# Patient Record
Sex: Male | Born: 1945 | Race: White | Hispanic: No | Marital: Married | Smoking: Never smoker
Health system: Southern US, Community
[De-identification: ages and names within clinical notes are randomized; demographics above are authoritative.]

## PROBLEM LIST (undated history)

## (undated) DIAGNOSIS — J189 Pneumonia, unspecified organism: Secondary | ICD-10-CM

## (undated) DIAGNOSIS — C4491 Basal cell carcinoma of skin, unspecified: Secondary | ICD-10-CM

## (undated) DIAGNOSIS — I5030 Unspecified diastolic (congestive) heart failure: Secondary | ICD-10-CM

## (undated) DIAGNOSIS — E785 Hyperlipidemia, unspecified: Secondary | ICD-10-CM

## (undated) DIAGNOSIS — I10 Essential (primary) hypertension: Secondary | ICD-10-CM

## (undated) DIAGNOSIS — L570 Actinic keratosis: Secondary | ICD-10-CM

## (undated) DIAGNOSIS — M1990 Unspecified osteoarthritis, unspecified site: Secondary | ICD-10-CM

## (undated) DIAGNOSIS — D126 Benign neoplasm of colon, unspecified: Secondary | ICD-10-CM

## (undated) DIAGNOSIS — J9691 Respiratory failure, unspecified with hypoxia: Secondary | ICD-10-CM

## (undated) DIAGNOSIS — R7303 Prediabetes: Secondary | ICD-10-CM

## (undated) DIAGNOSIS — J849 Interstitial pulmonary disease, unspecified: Secondary | ICD-10-CM

## (undated) DIAGNOSIS — I2510 Atherosclerotic heart disease of native coronary artery without angina pectoris: Secondary | ICD-10-CM

## (undated) HISTORY — DX: Hyperlipidemia, unspecified: E78.5

## (undated) HISTORY — PX: OTHER SURGICAL HISTORY: SHX169

## (undated) HISTORY — DX: Interstitial pulmonary disease, unspecified: J84.9

## (undated) HISTORY — PX: CARDIAC CATHETERIZATION: SHX172

## (undated) HISTORY — DX: Basal cell carcinoma of skin, unspecified: C44.91

## (undated) HISTORY — DX: Atherosclerotic heart disease of native coronary artery without angina pectoris: I25.10

## (undated) HISTORY — PX: CORONARY ARTERY BYPASS GRAFT: SHX141

## (undated) HISTORY — DX: Unspecified diastolic (congestive) heart failure: I50.30

## (undated) HISTORY — PX: APPENDECTOMY: SHX54

## (undated) HISTORY — DX: Actinic keratosis: L57.0

## (undated) HISTORY — PX: COLONOSCOPY: SHX174

---

## 2004-10-17 ENCOUNTER — Ambulatory Visit: Payer: Self-pay | Admitting: Unknown Physician Specialty

## 2008-11-29 ENCOUNTER — Ambulatory Visit: Payer: Self-pay | Admitting: Cardiology

## 2010-01-26 ENCOUNTER — Ambulatory Visit: Payer: Self-pay | Admitting: Unknown Physician Specialty

## 2010-03-13 ENCOUNTER — Ambulatory Visit: Payer: Self-pay | Admitting: Unknown Physician Specialty

## 2010-03-15 LAB — PATHOLOGY REPORT

## 2011-09-04 ENCOUNTER — Ambulatory Visit: Payer: Self-pay | Admitting: Internal Medicine

## 2011-09-20 ENCOUNTER — Ambulatory Visit: Payer: Self-pay | Admitting: General Surgery

## 2011-09-20 LAB — CBC WITH DIFFERENTIAL/PLATELET
Basophil #: 0.1 10*3/uL (ref 0.0–0.1)
Basophil %: 1 %
Eosinophil #: 0.2 10*3/uL (ref 0.0–0.7)
Eosinophil %: 4.4 %
HCT: 41.1 % (ref 40.0–52.0)
HGB: 14.4 g/dL (ref 13.0–18.0)
Lymphocyte #: 1.9 10*3/uL (ref 1.0–3.6)
Lymphocyte %: 36.2 %
MCH: 34.8 pg — ABNORMAL HIGH (ref 26.0–34.0)
MCHC: 35.2 g/dL (ref 32.0–36.0)
MCV: 99 fL (ref 80–100)
Monocyte #: 0.4 x10 3/mm (ref 0.2–1.0)
Monocyte %: 8.7 %
Neutrophil #: 2.6 10*3/uL (ref 1.4–6.5)
Neutrophil %: 49.7 %
Platelet: 135 10*3/uL — ABNORMAL LOW (ref 150–440)
RBC: 4.14 10*6/uL — ABNORMAL LOW (ref 4.40–5.90)
RDW: 12.9 % (ref 11.5–14.5)
WBC: 5.1 10*3/uL (ref 3.8–10.6)

## 2011-09-20 LAB — BASIC METABOLIC PANEL
Anion Gap: 7 (ref 7–16)
BUN: 12 mg/dL (ref 7–18)
Calcium, Total: 8.8 mg/dL (ref 8.5–10.1)
Chloride: 107 mmol/L (ref 98–107)
Co2: 28 mmol/L (ref 21–32)
Creatinine: 0.99 mg/dL (ref 0.60–1.30)
EGFR (African American): >60
EGFR (Non-African Amer.): >60
Glucose: 119 mg/dL — ABNORMAL HIGH (ref 65–99)
Osmolality: 284 (ref 275–301)
Potassium: 3.8 mmol/L (ref 3.5–5.1)
Sodium: 142 mmol/L (ref 136–145)

## 2011-09-24 ENCOUNTER — Ambulatory Visit: Payer: Self-pay | Admitting: General Surgery

## 2011-10-02 LAB — PATHOLOGY REPORT

## 2013-07-07 ENCOUNTER — Ambulatory Visit: Payer: Self-pay | Admitting: Unknown Physician Specialty

## 2013-07-10 LAB — PATHOLOGY REPORT

## 2014-06-27 DIAGNOSIS — Z85828 Personal history of other malignant neoplasm of skin: Secondary | ICD-10-CM | POA: Insufficient documentation

## 2014-09-04 NOTE — Op Note (Signed)
PATIENT NAME:  Garrett Peterson, Garrett Peterson MR#:  [id number] DATE OF BIRTH:  1945/06/29  DATE OF PROCEDURE:  09/24/2011  PREOPERATIVE DIAGNOSIS: Chronic cholecystitis and cholelithiasis.   POSTOPERATIVE DIAGNOSIS: Chronic cholecystitis and cholelithiasis   OPERATIVE PROCEDURE: Laparoscopic cholecystectomy with intraoperative cholangiograms.   SURGEON: Hervey Ard, MD    ANESTHESIA: General endotracheal.   ANESTHESIOLOGIST: Gunnar Bulla, MD   ESTIMATED BLOOD LOSS: Less than 5 mL.   CLINICAL NOTE: This 69 year old male has had symptomatic upper abdominal pain with ultrasound showing evidence of cholelithiasis. He was admitted for elective cholecystectomy.   OPERATIVE NOTE: With the patient under adequate general endotracheal anesthesia, the abdomen was prepped with ChloraPrep and draped. Hair had previously been removed with clippers. The patient was placed in Trendelenburg position. A transumbilical incision was made and a Veress needle placed. Although the hanging drop test was appropriate, initial insufflation with CO2 at 10 mmHg pressure showed insufflation in the preperitoneal space with distention of the lower abdomen but not of the upper abdomen. The needle was repositioned several times, always aspirating prior to instillation of CO2, and finally pneumoperitoneum was established. The patient was placed in reverse Trendelenburg position and rolled to the left. An 11 mm Xcel port was placed in the epigastrium and the camera moved to this location. There was no evidence of injury from initial port placement. Two 5-mm step ports were placed laterally. The gallbladder showed moderate inflammation with adhesions to the surrounding omentum. The gallbladder was placed on cephalad traction and the adhesions taken down with cautery dissection. There was a band of adhesions extending from the omentum across the inferior surface of the right lobe of the liver, and these were also taken down to remove the  weight of the omentum from the gallbladder. The liver was placed on cephalad traction with the gallbladder and the neck of the gallbladder exposed. A small hole in the gallbladder itself was made with the grasper, and this was controlled with a clip. The Kumar clamp was placed and fluoroscopic cholangiograms completed. The initial injection of 20 mL of one-half strength Conray 60 showed flow only into the common bile duct. The patient was rolled to the right and reinjected and showed good flow into the common hepatic duct. The distal common bile duct tapered normally, and a small amount of flow was noted into the duodenum. The cystic duct and cystic artery branches were doubly clipped and divided. The gallbladder was removed from the liver bed making use of hook cautery dissection. It was then placed through an EndoCatch bag for delivery through the umbilical port site. In spite of the fact that the EndoCatch bag had been brought through the port, when the bag was going to be retrieved the cord did not follow the same pathway the port did. It was necessary to place a Kelly through the defect, re-grab the cord on the EndoCatch bag, and then bring this through the abdominal wall. The gallbladder was sent to pathology in formalin for routine exam. Inspection from the epigastric port again showed no evidence of injury from initial port placement or gallbladder removal. Pneumoperitoneum was re-established. The right upper quadrant was irrigated with lactated Ringer's solution. Good hemostasis was noted. The abdomen was then desufflated and ports removed under direct vision. The skin incisions were closed with 4-0 Vicryl subcuticular sutures. Benzoin, Steri-Strips, Telfa, and Tegaderm dressings were applied.   The patient tolerated the procedure well and was taken to the recovery room in stable condition.  ____________________________ Forest Gleason.  Bary Castilla, MD jwb:cbb D: 09/24/2011 17:38:22 ET T: 09/24/2011 18:34:23  ET JOB#: [id number]  cc: Robert Bellow, MD, <Dictator> Leona Carry. Hall Busing, MD Larena Ohnemus Amedeo Kinsman MD ELECTRONICALLY SIGNED 09/25/2011 7:51

## 2015-01-09 ENCOUNTER — Ambulatory Visit (INDEPENDENT_AMBULATORY_CARE_PROVIDER_SITE_OTHER): Payer: Medicare Other

## 2015-01-09 ENCOUNTER — Ambulatory Visit (INDEPENDENT_AMBULATORY_CARE_PROVIDER_SITE_OTHER): Payer: Medicare Other | Admitting: Podiatry

## 2015-01-09 ENCOUNTER — Encounter: Payer: Self-pay | Admitting: Podiatry

## 2015-01-09 VITALS — BP 180/95 | HR 57 | Resp 16

## 2015-01-09 DIAGNOSIS — M79673 Pain in unspecified foot: Secondary | ICD-10-CM

## 2015-01-09 DIAGNOSIS — M76821 Posterior tibial tendinitis, right leg: Secondary | ICD-10-CM

## 2015-01-09 DIAGNOSIS — M7750 Other enthesopathy of unspecified foot: Secondary | ICD-10-CM | POA: Diagnosis not present

## 2015-01-09 DIAGNOSIS — M779 Enthesopathy, unspecified: Secondary | ICD-10-CM

## 2015-01-09 DIAGNOSIS — M778 Other enthesopathies, not elsewhere classified: Secondary | ICD-10-CM

## 2015-01-09 MED ORDER — MELOXICAM 15 MG PO TABS: 15.0000 mg | ORAL_TABLET | Freq: Every day | ORAL | Status: DC

## 2015-01-09 MED ORDER — METHYLPREDNISOLONE 4 MG PO TBPK: ORAL_TABLET | ORAL | Status: DC

## 2015-01-09 NOTE — Progress Notes (Signed)
   Subjective:    Patient ID: Garrett Peterson, male    DOB: 08-12-1945, 69 y.o.   MRN: [id number]  HPI: He presents today with a chief complaint of painful first metatarsophalangeal joints. He states this been going on for quite a while now and he is tolerated. He states that it just seems to be getting worse to the point where I can barely stand it any longer. He states that the pain is dull and aching in nature and he also has ankle pain most recently that he associates with pain in the big toe right foot. Denies any trauma to the foot.    Review of Systems  Cardiovascular: Positive for leg swelling.  Musculoskeletal: Positive for arthralgias.  All other systems reviewed and are negative.      Objective:   Physical Exam: 69 year old white male well-nourished in no acute distress presents vital signs are stable alert and oriented 3. Pulses are strongly palpable on neurologic sensorium is intact for [employer] monofilament. Deep tendon reflexes are intact bilateral and muscle strength +5 over 5 dorsiflexion plantar flexors and inverters everters all intrinsic musculature is intact. Orthopedic evaluation demonstrates all joints distal to the ankle have a full range of motion without crepitation. He does have some tenderness on range of motion of the first metatarsophalangeal joint of the right foot and some mild tenderness on inversion against resistance along the posterior tibial tendon of the right foot. He has no tenderness on palpation of the medial calcaneal tubercles bilateral. 3 views of radiograph taken today in the office demonstrate elongated first metatarsals with slightly narrowing of the joint space is consistent with early osteoarthritic changes. Cutaneous evaluation of a straight supple well-hydrated cutis no erythema edema cellulitis drainage or odor.         Assessment & Plan:  Assessment: Posterior tibial tendinitis right ankle secondary to accommodation from capsulitis  first metatarsophalangeal joint bilateral.  Plan: Discussed etiology pathology conservative versus surgical therapies. We discussed appropriate shoe gear stretching exercises ice therapy and history are modifications. After a sterile Betadine skin prep I injected 2 mg of dexamethasone with local and aesthetic into the first metatarsophalangeal joint bilaterally. I also started him on a Medrol Dosepak to be followed by meloxicam. I will follow-up with him in 1 month

## 2015-02-06 ENCOUNTER — Encounter: Payer: Self-pay | Admitting: Podiatry

## 2015-02-06 ENCOUNTER — Ambulatory Visit (INDEPENDENT_AMBULATORY_CARE_PROVIDER_SITE_OTHER): Payer: Medicare Other | Admitting: Podiatry

## 2015-02-06 VITALS — BP 166/85 | HR 68 | Resp 16

## 2015-02-06 DIAGNOSIS — M76821 Posterior tibial tendinitis, right leg: Secondary | ICD-10-CM | POA: Diagnosis not present

## 2015-02-06 DIAGNOSIS — M779 Enthesopathy, unspecified: Principal | ICD-10-CM

## 2015-02-06 DIAGNOSIS — M7750 Other enthesopathy of unspecified foot: Secondary | ICD-10-CM | POA: Diagnosis not present

## 2015-02-06 DIAGNOSIS — M778 Other enthesopathies, not elsewhere classified: Secondary | ICD-10-CM

## 2015-02-06 MED ORDER — DICLOFENAC SODIUM 75 MG PO TBEC: 75.0000 mg | DELAYED_RELEASE_TABLET | Freq: Two times a day (BID) | ORAL | Status: DC

## 2015-02-06 NOTE — Progress Notes (Signed)
He presents today with chief complaint of pain to the first metatarsophalangeal joint of the right foot greater than the left. He states that after the last injection lasted for 4-5 days but then it started to come back. He states that really bothers me when I'm driving or squatting. He is requesting another injection if possible.  Objective: Vital signs are stable he is alert and oriented 3 pulses are strongly palpable. Neurologic sensorium is intact. Deep tendon reflexes are brisk and equal bilateral and muscle strength is normal bilateral. Orthopedic evaluation does demonstrate pain on palpation and range of motion of the first metatarsophalangeal joint right foot over the left foot including the sesamoidal apparatus and the plantar structures. I reviewed his old radiographs particularly of the right foot today which does demonstrate early joint space narrowing consistent with very early osteoarthritic change.  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  Plan: Discussed the etiology pathology conservative versus surgical therapies. At this point I explained to him that we could inject another dose of Kenalog bilaterally. This was performed today after both of the first metatarsophalangeal joints were prepped with Betadine. He tolerated this procedure well and alleviated his symptoms prior to him leaving the office. I will follow-up with him in 1 month if necessary. We may need to consider an MRI.

## 2015-03-20 ENCOUNTER — Encounter: Payer: Self-pay | Admitting: Podiatry

## 2015-03-20 ENCOUNTER — Ambulatory Visit (INDEPENDENT_AMBULATORY_CARE_PROVIDER_SITE_OTHER): Payer: Medicare Other | Admitting: Podiatry

## 2015-03-20 VITALS — BP 164/88 | HR 66 | Resp 12

## 2015-03-20 DIAGNOSIS — G629 Polyneuropathy, unspecified: Secondary | ICD-10-CM

## 2015-03-20 MED ORDER — GABAPENTIN 100 MG PO CAPS: ORAL_CAPSULE | ORAL | Status: DC

## 2015-03-20 NOTE — Progress Notes (Signed)
He presents today stating that he has had some relief from the injections. He states this seems to help for the first to 3 weeks in the nothing at all. he states that the majority of the pain starts mid afternoon to late evening but does not prevent him from sleeping and does not wake him up. He states it is a dull ache no shooting pains no burning and tingling.  Objective: Pulses are strongly palpable neurologic sensorium appears to be intact per Semmes-Weinstein monofilament. Deep tendon reflexes are equal bilateral muscle strength is normal.  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  Plan: We will consider orthotics next visit however I started him on 100 mg of gabapentin twice daily and I will follow-up with him in 6 weeks.  Roselind Messier DPM

## 2015-05-01 ENCOUNTER — Ambulatory Visit: Payer: Medicare Other | Admitting: Podiatry

## 2018-07-15 DIAGNOSIS — I2510 Atherosclerotic heart disease of native coronary artery without angina pectoris: Secondary | ICD-10-CM | POA: Insufficient documentation

## 2018-07-15 DIAGNOSIS — E785 Hyperlipidemia, unspecified: Secondary | ICD-10-CM | POA: Insufficient documentation

## 2018-07-15 DIAGNOSIS — Z86010 Personal history of colonic polyps: Secondary | ICD-10-CM | POA: Insufficient documentation

## 2018-09-16 ENCOUNTER — Ambulatory Visit: Admit: 2018-09-16 | Payer: Self-pay | Admitting: Unknown Physician Specialty

## 2018-09-16 SURGERY — COLONOSCOPY WITH PROPOFOL
Anesthesia: General

## 2019-05-14 DIAGNOSIS — C801 Malignant (primary) neoplasm, unspecified: Secondary | ICD-10-CM

## 2019-05-14 HISTORY — DX: Malignant (primary) neoplasm, unspecified: C80.1

## 2019-09-03 ENCOUNTER — Other Ambulatory Visit: Payer: Self-pay | Admitting: Gastroenterology

## 2019-09-03 ENCOUNTER — Other Ambulatory Visit: Payer: Self-pay | Admitting: Internal Medicine

## 2019-09-03 ENCOUNTER — Encounter: Payer: Self-pay | Admitting: Oncology

## 2019-09-03 DIAGNOSIS — C187 Malignant neoplasm of sigmoid colon: Secondary | ICD-10-CM

## 2019-09-03 DIAGNOSIS — K6389 Other specified diseases of intestine: Secondary | ICD-10-CM

## 2019-09-03 NOTE — Progress Notes (Signed)
Patient prescreened for new pt appt. No concerns voiced.

## 2019-09-06 ENCOUNTER — Encounter (INDEPENDENT_AMBULATORY_CARE_PROVIDER_SITE_OTHER): Payer: Self-pay

## 2019-09-06 ENCOUNTER — Other Ambulatory Visit: Payer: Self-pay

## 2019-09-06 ENCOUNTER — Ambulatory Visit
Admission: RE | Admit: 2019-09-06 | Discharge: 2019-09-06 | Disposition: A | Discharge status: Home / Self Care | Payer: Medicare Other | Source: Ambulatory Visit | Attending: Gastroenterology | Admitting: Gastroenterology

## 2019-09-06 ENCOUNTER — Inpatient Hospital Stay: Payer: Medicare Other

## 2019-09-06 ENCOUNTER — Encounter: Payer: Self-pay | Admitting: Oncology

## 2019-09-06 ENCOUNTER — Inpatient Hospital Stay: Payer: Medicare Other | Attending: Oncology | Admitting: Oncology

## 2019-09-06 VITALS — BP 172/86 | HR 58 | Temp 96.2°F | Resp 18 | Ht 67.52 in | Wt 226.1 lb

## 2019-09-06 DIAGNOSIS — Z8719 Personal history of other diseases of the digestive system: Secondary | ICD-10-CM | POA: Diagnosis not present

## 2019-09-06 DIAGNOSIS — R918 Other nonspecific abnormal finding of lung field: Secondary | ICD-10-CM | POA: Insufficient documentation

## 2019-09-06 DIAGNOSIS — C187 Malignant neoplasm of sigmoid colon: Secondary | ICD-10-CM | POA: Diagnosis not present

## 2019-09-06 DIAGNOSIS — K5900 Constipation, unspecified: Secondary | ICD-10-CM

## 2019-09-06 DIAGNOSIS — N281 Cyst of kidney, acquired: Secondary | ICD-10-CM | POA: Diagnosis not present

## 2019-09-06 DIAGNOSIS — K6389 Other specified diseases of intestine: Secondary | ICD-10-CM

## 2019-09-06 LAB — CBC WITH DIFFERENTIAL/PLATELET
Abs Immature Granulocytes: 0.02 10*3/uL (ref 0.00–0.07)
Basophils Absolute: 0.1 10*3/uL (ref 0.0–0.1)
Basophils Relative: 1 %
Eosinophils Absolute: 0.4 10*3/uL (ref 0.0–0.5)
Eosinophils Relative: 5 %
HCT: 42.4 % (ref 39.0–52.0)
Hemoglobin: 14.9 g/dL (ref 13.0–17.0)
Immature Granulocytes: 0 %
Lymphocytes Relative: 25 %
Lymphs Abs: 2.1 10*3/uL (ref 0.7–4.0)
MCH: 33.5 pg (ref 26.0–34.0)
MCHC: 35.1 g/dL (ref 30.0–36.0)
MCV: 95.3 fL (ref 80.0–100.0)
Monocytes Absolute: 0.9 10*3/uL (ref 0.1–1.0)
Monocytes Relative: 10 %
Neutro Abs: 5 10*3/uL (ref 1.7–7.7)
Neutrophils Relative %: 59 %
Platelets: 194 10*3/uL (ref 150–400)
RBC: 4.45 MIL/uL (ref 4.22–5.81)
RDW: 12.1 % (ref 11.5–15.5)
WBC: 8.5 10*3/uL (ref 4.0–10.5)
nRBC: 0 % (ref 0.0–0.2)

## 2019-09-06 LAB — COMPREHENSIVE METABOLIC PANEL
ALT: 26 U/L (ref 0–44)
AST: 23 U/L (ref 15–41)
Albumin: 4.4 g/dL (ref 3.5–5.0)
Alkaline Phosphatase: 73 U/L (ref 38–126)
Anion gap: 10 (ref 5–15)
BUN: 12 mg/dL (ref 8–23)
CO2: 26 mmol/L (ref 22–32)
Calcium: 9.2 mg/dL (ref 8.9–10.3)
Chloride: 103 mmol/L (ref 98–111)
Creatinine, Ser: 1.1 mg/dL (ref 0.61–1.24)
GFR calc Af Amer: >60 mL/min (ref >60)
GFR calc non Af Amer: >60 mL/min (ref >60)
Glucose, Bld: 121 mg/dL — ABNORMAL HIGH (ref 70–99)
Potassium: 4.1 mmol/L (ref 3.5–5.1)
Sodium: 139 mmol/L (ref 135–145)
Total Bilirubin: 0.9 mg/dL (ref 0.3–1.2)
Total Protein: 8.2 g/dL — ABNORMAL HIGH (ref 6.5–8.1)

## 2019-09-06 LAB — IRON AND TIBC
Iron: 100 ug/dL (ref 45–182)
Saturation Ratios: 27 % (ref 17.9–39.5)
TIBC: 368 ug/dL (ref 250–450)
UIBC: 268 ug/dL

## 2019-09-06 LAB — FERRITIN: Ferritin: 198 ng/mL (ref 24–336)

## 2019-09-06 LAB — SURGICAL PATHOLOGY

## 2019-09-06 MED ORDER — IOHEXOL 300 MG/ML  SOLN
100.0000 mL | Freq: Once | INTRAMUSCULAR | Status: AC | PRN
  Administered 2019-09-06: 100 mL via INTRAVENOUS

## 2019-09-06 NOTE — Progress Notes (Signed)
.  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  male with PMH listed below was seen in consultation at the request of  Geanie Kenning, [place]*  for evaluation of sigmoid cancer  Patient recently had colonoscopy done by Dr. Alice Reichert for follow-up on history of colon polyps.  Patient also has reported recent history of change of bowel habits.  He has felt more constipated. No blood in the stool, abdominal pain, unintentional weight loss or fever or chills. Colonoscopy records is at [facility] and is not available to me. 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  MMR is deferred to resection specimen. .  Patient has already had a staging CT scan scheduled this afternoon.   Review of Systems  Constitutional: Negative for appetite change, chills, fatigue and fever.  HENT:   Negative for hearing loss and voice change.   Eyes: Negative for eye problems.  Respiratory: Negative for chest tightness and cough.   Cardiovascular: Negative for chest pain.  Gastrointestinal: Negative for abdominal distention, abdominal pain and blood in stool.  Endocrine: Negative for hot flashes.  Genitourinary: Negative for difficulty urinating and frequency.   Musculoskeletal: Negative for arthralgias.  Skin: Negative for itching and rash.  Neurological: Negative for extremity weakness.  Hematological: Negative for adenopathy.  Psychiatric/Behavioral: Negative for confusion.    MEDICAL HISTORY:  Past Medical History:  Diagnosis Date  . Basal cell  carcinoma   . CAD (coronary artery disease)   . Hyperlipidemia     SURGICAL HISTORY: Past Surgical History:  Procedure Laterality Date  . COLONOSCOPY    . open heart surgery      SOCIAL HISTORY: Social History   Socioeconomic History  . Marital status: Married    Spouse name: Not on file  . Number of children: Not on file  . Years of education: Not on file  . Highest education level: Not on file  Occupational History  . Not on file  Tobacco Use  . Smoking status: Never Smoker  . Smokeless tobacco: Never Used  Substance and Sexual Activity  . Alcohol use: Not Currently    Alcohol/week: 0.0 standard drinks  . Drug use: Never  . Sexual activity: Not Currently  Other Topics Concern  . Not on file  Social History Narrative  . Not on file   Social Determinants of Health   Financial Resource Strain:   . Difficulty of Paying Living Expenses:   Food Insecurity:   . Worried About Charity fundraiser in the Last Year:   . Arboriculturist in the Last Year:   Transportation Needs:   . Film/video editor (Medical):   [name] Kitchen Lack of Transportation (Non-Medical):   Physical Activity:   . Days of Exercise per Week:   . Minutes of Exercise per Session:   Stress:   . Feeling of Stress :   Social Connections:   . Frequency of Communication with Friends and Family:   . Frequency of Social Gatherings with Friends and Family:   . Attends Religious Services:   . Active Member of Clubs or Organizations:   .  Attends Archivist Meetings:   [name] Kitchen Marital Status:   Intimate Partner Violence:   . Fear of Current or Ex-Partner:   . Emotionally Abused:   [name] Kitchen Physically Abused:   . Sexually Abused:     FAMILY HISTORY: Family History  Problem Relation Age of Onset  . Lung disease Father     ALLERGIES:  has no active allergies.  MEDICATIONS:  Current Outpatient Medications  Medication Sig Dispense Refill  . atorvastatin (LIPITOR) 20 MG tablet Take by mouth.    . temazepam  (RESTORIL) 30 MG capsule     . VITAMIN E PO Take by mouth.     No current facility-administered medications for this visit.     PHYSICAL EXAMINATION: ECOG PERFORMANCE STATUS: 0 - Asymptomatic Vitals:   09/06/19 1107  BP: (!) 172/86  Pulse: (!) 58  Resp: 18  Temp: (!) 96.2 F (35.7 C)   Filed Weights   09/06/19 1107  Weight: 226 lb 1.6 oz (102.6 kg)    Physical Exam Constitutional:      General: He is not in acute distress. HENT:     Head: Normocephalic and atraumatic.  Eyes:     General: No scleral icterus. Cardiovascular:     Rate and Rhythm: Normal rate and regular rhythm.     Heart sounds: Normal heart sounds.  Pulmonary:     Effort: Pulmonary effort is normal. No respiratory distress.     Breath sounds: No wheezing.  Abdominal:     General: Bowel sounds are normal. There is no distension.     Palpations: Abdomen is soft.  Musculoskeletal:        General: No deformity. Normal range of motion.     Cervical back: Normal range of motion and neck supple.  Skin:    General: Skin is warm and dry.     Findings: No erythema or rash.  Neurological:     Mental Status: He is alert and oriented to person, place, and time. Mental status is at baseline.     Cranial Nerves: No cranial nerve deficit.     Coordination: Coordination normal.  Psychiatric:        Mood and Affect: Mood normal.     LABORATORY DATA:  I have reviewed the data as listed Lab Results  Component Value Date   WBC 8.5 09/06/2019   HGB 14.9 09/06/2019   HCT 42.4 09/06/2019   MCV 95.3 09/06/2019   PLT 194 09/06/2019   Recent Labs    09/06/19 1200  NA 139  K 4.1  CL 103  CO2 26  GLUCOSE 121*  BUN 12  CREATININE 1.10  CALCIUM 9.2  GFRNONAA >60  GFRAA >60  PROT 8.2*  ALBUMIN 4.4  AST 23  ALT 26  ALKPHOS 73  BILITOT 0.9   Iron/TIBC/Ferritin/ %Sat    Component Value Date/Time   IRON 100 09/06/2019 1200   TIBC 368 09/06/2019 1200   FERRITIN 198 09/06/2019 1200   IRONPCTSAT 27  09/06/2019 1200      RADIOGRAPHIC STUDIES: I have personally reviewed the radiological images as listed and agreed with the findings in the report. No results found.    ASSESSMENT & PLAN:  1. 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 Renal cyst    Distal sigmoid colon mass, biopsy showed moderate differentiated adenocarcinoma. Patient has an appointment to see surgery Dr. Peyton Najjar. given the position of this mass, differential diagnosis is sigmoid colon cancer versus rectal cancer. Discussed with surgery Dr. Peyton Najjar  who discussed with GI Dr. Alice Reichert who confirmed that mass is in distal sigmoid. CT was independently reviewed by me and discussed with patient.  Mass was not apparent on CT.-No distant metastasis.  Bilateral lower lung zone predominant peripheral reticular and nodular densities progressed from 2011.  May reflect interstitial lung disease. Hyperdense exophytic lesion arising from the lower lobe of left kidney is noted.  This is increasing density which may represent a hemorrhagic or proteinaceous serous cyst.  Consider more definitive characterization with renal protocol MRI with and without contrast material.  Recommend surgery and I will go over final pathology with patient.  Adjuvant plan pending final staging  check CBC, CMP, CEA.  Iron, TIBC ferritin. Patient is interested in participating in clinical trial.  Refer to exact science clinical study.  -Renal cyst, will check MRI abdomen with and without contrast for further evaluation renal cyst.  Orders Placed This Encounter  Procedures  . Iron and TIBC    Standing Status:   Future    Number of Occurrences:   1    Standing Expiration Date:   03/07/2021  . Ferritin    Standing Status:   Future    Number of Occurrences:   1    Standing Expiration Date:   03/07/2021  . CBC with Differential/Platelet    Standing Status:   Future    Number of Occurrences:   1    Standing Expiration Date:   03/07/2021  . Comprehensive metabolic  panel    Standing Status:   Future    Number of Occurrences:   1    Standing Expiration Date:   03/07/2021  . CEA    Standing Status:   Future    Number of Occurrences:   1    Standing Expiration Date:   09/05/2020    All questions were answered. The patient knows to call the clinic with any problems questions or concerns.  cc Geanie Kenning, [place]*    Return of visit: After surgery. Thank you for this kind referral and the opportunity to participate in the care of this patient. A copy of today's note is routed to referring provider    Earlie Server, MD, PhD Hematology Oncology [facility] Pager- [phone] 09/06/2019

## 2019-09-07 ENCOUNTER — Ambulatory Visit: Payer: Self-pay | Admitting: General Surgery

## 2019-09-07 LAB — CEA: CEA: 4.8 ng/mL — ABNORMAL HIGH (ref 0.0–4.7)

## 2019-09-07 NOTE — H&P (View-Only) (Signed)
PATIENT PROFILE: Garrett Peterson is a 74 y.o. male who presents to the Clinic for consultation at the request of Dr. 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  PCP:  Albina Billet, MD  HISTORY OF PRESENT ILLNESS: Garrett Peterson reports he had he is usual screening colonoscopy and he was found with a mass in his distal sigmoid.  Patient has history of colon polyps removed before and he used to have colonoscopy every 5 years.  He reports that he is colonoscopy was supposed to be in May 2020 but due to Covid he was delayed until this year.  The patient reports having changes in his bowel habits from constipation to more urgency and frequent stools.  He reports that the caliber of the stool has been decreasing slowly.  He reports episodic rectal bleeding.  He denies abdominal pain.  There is no pain radiation.  There is no alleviating or aggravating factor.  Patient reports having an uncle with history of colon cancer.  No other closer family member with colon cancer.   PROBLEM LIST:        Problem List  Date Reviewed: 07/15/2018       Noted   Hx of adenomatous colonic polyps 07/15/2018   Hyperlipidemia 07/15/2018   CAD (coronary artery disease) 07/15/2018   Overview    coronary bypass grafting per Dr Clenton Pare on 12/01/08 with placement of a left internal mammary artery to the LAD, saphenous vein graft to the posterior descending artery, ramus intermedius, first obtuse marginal.       Personal history of other malignant neoplasm of skin 06/27/2014      GENERAL REVIEW OF SYSTEMS:   General ROS: negative for - chills, fatigue, fever, weight gain or weight loss Allergy and Immunology ROS: negative for - hives  Hematological and Lymphatic ROS: negative for - bleeding problems or bruising, negative for palpable nodes Endocrine ROS: negative for - heat or cold intolerance, hair changes Respiratory ROS: negative for - cough, shortness of breath or wheezing Cardiovascular  ROS: no chest pain or palpitations GI ROS: negative for nausea, vomiting, abdominal pain.  Positive for diarrhea, constipation Musculoskeletal ROS: negative for - joint swelling or muscle pain Neurological ROS: negative for - confusion, syncope Dermatological ROS: negative for pruritus and rash Psychiatric: negative for anxiety, depression, difficulty sleeping and memory loss  MEDICATIONS: Current Medications        Current Outpatient Medications  Medication Sig Dispense Refill  . atorvastatin (LIPITOR) 20 MG tablet Take 20 mg by mouth once daily.    . temazepam (RESTORIL) 30 mg capsule Take 30 mg by mouth nightly as needed for Sleep    . metroNIDAZOLE (FLAGYL) 500 MG tablet Take 2 tablets at 2 pm, 3 pm and 10 pm the day before surgery. 6 tablet 0  . neomycin 500 mg tablet Take 2 tablets at 2 pm, 3 pm and 10 pm the day before surgery. 6 tablet 0   No current facility-administered medications for this visit.      ALLERGIES: Patient has no known allergies.  PAST MEDICAL HISTORY:     Past Medical History:  Diagnosis Date  . Hx of adenomatous colonic polyps 07/15/2018  . Hyperlipidemia 07/15/2018    PAST SURGICAL HISTORY:      Past Surgical History:  Procedure Laterality Date  . APPENDECTOMY     around 2006 --- @ [facility] - Dr Bary Castilla  . COLONOSCOPY  03/13/2010   Adenomatous Polyp  . COLONOSCOPY  07/07/2013  Adenomatous Polyp: [facility] 06/2018: Recall ltr mailed   . COLONOSCOPY  09/03/2019   Adenocarcinoma, moderately differentiated  . OPEN HEART SURGERY      December 01 2008     FAMILY HISTORY:      Family History  Problem Relation Age of Onset  . Lung cancer Mother   . Broken bones Father      SOCIAL HISTORY: Social History          Socioeconomic History  . Marital status: Married    Spouse name: Not on file  . Number of children: Not on file  . Years of education: Not on file  . Highest education level: Not on file  Occupational History   . Not on file  Tobacco Use  . Smoking status: Never Smoker  . Smokeless tobacco: Never Used  Vaping Use  . Vaping Use: Never assessed  Substance and Sexual Activity  . Alcohol use: Yes    Alcohol/week: 0.0 standard drinks  . Drug use: Never  . Sexual activity: Defer    Partners: Female  Other Topics Concern  . Not on file  Social History Narrative  . Not on file   Social Determinants of Health      Financial Resource Strain:   . Difficulty of Paying Living Expenses:   Food Insecurity:   . Worried About Charity fundraiser in the Last Year:   . Arboriculturist in the Last Year:   Transportation Needs:   . Film/video editor (Medical):   [name] Kitchen Lack of Transportation (Non-Medical):       PHYSICAL EXAM: Vitals:   09/07/19 0926  BP: 172/86  Pulse: 70   Body mass index is 34.06 kg/m. Weight: (!) 101.6 kg (224 lb)   GENERAL: Alert, active, oriented x3  HEENT: Pupils equal reactive to light. Extraocular movements are intact. Sclera clear. Palpebral conjunctiva normal red color.Pharynx clear.  NECK: Supple with no palpable mass and no adenopathy.  LUNGS: Sound clear with no rales rhonchi or wheezes.  HEART: Regular rhythm S1 and S2 without murmur.  ABDOMEN: Soft and depressible, nontender with no palpable mass, no hepatomegaly.   RECTAL: There is no palpable mass on digital rectal exam, adequate sphincter tone.  There is small hemorrhoids.  There is no polyp.  EXTREMITIES: Well-developed well-nourished symmetrical with no dependent edema.  NEUROLOGICAL: Awake alert oriented, facial expression symmetrical, moving all extremities.  REVIEW OF DATA: I have reviewed the following data today:      Ancillary Orders on 08/31/2019  Component Date Value  . SARS-COV-2, NAA - [facility] 08/31/2019 Not Detected   . SARS-CoV-2, NAA 2 DAY TA* 08/31/2019 Performed      ASSESSMENT: Garrett Peterson is a 74 y.o. male presenting for consultation for sigmoid  colon cancer.  Patient with colonoscopy found with a distal sigmoid mass.  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  Patient has CT scan of the chest abdominal pelvis.  There is no suspicion of any distant malignancy.  I personally discussed the images with radiologist due to the concern of lower rectal mass but they did not see any masses in the rectum.  Final report by another radiologist also unable to identify any mass in the rectum or even in the sigmoid colon.  It was also reported that patient has a left renal cyst that looks hemorrhagic versus proteinaceous.  I discussed the images with an urologist (Dr. Bernardo Heater) and it was assessed as a simple cyst that  has no change since last CT scan of 2011.  I also evaluated the CT scan from 2011.  Patient was evaluated yesterday by Dr. Tasia Catchings (medical oncology).  I evaluated the labs.  Patient has normal hemoglobin, normal renal function.  CEA minimally elevated.  I discussed with the patient the surgical management of sigmoid colon mass.  These include minimal invasive partial colectomy with anastomosis.  I discussed with the patient that since it is in the distal sigmoid a low pelvic anastomosis may need to be done.  I discussed with the patient the goal of surgery is to remove the portion of the sigmoid colon and rectum with adequate margins and doing a lymphadenectomy for staging.  I discussed with the patient the risk of surgery that includes: Infection, bleeding, injury to intestine, injury to ureter, injury to bladder, anastomosis leak, intra-abdominal abscess, bowel obstruction, risk of anesthesia among others.  Patient has history of coronary artery disease s/p CABG in 2010.  Patient has been recovering well.  Patient reports that he was told that he does not even need follow-up with cardiology.  Patient denies any chest pain or shortness of breath.  Patient able to meet 4 METS.  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  Robotic assisted partial colectomy with low pelvic anastomosis (93112, 44213)) 2.  Bowel prep the day before surgery as instructed. 3.  CBC, CMP done 4.  Internal medicine clearance 5.  We will discuss with urology regarding the need of MRI prior to surgery for evaluation of renal cyst.  I will call you with the recommendations. 6.  Do not take aspirin or any blood thinner 5 days before the surgery 7.  Contact us at any time if you have any concern.  Patient verbalized understanding, all questions were answered, and were agreeable with the plan outlined above.   I spent a total of 60 minutes in both face-to-face and non-face-to-face activities for this visit on the date of this encounter.  Herbert Pun, MD  Electronically signed by Herbert Pun, MD

## 2019-09-07 NOTE — H&P (Signed)
PATIENT PROFILE: Garrett Peterson is a 74 y.o. male who presents to the Clinic for consultation at the request of Dr. 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  PCP:  Albina Billet, MD  HISTORY OF PRESENT ILLNESS: Garrett Peterson reports he had he is usual screening colonoscopy and he was found with a mass in his distal sigmoid.  Patient has history of colon polyps removed before and he used to have colonoscopy every 5 years.  He reports that he is colonoscopy was supposed to be in May 2020 but due to Covid he was delayed until this year.  The patient reports having changes in his bowel habits from constipation to more urgency and frequent stools.  He reports that the caliber of the stool has been decreasing slowly.  He reports episodic rectal bleeding.  He denies abdominal pain.  There is no pain radiation.  There is no alleviating or aggravating factor.  Patient reports having an uncle with history of colon cancer.  No other closer family member with colon cancer.   PROBLEM LIST:        Problem List  Date Reviewed: 07/15/2018       Noted   Hx of adenomatous colonic polyps 07/15/2018   Hyperlipidemia 07/15/2018   CAD (coronary artery disease) 07/15/2018   Overview    coronary bypass grafting per Dr Clenton Pare on 12/01/08 with placement of a left internal mammary artery to the LAD, saphenous vein graft to the posterior descending artery, ramus intermedius, first obtuse marginal.       Personal history of other malignant neoplasm of skin 06/27/2014      GENERAL REVIEW OF SYSTEMS:   General ROS: negative for - chills, fatigue, fever, weight gain or weight loss Allergy and Immunology ROS: negative for - hives  Hematological and Lymphatic ROS: negative for - bleeding problems or bruising, negative for palpable nodes Endocrine ROS: negative for - heat or cold intolerance, hair changes Respiratory ROS: negative for - cough, shortness of breath or wheezing Cardiovascular  ROS: no chest pain or palpitations GI ROS: negative for nausea, vomiting, abdominal pain.  Positive for diarrhea, constipation Musculoskeletal ROS: negative for - joint swelling or muscle pain Neurological ROS: negative for - confusion, syncope Dermatological ROS: negative for pruritus and rash Psychiatric: negative for anxiety, depression, difficulty sleeping and memory loss  MEDICATIONS: Current Medications        Current Outpatient Medications  Medication Sig Dispense Refill  . atorvastatin (LIPITOR) 20 MG tablet Take 20 mg by mouth once daily.    . temazepam (RESTORIL) 30 mg capsule Take 30 mg by mouth nightly as needed for Sleep    . metroNIDAZOLE (FLAGYL) 500 MG tablet Take 2 tablets at 2 pm, 3 pm and 10 pm the day before surgery. 6 tablet 0  . neomycin 500 mg tablet Take 2 tablets at 2 pm, 3 pm and 10 pm the day before surgery. 6 tablet 0   No current facility-administered medications for this visit.      ALLERGIES: Patient has no known allergies.  PAST MEDICAL HISTORY:     Past Medical History:  Diagnosis Date  . Hx of adenomatous colonic polyps 07/15/2018  . Hyperlipidemia 07/15/2018    PAST SURGICAL HISTORY:      Past Surgical History:  Procedure Laterality Date  . APPENDECTOMY     around 2006 --- @ [facility] - Dr Bary Castilla  . COLONOSCOPY  03/13/2010   Adenomatous Polyp  . COLONOSCOPY  07/07/2013  Adenomatous Polyp: [facility] 06/2018: Recall ltr mailed   . COLONOSCOPY  09/03/2019   Adenocarcinoma, moderately differentiated  . OPEN HEART SURGERY      December 01 2008     FAMILY HISTORY:      Family History  Problem Relation Age of Onset  . Lung cancer Mother   . Broken bones Father      SOCIAL HISTORY: Social History          Socioeconomic History  . Marital status: Married    Spouse name: Not on file  . Number of children: Not on file  . Years of education: Not on file  . Highest education level: Not on file  Occupational History   . Not on file  Tobacco Use  . Smoking status: Never Smoker  . Smokeless tobacco: Never Used  Vaping Use  . Vaping Use: Never assessed  Substance and Sexual Activity  . Alcohol use: Yes    Alcohol/week: 0.0 standard drinks  . Drug use: Never  . Sexual activity: Defer    Partners: Female  Other Topics Concern  . Not on file  Social History Narrative  . Not on file   Social Determinants of Health      Financial Resource Strain:   . Difficulty of Paying Living Expenses:   Food Insecurity:   . Worried About Charity fundraiser in the Last Year:   . Arboriculturist in the Last Year:   Transportation Needs:   . Film/video editor (Medical):   [name] Kitchen Lack of Transportation (Non-Medical):       PHYSICAL EXAM: Vitals:   09/07/19 0926  BP: 172/86  Pulse: 70   Body mass index is 34.06 kg/m. Weight: (!) 101.6 kg (224 lb)   GENERAL: Alert, active, oriented x3  HEENT: Pupils equal reactive to light. Extraocular movements are intact. Sclera clear. Palpebral conjunctiva normal red color.Pharynx clear.  NECK: Supple with no palpable mass and no adenopathy.  LUNGS: Sound clear with no rales rhonchi or wheezes.  HEART: Regular rhythm S1 and S2 without murmur.  ABDOMEN: Soft and depressible, nontender with no palpable mass, no hepatomegaly.   RECTAL: There is no palpable mass on digital rectal exam, adequate sphincter tone.  There is small hemorrhoids.  There is no polyp.  EXTREMITIES: Well-developed well-nourished symmetrical with no dependent edema.  NEUROLOGICAL: Awake alert oriented, facial expression symmetrical, moving all extremities.  REVIEW OF DATA: I have reviewed the following data today:      Ancillary Orders on 08/31/2019  Component Date Value  . SARS-COV-2, NAA - [facility] 08/31/2019 Not Detected   . SARS-CoV-2, NAA 2 DAY TA* 08/31/2019 Performed      ASSESSMENT: Garrett Peterson is a 74 y.o. male presenting for consultation for sigmoid  colon cancer.  Patient with colonoscopy found with a distal sigmoid mass.  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  Patient has CT scan of the chest abdominal pelvis.  There is no suspicion of any distant malignancy.  I personally discussed the images with radiologist due to the concern of lower rectal mass but they did not see any masses in the rectum.  Final report by another radiologist also unable to identify any mass in the rectum or even in the sigmoid colon.  It was also reported that patient has a left renal cyst that looks hemorrhagic versus proteinaceous.  I discussed the images with an urologist (Dr. Bernardo Heater) and it was assessed as a simple cyst that  has no change since last CT scan of 2011.  I also evaluated the CT scan from 2011.  Patient was evaluated yesterday by Dr. Tasia Catchings (medical oncology).  I evaluated the labs.  Patient has normal hemoglobin, normal renal function.  CEA minimally elevated.  I discussed with the patient the surgical management of sigmoid colon mass.  These include minimal invasive partial colectomy with anastomosis.  I discussed with the patient that since it is in the distal sigmoid a low pelvic anastomosis may need to be done.  I discussed with the patient the goal of surgery is to remove the portion of the sigmoid colon and rectum with adequate margins and doing a lymphadenectomy for staging.  I discussed with the patient the risk of surgery that includes: Infection, bleeding, injury to intestine, injury to ureter, injury to bladder, anastomosis leak, intra-abdominal abscess, bowel obstruction, risk of anesthesia among others.  Patient has history of coronary artery disease s/p CABG in 2010.  Patient has been recovering well.  Patient reports that he was told that he does not even need follow-up with cardiology.  Patient denies any chest pain or shortness of breath.  Patient able to meet 4 METS.  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  Robotic assisted partial colectomy with low pelvic anastomosis (93112, 44213)) 2.  Bowel prep the day before surgery as instructed. 3.  CBC, CMP done 4.  Internal medicine clearance 5.  We will discuss with urology regarding the need of MRI prior to surgery for evaluation of renal cyst.  I will call you with the recommendations. 6.  Do not take aspirin or any blood thinner 5 days before the surgery 7.  Contact us at any time if you have any concern.  Patient verbalized understanding, all questions were answered, and were agreeable with the plan outlined above.   I spent a total of 60 minutes in both face-to-face and non-face-to-face activities for this visit on the date of this encounter.  Herbert Pun, MD  Electronically signed by Herbert Pun, MD

## 2019-09-08 ENCOUNTER — Telehealth: Payer: Self-pay | Admitting: *Deleted

## 2019-09-08 DIAGNOSIS — C187 Malignant neoplasm of sigmoid colon: Secondary | ICD-10-CM

## 2019-09-08 NOTE — Telephone Encounter (Signed)
Per MD 09/08/19 staff message [id number] MD visit 1 week after surgery. Appt was scheduled as requested. Called pt and left a detailed message on his vmail to making him aware of his scheduled MD visit. Appt reminder letter will be mailed out as well.

## 2019-09-08 NOTE — Research (Addendum)
Dr. Tasia Catchings referred patient for research study "Blood Sample Collection to Evaluate Biomarkers in Subjects with untreated Solid Tumors" sponsored by [employer].  Dr. Tasia Catchings explained to patient that study requires blood sample to be given for research purposes and the blood has to be drawn before treatment (including surgery).  Patient will not return to cancer center before his surgery on 09/20/2019, but was agreeable to come to cancer center just for blood draw for study. I called patient and 336 [phone] and left message for him to call me to set up appointment for consent and blood draw.  [facility] 09/08/2019 11:40 am  Patient returned my call and I further explained the study including voluntary enrollment, requirement of study to give 5 vials of blood and risks and benefits.  Patient agreeable to participate.  I explained to him that since was not coming back to cancer center before his surgery on 09/20/2019, if he desired to participate, he would have to make an additional trip to the cancer center for study participation.  Patient understands and is agreeable.  He has requested to come to cancer center Monday, May 3 at 8:00.  I have made a request with scheduling to schedule a lab appointment at 8:15 and I will meet with patient at 8:00 to get consent form signed.   [place] Associate 09/08/2019 2:55 pm  Patient presented to cancer center this morning for consent and blood draw for [employer] blood study.  Consent form (version 3.0 date 03/10/2018) including risks and benefits, voluntary nature of research trial, trial requirements (giving of 5 vials of blood) and completion of [employer] history worksheet reviewed with patient.  Patient has no questions and was agreeable to participate.  Patient signed consent and copy of signed consent given to patient.  We proceeded to lab where lab staff drew research labs.  Patient  was given gift card provided by study. I thanked him for study participation.  Research labs to be shipped to [employer] today.  [facility] Associate 09/13/2019 09:28 am

## 2019-09-10 ENCOUNTER — Encounter
Admission: RE | Admit: 2019-09-10 | Discharge: 2019-09-10 | Disposition: A | Discharge status: Home / Self Care | Payer: Medicare Other | Source: Ambulatory Visit | Attending: General Surgery | Admitting: General Surgery

## 2019-09-10 ENCOUNTER — Other Ambulatory Visit: Payer: Self-pay

## 2019-09-10 HISTORY — DX: Unspecified osteoarthritis, unspecified site: M19.90

## 2019-09-10 HISTORY — DX: Prediabetes: R73.03

## 2019-09-10 NOTE — Patient Instructions (Signed)
Your procedure is scheduled on: 09/20/19 Report to [facility]. To find out your arrival time please call [phone] between 1PM - 3PM on 09/17/19.  Remember: Instructions that are not followed completely may result in serious medical risk, up to and including death, or upon the discretion of your surgeon and anesthesiologist your surgery may need to be rescheduled.     _X__ 1. DIET AS INSTRUCTED BY DR Peyton Najjar DIAZ  __X__2.  On the morning of surgery brush your teeth with toothpaste and water, you                 may rinse your mouth with mouthwash if you wish.  Do not swallow any              toothpaste of mouthwash.     _X__ 3.  No Alcohol for 24 hours before or after surgery.   _X__ 4.  Do Not Smoke or use e-cigarettes For 24 Hours Prior to Your Surgery.                 Do not use any chewable tobacco products for at least 6 hours prior to                 surgery.  ____  5.  Bring all medications with you on the day of surgery if instructed.   __X__  6.  Notify your doctor if there is any change in your medical condition      (cold, fever, infections).     Do not wear jewelry, make-up, hairpins, clips or nail polish. Do not wear lotions, powders, or perfumes.  Do not shave 48 hours prior to surgery. Men may shave face and neck. Do not bring valuables to the hospital.    [facility] is not responsible for any belongings or valuables.  Contacts, dentures/partials or body piercings may not be worn into surgery. Bring a case for your contacts, glasses or hearing aids, a denture cup will be supplied. Leave your suitcase in the car. After surgery it may be brought to your room. For patients admitted to the hospital, discharge time is determined by your treatment team.   Patients discharged the day of surgery will not be allowed to drive home.   Please read over the following fact sheets that you were given:   MRSA  Information  __X__ Take these medicines the morning of surgery with A SIP OF WATER:    1. none  2.   3.   4.  5.  6.  ____ Fleet Enema (as directed)   __X__ Use CHG Soap/SAGE wipes as directed  ____ Use inhalers on the day of surgery  ____ Stop metformin/Janumet/Farxiga 2 days prior to surgery    ____ Take 1/2 of usual insulin dose the night before surgery. No insulin the morning          of surgery.   ____ Stop Blood Thinners Coumadin/Plavix/Xarelto/Pleta/Pradaxa/Eliquis/Effient/Aspirin  on   Or contact your Surgeon, Cardiologist or Medical Doctor regarding  ability to stop your blood thinners  __X__ Stop Anti-inflammatories 7 days before surgery such as Advil, Ibuprofen, Motrin,  BC or Goodies Powder, Naprosyn, Naproxen, Aleve, Aspirin    __X__ Stop all herbal supplements, fish oil or vitamin E until after surgery.    ____ Bring C-Pap to the hospital.

## 2019-09-11 HISTORY — PX: ROBOT ASSISTED LAPAROSCOPIC PARTIAL COLECTOMY: SHX6476

## 2019-09-12 ENCOUNTER — Encounter: Payer: Self-pay | Admitting: Oncology

## 2019-09-12 DIAGNOSIS — Z7189 Other specified counseling: Secondary | ICD-10-CM | POA: Insufficient documentation

## 2019-09-13 ENCOUNTER — Encounter
Admission: RE | Admit: 2019-09-13 | Discharge: 2019-09-13 | Disposition: A | Discharge status: Home / Self Care | Payer: Medicare Other | Source: Ambulatory Visit | Attending: General Surgery | Admitting: General Surgery

## 2019-09-13 ENCOUNTER — Other Ambulatory Visit: Payer: Self-pay

## 2019-09-13 ENCOUNTER — Inpatient Hospital Stay: Payer: Medicare Other | Attending: Oncology

## 2019-09-13 DIAGNOSIS — Z951 Presence of aortocoronary bypass graft: Secondary | ICD-10-CM | POA: Insufficient documentation

## 2019-09-13 DIAGNOSIS — N281 Cyst of kidney, acquired: Secondary | ICD-10-CM | POA: Insufficient documentation

## 2019-09-13 DIAGNOSIS — C20 Malignant neoplasm of rectum: Secondary | ICD-10-CM | POA: Insufficient documentation

## 2019-09-13 DIAGNOSIS — Z01810 Encounter for preprocedural cardiovascular examination: Secondary | ICD-10-CM | POA: Diagnosis present

## 2019-09-13 DIAGNOSIS — Z85828 Personal history of other malignant neoplasm of skin: Secondary | ICD-10-CM | POA: Insufficient documentation

## 2019-09-13 DIAGNOSIS — J984 Other disorders of lung: Secondary | ICD-10-CM | POA: Insufficient documentation

## 2019-09-16 ENCOUNTER — Other Ambulatory Visit
Admission: RE | Admit: 2019-09-16 | Discharge: 2019-09-16 | Disposition: A | Discharge status: Home / Self Care | Payer: Medicare Other | Source: Ambulatory Visit | Attending: General Surgery | Admitting: General Surgery

## 2019-09-16 ENCOUNTER — Other Ambulatory Visit: Payer: Self-pay

## 2019-09-16 DIAGNOSIS — Z20822 Contact with and (suspected) exposure to covid-19: Secondary | ICD-10-CM | POA: Insufficient documentation

## 2019-09-16 DIAGNOSIS — Z01812 Encounter for preprocedural laboratory examination: Secondary | ICD-10-CM | POA: Insufficient documentation

## 2019-09-16 LAB — SARS CORONAVIRUS 2 (TAT 6-24 HRS): SARS Coronavirus 2: NEGATIVE

## 2019-09-19 MED ORDER — SODIUM CHLORIDE 0.9 % IV SOLN
2.0000 g | INTRAVENOUS | Status: AC
  Administered 2019-09-20 (×2): 2 g via INTRAVENOUS

## 2019-09-20 ENCOUNTER — Inpatient Hospital Stay: Payer: Medicare Other | Admitting: Certified Registered"

## 2019-09-20 ENCOUNTER — Encounter: Payer: Self-pay | Admitting: General Surgery

## 2019-09-20 ENCOUNTER — Other Ambulatory Visit: Payer: Self-pay

## 2019-09-20 ENCOUNTER — Inpatient Hospital Stay
Admission: RE | Admit: 2019-09-20 | Discharge: 2019-09-24 | DRG: 331 | Disposition: A | Discharge status: Home / Self Care | Payer: Medicare Other | Attending: General Surgery | Admitting: General Surgery

## 2019-09-20 ENCOUNTER — Encounter
Admission: RE | Disposition: A | Discharge status: Home / Self Care | Payer: Self-pay | Source: Home / Self Care | Attending: General Surgery

## 2019-09-20 DIAGNOSIS — E785 Hyperlipidemia, unspecified: Secondary | ICD-10-CM | POA: Diagnosis present

## 2019-09-20 DIAGNOSIS — Z800 Family history of malignant neoplasm of digestive organs: Secondary | ICD-10-CM | POA: Diagnosis not present

## 2019-09-20 DIAGNOSIS — Z86010 Personal history of colonic polyps: Secondary | ICD-10-CM

## 2019-09-20 DIAGNOSIS — K5900 Constipation, unspecified: Secondary | ICD-10-CM | POA: Diagnosis present

## 2019-09-20 DIAGNOSIS — K639 Disease of intestine, unspecified: Secondary | ICD-10-CM | POA: Diagnosis present

## 2019-09-20 DIAGNOSIS — I2510 Atherosclerotic heart disease of native coronary artery without angina pectoris: Secondary | ICD-10-CM | POA: Diagnosis present

## 2019-09-20 DIAGNOSIS — C19 Malignant neoplasm of rectosigmoid junction: Principal | ICD-10-CM | POA: Diagnosis present

## 2019-09-20 DIAGNOSIS — Z951 Presence of aortocoronary bypass graft: Secondary | ICD-10-CM | POA: Diagnosis not present

## 2019-09-20 DIAGNOSIS — K660 Peritoneal adhesions (postprocedural) (postinfection): Secondary | ICD-10-CM | POA: Diagnosis present

## 2019-09-20 DIAGNOSIS — Z85828 Personal history of other malignant neoplasm of skin: Secondary | ICD-10-CM | POA: Diagnosis not present

## 2019-09-20 DIAGNOSIS — Z79899 Other long term (current) drug therapy: Secondary | ICD-10-CM

## 2019-09-20 DIAGNOSIS — C189 Malignant neoplasm of colon, unspecified: Secondary | ICD-10-CM | POA: Diagnosis present

## 2019-09-20 LAB — ABO/RH: ABO/RH(D): O POS

## 2019-09-20 LAB — TYPE AND SCREEN
ABO/RH(D): O POS
Antibody Screen: NEGATIVE

## 2019-09-20 SURGERY — COLECTOMY, PARTIAL, ROBOT-ASSISTED, LAPAROSCOPIC
Anesthesia: General

## 2019-09-20 MED ORDER — ONDANSETRON HCL 4 MG/2ML IJ SOLN
INTRAMUSCULAR | Status: DC | PRN
  Administered 2019-09-20 (×2): 4 mg via INTRAVENOUS

## 2019-09-20 MED ORDER — SUGAMMADEX SODIUM 200 MG/2ML IV SOLN
INTRAVENOUS | Status: DC | PRN
  Administered 2019-09-20: 210 mg via INTRAVENOUS

## 2019-09-20 MED ORDER — PROPOFOL 10 MG/ML IV BOLUS
INTRAVENOUS | Status: DC | PRN
  Administered 2019-09-20: 150 mg via INTRAVENOUS

## 2019-09-20 MED ORDER — ALBUMIN HUMAN 5 % IV SOLN
INTRAVENOUS | Status: AC
  Filled 2019-09-20: qty 500

## 2019-09-20 MED ORDER — ROCURONIUM BROMIDE 10 MG/ML (PF) SYRINGE
PREFILLED_SYRINGE | INTRAVENOUS | Status: AC
  Filled 2019-09-20: qty 10

## 2019-09-20 MED ORDER — EPHEDRINE SULFATE 50 MG/ML IJ SOLN
INTRAMUSCULAR | Status: DC | PRN
  Administered 2019-09-20 (×8): 10 mg via INTRAVENOUS

## 2019-09-20 MED ORDER — INDOCYANINE GREEN 25 MG IV SOLR
INTRAVENOUS | Status: DC | PRN
  Administered 2019-09-20: 2.5 mg via INTRAVENOUS

## 2019-09-20 MED ORDER — GABAPENTIN 300 MG PO CAPS: 300.0000 mg | ORAL_CAPSULE | ORAL | Status: AC

## 2019-09-20 MED ORDER — ONDANSETRON HCL 4 MG/2ML IJ SOLN
4.0000 mg | Freq: Four times a day (QID) | INTRAMUSCULAR | Status: DC | PRN
  Administered 2019-09-22 (×2): 4 mg via INTRAVENOUS
  Filled 2019-09-20 (×2): qty 2

## 2019-09-20 MED ORDER — SEVOFLURANE IN SOLN
RESPIRATORY_TRACT | Status: AC
  Filled 2019-09-20: qty 250

## 2019-09-20 MED ORDER — DEXAMETHASONE SODIUM PHOSPHATE 10 MG/ML IJ SOLN
INTRAMUSCULAR | Status: AC
  Filled 2019-09-20: qty 1

## 2019-09-20 MED ORDER — FAMOTIDINE 20 MG PO TABS
ORAL_TABLET | ORAL | Status: AC
  Administered 2019-09-20: 20 mg via ORAL
  Filled 2019-09-20: qty 1

## 2019-09-20 MED ORDER — ALVIMOPAN 12 MG PO CAPS
ORAL_CAPSULE | ORAL | Status: AC
  Administered 2019-09-20: 12 mg via ORAL
  Filled 2019-09-20: qty 1

## 2019-09-20 MED ORDER — EPHEDRINE 5 MG/ML INJ
INTRAVENOUS | Status: AC
  Filled 2019-09-20: qty 10

## 2019-09-20 MED ORDER — SODIUM CHLORIDE (PF) 0.9 % IJ SOLN
INTRAMUSCULAR | Status: AC
  Filled 2019-09-20: qty 50

## 2019-09-20 MED ORDER — BUPIVACAINE HCL (PF) 0.5 % IJ SOLN
INTRAMUSCULAR | Status: AC
  Filled 2019-09-20: qty 30

## 2019-09-20 MED ORDER — PANTOPRAZOLE SODIUM 40 MG IV SOLR
40.0000 mg | Freq: Every day | INTRAVENOUS | Status: DC
  Administered 2019-09-20 – 2019-09-23 (×4): 40 mg via INTRAVENOUS
  Filled 2019-09-20 (×4): qty 40

## 2019-09-20 MED ORDER — SODIUM CHLORIDE 0.9 % IV SOLN
INTRAVENOUS | Status: AC
  Filled 2019-09-20: qty 2

## 2019-09-20 MED ORDER — BUPIVACAINE LIPOSOME 1.3 % IJ SUSP: INTRAMUSCULAR | Status: DC | PRN

## 2019-09-20 MED ORDER — FENTANYL CITRATE (PF) 100 MCG/2ML IJ SOLN
INTRAMUSCULAR | Status: AC
  Filled 2019-09-20: qty 2

## 2019-09-20 MED ORDER — ROCURONIUM BROMIDE 100 MG/10ML IV SOLN
INTRAVENOUS | Status: DC | PRN
  Administered 2019-09-20 (×4): 10 mg via INTRAVENOUS
  Administered 2019-09-20: 20 mg via INTRAVENOUS
  Administered 2019-09-20: 10 mg via INTRAVENOUS
  Administered 2019-09-20: 20 mg via INTRAVENOUS
  Administered 2019-09-20 (×2): 10 mg via INTRAVENOUS
  Administered 2019-09-20: 60 mg via INTRAVENOUS

## 2019-09-20 MED ORDER — PROPOFOL 10 MG/ML IV BOLUS
INTRAVENOUS | Status: AC
  Filled 2019-09-20: qty 20

## 2019-09-20 MED ORDER — ONDANSETRON HCL 4 MG/2ML IJ SOLN: 4.0000 mg | Freq: Once | INTRAMUSCULAR | Status: DC | PRN

## 2019-09-20 MED ORDER — DEXAMETHASONE SODIUM PHOSPHATE 10 MG/ML IJ SOLN
INTRAMUSCULAR | Status: DC | PRN
  Administered 2019-09-20: 4 mg via INTRAVENOUS

## 2019-09-20 MED ORDER — CELECOXIB 200 MG PO CAPS
ORAL_CAPSULE | ORAL | Status: AC
  Administered 2019-09-20: 200 mg via ORAL
  Filled 2019-09-20: qty 1

## 2019-09-20 MED ORDER — SODIUM CHLORIDE 0.9 % IV SOLN: INTRAVENOUS | Status: DC

## 2019-09-20 MED ORDER — BUPIVACAINE LIPOSOME 1.3 % IJ SUSP: 20.0000 mL | Freq: Once | INTRAMUSCULAR | Status: DC

## 2019-09-20 MED ORDER — PHENYLEPHRINE HCL (PRESSORS) 10 MG/ML IV SOLN
INTRAVENOUS | Status: DC | PRN
  Administered 2019-09-20 (×2): 100 ug via INTRAVENOUS
  Administered 2019-09-20: 50 ug via INTRAVENOUS
  Administered 2019-09-20: 100 ug via INTRAVENOUS

## 2019-09-20 MED ORDER — GLYCOPYRROLATE 0.2 MG/ML IJ SOLN
INTRAMUSCULAR | Status: DC | PRN
  Administered 2019-09-20: .2 mg via INTRAVENOUS

## 2019-09-20 MED ORDER — HYDROCODONE-ACETAMINOPHEN 5-325 MG PO TABS
1.0000 | ORAL_TABLET | ORAL | Status: DC | PRN
  Administered 2019-09-21: 1 via ORAL
  Administered 2019-09-22 – 2019-09-23 (×4): 2 via ORAL
  Filled 2019-09-20: qty 2
  Filled 2019-09-20: qty 1
  Filled 2019-09-20 (×3): qty 2

## 2019-09-20 MED ORDER — BUPIVACAINE HCL (PF) 0.5 % IJ SOLN
INTRAMUSCULAR | Status: DC | PRN
  Administered 2019-09-20: 10 mL

## 2019-09-20 MED ORDER — CELECOXIB 200 MG PO CAPS
200.0000 mg | ORAL_CAPSULE | Freq: Two times a day (BID) | ORAL | Status: DC
  Administered 2019-09-20 – 2019-09-24 (×8): 200 mg via ORAL
  Filled 2019-09-20 (×9): qty 1

## 2019-09-20 MED ORDER — ACETAMINOPHEN 500 MG PO TABS: 1000.0000 mg | ORAL_TABLET | ORAL | Status: AC

## 2019-09-20 MED ORDER — VISTASEAL 10 ML SINGLE DOSE KIT
PACK | CUTANEOUS | Status: DC | PRN
  Administered 2019-09-20: 10 mL via TOPICAL

## 2019-09-20 MED ORDER — MORPHINE SULFATE (PF) 4 MG/ML IV SOLN: 4.0000 mg | INTRAVENOUS | Status: DC | PRN

## 2019-09-20 MED ORDER — FENTANYL CITRATE (PF) 100 MCG/2ML IJ SOLN: 25.0000 ug | INTRAMUSCULAR | Status: DC | PRN

## 2019-09-20 MED ORDER — ALVIMOPAN 12 MG PO CAPS: 12.0000 mg | ORAL_CAPSULE | ORAL | Status: AC

## 2019-09-20 MED ORDER — LIDOCAINE HCL (PF) 2 % IJ SOLN
INTRAMUSCULAR | Status: AC
  Filled 2019-09-20: qty 5

## 2019-09-20 MED ORDER — ACETAMINOPHEN 500 MG PO TABS
ORAL_TABLET | ORAL | Status: AC
  Administered 2019-09-20: 1000 mg via ORAL
  Filled 2019-09-20: qty 2

## 2019-09-20 MED ORDER — CHLORHEXIDINE GLUCONATE CLOTH 2 % EX PADS
6.0000 | MEDICATED_PAD | Freq: Every day | CUTANEOUS | Status: DC
  Administered 2019-09-20: 6 via TOPICAL

## 2019-09-20 MED ORDER — FAMOTIDINE 20 MG PO TABS: 20.0000 mg | ORAL_TABLET | Freq: Once | ORAL | Status: AC

## 2019-09-20 MED ORDER — HEPARIN SODIUM (PORCINE) 5000 UNIT/ML IJ SOLN
INTRAMUSCULAR | Status: AC
  Administered 2019-09-20: 5000 [IU] via SUBCUTANEOUS
  Filled 2019-09-20: qty 1

## 2019-09-20 MED ORDER — ONDANSETRON HCL 4 MG/2ML IJ SOLN
INTRAMUSCULAR | Status: AC
  Filled 2019-09-20: qty 2

## 2019-09-20 MED ORDER — ONDANSETRON 4 MG PO TBDP: 4.0000 mg | ORAL_TABLET | Freq: Four times a day (QID) | ORAL | Status: DC | PRN

## 2019-09-20 MED ORDER — GABAPENTIN 300 MG PO CAPS
ORAL_CAPSULE | ORAL | Status: AC
  Administered 2019-09-20: 300 mg via ORAL
  Filled 2019-09-20: qty 1

## 2019-09-20 MED ORDER — SODIUM CHLORIDE 0.9 % IV SOLN
INTRAVENOUS | Status: DC | PRN
  Administered 2019-09-20: 10 mL

## 2019-09-20 MED ORDER — GLYCOPYRROLATE 0.2 MG/ML IJ SOLN
INTRAMUSCULAR | Status: AC
  Filled 2019-09-20: qty 1

## 2019-09-20 MED ORDER — LACTATED RINGERS IV SOLN: INTRAVENOUS | Status: DC

## 2019-09-20 MED ORDER — TEMAZEPAM 30 MG PO CAPS
30.0000 mg | ORAL_CAPSULE | Freq: Every day | ORAL | Status: DC
  Administered 2019-09-20: 30 mg via ORAL
  Filled 2019-09-20: qty 1

## 2019-09-20 MED ORDER — CELECOXIB 200 MG PO CAPS: 200.0000 mg | ORAL_CAPSULE | ORAL | Status: AC

## 2019-09-20 MED ORDER — LIDOCAINE HCL (CARDIAC) PF 100 MG/5ML IV SOSY
PREFILLED_SYRINGE | INTRAVENOUS | Status: DC | PRN
  Administered 2019-09-20: 80 mg via INTRAVENOUS

## 2019-09-20 MED ORDER — GABAPENTIN 300 MG PO CAPS
300.0000 mg | ORAL_CAPSULE | Freq: Two times a day (BID) | ORAL | Status: DC
  Administered 2019-09-20 – 2019-09-24 (×8): 300 mg via ORAL
  Filled 2019-09-20 (×8): qty 1

## 2019-09-20 MED ORDER — ALVIMOPAN 12 MG PO CAPS
12.0000 mg | ORAL_CAPSULE | Freq: Two times a day (BID) | ORAL | Status: DC
  Administered 2019-09-21 – 2019-09-22 (×3): 12 mg via ORAL
  Filled 2019-09-20 (×4): qty 1

## 2019-09-20 MED ORDER — BUPIVACAINE LIPOSOME 1.3 % IJ SUSP
INTRAMUSCULAR | Status: AC
  Filled 2019-09-20: qty 20

## 2019-09-20 MED ORDER — ALBUMIN HUMAN 5 % IV SOLN: INTRAVENOUS | Status: DC | PRN

## 2019-09-20 MED ORDER — HEPARIN SODIUM (PORCINE) 5000 UNIT/ML IJ SOLN: 5000.0000 [IU] | Freq: Once | INTRAMUSCULAR | Status: AC

## 2019-09-20 MED ORDER — SODIUM CHLORIDE 0.9 % IV SOLN
2.0000 g | Freq: Two times a day (BID) | INTRAVENOUS | Status: AC
  Administered 2019-09-20 – 2019-09-21 (×2): 2 g via INTRAVENOUS
  Filled 2019-09-20 (×2): qty 2

## 2019-09-20 MED ORDER — ENOXAPARIN SODIUM 40 MG/0.4ML ~~LOC~~ SOLN
40.0000 mg | SUBCUTANEOUS | Status: DC
  Administered 2019-09-22 – 2019-09-24 (×3): 40 mg via SUBCUTANEOUS
  Filled 2019-09-20 (×4): qty 0.4

## 2019-09-20 MED ORDER — FENTANYL CITRATE (PF) 100 MCG/2ML IJ SOLN
INTRAMUSCULAR | Status: DC | PRN
  Administered 2019-09-20: 25 ug via INTRAVENOUS
  Administered 2019-09-20: 50 ug via INTRAVENOUS
  Administered 2019-09-20: 25 ug via INTRAVENOUS
  Administered 2019-09-20 (×2): 50 ug via INTRAVENOUS

## 2019-09-20 MED ORDER — SODIUM CHLORIDE 0.9 % IV SOLN
INTRAVENOUS | Status: DC | PRN
  Administered 2019-09-20: 250 mL via INTRAVENOUS

## 2019-09-20 SURGICAL SUPPLY — 98 items
APPLICATOR VISTASEAL 35 (MISCELLANEOUS) ×1 IMPLANT
BLADE CLIPPER SURG (BLADE) ×3 IMPLANT
BLADE SURG SZ10 CARB STEEL (BLADE) ×3 IMPLANT
BLADE SURG SZ11 CARB STEEL (BLADE) ×3 IMPLANT
CANISTER SUCT 1200ML W/VALVE (MISCELLANEOUS) ×3 IMPLANT
CANNULA REDUC XI 12-8 STAPL (CANNULA) ×3
CANNULA REDUCER 12-8 DVNC XI (CANNULA) ×2 IMPLANT
CHLORAPREP W/TINT 26 (MISCELLANEOUS) ×3 IMPLANT
CLIP VESOLOCK MED LG 6/CT (CLIP) IMPLANT
COVER TIP SHEARS 8 DVNC (MISCELLANEOUS) ×2 IMPLANT
COVER TIP SHEARS 8MM DA VINCI (MISCELLANEOUS) ×3
COVER WAND RF STERILE (DRAPES) IMPLANT
DEFOGGER SCOPE WARMER CLEARIFY (MISCELLANEOUS) ×3 IMPLANT
DERMABOND ADVANCED (GAUZE/BANDAGES/DRESSINGS)
DERMABOND ADVANCED .7 DNX12 (GAUZE/BANDAGES/DRESSINGS) IMPLANT
DRAPE 3/4 80X56 (DRAPES) ×2 IMPLANT
DRAPE ARM DVNC X/XI (DISPOSABLE) ×8 IMPLANT
DRAPE COLUMN DVNC XI (DISPOSABLE) ×2 IMPLANT
DRAPE DA VINCI XI ARM (DISPOSABLE) ×12
DRAPE DA VINCI XI COLUMN (DISPOSABLE) ×3
DRAPE UNDER BUTTOCK W/FLU (DRAPES) ×3 IMPLANT
DRSG OPSITE POSTOP 4X10 (GAUZE/BANDAGES/DRESSINGS) IMPLANT
DRSG OPSITE POSTOP 4X8 (GAUZE/BANDAGES/DRESSINGS) IMPLANT
ELECT BLADE 6.5 EXT (BLADE) IMPLANT
ELECT CAUTERY BLADE 6.4 (BLADE) IMPLANT
ELECT REM PT RETURN 9FT ADLT (ELECTROSURGICAL) ×3
ELECTRODE REM PT RTRN 9FT ADLT (ELECTROSURGICAL) ×2 IMPLANT
GLOVE BIO SURGEON STRL SZ 6.5 (GLOVE) ×9 IMPLANT
GLOVE BIOGEL PI IND STRL 6.5 (GLOVE) ×6 IMPLANT
GLOVE BIOGEL PI INDICATOR 6.5 (GLOVE) ×3
GOWN STRL REUS W/ TWL LRG LVL3 (GOWN DISPOSABLE) ×12 IMPLANT
GOWN STRL REUS W/TWL LRG LVL3 (GOWN DISPOSABLE) ×18
GRASPER SUT TROCAR 14GX15 (MISCELLANEOUS) IMPLANT
HANDLE YANKAUER SUCT BULB TIP (MISCELLANEOUS) ×3 IMPLANT
IRRIGATION STRYKERFLOW (MISCELLANEOUS) IMPLANT
IRRIGATOR STRYKERFLOW (MISCELLANEOUS) ×3
IRRIGATOR SUCT 8 DISP DVNC XI (IRRIGATION / IRRIGATOR) IMPLANT
IRRIGATOR SUCTION 8MM XI DISP (IRRIGATION / IRRIGATOR)
IV NS 1000ML (IV SOLUTION) ×3
IV NS 1000ML BAXH (IV SOLUTION) IMPLANT
KIT IMAGING PINPOINTPAQ (MISCELLANEOUS) ×1 IMPLANT
KIT PINK PAD W/HEAD ARE REST (MISCELLANEOUS) ×3
KIT PINK PAD W/HEAD ARM REST (MISCELLANEOUS) ×2 IMPLANT
LABEL OR SOLS (LABEL) IMPLANT
LEGGING LITHOTOMY PAIR STRL (DRAPES) ×3 IMPLANT
NEEDLE HYPO 22GX1.5 SAFETY (NEEDLE) ×3 IMPLANT
OBTURATOR OPTICAL STANDARD 8MM (TROCAR) ×3
OBTURATOR OPTICAL STND 8 DVNC (TROCAR) ×2
OBTURATOR OPTICALSTD 8 DVNC (TROCAR) ×2 IMPLANT
PACK COLON CLEAN CLOSURE (MISCELLANEOUS) ×2 IMPLANT
PACK LAP CHOLECYSTECTOMY (MISCELLANEOUS) ×3 IMPLANT
PENCIL ELECTRO HAND CTR (MISCELLANEOUS) ×3 IMPLANT
PORT ACCESS TROCAR AIRSEAL 5 (TROCAR) ×3 IMPLANT
RELOAD STAPLE 45 3.5 BLU DVNC (STAPLE) IMPLANT
RELOAD STAPLE 60 3.5 BLU DVNC (STAPLE) IMPLANT
RELOAD STAPLER 3.5X45 BLU DVNC (STAPLE) ×16 IMPLANT
RELOAD STAPLER 3.5X60 BLU DVNC (STAPLE) IMPLANT
RETRACTOR RING XSMALL (MISCELLANEOUS) IMPLANT
RTRCTR WOUND ALEXIS 13CM XS SH (MISCELLANEOUS)
SCALPEL PROTECTED #10 DISP (BLADE) ×1 IMPLANT
SEAL CANN UNIV 5-8 DVNC XI (MISCELLANEOUS) ×6 IMPLANT
SEAL XI 5MM-8MM UNIVERSAL (MISCELLANEOUS) ×9
SEALER VESSEL DA VINCI XI (MISCELLANEOUS) ×3
SEALER VESSEL EXT DVNC XI (MISCELLANEOUS) IMPLANT
SET TRI-LUMEN FLTR TB AIRSEAL (TUBING) ×3 IMPLANT
SOL PREP PVP 2OZ (MISCELLANEOUS) ×3
SOLUTION ELECTROLUBE (MISCELLANEOUS) ×3 IMPLANT
SOLUTION PREP PVP 2OZ (MISCELLANEOUS) ×2 IMPLANT
SPONGE LAP 4X18 RFD (DISPOSABLE) ×3 IMPLANT
STAPLER 45 DA VINCI SURE FORM (STAPLE) ×3
STAPLER 45 SUREFORM DVNC (STAPLE) IMPLANT
STAPLER 60 DA VINCI SURE FORM (STAPLE)
STAPLER 60 SUREFORM DVNC (STAPLE) IMPLANT
STAPLER CANNULA SEAL DVNC XI (STAPLE) ×2 IMPLANT
STAPLER CANNULA SEAL XI (STAPLE) ×3
STAPLER CIRCULAR MANUAL XL 29 (STAPLE) ×1 IMPLANT
STAPLER RELOAD 3.5X45 BLU DVNC (STAPLE) ×16
STAPLER RELOAD 3.5X45 BLUE (STAPLE) ×24
STAPLER RELOAD 3.5X60 BLU DVNC (STAPLE)
STAPLER RELOAD 3.5X60 BLUE (STAPLE)
SURGILUBE 2OZ TUBE FLIPTOP (MISCELLANEOUS) ×3 IMPLANT
SUT MNCRL 4-0 (SUTURE) ×6
SUT MNCRL 4-0 27XMFL (SUTURE) ×4
SUT MNCRL AB 4-0 PS2 18 (SUTURE) ×3 IMPLANT
SUT PDS PLUS 0 (SUTURE) ×6
SUT PDS PLUS AB 0 CT-2 (SUTURE) ×4 IMPLANT
SUT SILK 0 SH 30 (SUTURE) ×6 IMPLANT
SUT SILK 2 0 SH (SUTURE) ×1 IMPLANT
SUT SILK 3-0 (SUTURE) IMPLANT
SUT VIC AB 3-0 SH 27 (SUTURE) ×3
SUT VIC AB 3-0 SH 27X BRD (SUTURE) ×8 IMPLANT
SUT VICRYL 0 AB UR-6 (SUTURE) ×4 IMPLANT
SUT VLOC 90 6 CV-15 VIOLET (SUTURE) ×2 IMPLANT
SUTURE MNCRL 4-0 27XMF (SUTURE) ×4 IMPLANT
SYR 30ML LL (SYRINGE) ×6 IMPLANT
SYS TROCAR 1.5-3 SLV ABD GEL (ENDOMECHANICALS) ×3
SYSTEM TROCR 1.5-3 SLV ABD GEL (ENDOMECHANICALS) ×2 IMPLANT
TRAY FOLEY MTR SLVR 16FR STAT (SET/KITS/TRAYS/PACK) ×3 IMPLANT

## 2019-09-20 NOTE — Transfer of Care (Signed)
Immediate Anesthesia Transfer of Care Note  Patient: Garrett Peterson  Procedure(s) Performed: XI ROBOT ASSISTED LAPAROSCOPIC SIGMOID COLECTOMY (N/A ) INDOCYANINE GREEN FLUORESCENCE IMAGING (ICG)  Patient Location: PACU  Anesthesia Type:General  Level of Consciousness: awake and patient cooperative  Airway & Oxygen Therapy: Patient Spontanous Breathing and Patient connected to face mask oxygen  Post-op Assessment: Report given to RN and Post -op Vital signs reviewed and stable  Post vital signs: Reviewed and stable  Last Vitals:  Vitals Value Taken Time  BP 167/69 09/20/19 1902  Temp    Pulse 87 09/20/19 1904  Resp 26 09/20/19 1904  SpO2 100 % 09/20/19 1904  Vitals shown include unvalidated device data.  Last Pain:  Vitals:   09/20/19 0818  PainSc: 0-No pain         Complications: No apparent anesthesia complications

## 2019-09-20 NOTE — Anesthesia Preprocedure Evaluation (Signed)
Anesthesia Evaluation  Patient identified by MRN, date of birth, ID band Patient awake    Reviewed: Allergy & Precautions, H&P , NPO status , Patient's Chart, lab work & pertinent test results, reviewed documented beta blocker date and time   Airway Mallampati: II  TM Distance: >3 FB Neck ROM: full    Dental  (+) Teeth Intact   Pulmonary neg pulmonary ROS,    Pulmonary exam normal        Cardiovascular Exercise Tolerance: Good + CAD  Normal cardiovascular exam Rhythm:regular Rate:Normal     Neuro/Psych negative neurological ROS  negative psych ROS   GI/Hepatic negative GI ROS, Neg liver ROS,   Endo/Other  negative endocrine ROS  Renal/GU negative Renal ROS  negative genitourinary   Musculoskeletal   Abdominal   Peds  Hematology negative hematology ROS (+)   Anesthesia Other Findings Past Medical History: No date: Arthritis No date: Basal cell carcinoma No date: CAD (coronary artery disease) No date: Hyperlipidemia No date: Pre-diabetes Past Surgical History: No date: APPENDECTOMY No date: CARDIAC CATHETERIZATION No date: COLONOSCOPY No date: CORONARY ARTERY BYPASS GRAFT No date: open heart surgery BMI    Body Mass Index: 34.06 kg/m     Reproductive/Obstetrics negative OB ROS                             Anesthesia Physical Anesthesia Plan  ASA: III  Anesthesia Plan: General ETT   Post-op Pain Management:    Induction:   PONV Risk Score and Plan: 3  Airway Management Planned:   Additional Equipment:   Intra-op Plan:   Post-operative Plan:   Informed Consent: I have reviewed the patients History and Physical, chart, labs and discussed the procedure including the risks, benefits and alternatives for the proposed anesthesia with the patient or authorized representative who has indicated his/her understanding and acceptance.     Dental Advisory Given  Plan  Discussed with: CRNA  Anesthesia Plan Comments:         Anesthesia Quick Evaluation

## 2019-09-20 NOTE — Op Note (Signed)
Preoperative diagnosis: Rectosimgoid Cancer  Postoperative diagnosis: Rectal Cancer  Procedure: Robotic assisted laparoscopic Low anterior resection.                      Robotic assisted laparoscopic splenic flexure mobilization                      Extensive lysis of adhesions.    Anesthesia: GETA   Surgeon: Herbert Pun, MD  Assistant: Dr. Lysle Pearl    Wound Classification: Clean contaminated   Specimen: Sigmoid colon                     Rectum   Complications: None   Estimated Blood Loss: 50 mL   Indications: Patient is a 74 y.o. male with rectal bleeding was found to have colonoscopy with mass at rectosigmoid junction. Elective resection was indicated.     FIndings: 1.  Distal sigmoid and rectum with tattoo 2.  Adequate hemostasis.  3.  No gross metastasis noted 4. Extensive adhesion in upper abdomen   Description of procedure: The patient was placed on the operating table in the lithotomy position, both arms tucked. General anesthesia was induced. A time-out was completed verifying correct patient, procedure, site, positioning, and implant(s) and/or special equipment prior to beginning this procedure. The abdomen was prepped and draped in the usual sterile fashion.    A small 3 cm incision was done in the right lower quadrant.  Dissection was carried down to anterior fascia.  Fascia was opened with Bovie catheter.  Posterior fascia was grabbed with hemostats.  Posterior fascia and peritoneum was opened with Metzenbaum scissors.  Mini GelPort was placed.  Abdominal cavity was insufflated to 15 mmHg. Patient tolerated insufflation well.  3 additional 8 mm ports were made 8 cm apart along the right side of the abdominal wall. 5 mm assistant port was then placed on the right subcostal area.  No injuries from trocar placements were noted. The table was placed in the Trendelenburg position.  Xi robotic platform was then brought to the operative field and docked at an angle from  the left lower quadrant.  Tip up grasper and fenestrated bipolar were placed in the left arm ports.  Scissors were placed in right arm port.   Examination of the abdominal cavity noted no signs of gross metastasis.  The sigmoid colon with inking of the rectal mass was noted.  This was a lower mass than expected from Colonoscopy. This made the surge more time consuming than expected.  Abundant amount of adhesions were identified in the upper abdomen.  Extensive time consuming Lysis of adhesion was done with scissors and cautery to be able to mobilize the splenic flexure.   After lysis of adhesions, the sigmoid and descending colon was mobilized from the lateral attachment following the [employer]. This was followed up to the splenic flexure. The splenic flexure was taken down and mobilized to let the distal end of the descending colon reach the rectum without tension. When mobilized, a small window on the mesentery of the distal descending colon where healthy colon was identified was done.  At this point the colon was divided with the linear stapler.  Then the mesentery was divided with Vessel Sealer device. The mesentery was divided down to the rectum where the distal tattoo was identified. At this point the rectum was divided with stapler device. 2.5 mg of ICG green was flushed intravenously and the site  of adequate vascularity was identified. The specimen was inspected on the back table.  The distance from the mass to the distal margin was 4 cm.  I once again did more dissection of the rectal stump to get another centimeter of distal margin.  The descending colon was easily exteriorized through the right lower quadrant.  An anvil was inserted through the colotomy and pursestring down to secure the anvil.  Once the descending colon was identified reaching the rectum without tension, the assistant surgeon introduced the 29 mm EEA rectally. It was guided under direct vision up to the level of the  distal staple line on the rectal stump. The spike of the EEA device was then deployed to pierce the rectal stump, the anvil was then attached to this spike, and the EEA device is closed and fired to perform a stapled end-to-end anastomosis. The doughnuts produced by the EEA stapler were checked to ensure that they were complete. Furthermore, an air leak test was carried out by insufflating air gently into the rectum, while the anastomosis was bathed underwater. A clamp was placed on the proximal colon to prevent its distention. Once the anastomosis was properly tested, the patient was repositioned back in the normal anatomical position.  A Evistaseal was applied around the anastomosis.  The trocars were removed under direct vision and the fascia of the right lower quadrant was closed with a #0 PDS suture.  The skin of the right lower quadrant incision was closed with staples.  The skin of the trocar incisions was closed with 4-0 Monocryl sutures in a running fashion and a dry sterile dressing is applied. The sponge and instrument count were correct, blood loss was minimal, and there were no complications.    The patient tolerated the procedure well, awakened from anesthesia and was taken to the postanesthesia care unit in satisfactory condition.  Foley still in place.  Sponge count and instrument count correct at the end of the procedure.

## 2019-09-20 NOTE — Anesthesia Postprocedure Evaluation (Signed)
Anesthesia Post Note  Patient: KADARRIUS YANKE  Procedure(s) Performed: XI ROBOT ASSISTED LAPAROSCOPIC SIGMOID COLECTOMY (N/A ) INDOCYANINE GREEN FLUORESCENCE IMAGING (ICG)  Patient location during evaluation: PACU Anesthesia Type: General Level of consciousness: awake and alert Pain management: pain level controlled Vital Signs Assessment: post-procedure vital signs reviewed and stable Respiratory status: spontaneous breathing, nonlabored ventilation, respiratory function stable and patient connected to nasal cannula oxygen Cardiovascular status: blood pressure returned to baseline and stable Postop Assessment: no apparent nausea or vomiting Anesthetic complications: no     Last Vitals:  Vitals:   09/20/19 2111 09/20/19 2226  BP: (!) 159/73 (!) 158/66  Pulse: 81 84  Resp: 15 16  Temp: 36.6 C 36.5 C  SpO2: 100% 99%    Last Pain:  Vitals:   09/20/19 2226  TempSrc: Oral  PainSc:                  Martha Clan

## 2019-09-20 NOTE — Interval H&P Note (Signed)
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  The various methods of treatment have been discussed with the patient and family. After consideration of risks, benefits and other options for treatment, the patient has consented to  Procedure(s): XI [place] (N/A) as a surgical intervention.  The patient's history has been reviewed, patient examined, no change in status, stable for surgery.  I have reviewed the patient's chart and labs.  Questions were answered to the patient's satisfaction.     Herbert Pun

## 2019-09-20 NOTE — Anesthesia Procedure Notes (Signed)
Procedure Name: Intubation Date/Time: 09/20/2019 9:47 AM Performed by: Lerry Liner, CRNA Pre-anesthesia Checklist: Patient identified, Emergency Drugs available, Suction available, Patient being monitored and Timeout performed Patient Re-evaluated:Patient Re-evaluated prior to induction Oxygen Delivery Method: Circle system utilized Preoxygenation: Pre-oxygenation with 100% oxygen Induction Type: IV induction Ventilation: Mask ventilation without difficulty Laryngoscope Size: McGraph and 4 Grade View: Grade II Tube type: Oral Tube size: 7.5 mm Number of attempts: 1 Airway Equipment and Method: Stylet Placement Confirmation: ETT inserted through vocal cords under direct vision,  positive ETCO2 and breath sounds checked- equal and bilateral Secured at: 2 cm Tube secured with: Tape Dental Injury: Teeth and Oropharynx as per pre-operative assessment

## 2019-09-21 LAB — CBC
HCT: 36 % — ABNORMAL LOW (ref 39.0–52.0)
Hemoglobin: 12.6 g/dL — ABNORMAL LOW (ref 13.0–17.0)
MCH: 33.9 pg (ref 26.0–34.0)
MCHC: 35 g/dL (ref 30.0–36.0)
MCV: 96.8 fL (ref 80.0–100.0)
Platelets: 183 10*3/uL (ref 150–400)
RBC: 3.72 MIL/uL — ABNORMAL LOW (ref 4.22–5.81)
RDW: 12.4 % (ref 11.5–15.5)
WBC: 12 10*3/uL — ABNORMAL HIGH (ref 4.0–10.5)
nRBC: 0 % (ref 0.0–0.2)

## 2019-09-21 LAB — BASIC METABOLIC PANEL
Anion gap: 7 (ref 5–15)
BUN: 12 mg/dL (ref 8–23)
CO2: 24 mmol/L (ref 22–32)
Calcium: 8.3 mg/dL — ABNORMAL LOW (ref 8.9–10.3)
Chloride: 108 mmol/L (ref 98–111)
Creatinine, Ser: 1.17 mg/dL (ref 0.61–1.24)
GFR calc Af Amer: >60 mL/min (ref >60)
GFR calc non Af Amer: >60 mL/min (ref >60)
Glucose, Bld: 116 mg/dL — ABNORMAL HIGH (ref 70–99)
Potassium: 3.9 mmol/L (ref 3.5–5.1)
Sodium: 139 mmol/L (ref 135–145)

## 2019-09-21 LAB — PHOSPHORUS: Phosphorus: 3.6 mg/dL (ref 2.5–4.6)

## 2019-09-21 LAB — MAGNESIUM: Magnesium: 1.9 mg/dL (ref 1.7–2.4)

## 2019-09-21 MED ORDER — ZOLPIDEM TARTRATE 5 MG PO TABS
5.0000 mg | ORAL_TABLET | Freq: Every evening | ORAL | Status: DC | PRN
  Administered 2019-09-21 – 2019-09-23 (×3): 5 mg via ORAL
  Filled 2019-09-21 (×3): qty 1

## 2019-09-21 NOTE — Progress Notes (Signed)
[facility] Hospital Day(s): 1.   Post op day(s): 1 Day Post-Op.   Interval History: Patient seen and examined, no acute events or new complaints overnight. Patient reports feeling well. Reports minimal pain. Denies nausea or vomiting.  Vital signs in last 24 hours: [min-max] current  Temp:  [96.8 F (36 C)-98.6 F (37 C)] 98 F (36.7 C) (05/11 1151) Pulse Rate:  [69-89] 69 (05/11 1151) Resp:  [14-26] 18 (05/11 0543) BP: (107-180)/(54-84) 131/66 (05/11 1151) SpO2:  [96 %-100 %] 98 % (05/11 1151)     Height: _0  (172.7 cm) Weight: 101.6 kg BMI (Calculated): 34.07   Physical Exam:  Constitutional: alert, cooperative and no distress  Respiratory: breathing non-labored at rest  Cardiovascular: regular rate and sinus rhythm  Gastrointestinal: soft, non-tender, and non-distended  Labs:  CBC Latest Ref Rng & Units 09/21/2019 09/06/2019 09/20/2011  WBC 4.0 - 10.5 K/uL 12.0(H) 8.5 5.1  Hemoglobin 13.0 - 17.0 g/dL 12.6(L) 14.9 14.4  Hematocrit 39.0 - 52.0 % 36.0(L) 42.4 41.1  Platelets 150 - 400 K/uL 183 194 135(L)   CMP Latest Ref Rng & Units 09/21/2019 09/06/2019 09/20/2011  Glucose 70 - 99 mg/dL 116(H) 121(H) 119(H)  BUN 8 - 23 mg/dL _1 Creatinine 0.61 - 1.24 mg/dL 1.17 1.10 0.99  Sodium 135 - 145 mmol/L 139 139 142  Potassium 3.5 - 5.1 mmol/L 3.9 4.1 3.8  Chloride 98 - 111 mmol/L 108 103 107  CO2 22 - 32 mmol/L _2 Calcium 8.9 - 10.3 mg/dL 8.3(L) 9.2 8.8  Total Protein 6.5 - 8.1 g/dL - 8.2(H) -  Total Bilirubin 0.3 - 1.2 mg/dL - 0.9 -  Alkaline Phos 38 - 126 U/L - 73 -  AST 15 - 41 U/L - 23 -  ALT 0 - 44 U/L - 26 -    Imaging studies: No new pertinent imaging studies   Assessment/Plan:  74 y.o. male with rectosigmoid cancer 1 Day Post-Op s/p robotic assisted lap lown anterior resection.  Patient recovering adequately. Pain controlled. Stable vital signs with no fever. Labs with adequate hemoglobin and no significant electrolyte disturbance.  Adequate urine output. Foley removed. Tolerating clear liquid diet. Still not passing gas. Will continue clear liquid and Entereg. Will continue pain management and DVT prophylaxis. Encourage to ambulate.    Arnold Long, MD

## 2019-09-22 MED ORDER — POLYETHYLENE GLYCOL 3350 17 G PO PACK
17.0000 g | PACK | Freq: Every day | ORAL | Status: DC
  Administered 2019-09-22 – 2019-09-24 (×3): 17 g via ORAL
  Filled 2019-09-22 (×3): qty 1

## 2019-09-22 MED ORDER — ALVIMOPAN 12 MG PO CAPS
12.0000 mg | ORAL_CAPSULE | Freq: Two times a day (BID) | ORAL | Status: DC
  Administered 2019-09-22: 12 mg via ORAL
  Filled 2019-09-22 (×2): qty 1

## 2019-09-22 NOTE — Progress Notes (Signed)
[facility] Hospital Day(s): 2.   Post op day(s): 2 Days Post-Op.   Interval History: Patient seen and examined, no acute events or new complaints overnight. Patient reports feeling well from surgery standpoint but unable to sleep well because the IV machine was beeping all night. Still not passing gas. No nausea or vomiting.   Vital signs in last 24 hours: [min-max] current  Temp:  [98 F (36.7 C)-98.2 F (36.8 C)] 98.2 F (36.8 C) (05/12 0429) Pulse Rate:  [64-69] 65 (05/12 0429) Resp:  [16-20] 20 (05/12 0429) BP: (127-131)/(61-66) 128/64 (05/12 0429) SpO2:  [78 %-98 %] 96 % (05/12 0429)     Height: _0  (172.7 cm) Weight: 101.6 kg BMI (Calculated): 34.07   Physical Exam:  Constitutional: alert, cooperative and no distress  Respiratory: breathing non-labored at rest  Cardiovascular: regular rate and sinus rhythm  Gastrointestinal: soft, non-tender, and non-distended. Wounds are dry and clean.   Labs:  CBC Latest Ref Rng & Units 09/21/2019 09/06/2019 09/20/2011  WBC 4.0 - 10.5 K/uL 12.0(H) 8.5 5.1  Hemoglobin 13.0 - 17.0 g/dL 12.6(L) 14.9 14.4  Hematocrit 39.0 - 52.0 % 36.0(L) 42.4 41.1  Platelets 150 - 400 K/uL 183 194 135(L)   CMP Latest Ref Rng & Units 09/21/2019 09/06/2019 09/20/2011  Glucose 70 - 99 mg/dL 116(H) 121(H) 119(H)  BUN 8 - 23 mg/dL _1 Creatinine 0.61 - 1.24 mg/dL 1.17 1.10 0.99  Sodium 135 - 145 mmol/L 139 139 142  Potassium 3.5 - 5.1 mmol/L 3.9 4.1 3.8  Chloride 98 - 111 mmol/L 108 103 107  CO2 22 - 32 mmol/L _2 Calcium 8.9 - 10.3 mg/dL 8.3(L) 9.2 8.8  Total Protein 6.5 - 8.1 g/dL - 8.2(H) -  Total Bilirubin 0.3 - 1.2 mg/dL - 0.9 -  Alkaline Phos 38 - 126 U/L - 73 -  AST 15 - 41 U/L - 23 -  ALT 0 - 44 U/L - 26 -    Imaging studies: No new pertinent imaging studies   Assessment/Plan:  74 y.o. male with rectosigmoid cancer 2 Day Post-Op s/p robotic assisted lap low anterior resection.  No clinical deterioration. Continue  with no tachycardia, no fever. Adequate blood pressure. Patient tolerating clear liquid without nausea or vomiting. No abdominal distention. Will add Miralax and advance diet to full liquid. Patient drinking plenty of water so will hold IV fluids and keep with hep lock. Will continue with DVT prophylaxis. Encourage patient to ambulate.   Arnold Long, MD

## 2019-09-23 NOTE — Care Management Important Message (Signed)
Important Message  Patient Details  Name: Garrett Peterson MRN: [id number] Date of Birth: [birth year]   Medicare Important Message Given:  Yes  Initial Medicare IM given by Patient Access Associate on 09/22/2019 at 9:04am.   Dannette Barbara 09/23/2019, 11:55 AM

## 2019-09-23 NOTE — Progress Notes (Signed)
[facility] Hospital Day(s): 3.   Post op day(s): 3 Days Post-Op.   Interval History: Patient seen and examined, no acute events or new complaints overnight. Patient reports he had 2 bowel movements last night.  The second 1 came with significant amount of flatus.  Patient denies any nausea or vomiting.  The patient reported the pain continues to be controlled.  He has been ambulating adequately.  Vital signs in last 24 hours: [min-max] current  Temp:  [97.9 F (36.6 C)-99.3 F (37.4 C)] 97.9 F (36.6 C) (05/13 0456) Pulse Rate:  [72-87] 72 (05/13 0456) Resp:  [16-18] 18 (05/13 0456) BP: (106-134)/(56-72) 127/65 (05/13 0456) SpO2:  [95 %-97 %] 97 % (05/13 0456)     Height: _0  (172.7 cm) Weight: 101.6 kg BMI (Calculated): 34.07   Physical Exam:  Constitutional: alert, cooperative and no distress  Respiratory: breathing non-labored at rest  Cardiovascular: regular rate and sinus rhythm  Gastrointestinal: soft, non-tender, and non-distended  Labs:  CBC Latest Ref Rng & Units 09/21/2019 09/06/2019 09/20/2011  WBC 4.0 - 10.5 K/uL 12.0(H) 8.5 5.1  Hemoglobin 13.0 - 17.0 g/dL 12.6(L) 14.9 14.4  Hematocrit 39.0 - 52.0 % 36.0(L) 42.4 41.1  Platelets 150 - 400 K/uL 183 194 135(L)   CMP Latest Ref Rng & Units 09/21/2019 09/06/2019 09/20/2011  Glucose 70 - 99 mg/dL 116(H) 121(H) 119(H)  BUN 8 - 23 mg/dL _1 Creatinine 0.61 - 1.24 mg/dL 1.17 1.10 0.99  Sodium 135 - 145 mmol/L 139 139 142  Potassium 3.5 - 5.1 mmol/L 3.9 4.1 3.8  Chloride 98 - 111 mmol/L 108 103 107  CO2 22 - 32 mmol/L _2 Calcium 8.9 - 10.3 mg/dL 8.3(L) 9.2 8.8  Total Protein 6.5 - 8.1 g/dL - 8.2(H) -  Total Bilirubin 0.3 - 1.2 mg/dL - 0.9 -  Alkaline Phos 38 - 126 U/L - 73 -  AST 15 - 41 U/L - 23 -  ALT 0 - 44 U/L - 26 -    Imaging studies: No new pertinent imaging studies   Assessment/Plan:  74 y.o.malewith rectosigmoid cancer3 Day Post-Ops/p robotic assisted lap low anterior  resection.  Patient recovering adequately.  No fever or tachycardia.  He passed stool and gas last night.  Will advance diet to soft diet.  Will assess diet toleration.  Will discontinue Entereg.  Encourage the patient to ambulate.  We will continue with DVT prophylaxis.  We will continue with pain management.  Arnold Long, MD

## 2019-09-24 MED ORDER — HYDROCODONE-ACETAMINOPHEN 5-325 MG PO TABS: 1.0000 | ORAL_TABLET | ORAL | 0 refills | Status: AC | PRN

## 2019-09-24 NOTE — Discharge Instructions (Signed)
Diet: Resume home heart healthy regular diet.   Activity: No heavy lifting >20 pounds (children, pets, laundry, garbage) or strenuous activity until follow-up, but light activity and walking are encouraged. Do not drive or drink alcohol if taking narcotic pain medications.  Wound care: May shower with soapy water and pat dry (do not rub incisions), but no baths or submerging incision underwater until follow-up. (no swimming)   Medications: Resume all home medications. For mild to moderate pain: acetaminophen (Tylenol) or ibuprofen (if no kidney disease). Combining Tylenol with alcohol can substantially increase your risk of causing liver disease. Narcotic pain medications, if prescribed, can be used for severe pain, though may cause nausea, constipation, and drowsiness. Do not combine Tylenol and Norco within a 6 hour period as Norco contains Tylenol. If you do not need the narcotic pain medication, you do not need to fill the prescription.  Call office [phone]) at any time if any questions, worsening pain, fevers/chills, bleeding, drainage from incision site, or other concerns.

## 2019-09-24 NOTE — Care Management Important Message (Signed)
Important Message  Patient Details  Name: Garrett Peterson MRN: [id number] Date of Birth: 28-May-1945   Medicare Important Message Given:  Yes     Dannette Barbara 09/24/2019, 1:54 PM

## 2019-09-24 NOTE — Discharge Summary (Signed)
  Patient ID: Garrett Peterson MRN: [id number] DOB/AGE: Apr 28, 1946 74 y.o.  Admit date: 09/20/2019 Discharge date: 09/24/2019   Discharge Diagnoses:  Active Problems:   Colon cancer [facility])   Procedures: Robotic assisted laparoscopic low anterior resection  Hospital Course: Patient had low anterior resection due to rectal cancer. Tolerated the procedure well. Recovering adequately. Ambulating, voiding spontaneously, passing gas, tolerating soft diet and pain controlled.   Physical Exam  Constitutional: He is oriented to person, place, and time and well-developed, well-nourished, and in no distress.  Cardiovascular: Normal rate.  Pulmonary/Chest: Effort normal.  Abdominal: Soft. He exhibits no distension. There is no abdominal tenderness. There is no rebound.  Neurological: He is alert and oriented to person, place, and time.  Wounds are dry and clean.    Consults: None  Disposition: Discharge disposition: 01-Home or Self Care       Discharge Instructions    Diet - low sodium heart healthy   Complete by: As directed    Increase activity slowly   Complete by: As directed      Allergies as of 09/24/2019   No Known Allergies     Medication List    TAKE these medications   atorvastatin 20 MG tablet Commonly known as: LIPITOR Take 20 mg by mouth every evening.   HYDROcodone-acetaminophen 5-325 MG tablet Commonly known as: Norco Take 1 tablet by mouth every 4 (four) hours as needed for up to 3 days for moderate pain.   naproxen sodium 220 MG tablet Commonly known as: ALEVE Take 220 mg by mouth daily as needed.   temazepam 30 MG capsule Commonly known as: RESTORIL Take 30 mg by mouth at bedtime.      Follow-up Information    Herbert Pun, MD Follow up in 2 week(s).   Specialty: General Surgery Contact information: [address] [place] [address] [phone]

## 2019-09-28 ENCOUNTER — Inpatient Hospital Stay (HOSPITAL_BASED_OUTPATIENT_CLINIC_OR_DEPARTMENT_OTHER): Payer: Medicare Other | Admitting: Oncology

## 2019-09-28 ENCOUNTER — Other Ambulatory Visit: Payer: Self-pay

## 2019-09-28 ENCOUNTER — Encounter: Payer: Self-pay | Admitting: Oncology

## 2019-09-28 VITALS — BP 150/73 | HR 67 | Temp 97.3°F | Resp 18 | Wt 228.7 lb

## 2019-09-28 DIAGNOSIS — J984 Other disorders of lung: Secondary | ICD-10-CM | POA: Diagnosis not present

## 2019-09-28 DIAGNOSIS — Z7189 Other specified counseling: Secondary | ICD-10-CM

## 2019-09-28 DIAGNOSIS — C20 Malignant neoplasm of rectum: Secondary | ICD-10-CM

## 2019-09-28 DIAGNOSIS — N281 Cyst of kidney, acquired: Secondary | ICD-10-CM | POA: Diagnosis not present

## 2019-09-28 DIAGNOSIS — Z85828 Personal history of other malignant neoplasm of skin: Secondary | ICD-10-CM | POA: Diagnosis not present

## 2019-09-28 NOTE — Progress Notes (Addendum)
.  Hematology/Oncology follow up  note [facility] [phone] [phone]   Patient Care Team: Albina Billet, MD as PCP - General (Internal Medicine) Clent Jacks, RN as Oncology Nurse Navigator Earlie Server, MD as Consulting Physician (Oncology)  REFERRING PROVIDER: Albina Billet, MD  CHIEF COMPLAINTS/REASON FOR VISIT:  Follow-up for colorectal cancer  HISTORY OF PRESENTING ILLNESS:   Garrett Peterson is a  74 y.o.  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 Alice Reichert for follow-up on history of colon polyps.  Patient also has reported recent history of change of bowel habits.  He has felt more constipated. No blood in the stool, abdominal pain, unintentional weight loss or fever or chills. Colonoscopy records is at [facility] clinic and is not available to me. 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  MMR is deferred to resection specimen.  Patient has already had a staging CT scan scheduled this afternoon.  INTERVAL HISTORY Garrett Peterson is a 74 y.o. male who has above history reviewed by me today presents for follow up visit for management of colorectal cancer. Problems and complaints are listed below: 09/20/2019, patient underwent robotic assisted laparoscopic low anterior resection. Patient is accompanied by wife today to discuss surgical pathology and adjuvant treatments. Patient reports to have some soreness and tenderness lower abdomen around procedure sites. No fever, chills, nausea, vomiting Review of Systems  Constitutional: Negative for appetite change, chills, fatigue, fever and unexpected weight change.  HENT:   Negative for hearing loss and voice change.   Eyes: Negative for eye problems and icterus.  Respiratory:  Negative for chest tightness, cough and shortness of breath.   Cardiovascular: Negative for chest pain and leg swelling.  Gastrointestinal: Negative for abdominal distention, abdominal pain and blood in stool.  Endocrine: Negative for hot flashes.  Genitourinary: Negative for difficulty urinating, dysuria and frequency.   Musculoskeletal: Negative for arthralgias.  Skin: Negative for itching and rash.  Neurological: Negative for extremity weakness, light-headedness and numbness.  Hematological: Negative for adenopathy. Does not bruise/bleed easily.  Psychiatric/Behavioral: Negative for confusion.    MEDICAL HISTORY:  Past Medical History:  Diagnosis Date  . Arthritis   . Basal cell carcinoma   . CAD (coronary artery disease)   . Hyperlipidemia   . Pre-diabetes     SURGICAL HISTORY: Past Surgical History:  Procedure Laterality Date  . APPENDECTOMY    . CARDIAC CATHETERIZATION    . COLONOSCOPY    . CORONARY ARTERY BYPASS GRAFT    . open heart surgery      SOCIAL HISTORY: Social History   Socioeconomic History  . Marital status: Married    Spouse name: Not on file  . Number of children: Not on file  . Years of education: Not on file  . Highest education level: Not on file  Occupational History  . Not on file  Tobacco Use  . Smoking status: Never Smoker  . Smokeless tobacco: Never Used  Substance and Sexual Activity  . Alcohol use: Not Currently    Alcohol/week: 0.0 standard drinks  . Drug use: Never  . Sexual activity: Not Currently  Other Topics Concern  . Not on file  Social History Narrative  . Not on file   Social Determinants of Health   Financial  Resource Strain:   . Difficulty of Paying Living Expenses:   Food Insecurity:   . Worried About Charity fundraiser in the Last Year:   . Arboriculturist in the Last Year:   Transportation Needs:   . Film/video editor (Medical):   [name] Kitchen Lack of Transportation (Non-Medical):   Physical Activity:   . Days  of Exercise per Week:   . Minutes of Exercise per Session:   Stress:   . Feeling of Stress :   Social Connections:   . Frequency of Communication with Friends and Family:   . Frequency of Social Gatherings with Friends and Family:   . Attends Religious Services:   . Active Member of Clubs or Organizations:   . Attends Archivist Meetings:   [name] Kitchen Marital Status:   Intimate Partner Violence:   . Fear of Current or Ex-Partner:   . Emotionally Abused:   [name] Kitchen Physically Abused:   . Sexually Abused:     FAMILY HISTORY: Family History  Problem Relation Age of Onset  . Lung disease Father     ALLERGIES:  has No Known Allergies.  MEDICATIONS:  Current Outpatient Medications  Medication Sig Dispense Refill  . atorvastatin (LIPITOR) 20 MG tablet Take 20 mg by mouth every evening.     . naproxen sodium (ALEVE) 220 MG tablet Take 220 mg by mouth daily as needed.    . temazepam (RESTORIL) 30 MG capsule Take 30 mg by mouth at bedtime.      No current facility-administered medications for this visit.     PHYSICAL EXAMINATION: ECOG PERFORMANCE STATUS: 0 - Asymptomatic Vitals:   09/28/19 1314  BP: (!) 150/73  Pulse: 67  Resp: 18  Temp: (!) 97.3 F (36.3 C)   Filed Weights   09/28/19 1314  Weight: 228 lb 11.2 oz (103.7 kg)    Physical Exam Constitutional:      General: He is not in acute distress. HENT:     Head: Normocephalic and atraumatic.  Eyes:     General: No scleral icterus. Cardiovascular:     Rate and Rhythm: Normal rate and regular rhythm.     Heart sounds: Normal heart sounds.  Pulmonary:     Effort: Pulmonary effort is normal. No respiratory distress.     Breath sounds: No wheezing.  Abdominal:     General: Bowel sounds are normal. There is no distension.     Palpations: Abdomen is soft.  Musculoskeletal:        General: No deformity. Normal range of motion.     Cervical back: Normal range of motion and neck supple.  Skin:    General: Skin is warm  and dry.     Findings: No erythema or rash.  Neurological:     Mental Status: He is alert and oriented to person, place, and time. Mental status is at baseline.     Cranial Nerves: No cranial nerve deficit.     Coordination: Coordination normal.  Psychiatric:        Mood and Affect: Mood normal.     LABORATORY DATA:  I have reviewed the data as listed Lab Results  Component Value Date   WBC 12.0 (H) 09/21/2019   HGB 12.6 (L) 09/21/2019   HCT 36.0 (L) 09/21/2019   MCV 96.8 09/21/2019   PLT 183 09/21/2019   Recent Labs    09/06/19 1200 09/21/19 0626  NA 139 139  K 4.1 3.9  CL 103 108  CO2  26 24  GLUCOSE 121* 116*  BUN 12 12  CREATININE 1.10 1.17  CALCIUM 9.2 8.3*  GFRNONAA >60 >60  GFRAA >60 >60  PROT 8.2*  --   ALBUMIN 4.4  --   AST 23  --   ALT 26  --   ALKPHOS 73  --   BILITOT 0.9  --    Iron/TIBC/Ferritin/ %Sat    Component Value Date/Time   IRON 100 09/06/2019 1200   TIBC 368 09/06/2019 1200   FERRITIN 198 09/06/2019 1200   IRONPCTSAT 27 09/06/2019 1200      RADIOGRAPHIC STUDIES: I have personally reviewed the radiological images as listed and agreed with the findings in the report. CT CHEST W CONTRAST  Result Date: 09/07/2019 CLINICAL DATA:  Sigmoid colon mass. Staging colon cancer. EXAM: CT CHEST, ABDOMEN, AND PELVIS WITH CONTRAST TECHNIQUE: Multidetector CT imaging of the chest, abdomen and pelvis was performed following the standard protocol during bolus administration of intravenous contrast. CONTRAST:  121m OMNIPAQUE IOHEXOL 300 MG/ML  SOLN COMPARISON:  01/26/2010. FINDINGS: CT CHEST FINDINGS Cardiovascular: Previous median sternotomy and CABG procedure. No pericardial effusion identified. Mediastinum/Nodes: No enlarged mediastinal, hilar, or axillary lymph nodes. Thyroid gland, trachea, and esophagus demonstrate no significant findings. Lungs/Pleura: No pleural effusion identified. The bilateral lower lung zone predominant peripheral reticular and  nodular densities are identified which have progressed from 01/26/2010 calcified granuloma identified in the posteromedial left lower lobe. No suspicious pulmonary nodule or mass identified. Musculoskeletal: No chest wall mass or suspicious bone lesions identified. CT ABDOMEN PELVIS FINDINGS Hepatobiliary: No focal liver abnormality is seen. Status post cholecystectomy. No biliary dilatation. Pancreas: Unremarkable. No pancreatic ductal dilatation or surrounding inflammatory changes. Spleen: Normal in size without focal abnormality. Adrenals/Urinary Tract: Normal appearance of the adrenal glands. 8.6 cm left kidney cyst. Hyperdense, exophytic lesion arising from the lower pole of left kidney is noted measuring 63 Hounsfield units. This measures 1.6 x 0.8 cm and is indeterminate. Previously this measured fluid attenuation. Urinary bladder is unremarkable. Stomach/Bowel: Stomach is within normal limits. Appendix appears normal. No evidence of bowel wall thickening, distention, or inflammatory changes. The stomach is nondistended. No small bowel wall thickening, inflammation or distension. The appendix is visualized and appears normal. Colonic diverticulosis identified. The primary sigmoid colon mass is difficult to visualized. No obstructing mass noted. Vascular/Lymphatic: Aortic atherosclerosis. No aneurysm. No abdominal or pelvic adenopathy. No inguinal adenopathy. Reproductive: Prostate is unremarkable. Other: Bilateral fat containing inguinal hernias. No ascites or focal fluid collections. Musculoskeletal: Degenerative disc disease identified within the lumbar spine. No acute or suspicious bone lesions. IMPRESSION: 1. No acute findings within the chest, abdomen or pelvis. The primary sigmoid colon mass is difficult to visualized. No obstructing mass noted. 2. No specific findings identified to suggest metastatic disease. 3. Aortic atherosclerosis. 4. Bilateral lower lung zone predominant peripheral reticular and  nodular densities are identified which have progressed from 01/26/2010 and may reflect interstitial lung disease. Consider further evaluation with nonemergent high-resolution CT of the chest. 5. Hyperdense, exophytic lesion arising from the lower pole of left kidney is noted. This is increased in density this may represent a hemorrhagic or proteinaceous cyst. Consider more definitive characterization with renal protocol MRI without and with contrast material. Aortic Atherosclerosis (ICD10-I70.0). Electronically Signed   By: TKerby MoorsM.D.   On: 09/07/2019 09:16   CT ABDOMEN PELVIS W CONTRAST  Result Date: 09/07/2019 CLINICAL DATA:  Sigmoid colon mass. Staging colon cancer. EXAM: CT CHEST, ABDOMEN, AND PELVIS WITH CONTRAST TECHNIQUE: Multidetector  CT imaging of the chest, abdomen and pelvis was performed following the standard protocol during bolus administration of intravenous contrast. CONTRAST:  171m OMNIPAQUE IOHEXOL 300 MG/ML  SOLN COMPARISON:  01/26/2010. FINDINGS: CT CHEST FINDINGS Cardiovascular: Previous median sternotomy and CABG procedure. No pericardial effusion identified. Mediastinum/Nodes: No enlarged mediastinal, hilar, or axillary lymph nodes. Thyroid gland, trachea, and esophagus demonstrate no significant findings. Lungs/Pleura: No pleural effusion identified. The bilateral lower lung zone predominant peripheral reticular and nodular densities are identified which have progressed from 01/26/2010 calcified granuloma identified in the posteromedial left lower lobe. No suspicious pulmonary nodule or mass identified. Musculoskeletal: No chest wall mass or suspicious bone lesions identified. CT ABDOMEN PELVIS FINDINGS Hepatobiliary: No focal liver abnormality is seen. Status post cholecystectomy. No biliary dilatation. Pancreas: Unremarkable. No pancreatic ductal dilatation or surrounding inflammatory changes. Spleen: Normal in size without focal abnormality. Adrenals/Urinary Tract: Normal  appearance of the adrenal glands. 8.6 cm left kidney cyst. Hyperdense, exophytic lesion arising from the lower pole of left kidney is noted measuring 63 Hounsfield units. This measures 1.6 x 0.8 cm and is indeterminate. Previously this measured fluid attenuation. Urinary bladder is unremarkable. Stomach/Bowel: Stomach is within normal limits. Appendix appears normal. No evidence of bowel wall thickening, distention, or inflammatory changes. The stomach is nondistended. No small bowel wall thickening, inflammation or distension. The appendix is visualized and appears normal. Colonic diverticulosis identified. The primary sigmoid colon mass is difficult to visualized. No obstructing mass noted. Vascular/Lymphatic: Aortic atherosclerosis. No aneurysm. No abdominal or pelvic adenopathy. No inguinal adenopathy. Reproductive: Prostate is unremarkable. Other: Bilateral fat containing inguinal hernias. No ascites or focal fluid collections. Musculoskeletal: Degenerative disc disease identified within the lumbar spine. No acute or suspicious bone lesions. IMPRESSION: 1. No acute findings within the chest, abdomen or pelvis. The primary sigmoid colon mass is difficult to visualized. No obstructing mass noted. 2. No specific findings identified to suggest metastatic disease. 3. Aortic atherosclerosis. 4. Bilateral lower lung zone predominant peripheral reticular and nodular densities are identified which have progressed from 01/26/2010 and may reflect interstitial lung disease. Consider further evaluation with nonemergent high-resolution CT of the chest. 5. Hyperdense, exophytic lesion arising from the lower pole of left kidney is noted. This is increased in density this may represent a hemorrhagic or proteinaceous cyst. Consider more definitive characterization with renal protocol MRI without and with contrast material. Aortic Atherosclerosis (ICD10-I70.0). Electronically Signed   By: TKerby MoorsM.D.   On: 09/07/2019  09:16      ASSESSMENT & PLAN:  1. Rectal cancer ([place]   2. Goals of care, counseling/discussion    #Rectal cancer Pathology report was reviewed by me and discussed with patient and his wife. I also called pathology and discussed with Dr. RReuel Derby The tumor is located 1.5 cm distal to the rectosigmoid junction.  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  I explained to patient the different final diagnosis of cancer of rectum rather than endoscopic impression of distal sigmoid colon cancer.  Tumor was not visualized on preop CT scan.  -pT3 pN1 M0, grade 2 adenocarcinoma. MMR status is pending.  All margins were uninvolved by invasive carcinoma, high-grade dysplasia/intramucosal adenocarcinoma and low-grade dysplastic.  1 out of 8 regional lymph nodes were positive for malignancy. Patient has lymphovascular invasion as well as perineural invasion. I discussed with patient that ideally his rectal cancer would be better treated with total neoadjuvant treatment prior to the surgery if we would know his final diagnosis will be an  upper rectal cancer.  Preoperative treatments is associated with a more favorable long-term toxicity profile and fever local recurrence then postoperative therapy.  However overall survival appears to be similar. In this case, I would recommend chemotherapy and adjuvant chemo- radiation.-We will need to discuss further about his case on tumor board.  The diagnosis and care plan were discussed with patient in detail.   The goal of treatment is with curative intent.  Chemotherapy education was provided.  We had discussed the composition of chemotherapy regimen, length of chemo cycle, duration of treatment and the time to assess response to treatment.   I explained to patient and discussed about the risk and benefits of Xeloda concurrently with radiation. I explained to the patient the risks and benefits of systemic chemotherapy FOLFOX including all but not  limited to hair loss, mouth sore, nausea, vomiting, diarrhea, low blood counts, bleeding, neuropathy and risk of life threatening infection and even death, secondary malignancy etc.  . Patient wants to further discussed with Dr. Peyton Najjar regarding adjuvant treatment options.  He wants to consider and update me about his decision of proceeding with chemotherapy and radiation. Currently appointment is to be determined based on his decision.  I discussed with Dr. Peyton Najjar about the recommendation. I discussed above and also called patient after I spoke with pathology. Supportive care measures are necessary for patient well-being and will be provided as necessary. We spent sufficient time to discuss many aspect of care, questions were answered to patient's satisfaction.  -Renal cyst, patient will need MRI abdomen with and without contrast for further evaluation renal cyst in the future -Bilateral lung zone reticular nodular densities-questionable interstitial lung disease.  Discussed with the patient and encourage him to further discuss with primary care provider for the need of see pulmonology All questions were answered. The patient knows to call the clinic with any problems questions or concerns.  Return of visit: To be determined Earlie Server, MD, PhD Hematology Oncology [facility] Pager- [phone] 09/28/2019

## 2019-09-28 NOTE — Progress Notes (Signed)
START ON PATHWAY REGIMEN - Colorectal     A cycle is every 14 days:     Oxaliplatin      Leucovorin      Fluorouracil      Fluorouracil   **Always confirm dose/schedule in your pharmacy ordering system**  Patient Characteristics: Postoperative without Neoadjuvant Therapy (Pathologic Staging), Rectal, pT4, pN0  or  Any pT, pN+ Tumor Location: Rectal Therapeutic Status: Postoperative without Neoadjuvant Therapy (Pathologic Staging) AJCC M Category: cM0 AJCC T Category: pT3 AJCC N Category: pN1 AJCC 8 Stage Grouping: IIIB Intent of Therapy: Curative Intent, Discussed with Patient

## 2019-09-28 NOTE — Progress Notes (Signed)
Patient had his surgery 1 week ago and denies problems/concerns today.  He does have some soreness and tenderness.

## 2019-09-29 LAB — SURGICAL PATHOLOGY

## 2019-09-30 ENCOUNTER — Ambulatory Visit: Payer: Self-pay | Admitting: Dermatology

## 2019-10-05 ENCOUNTER — Other Ambulatory Visit: Payer: Self-pay | Admitting: Oncology

## 2019-10-05 ENCOUNTER — Telehealth: Payer: Self-pay

## 2019-10-05 DIAGNOSIS — C20 Malignant neoplasm of rectum: Secondary | ICD-10-CM

## 2019-10-05 MED ORDER — PROCHLORPERAZINE MALEATE 10 MG PO TABS: 10.0000 mg | ORAL_TABLET | Freq: Four times a day (QID) | ORAL | 1 refills | Status: DC | PRN

## 2019-10-05 MED ORDER — LIDOCAINE-PRILOCAINE 2.5-2.5 % EX CREA: TOPICAL_CREAM | CUTANEOUS | 3 refills | Status: DC

## 2019-10-05 NOTE — Telephone Encounter (Signed)
Done...   All appts has been scheduled as requested.. I was unable to reach pt by phone but a detailed message was left on his vmail making him aware of all sched appts.  Location,  date and times. A NEW appt reminder letter will be mail out also.

## 2019-10-05 NOTE — Telephone Encounter (Signed)
Ellison Hughs please schedule chemo class and tx as requested. Folfox is *NEW* and will be dc pump on day 3. Please notify pt. Thanks

## 2019-10-05 NOTE — Telephone Encounter (Signed)
-----  Message from Earlie Server, MD sent at 10/05/2019  1:11 PM EDT ----- He has talked to surgery and agreed with chemotherapy.  He will get medi port placed by Dr.Cintron.  Please arrange him to have chemotherapy class for FOLFOX.  Plan lab md folfox week of 6/14 thanks.

## 2019-10-06 ENCOUNTER — Ambulatory Visit: Payer: Self-pay | Admitting: General Surgery

## 2019-10-06 NOTE — H&P (Signed)
HISTORY OF PRESENT ILLNESS:    Garrett Peterson is a 74 y.o.male patient who comes for follow up after partial colectomy with anastomosis due to rectal cancer.  Patient status post partial colectomy and anastomosis.  He tolerated the procedure well.  He recovering well.  Final pathology shows rectal cancer with 1 positive lymph node.  Patient reports feeling well.  He reports tolerating diet.  He is passing gas and having bowel movement.  Pain controlled.      PAST MEDICAL HISTORY:      Past Medical History:  Diagnosis Date  . Hx of adenomatous colonic polyps 07/15/2018  . Hyperlipidemia 07/15/2018        PAST SURGICAL HISTORY:   Past Surgical History:  Procedure Laterality Date  . APPENDECTOMY     around 2006 --- @ [facility] - Dr Bary Castilla  . COLONOSCOPY  03/13/2010   Adenomatous Polyp  . COLONOSCOPY  07/07/2013   Adenomatous Polyp: [facility] 06/2018: Recall ltr mailed   . COLONOSCOPY  09/03/2019   Adenocarcinoma, moderately differentiated/No Repeat needed at this time/TKT  . OPEN HEART SURGERY      December 01 2008  . ROBOTIC COLECTOMY PARTIAL  09/20/2019   Dr Lesli Albee         MEDICATIONS:  Encounter Medications        Outpatient Encounter Medications as of 10/05/2019  Medication Sig Dispense Refill  . atorvastatin (LIPITOR) 20 MG tablet Take 20 mg by mouth once daily.    . naproxen sodium (ALEVE) 220 MG tablet Take by mouth as needed    . neomycin 500 mg tablet Take 2 tablets at 2 pm, 3 pm and 10 pm the day before surgery. 6 tablet 0  . temazepam (RESTORIL) 30 mg capsule Take 30 mg by mouth nightly as needed for Sleep     No facility-administered encounter medications on file as of 10/05/2019.       ALLERGIES:   Patient has no known allergies.   SOCIAL HISTORY:  Social History     Socioeconomic History  . Marital status: Married    Spouse name: Not on file  . Number of children: Not on file  . Years of education: Not on file  . Highest education  level: Not on file  Occupational History  . Not on file  Tobacco Use  . Smoking status: Never Smoker  . Smokeless tobacco: Never Used  Vaping Use  . Vaping Use: Never assessed  Substance and Sexual Activity  . Alcohol use: Yes    Alcohol/week: 0.0 standard drinks  . Drug use: Never  . Sexual activity: Defer    Partners: Female  Other Topics Concern  . Not on file  Social History Narrative  . Not on file   Social Determinants of Health      Financial Resource Strain:   . Difficulty of Paying Living Expenses:   Food Insecurity:   . Worried About Charity fundraiser in the Last Year:   . Arboriculturist in the Last Year:   Transportation Needs:   . Film/video editor (Medical):   [name] Kitchen Lack of Transportation (Non-Medical):       FAMILY HISTORY:       Family History  Problem Relation Age of Onset  . Lung cancer Mother   . Broken bones Father      GENERAL REVIEW OF SYSTEMS:   General ROS: negative for - chills, fatigue, fever, weight gain or weight loss Allergy and Immunology ROS: negative  for - hives  Hematological and Lymphatic ROS: negative for - bleeding problems or bruising, negative for palpable nodes Endocrine ROS: negative for - heat or cold intolerance, hair changes Respiratory ROS: negative for - cough, shortness of breath or wheezing Cardiovascular ROS: no chest pain or palpitations GI ROS: negative for nausea, vomiting, abdominal pain, diarrhea, constipation Musculoskeletal ROS: negative for - joint swelling or muscle pain Neurological ROS: negative for - confusion, syncope Dermatological ROS: negative for pruritus and rash  PHYSICAL EXAM:     Vitals:   10/05/19 1024  BP: 168/78  Pulse: 70  .  Ht:172.7 cm (_0 ) Wt:(!) 101.6 kg (224 lb) IVH:SJWT surface area is 2.21 meters squared. Body mass index is 34.06 kg/m.[name] Kitchen   GENERAL: Alert, active, oriented x3  HEENT: Pupils equal reactive to light. Extraocular movements are intact.  Sclera clear. Palpebral conjunctiva normal red color.Pharynx clear.  NECK: Supple with no palpable mass and no adenopathy.  LUNGS: Sound clear with no rales rhonchi or wheezes.  HEART: Regular rhythm S1 and S2 without murmur.  ABDOMEN: Soft and depressible, nontender with no palpable mass, no hepatomegaly. Wounds dry and clean.  EXTREMITIES: Well-developed well-nourished symmetrical with no dependent edema.  NEUROLOGICAL: Awake alert oriented, facial expression symmetrical, moving all extremities.      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 -Patient was evaluated by medical oncology who recommended adjuvant chemotherapy -Patient was oriented about the insertion of Port-A-Cath procedure.  Patient oriented about the benefit of the Chemo-Port.  Patient oriented about risk of surgery that includes bleeding, infection, pneumothorax, hemothorax, arteriovenous fistula, among others.  Patient understood and agreed to proceed.          PLAN:  1. Insertion of Port a Cath [phone], [id number], [id number]) 2. Do not take aspirin 5 days before surgery 3. Contact us if has any question or concern.   Patient and his wife verbalized understanding, all questions were answered, and were agreeable with the plan outlined above.   Herbert Pun, MD  Electronically signed by Herbert Pun, MD

## 2019-10-06 NOTE — H&P (View-Only) (Signed)
HISTORY OF PRESENT ILLNESS:    Mr. Clear is a 74 y.o.male patient who comes for follow up after partial colectomy with anastomosis due to rectal cancer.  Patient status post partial colectomy and anastomosis.  He tolerated the procedure well.  He recovering well.  Final pathology shows rectal cancer with 1 positive lymph node.  Patient reports feeling well.  He reports tolerating diet.  He is passing gas and having bowel movement.  Pain controlled.      PAST MEDICAL HISTORY:      Past Medical History:  Diagnosis Date  . Hx of adenomatous colonic polyps 07/15/2018  . Hyperlipidemia 07/15/2018        PAST SURGICAL HISTORY:   Past Surgical History:  Procedure Laterality Date  . APPENDECTOMY     around 2006 --- @ [facility] - Dr Bary Castilla  . COLONOSCOPY  03/13/2010   Adenomatous Polyp  . COLONOSCOPY  07/07/2013   Adenomatous Polyp: [facility] 06/2018: Recall ltr mailed   . COLONOSCOPY  09/03/2019   Adenocarcinoma, moderately differentiated/No Repeat needed at this time/TKT  . OPEN HEART SURGERY      December 01 2008  . ROBOTIC COLECTOMY PARTIAL  09/20/2019   Dr Lesli Albee         MEDICATIONS:  Encounter Medications        Outpatient Encounter Medications as of 10/05/2019  Medication Sig Dispense Refill  . atorvastatin (LIPITOR) 20 MG tablet Take 20 mg by mouth once daily.    . naproxen sodium (ALEVE) 220 MG tablet Take by mouth as needed    . neomycin 500 mg tablet Take 2 tablets at 2 pm, 3 pm and 10 pm the day before surgery. 6 tablet 0  . temazepam (RESTORIL) 30 mg capsule Take 30 mg by mouth nightly as needed for Sleep     No facility-administered encounter medications on file as of 10/05/2019.       ALLERGIES:   Patient has no known allergies.   SOCIAL HISTORY:  Social History     Socioeconomic History  . Marital status: Married    Spouse name: Not on file  . Number of children: Not on file  . Years of education: Not on file  . Highest education  level: Not on file  Occupational History  . Not on file  Tobacco Use  . Smoking status: Never Smoker  . Smokeless tobacco: Never Used  Vaping Use  . Vaping Use: Never assessed  Substance and Sexual Activity  . Alcohol use: Yes    Alcohol/week: 0.0 standard drinks  . Drug use: Never  . Sexual activity: Defer    Partners: Female  Other Topics Concern  . Not on file  Social History Narrative  . Not on file   Social Determinants of Health      Financial Resource Strain:   . Difficulty of Paying Living Expenses:   Food Insecurity:   . Worried About Charity fundraiser in the Last Year:   . Arboriculturist in the Last Year:   Transportation Needs:   . Film/video editor (Medical):   [name] Kitchen Lack of Transportation (Non-Medical):       FAMILY HISTORY:       Family History  Problem Relation Age of Onset  . Lung cancer Mother   . Broken bones Father      GENERAL REVIEW OF SYSTEMS:   General ROS: negative for - chills, fatigue, fever, weight gain or weight loss Allergy and Immunology ROS: negative  for - hives  Hematological and Lymphatic ROS: negative for - bleeding problems or bruising, negative for palpable nodes Endocrine ROS: negative for - heat or cold intolerance, hair changes Respiratory ROS: negative for - cough, shortness of breath or wheezing Cardiovascular ROS: no chest pain or palpitations GI ROS: negative for nausea, vomiting, abdominal pain, diarrhea, constipation Musculoskeletal ROS: negative for - joint swelling or muscle pain Neurological ROS: negative for - confusion, syncope Dermatological ROS: negative for pruritus and rash  PHYSICAL EXAM:     Vitals:   10/05/19 1024  BP: 168/78  Pulse: 70  .  Ht:172.7 cm (_0 ) Wt:(!) 101.6 kg (224 lb) IVH:SJWT surface area is 2.21 meters squared. Body mass index is 34.06 kg/m.[name] Kitchen   GENERAL: Alert, active, oriented x3  HEENT: Pupils equal reactive to light. Extraocular movements are intact.  Sclera clear. Palpebral conjunctiva normal red color.Pharynx clear.  NECK: Supple with no palpable mass and no adenopathy.  LUNGS: Sound clear with no rales rhonchi or wheezes.  HEART: Regular rhythm S1 and S2 without murmur.  ABDOMEN: Soft and depressible, nontender with no palpable mass, no hepatomegaly. Wounds dry and clean.  EXTREMITIES: Well-developed well-nourished symmetrical with no dependent edema.  NEUROLOGICAL: Awake alert oriented, facial expression symmetrical, moving all extremities.      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 -Patient was evaluated by medical oncology who recommended adjuvant chemotherapy -Patient was oriented about the insertion of Port-A-Cath procedure.  Patient oriented about the benefit of the Chemo-Port.  Patient oriented about risk of surgery that includes bleeding, infection, pneumothorax, hemothorax, arteriovenous fistula, among others.  Patient understood and agreed to proceed.          PLAN:  1. Insertion of Port a Cath [phone], [id number], [id number]) 2. Do not take aspirin 5 days before surgery 3. Contact us if has any question or concern.   Patient and his wife verbalized understanding, all questions were answered, and were agreeable with the plan outlined above.   Herbert Pun, MD  Electronically signed by Herbert Pun, MD

## 2019-10-07 ENCOUNTER — Other Ambulatory Visit: Payer: Medicare Other

## 2019-10-07 ENCOUNTER — Encounter
Admission: RE | Admit: 2019-10-07 | Discharge: 2019-10-07 | Disposition: A | Discharge status: Home / Self Care | Payer: Medicare Other | Source: Ambulatory Visit | Attending: General Surgery | Admitting: General Surgery

## 2019-10-07 ENCOUNTER — Other Ambulatory Visit: Payer: Self-pay

## 2019-10-07 DIAGNOSIS — Z01812 Encounter for preprocedural laboratory examination: Secondary | ICD-10-CM | POA: Insufficient documentation

## 2019-10-07 NOTE — Patient Instructions (Signed)
Your procedure is scheduled on: Wed. 6/2 Report to Day Surgery. To find out your arrival time please call [phone] between 1PM - 3PM on Tues 6/1.  Remember: Instructions that are not followed completely may result in serious medical risk,  up to and including death, or upon the discretion of your surgeon and anesthesiologist your  surgery may need to be rescheduled.     _X__ 1. Do not eat food after midnight the night before your procedure.                 No gum chewing or hard candies. You may drink clear liquids up to 2 hours                 before you are scheduled to arrive for your surgery- DO not drink clear                 liquids within 2 hours of the start of your surgery.                 Clear Liquids include:  water, apple juice without pulp, clear Gatorade, G2 or                  Gatorade Zero (avoid Red/Purple/Blue), Black Coffee or Tea (Do not add                 anything to coffee or tea). _____2.   Complete the carbohydrate drink provided to you, 2 hours before arrival.  __X__2.  On the morning of surgery brush your teeth with toothpaste and water, you                may rinse your mouth with mouthwash if you wish.  Do not swallow any toothpaste of mouthwash.     _X__ 3.  No Alcohol for 24 hours before or after surgery.   ___ 4.  Do Not Smoke or use e-cigarettes For 24 Hours Prior to Your Surgery.                 Do not use any chewable tobacco products for at least 6 hours prior to                 Surgery.  ___  5.  Do not use any recreational drugs (marijuana, cocaine, heroin, ecstacy, MDMA or other)                For at least one week prior to your surgery.  Combination of these drugs with anesthesia                May have life threatening results.  ____  6.  Bring all medications with you on the day of surgery if instructed.   _x___  7.  Notify your doctor if there is any change in your medical condition      (cold, fever,  infections).     Do not wear jewelry, make-up, hairpins, clips or nail polish. Do not wear lotions, powders, or perfumes. You may wear deodorant. Do not shave 48 hours prior to surgery. Men may shave face and neck. Do not bring valuables to the hospital.    [facility] is not responsible for any belongings or valuables.  Contacts, dentures or bridgework may not be worn into surgery. Leave your suitcase in the car. After surgery it may be brought to your room. For patients admitted to the hospital, discharge time is determined by your treatment  team.   Patients discharged the day of surgery will not be allowed to drive home.   Make arrangements for someone to be with you for the first 24 hours of your Same Day Discharge.    Please read over the following fact sheets that you were given:    _x___ Take these medicines the morning of surgery with A SIP OF WATER:    1. none  2.   3.   4.  5.  6.  ____ Fleet Enema (as directed)   _x___ Use CHG Soap (or wipes) as directed  ____ Use Benzoyl Peroxide Gel as instructed  ____ Use inhalers on the day of surgery  ____ Stop metformin 2 days prior to surgery    ____ Take 1/2 of usual insulin dose the night before surgery. No insulin the morning          of surgery.   ____ Stop Coumadin/Plavix/aspirin   __x__ Stop Anti-inflammatories naproxen sodium (ALEVE) 220 MG tablet,  ibuprofen and aspirin today   ____ Stop supplements until after surgery.    ____ Bring C-Pap to the hospital.

## 2019-10-08 ENCOUNTER — Other Ambulatory Visit
Admission: RE | Admit: 2019-10-08 | Discharge: 2019-10-08 | Disposition: A | Discharge status: Home / Self Care | Payer: Medicare Other | Source: Ambulatory Visit | Attending: General Surgery | Admitting: General Surgery

## 2019-10-08 DIAGNOSIS — Z01812 Encounter for preprocedural laboratory examination: Secondary | ICD-10-CM | POA: Diagnosis present

## 2019-10-08 DIAGNOSIS — Z20822 Contact with and (suspected) exposure to covid-19: Secondary | ICD-10-CM | POA: Insufficient documentation

## 2019-10-08 NOTE — Progress Notes (Signed)
Tumor Board Documentation  JAHREE DERMODY was presented by Dr Tasia Catchings at our Tumor Board on 10/07/2019, which included representatives from medical oncology, radiation oncology, pathology, radiology, surgical, navigation, pharmacy, internal medicine, research, palliative care, pulmonology.  Irl currently presents as a current patient, for [place], for new positive pathology with history of the following treatments: surgical intervention(s), active survellience.  Additionally, we reviewed previous medical and familial history, history of present illness, and recent lab results along with all available histopathologic and imaging studies. The tumor board considered available treatment options and made the following recommendations: Chemotherapy(Follow chemo with Radiation Therapy)    The following procedures/referrals were also placed: No orders of the defined types were placed in this encounter.   Clinical Trial Status: not discussed   Staging used: AJCC Stage Group  AJCC Staging: T: 3 N: 1a M: 0 Group: Grade 2 Rectal Carcinoma   National site-specific guidelines NCCN were discussed with respect to the case.  Tumor board is a meeting of clinicians from various specialty areas who evaluate and discuss patients for whom a multidisciplinary approach is being considered. Final determinations in the plan of care are those of the provider(s). The responsibility for follow up of recommendations given during tumor board is that of the provider.   Today's extended care, comprehensive team conference, Zerick was not present for the discussion and was not examined.   Multidisciplinary Tumor Board is a multidisciplinary case peer review process.  Decisions discussed in the Multidisciplinary Tumor Board reflect the opinions of the specialists present at the conference without having examined the patient.  Ultimately, treatment and diagnostic decisions rest with the primary provider(s) and the patient.

## 2019-10-09 LAB — SARS CORONAVIRUS 2 (TAT 6-24 HRS): SARS Coronavirus 2: NEGATIVE

## 2019-10-13 ENCOUNTER — Ambulatory Visit: Payer: Medicare Other

## 2019-10-13 ENCOUNTER — Other Ambulatory Visit: Payer: Self-pay | Admitting: General Surgery

## 2019-10-13 ENCOUNTER — Ambulatory Visit: Payer: Medicare Other | Admitting: Certified Registered"

## 2019-10-13 ENCOUNTER — Other Ambulatory Visit (HOSPITAL_COMMUNITY): Payer: Self-pay | Admitting: General Surgery

## 2019-10-13 ENCOUNTER — Encounter
Admission: RE | Disposition: A | Discharge status: Home / Self Care | Payer: Self-pay | Source: Home / Self Care | Attending: General Surgery

## 2019-10-13 ENCOUNTER — Other Ambulatory Visit: Payer: Self-pay

## 2019-10-13 ENCOUNTER — Ambulatory Visit
Admission: RE | Admit: 2019-10-13 | Discharge: 2019-10-13 | Disposition: A | Discharge status: Home / Self Care | Payer: Medicare Other | Attending: General Surgery | Admitting: General Surgery

## 2019-10-13 ENCOUNTER — Encounter: Payer: Self-pay | Admitting: General Surgery

## 2019-10-13 DIAGNOSIS — K9189 Other postprocedural complications and disorders of digestive system: Secondary | ICD-10-CM

## 2019-10-13 DIAGNOSIS — E785 Hyperlipidemia, unspecified: Secondary | ICD-10-CM | POA: Diagnosis not present

## 2019-10-13 DIAGNOSIS — Z95828 Presence of other vascular implants and grafts: Secondary | ICD-10-CM

## 2019-10-13 DIAGNOSIS — Z79899 Other long term (current) drug therapy: Secondary | ICD-10-CM | POA: Insufficient documentation

## 2019-10-13 DIAGNOSIS — C187 Malignant neoplasm of sigmoid colon: Secondary | ICD-10-CM | POA: Diagnosis not present

## 2019-10-13 HISTORY — PX: PORTACATH PLACEMENT: SHX2246

## 2019-10-13 SURGERY — INSERTION, TUNNELED CENTRAL VENOUS DEVICE, WITH PORT
Anesthesia: General | Site: Chest | Laterality: Right

## 2019-10-13 MED ORDER — GLYCOPYRROLATE 0.2 MG/ML IJ SOLN
INTRAMUSCULAR | Status: DC | PRN
  Administered 2019-10-13: .2 mg via INTRAVENOUS

## 2019-10-13 MED ORDER — MIDAZOLAM HCL 2 MG/2ML IJ SOLN
INTRAMUSCULAR | Status: AC
  Filled 2019-10-13: qty 2

## 2019-10-13 MED ORDER — IOHEXOL 300 MG/ML  SOLN
100.0000 mL | Freq: Once | INTRAMUSCULAR | Status: AC | PRN
  Administered 2019-10-13: 100 mL via INTRAVENOUS

## 2019-10-13 MED ORDER — HYDROCODONE-ACETAMINOPHEN 5-325 MG PO TABS: 1.0000 | ORAL_TABLET | ORAL | 0 refills | Status: AC | PRN

## 2019-10-13 MED ORDER — ONDANSETRON HCL 4 MG/2ML IJ SOLN: 4.0000 mg | Freq: Once | INTRAMUSCULAR | Status: DC | PRN

## 2019-10-13 MED ORDER — IOHEXOL 9 MG/ML PO SOLN: 500.0000 mL | ORAL | Status: AC

## 2019-10-13 MED ORDER — ONDANSETRON HCL 4 MG/2ML IJ SOLN
INTRAMUSCULAR | Status: DC | PRN
  Administered 2019-10-13: 4 mg via INTRAVENOUS

## 2019-10-13 MED ORDER — CHLORHEXIDINE GLUCONATE 0.12 % MT SOLN
OROMUCOSAL | Status: AC
  Filled 2019-10-13: qty 15

## 2019-10-13 MED ORDER — MIDAZOLAM HCL 2 MG/2ML IJ SOLN
INTRAMUSCULAR | Status: DC | PRN
  Administered 2019-10-13 (×2): 1 mg via INTRAVENOUS

## 2019-10-13 MED ORDER — PROPOFOL 10 MG/ML IV BOLUS
INTRAVENOUS | Status: AC
  Filled 2019-10-13: qty 20

## 2019-10-13 MED ORDER — DEXAMETHASONE SODIUM PHOSPHATE 10 MG/ML IJ SOLN
INTRAMUSCULAR | Status: DC | PRN
  Administered 2019-10-13: 10 mg via INTRAVENOUS

## 2019-10-13 MED ORDER — PROPOFOL 500 MG/50ML IV EMUL
INTRAVENOUS | Status: AC
  Filled 2019-10-13: qty 50

## 2019-10-13 MED ORDER — CEFAZOLIN SODIUM-DEXTROSE 2-4 GM/100ML-% IV SOLN
2.0000 g | INTRAVENOUS | Status: AC
  Administered 2019-10-13: 2 g via INTRAVENOUS

## 2019-10-13 MED ORDER — HEPARIN SODIUM (PORCINE) 5000 UNIT/ML IJ SOLN
INTRAMUSCULAR | Status: AC
  Filled 2019-10-13: qty 1

## 2019-10-13 MED ORDER — SODIUM CHLORIDE (PF) 0.9 % IJ SOLN
INTRAMUSCULAR | Status: AC
  Filled 2019-10-13: qty 50

## 2019-10-13 MED ORDER — SODIUM CHLORIDE 0.9 % IV SOLN
INTRAVENOUS | Status: DC | PRN
  Administered 2019-10-13: 3 mL via INTRAMUSCULAR

## 2019-10-13 MED ORDER — FENTANYL CITRATE (PF) 100 MCG/2ML IJ SOLN: 25.0000 ug | INTRAMUSCULAR | Status: DC | PRN

## 2019-10-13 MED ORDER — FAMOTIDINE 20 MG PO TABS
ORAL_TABLET | ORAL | Status: AC
  Filled 2019-10-13: qty 1

## 2019-10-13 MED ORDER — FAMOTIDINE 20 MG PO TABS
20.0000 mg | ORAL_TABLET | Freq: Once | ORAL | Status: AC
  Administered 2019-10-13: 20 mg via ORAL

## 2019-10-13 MED ORDER — EPHEDRINE 5 MG/ML INJ
INTRAVENOUS | Status: AC
  Filled 2019-10-13: qty 10

## 2019-10-13 MED ORDER — CHLORHEXIDINE GLUCONATE 0.12 % MT SOLN
15.0000 mL | Freq: Once | OROMUCOSAL | Status: AC
  Administered 2019-10-13: 15 mL via OROMUCOSAL

## 2019-10-13 MED ORDER — ORAL CARE MOUTH RINSE: 15.0000 mL | Freq: Once | OROMUCOSAL | Status: AC

## 2019-10-13 MED ORDER — CEFAZOLIN SODIUM-DEXTROSE 2-4 GM/100ML-% IV SOLN
INTRAVENOUS | Status: AC
  Filled 2019-10-13: qty 100

## 2019-10-13 MED ORDER — EPINEPHRINE PF 1 MG/ML IJ SOLN
INTRAMUSCULAR | Status: AC
  Filled 2019-10-13: qty 1

## 2019-10-13 MED ORDER — EPHEDRINE SULFATE 50 MG/ML IJ SOLN
INTRAMUSCULAR | Status: DC | PRN
  Administered 2019-10-13 (×2): 5 mg via INTRAVENOUS

## 2019-10-13 MED ORDER — AMOXICILLIN-POT CLAVULANATE 875-125 MG PO TABS: 1.0000 | ORAL_TABLET | Freq: Two times a day (BID) | ORAL | 0 refills | Status: AC

## 2019-10-13 MED ORDER — PROPOFOL 500 MG/50ML IV EMUL
INTRAVENOUS | Status: DC | PRN
  Administered 2019-10-13: 125 ug/kg/min via INTRAVENOUS

## 2019-10-13 MED ORDER — SODIUM CHLORIDE FLUSH 0.9 % IV SOLN
INTRAVENOUS | Status: AC
  Filled 2019-10-13: qty 10

## 2019-10-13 MED ORDER — FENTANYL CITRATE (PF) 100 MCG/2ML IJ SOLN
INTRAMUSCULAR | Status: AC
  Filled 2019-10-13: qty 2

## 2019-10-13 MED ORDER — FENTANYL CITRATE (PF) 100 MCG/2ML IJ SOLN
INTRAMUSCULAR | Status: DC | PRN
  Administered 2019-10-13: 15 ug via INTRAVENOUS
  Administered 2019-10-13: 25 ug via INTRAVENOUS

## 2019-10-13 MED ORDER — BUPIVACAINE-EPINEPHRINE (PF) 0.25% -1:200000 IJ SOLN
INTRAMUSCULAR | Status: DC | PRN
  Administered 2019-10-13: 7 mL

## 2019-10-13 MED ORDER — BUPIVACAINE HCL (PF) 0.25 % IJ SOLN
INTRAMUSCULAR | Status: AC
  Filled 2019-10-13: qty 30

## 2019-10-13 MED ORDER — LACTATED RINGERS IV SOLN: INTRAVENOUS | Status: DC

## 2019-10-13 SURGICAL SUPPLY — 34 items
BAG DECANTER FOR FLEXI CONT (MISCELLANEOUS) ×2 IMPLANT
BLADE SURG SZ11 CARB STEEL (BLADE) ×2 IMPLANT
BOOT SUTURE AID YELLOW STND (SUTURE) ×2 IMPLANT
CANISTER SUCT 1200ML W/VALVE (MISCELLANEOUS) ×2 IMPLANT
CHLORAPREP W/TINT 26 (MISCELLANEOUS) ×2 IMPLANT
COVER LIGHT HANDLE STERIS (MISCELLANEOUS) ×4 IMPLANT
COVER WAND RF STERILE (DRAPES) ×2 IMPLANT
DERMABOND ADVANCED (GAUZE/BANDAGES/DRESSINGS) ×1
DERMABOND ADVANCED .7 DNX12 (GAUZE/BANDAGES/DRESSINGS) ×1 IMPLANT
DRAPE C-ARM XRAY 36X54 (DRAPES) ×2 IMPLANT
ELECT REM PT RETURN 9FT ADLT (ELECTROSURGICAL) ×2
ELECTRODE REM PT RTRN 9FT ADLT (ELECTROSURGICAL) ×1 IMPLANT
GLOVE BIO SURGEON STRL SZ 6.5 (GLOVE) ×2 IMPLANT
GLOVE BIOGEL PI IND STRL 6.5 (GLOVE) ×1 IMPLANT
GLOVE BIOGEL PI INDICATOR 6.5 (GLOVE) ×1
GOWN STRL REUS W/ TWL LRG LVL3 (GOWN DISPOSABLE) ×3 IMPLANT
GOWN STRL REUS W/TWL LRG LVL3 (GOWN DISPOSABLE) ×6
IV NS 500ML (IV SOLUTION) ×2
IV NS 500ML BAXH (IV SOLUTION) ×1 IMPLANT
KIT PORT POWER 8FR ISP CVUE (Port) ×2 IMPLANT
KIT TURNOVER KIT A (KITS) ×2 IMPLANT
LABEL OR SOLS (LABEL) ×2 IMPLANT
NDL FILTER BLUNT 18X1 1/2 (NEEDLE) ×1 IMPLANT
NEEDLE FILTER BLUNT 18X 1/2SAF (NEEDLE) ×1
NEEDLE FILTER BLUNT 18X1 1/2 (NEEDLE) ×1 IMPLANT
PACK PORT-A-CATH (MISCELLANEOUS) ×2 IMPLANT
SUT MNCRL AB 4-0 PS2 18 (SUTURE) ×2 IMPLANT
SUT PROLENE 2 0 FS (SUTURE) ×2 IMPLANT
SUT VIC AB 2-0 SH 27 (SUTURE) ×2
SUT VIC AB 2-0 SH 27XBRD (SUTURE) ×1 IMPLANT
SUT VIC AB 3-0 SH 27 (SUTURE) ×2
SUT VIC AB 3-0 SH 27X BRD (SUTURE) ×1 IMPLANT
SYR 10ML LL (SYRINGE) ×2 IMPLANT
SYR 3ML LL SCALE MARK (SYRINGE) ×2 IMPLANT

## 2019-10-13 NOTE — Anesthesia Procedure Notes (Addendum)
Procedure Name: General with mask airway Performed by: Kelton Pillar, CRNA Pre-anesthesia Checklist: Patient identified, Emergency Drugs available, Suction available and Patient being monitored Patient Re-evaluated:Patient Re-evaluated prior to induction Oxygen Delivery Method: Simple face mask Ventilation: Oral airway inserted - appropriate to patient size Dental Injury: Teeth and Oropharynx as per pre-operative assessment

## 2019-10-13 NOTE — Transfer of Care (Signed)
Immediate Anesthesia Transfer of Care Note  Patient: Garrett Peterson  Procedure(s) Performed: INSERTION PORT-A-CATH (Right Chest)  Patient Location: PACU  Anesthesia Type:General  Level of Consciousness: drowsy and responds to stimulation  Airway & Oxygen Therapy: Patient Spontanous Breathing and Patient connected to face mask oxygen  Post-op Assessment: Report given to RN and Post -op Vital signs reviewed and stable  Post vital signs: Reviewed and stable  Last Vitals:  Vitals Value Taken Time  BP 117/59 10/13/19 1000  Temp 36 C 10/13/19 1000  Pulse 67 10/13/19 1010  Resp 15 10/13/19 1010  SpO2 100 % 10/13/19 1010  Vitals shown include unvalidated device data.  Last Pain:  Vitals:   10/13/19 1009  TempSrc:   PainSc: 0-No pain         Complications: No apparent anesthesia complications

## 2019-10-13 NOTE — Progress Notes (Signed)
Awaiting abdominal CT results. Pt stated that he wants to go home.

## 2019-10-13 NOTE — Progress Notes (Signed)
Pt finished drinking the oral contrast. CT called and stated that the would call when we could transfer pt.

## 2019-10-13 NOTE — Interval H&P Note (Signed)
History and Physical Interval Note:  10/13/2019 8:33 AM  Garrett Peterson  has presented today for surgery, with the diagnosis of C18.7 Malignant neoplasm of sigmoid colon.  The various methods of treatment have been discussed with the patient and family. After consideration of risks, benefits and other options for treatment, the patient has consented to  Procedure(s): INSERTION PORT-A-CATH (N/A) as a surgical intervention.  The patient's history has been reviewed, patient examined, no change in status, stable for surgery.  I have reviewed the patient's chart and labs.  Questions were answered to the patient's satisfaction.     Herbert Pun

## 2019-10-13 NOTE — Discharge Instructions (Signed)
AMBULATORY SURGERY  DISCHARGE INSTRUCTIONS   1) The drugs that you were given will stay in your system until tomorrow so for the next 24 hours you should not:  A) Drive an automobile B) Make any legal decisions C) Drink any alcoholic beverage   2) You may resume regular meals tomorrow.  Today it is better to start with liquids and gradually work up to solid foods.  You may eat anything you prefer, but it is better to start with liquids, then soup and crackers, and gradually work up to solid foods.   3) Please notify your doctor immediately if you have any unusual bleeding, trouble breathing, redness and pain at the surgery site, drainage, fever, or pain not relieved by medication.    4) Additional Instructions:        Please contact your physician with any problems or Same Day Surgery at [phone], Monday through Friday 6 am to 4 pm, or [place] at [facility] number at [phone]. Diet: Resume home heart healthy regular diet.   Activity: Increase activity as tolerated. Light activity and walking are encouraged. Do not drive or drink alcohol if taking narcotic pain medications.  Wound care: May shower with soapy water and pat dry (do not rub incisions), but no baths or submerging incision underwater until follow-up. (no swimming)   Medications: Resume all home medications. For mild to moderate pain: acetaminophen (Tylenol) or ibuprofen (if no kidney disease). Combining Tylenol with alcohol can substantially increase your risk of causing liver disease. Narcotic pain medications, if prescribed, can be used for severe pain, though may cause nausea, constipation, and drowsiness. Do not combine Tylenol and Norco within a 6 hour period as Norco contains Tylenol. If you do not need the narcotic pain medication, you do not need to fill the prescription.  Call office [phone]) at any time if any questions, worsening pain, fevers/chills, bleeding, drainage from incision  site, or other concerns.

## 2019-10-13 NOTE — Op Note (Signed)
SURGICAL PROCEDURE REPORT  DATE OF PROCEDURE: 10/13/2019   SURGEON: Dr. Windell Moment   ANESTHESIA: Local with light IV sedation   PRE-OPERATIVE DIAGNOSIS: Advanced Colon cancer requiring durable central venous access for chemotherapy   POST-OPERATIVE DIAGNOSIS: Advanced Colon cancer requiring durable central venous access for chemotherapy   PROCEDURE(S):  1.) Percutaneous access of Right internal jugular vein under ultrasound guidance 2.) Insertion of tunneled Right internal jugular central venous catheter with subcutaneous port  INTRAOPERATIVE FINDINGS: Patent easily compressible Right internal jugular vein with appropriate respiratory variations and well-secured tunneled central venous catheter with subcutaneous port at completion of the procedure  ESTIMATED BLOOD LOSS: Minimal (<20 mL)   SPECIMENS: None   IMPLANTS: 34F tunneled Bard PowerPort central venous catheter with subcutaneous port  DRAINS: None   COMPLICATIONS: None apparent   CONDITION AT COMPLETION: Hemodynamically stable, awake   DISPOSITION: PACU   INDICATION(S) FOR PROCEDURE:  Patient is a 74 y.o. male who presented with advanced colon cancer requiring durable central venous access for chemotherapy. All risks, benefits, and alternatives to above elective procedures were discussed with the patient, who elected to proceed, and informed consent was accordingly obtained at that time.  DETAILS OF PROCEDURE:  Patient was brought to the operative suite and appropriately identified. In Trendelenburg position, Right IJ venous access site was prepped and draped in the usual sterile fashion, and following a brief timeout, percutaneous Right IJ venous access was obtained under ultrasound guidance using Seldinger technique, by which local anesthetic was injected over the Right IJ vein, and access needle was inserted under direct ultrasound visualization into the Right IJ vein, through which soft guidewire was advanced, over  which access needle was withdrawn. Guidewire was secured, attention was directed to injection of local anesthetic along the planned tunnel site, 2-3 cm transverse Right chest incision was made and confirmed to accommodate the subcutaneous port, and flushed catheter was tunneled retrograde from the port site over the Right chest to the Right IJ access site with the attached port well-secured to the catheter and within the subcutaneous pocket. Insertion sheath was advanced over the guidewire, which was withdrawn along with the insertion sheath dilator. The catheter was introduced through the sheath and left on the [place] junction under fluoro guidance and catheter cut to desire lenght. Catheter connected to port and fixed to the pocket on two side to avoid twisting. Port was confirmed to withdraw blood and flush easily, after which concentrated heparin was instilled into the port and catheter. Dermis at the subcutaneous pocket was re-approximated using buried interrupted 3-0 Vicryl suture, and 4-0 Monocryl suture was used to re-approximate skin at the insertion and subcutaneous port sites in running subcuticular fashion for the subcutaneous port and buried interrupted fashion for the insertion site. Skin was cleaned, dried, and sterile skin glue was applied. Patient was then safely transferred to PACU for a chest x-ray. Ultrasound images are available on paper chart and Fluoroscopy guidance images are available in Epic.

## 2019-10-13 NOTE — Progress Notes (Signed)
Pt ready for D/C @ 1050, Dr Windell Moment called and requested abdominal xrays. Abdominal  xrays resulted, CT of the abdomen ordered pt started drinking oral contrast _0 . Wife updated and in room with pt.

## 2019-10-13 NOTE — Anesthesia Postprocedure Evaluation (Signed)
Anesthesia Post Note  Patient: Garrett Peterson  Procedure(s) Performed: INSERTION PORT-A-CATH (Right Chest)  Patient location during evaluation: PACU Anesthesia Type: General Level of consciousness: awake and alert Pain management: pain level controlled Vital Signs Assessment: post-procedure vital signs reviewed and stable Respiratory status: spontaneous breathing and respiratory function stable Cardiovascular status: stable Anesthetic complications: no     Last Vitals:  Vitals:   10/13/19 1000 10/13/19 1009  BP: (!) 117/59   Pulse: (!) 59 62  Resp: 17 (!) 22  Temp: (!) 36 C   SpO2:  100%    Last Pain:  Vitals:   10/13/19 1009  TempSrc:   PainSc: 0-No pain                 Garrett Peterson

## 2019-10-13 NOTE — Anesthesia Preprocedure Evaluation (Signed)
Anesthesia Evaluation  Patient identified by MRN, date of birth, ID band Patient awake    Reviewed: Allergy & Precautions, NPO status , Patient's Chart, lab work & pertinent test results  History of Anesthesia Complications Negative for: history of anesthetic complications  Airway Mallampati: II       Dental   Pulmonary neg sleep apnea, neg COPD, Not current smoker,           Cardiovascular (-) hypertension+ CAD and + CABG  (-) Past MI and (-) CHF (-) dysrhythmias (-) Valvular Problems/Murmurs     Neuro/Psych neg Seizures    GI/Hepatic Neg liver ROS, neg GERD  ,  Endo/Other  neg diabetes  Renal/GU negative Renal ROS     Musculoskeletal   Abdominal   Peds  Hematology   Anesthesia Other Findings   Reproductive/Obstetrics                             Anesthesia Physical Anesthesia Plan  ASA: III  Anesthesia Plan: General   Post-op Pain Management:    Induction: Intravenous  PONV Risk Score and Plan: 2 and Propofol infusion and TIVA  Airway Management Planned: Nasal Cannula  Additional Equipment:   Intra-op Plan:   Post-operative Plan:   Informed Consent: I have reviewed the patients History and Physical, chart, labs and discussed the procedure including the risks, benefits and alternatives for the proposed anesthesia with the patient or authorized representative who has indicated his/her understanding and acceptance.       Plan Discussed with:   Anesthesia Plan Comments:         Anesthesia Quick Evaluation

## 2019-10-13 NOTE — Progress Notes (Signed)
Dr Windell Moment in with pt. Pt to be discharged with oral antibiotics per Dr Windell Moment. VSS Pt D/C'd home via wheelchair accompanied by wife.

## 2019-10-14 ENCOUNTER — Ambulatory Visit: Payer: Medicare Other

## 2019-10-14 ENCOUNTER — Ambulatory Visit
Admission: RE | Admit: 2019-10-14 | Discharge: 2019-10-14 | Disposition: A | Discharge status: Home / Self Care | Payer: Medicare Other | Source: Ambulatory Visit | Attending: General Surgery | Admitting: General Surgery

## 2019-10-14 DIAGNOSIS — K9189 Other postprocedural complications and disorders of digestive system: Secondary | ICD-10-CM | POA: Insufficient documentation

## 2019-10-15 ENCOUNTER — Other Ambulatory Visit: Payer: Self-pay | Admitting: General Surgery

## 2019-10-15 ENCOUNTER — Other Ambulatory Visit: Payer: Self-pay | Admitting: Radiology

## 2019-10-15 DIAGNOSIS — K651 Peritoneal abscess: Secondary | ICD-10-CM

## 2019-10-18 ENCOUNTER — Ambulatory Visit
Admission: RE | Admit: 2019-10-18 | Discharge: 2019-10-18 | Disposition: A | Discharge status: Home / Self Care | Payer: Medicare Other | Source: Ambulatory Visit | Attending: General Surgery | Admitting: General Surgery

## 2019-10-18 ENCOUNTER — Other Ambulatory Visit: Payer: Self-pay

## 2019-10-18 ENCOUNTER — Telehealth: Payer: Self-pay | Admitting: *Deleted

## 2019-10-18 DIAGNOSIS — K651 Peritoneal abscess: Secondary | ICD-10-CM

## 2019-10-18 DIAGNOSIS — R7303 Prediabetes: Secondary | ICD-10-CM | POA: Diagnosis not present

## 2019-10-18 DIAGNOSIS — Z85048 Personal history of other malignant neoplasm of rectum, rectosigmoid junction, and anus: Secondary | ICD-10-CM | POA: Diagnosis not present

## 2019-10-18 DIAGNOSIS — E785 Hyperlipidemia, unspecified: Secondary | ICD-10-CM | POA: Insufficient documentation

## 2019-10-18 DIAGNOSIS — Z79899 Other long term (current) drug therapy: Secondary | ICD-10-CM | POA: Insufficient documentation

## 2019-10-18 DIAGNOSIS — I2510 Atherosclerotic heart disease of native coronary artery without angina pectoris: Secondary | ICD-10-CM | POA: Diagnosis not present

## 2019-10-18 DIAGNOSIS — Z951 Presence of aortocoronary bypass graft: Secondary | ICD-10-CM | POA: Insufficient documentation

## 2019-10-18 DIAGNOSIS — Z85828 Personal history of other malignant neoplasm of skin: Secondary | ICD-10-CM | POA: Insufficient documentation

## 2019-10-18 LAB — CBC
HCT: 38.9 % — ABNORMAL LOW (ref 39.0–52.0)
Hemoglobin: 13.5 g/dL (ref 13.0–17.0)
MCH: 31.8 pg (ref 26.0–34.0)
MCHC: 34.7 g/dL (ref 30.0–36.0)
MCV: 91.7 fL (ref 80.0–100.0)
Platelets: 275 10*3/uL (ref 150–400)
RBC: 4.24 MIL/uL (ref 4.22–5.81)
RDW: 13.2 % (ref 11.5–15.5)
WBC: 10.5 10*3/uL (ref 4.0–10.5)
nRBC: 0 % (ref 0.0–0.2)

## 2019-10-18 LAB — PROTIME-INR
INR: 1.1 (ref 0.8–1.2)
Prothrombin Time: 13.4 seconds (ref 11.4–15.2)

## 2019-10-18 MED ORDER — MIDAZOLAM HCL 2 MG/2ML IJ SOLN
INTRAMUSCULAR | Status: AC | PRN
  Administered 2019-10-18 (×2): 1 mg via INTRAVENOUS

## 2019-10-18 MED ORDER — FENTANYL CITRATE (PF) 100 MCG/2ML IJ SOLN
INTRAMUSCULAR | Status: AC | PRN
  Administered 2019-10-18 (×2): 50 ug via INTRAVENOUS

## 2019-10-18 MED ORDER — SODIUM CHLORIDE 0.9 % IV SOLN: INTRAVENOUS | Status: DC

## 2019-10-18 MED ORDER — MIDAZOLAM HCL 2 MG/2ML IJ SOLN
INTRAMUSCULAR | Status: AC
  Filled 2019-10-18: qty 2

## 2019-10-18 MED ORDER — CEFAZOLIN SODIUM-DEXTROSE 2-4 GM/100ML-% IV SOLN
2.0000 g | Freq: Once | INTRAVENOUS | Status: DC
  Filled 2019-10-18: qty 100

## 2019-10-18 MED ORDER — FENTANYL CITRATE (PF) 100 MCG/2ML IJ SOLN
INTRAMUSCULAR | Status: AC
  Filled 2019-10-18: qty 2

## 2019-10-18 MED ORDER — SODIUM CHLORIDE 0.9% FLUSH: 10.0000 mL | Freq: Every day | INTRAVENOUS | Status: DC

## 2019-10-18 NOTE — Progress Notes (Signed)
Dr. Kathlene Cote in at bedside, speaking with pt. And his spouse [place] re: procedure to be done today (peritoneal drain). Both verbalize understanding of conversation pre procedure.

## 2019-10-18 NOTE — Procedures (Signed)
Interventional Radiology Procedure Note  Procedure: CT Guided Drainage of LLQ fluid collection  Complications: None  Estimated Blood Loss: < 10 mL  Findings: 12 Fr drain placed in LLQ abscess/fluid collection with return of tan colored, thick fluid. Fluid sample sent for culture analysis. Drain attached to suction bulb drainage.  Will follow.  Venetia Night. Kathlene Cote, M.D Pager:  [phone]

## 2019-10-18 NOTE — Telephone Encounter (Addendum)
Per Dr. Tasia Catchings to postpone his chemo appt for 2 weeks, end of this months. Appts were moved out as requested. Called and spoke with pt wife [place] and made her aware of the R/S 10/27/19 date moved out to  11/10/19. She is aware of the scheduled date and time. Port lab/MD/*NEW* Folfox 6/30 and Pump dc on 7/2 @ 145

## 2019-10-18 NOTE — Discharge Instructions (Signed)
Moderate Conscious Sedation, Adult, Care After These instructions provide you with information about caring for yourself after your procedure. Your health care provider may also give you more specific instructions. Your treatment has been planned according to current medical practices, but problems sometimes occur. Call your health care provider if you have any problems or questions after your procedure. What can I expect after the procedure? After your procedure, it is common:  To feel sleepy for several hours.  To feel clumsy and have poor balance for several hours.  To have poor judgment for several hours.  To vomit if you eat too soon. Follow these instructions at home: For at least 24 hours after the procedure:   Do not: ? Participate in activities where you could fall or become injured. ? Drive. ? Use heavy machinery. ? Drink alcohol. ? Take sleeping pills or medicines that cause drowsiness. ? Make important decisions or sign legal documents. ? Take care of children on your own.  Rest. Eating and drinking  Follow the diet recommended by your health care provider.  If you vomit: ? Drink water, juice, or soup when you can drink without vomiting. ? Make sure you have little or no nausea before eating solid foods. General instructions  Have a responsible adult stay with you until you are awake and alert.  Take over-the-counter and prescription medicines only as told by your health care provider.  If you smoke, do not smoke without supervision.  Keep all follow-up visits as told by your health care provider. This is important. Contact a health care provider if:  You keep feeling nauseous or you keep vomiting.  You feel light-headed.  You develop a rash.  You have a fever. Get help right away if:  You have trouble breathing. This information is not intended to replace advice given to you by your health care provider. Make sure you discuss any questions you have  with your health care provider. Document Revised: 04/11/2017 Document Reviewed: 08/19/2015 Elsevier Patient Education  [place]. Percutaneous Abscess Drain, Care After This sheet gives you information about how to care for yourself after your procedure. Your health care provider may also give you more specific instructions. If you have problems or questions, contact your health care provider. What can I expect after the procedure? After your procedure, it is common to have:  A small amount of bruising and discomfort in the area where the drainage tube (catheter) was placed.  Sleepiness and fatigue. This should go away after the medicines you were given have worn off. Follow these instructions at home: Incision care  Follow instructions from your health care provider about how to take care of your incision. Make sure you: ? Wash your hands with soap and water before you change your bandage (dressing). If soap and water are not available, use hand sanitizer. ? Change your dressing as told by your health care provider. ? Leave stitches (sutures), skin glue, or adhesive strips in place. These skin closures may need to stay in place for 2 weeks or longer. If adhesive strip edges start to loosen and curl up, you may trim the loose edges. Do not remove adhesive strips completely unless your health care provider tells you to do that.  Check your incision area every day for signs of infection. Check for: ? More redness, swelling, or pain. ? More fluid or blood. ? Warmth. ? Pus or a bad smell. ? Fluid leaking from around your catheter (instead of fluid draining  through your catheter). Catheter care   Follow instructions from your health care provider about emptying and cleaning your catheter and collection bag. You may need to clean the catheter every day so it does not clog.  If directed, write down the following information every time you empty your bag: ? The date and time. ? The  amount of drainage. General instructions  Rest at home for 1-2 days after your procedure. Return to your normal activities as told by your health care provider.  Do not take baths, swim, or use a hot tub for 24 hours after your procedure, or until your health care provider says that this is okay.  Take over-the-counter and prescription medicines only as told by your health care provider.  Keep all follow-up visits as told by your health care provider. This is important. Contact a health care provider if:  You have less than 10 mL of drainage a day for 2-3 days in a row, or as directed by your health care provider.  You have more redness, swelling, or pain around your incision area.  You have more fluid or blood coming from your incision area.  Your incision area feels warm to the touch.  You have pus or a bad smell coming from your incision area.  You have fluid leaking from around your catheter (instead of through your catheter).  You have a fever or chills.  You have pain that does not get better with medicine. Get help right away if:  Your catheter comes out.  You suddenly stop having drainage from your catheter.  You suddenly have blood in the fluid that is draining from your catheter.  You become dizzy or you faint.  You develop a rash.  You have nausea or vomiting.  You have difficulty breathing or you feel short of breath.  You develop chest pain.  You have problems with your speech or vision.  You have trouble balancing or moving your arms or legs. Summary  It is common to have a small amount of bruising and discomfort in the area where the drainage tube (catheter) was placed.  You may be directed to record the amount of drainage from the bag every time you empty it.  Follow instructions from your health care provider about emptying and cleaning your catheter and collection bag. This information is not intended to replace advice given to you by your  health care provider. Make sure you discuss any questions you have with your health care provider. Document Revised: 04/11/2017 Document Reviewed: 03/21/2016 Elsevier Patient Education  2020 [employer].

## 2019-10-18 NOTE — H&P (Signed)
Chief Complaint: Patient was seen in consultation today for abscess drainage at the request of Cintron-Diaz,Edgardo  Referring Physician(s): Cintron-Diaz,Edgardo  Patient Status: [facility] - Out-pt  History of Present Illness: Garrett Peterson is a 74 y.o. 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 Post op course uncomplicated but noted to have free air by xray after port placement on 10/13/19 which led to CT showing a roughly 4 x 5 cm fluid collection in LLQ and BE on 10/14/19 demonstrating focal contained leak at colonic anastomosis. Now presents for CT guided drainage of fluid collection. There is limited window available to collection on prior CT from percutaneous approach.  Past Medical History:  Diagnosis Date  . Arthritis   . Basal cell carcinoma   . CAD (coronary artery disease)   . Cancer ([place]) 2021   rectal  . Hyperlipidemia   . Pre-diabetes     Past Surgical History:  Procedure Laterality Date  . APPENDECTOMY    . CARDIAC CATHETERIZATION    . COLONOSCOPY    . CORONARY ARTERY BYPASS GRAFT    . open heart surgery    . PORTACATH PLACEMENT Right 10/13/2019   Procedure: INSERTION PORT-A-CATH;  Surgeon: Herbert Pun, MD;  Location: [facility] ORS;  Service: General;  Laterality: Right;    Allergies: Patient has no known allergies.  Medications: Prior to Admission medications   Medication Sig Start Date End Date Taking? Authorizing Provider  amoxicillin-clavulanate (AUGMENTIN) 875-125 MG tablet Take 1 tablet by mouth 2 (two) times daily for 10 days. 10/13/19 10/23/19 Yes Herbert Pun, MD  temazepam (RESTORIL) 30 MG capsule Take 30 mg by mouth at bedtime.  06/09/14  Yes [provider]  atorvastatin (LIPITOR) 20 MG tablet Take 20 mg by mouth every evening.     [provider]  lidocaine-prilocaine (EMLA) cream Apply to affected area once  10/05/19   Earlie Server, MD  naproxen sodium (ALEVE) 220 MG tablet Take 220 mg by mouth daily as needed (Pain).     [provider]  neomycin (MYCIFRADIN) 500 MG tablet Take 1,000 mg by mouth 3 (three) times daily.    [provider]  prochlorperazine (COMPAZINE) 10 MG tablet Take 1 tablet (10 mg total) by mouth every 6 (six) hours as needed (Nausea or vomiting). Patient not taking: Reported on 10/18/2019 10/05/19   Earlie Server, MD     Family History  Problem Relation Age of Onset  . Lung disease Father     Social History   Socioeconomic History  . Marital status: Married    Spouse name: Not on file  . Number of children: Not on file  . Years of education: Not on file  . Highest education level: Not on file  Occupational History  . Not on file  Tobacco Use  . Smoking status: Never Smoker  . Smokeless tobacco: Never Used  Substance and Sexual Activity  . Alcohol use: Not Currently    Alcohol/week: 0.0 standard drinks  . Drug use: Never  . Sexual activity: Not Currently  Other Topics Concern  . Not on file  Social History Narrative  . Not on file   Social Determinants of Health   Financial Resource Strain:   . Difficulty of Paying Living Expenses:   Food Insecurity:   . Worried About Charity fundraiser in the Last Year:   . [place] in the Last Year:  Transportation Needs:   . Film/video editor (Medical):   [name] Kitchen Lack of Transportation (Non-Medical):   Physical Activity:   . Days of Exercise per Week:   . Minutes of Exercise per Session:   Stress:   . Feeling of Stress :   Social Connections:   . Frequency of Communication with Friends and Family:   . Frequency of Social Gatherings with Friends and Family:   . Attends Religious Services:   . Active Member of Clubs or Organizations:   . Attends Archivist Meetings:   [name] Kitchen Marital Status:     ECOG Status: 0 - Asymptomatic  Review of Systems: A 12 point ROS discussed and pertinent  positives are indicated in the HPI above.  All other systems are negative.  Review of Systems  Constitutional: Negative.   Respiratory: Negative.   Cardiovascular: Negative.   Gastrointestinal: Negative.   Genitourinary: Negative.   Musculoskeletal: Negative.   Neurological: Negative.     Vital Signs: BP (!) 182/79   Pulse 72   Temp 98.4 F (36.9 C) (Oral)   Resp 20   Ht 5' 9" (1.753 m)   Wt 93.9 kg   SpO2 98%   BMI 30.57 kg/m   Physical Exam Vitals reviewed.  Constitutional:      General: He is not in acute distress.    Appearance: Normal appearance. He is not ill-appearing, toxic-appearing or diaphoretic.  Cardiovascular:     Rate and Rhythm: Normal rate and regular rhythm.     Heart sounds: Normal heart sounds. No murmur. No friction rub. No gallop.   Pulmonary:     Effort: Pulmonary effort is normal. No respiratory distress.     Breath sounds: Normal breath sounds. No stridor. No wheezing, rhonchi or rales.  Abdominal:     General: Bowel sounds are normal. There is no distension.     Palpations: Abdomen is soft. There is no mass.     Tenderness: There is no abdominal tenderness. There is no guarding or rebound.     Hernia: No hernia is present.  Skin:    General: Skin is warm and dry.  Neurological:     General: No focal deficit present.     Mental Status: He is alert and oriented to person, place, and time.     Imaging: CT ABDOMEN PELVIS W CONTRAST  Result Date: 10/13/2019 CLINICAL DATA:  74 year old male with concern for peritonitis and bowel perforation. Pneumoperitoneum seen on earlier radiograph. EXAM: CT ABDOMEN AND PELVIS WITH CONTRAST TECHNIQUE: Multidetector CT imaging of the abdomen and pelvis was performed using the standard protocol following bolus administration of intravenous contrast. CONTRAST:  138m OMNIPAQUE IOHEXOL 300 MG/ML  SOLN COMPARISON:  Abdominal radiograph dated 10/13/2019 and CT dated 09/06/2019 FINDINGS: Lower chest: Minimal bibasilar  dependent atelectasis/scarring or sequela of prior infectious process. This findings are similar to the CT of 09/06/2019. Advanced 3 vessel coronary vascular calcification. There is moderate pneumoperitoneum with the majority of the air in the upper abdomen beneath the diaphragm. Small perihepatic free fluid. Hepatobiliary: Probable background of fatty liver. No intrahepatic biliary ductal dilatation. Cholecystectomy. No retained calcified stone in the central CBD. Pancreas: Unremarkable. No pancreatic ductal dilatation or surrounding inflammatory changes. Spleen: Normal in size without focal abnormality. Adrenals/Urinary Tract: The adrenal glands are unremarkable. There is no hydronephrosis on either side. There is symmetric enhancement and excretion of contrast by both kidneys. There is a 9 cm left renal cyst. The visualized ureters and urinary bladder appear unremarkable.  Stomach/Bowel: There is sigmoid diverticulosis with muscular hypertrophy. Postsurgical changes of the sigmoid colon with anastomotic suture. Extraluminal air noted adjacent to the sigmoid suture concerning for sutural dehiscence. There is extensive sigmoid diverticulosis and diffuse colonic diverticula. No definite active inflammation. There is no bowel obstruction. The appendix is surgically absent. Vascular/Lymphatic: Advanced aortoiliac atherosclerotic disease. The IVC is unremarkable. No portal venous gas. There is no adenopathy. Reproductive: The prostate and seminal vesicles are grossly unremarkable. Other: There is a 5.2 x 4.1 cm loculated fluid collection in the left lower abdomen concerning for an infected collection or abscess, possibly sequela of prior sigmoid diverticulitis. Clinical correlation is recommended. Musculoskeletal: Degenerative changes of the spine. No acute osseous pathology. IMPRESSION: 1. Moderate pneumoperitoneum with the majority of the air in the upper abdomen beneath the diaphragm. A pocket of air noted adjacent  to the sigmoid anastomotic suture suggests sutural dehiscence. 2. Extensive colonic diverticulosis. No bowel obstruction. 3. A 5.2 x 4.1 cm loculated fluid collection in the left lower abdomen concerning for an infected collection or abscess, possibly sequela of prior sigmoid diverticulitis. Clinical correlation is recommended. 4. Aortic Atherosclerosis (ICD10-I70.0). These results were called by telephone at the time of interpretation on 10/13/2019 at 3:37 pm to provider [facility] CINTRON-DIAZ , who verbally acknowledged these results. Electronically Signed   By: Anner Crete M.D.   On: 10/13/2019 15:50   DG Chest Port 1 View  Addendum Date: 10/13/2019   ADDENDUM REPORT: 10/13/2019 10:57 ADDENDUM: Free air versus is bowel related artifact beneath the LEFT hemidiaphragm was discussed with the referring physician is outlined below. These results were called by telephone at the time of interpretation on 10/13/2019 at 10:57 am to provider [facility] CINTRON-DIAZ , who verbally acknowledged these results. Electronically Signed   By: Zetta Bills M.D.   On: 10/13/2019 10:57   Result Date: 10/13/2019 CLINICAL DATA:  Postop Port-A-Cath placement EXAM: PORTABLE CHEST 1 VIEW COMPARISON:  Chest abdomen and pelvis CT 09/06/2019 FINDINGS: RIGHT IJ Port-A-Cath terminates in the mid to distal superior vena cava. Cardiomediastinal contours are normal. Post median sternotomy and CABG. Lungs are clear.  No sign of pneumothorax. Lucency beneath the LEFT hemidiaphragm is not clearly within bowel loops and is seen extending laterally. No lucency beneath RIGHT hemidiaphragm. IMPRESSION: RIGHT IJ Port-A-Cath terminates in the mid to distal superior vena cava. No pneumothorax. Question of free air beneath the LEFT hemidiaphragm, potentially contained within loops of bowel though there are loops of bowel in this area neck can be visualized separate from the area of concern and extends more laterally than expected for bowel gas. Dedicated  upright abdominal radiograph may be helpful. A call is out to the referring provider to further discuss findings in the above case. Electronically Signed: By: Zetta Bills M.D. On: 10/13/2019 10:47   DG Abd 2 Views  Result Date: 10/13/2019 CLINICAL DATA:  Question of free air on chest x-ray. EXAM: ABDOMEN - 2 VIEW COMPARISON:  Chest x-ray same date FINDINGS: There is lucency beneath RIGHT and LEFT hemidiaphragm. Median sternotomy changes are demonstrated. Opacity of gas in the abdomen within loops of bowel. Post cholecystectomy. Spinal degenerative changes as before. IMPRESSION: Lucency beneath the RIGHT and LEFT hemidiaphragm. This could be due to free air, the patient did undergo a surgical procedure greater than 2 weeks ago. Further evaluation with CT is recommended. No sign of bowel obstruction. These results were called by telephone at the time of interpretation on 10/13/2019 at 12:50 pm to provider Hugo ,  who verbally acknowledged these results. Electronically Signed   By: Zetta Bills M.D.   On: 10/13/2019 12:53   DG BE (COLON)W SINGLE CM (SOL OR THIN BA)  Result Date: 10/14/2019 CLINICAL DATA:  Large bowel anastomotic leak. EXAM: WATER SOLUBLE CONTRAST ENEMA TECHNIQUE: Standard water soluble contrast enema performed with multiple images obtained. FLUOROSCOPY TIME:  Fluoroscopy Time:  2 minutes 24 seconds Radiation Exposure Index (if provided by the fluoroscopic device): 125.6 mGy COMPARISON:  CT 10/13/2019. FINDINGS: Narrowing of the rectosigmoid colon the level of the anastomosis is noted. What appears to be a prominent contained leak noted at this level. Remainder of the colon is widely patent with scattered diverticuli. No mass lesion or obstructing lesion otherwise noted. Terminal ileum not identified. IMPRESSION: Narrowing of the recto sigmoid at the level of the anastomosis. What appears to be a prominent contained leak is noted at this level. These results will be called to the  ordering clinician or representative by the Radiologist Assistant, and communication documented in the PACS or [employer]. Electronically Signed   By: Marcello Moores  Register   On: 10/14/2019 11:53   DG C-Arm 1-60 Min-No Report  Result Date: 10/13/2019 Fluoroscopy was utilized by the requesting physician.  No radiographic interpretation.    Labs:  CBC: Recent Labs    09/06/19 1200 09/21/19 0626 10/18/19 1100  WBC 8.5 12.0* 10.5  HGB 14.9 12.6* 13.5  HCT 42.4 36.0* 38.9*  PLT 194 183 275    COAGS: Recent Labs    10/18/19 1100  INR 1.1    BMP: Recent Labs    09/06/19 1200 09/21/19 0626  NA 139 139  K 4.1 3.9  CL 103 108  CO2 26 24  GLUCOSE 121* 116*  BUN 12 12  CALCIUM 9.2 8.3*  CREATININE 1.10 1.17  GFRNONAA >60 >60  GFRAA >60 >60    LIVER FUNCTION TESTS: Recent Labs    09/06/19 1200  BILITOT 0.9  AST 23  ALT 26  ALKPHOS 73  PROT 8.2*  ALBUMIN 4.4    TUMOR MARKERS: No results for input(s): AFPTM, CEA, CA199, CHROMGRNA in the last 8760 hours.  Assessment and Plan:  For CT guided catheter drainage of LLQ fluid collection at confirmed site of contained anastomotic leak should percutaneous window be available for drain placement. Risks and benefits discussed with the patient including bleeding, infection, damage to adjacent structures, bowel perforation/fistula connection, and sepsis. All of the patient's questions were answered, patient is agreeable to proceed. Consent signed and in chart.  Thank you for this interesting consult.  I greatly enjoyed meeting DURREL MCNEE and look forward to participating in their care.  A copy of this report was sent to the requesting provider on this date.  Electronically Signed: Azzie Roup, MD 10/18/2019, 11:45 AM     I spent a total of 30 Minutes in face to face in clinical consultation, greater than 50% of which was counseling/coordinating care for drainage of pelvic fluid collection.

## 2019-10-18 NOTE — Progress Notes (Signed)
Patient post Abscess drain placment per Dr Kathlene Cote, tolerated well. Vitals stable throughout, awake/alert and oriented post procedure. Taking po's without difficutly. Wife given post procedure update. Received Versed 24m along with Fentanyl 1011m IV for procedure. Denies complaints at this time. Received Ancef 2gm IV for procedure. Report given to RoFransico Michaeln post procedure in specials with questions answered. Discharge and drain instructions given to wife and patient post procedure.

## 2019-10-20 NOTE — Patient Instructions (Signed)
Oxaliplatin Injection What is this medicine? OXALIPLATIN (ox AL i PLA tin) is a chemotherapy drug. It targets fast dividing cells, like cancer cells, and causes these cells to die. This medicine is used to treat cancers of the colon and rectum, and many other cancers. This medicine may be used for other purposes; ask your health care provider or pharmacist if you have questions. COMMON BRAND NAME(S): Eloxatin What should I tell my health care provider before I take this medicine? They need to know if you have any of these conditions:  heart disease  history of irregular heartbeat  liver disease  low blood counts, like white cells, platelets, or red blood cells  lung or breathing disease, like asthma  take medicines that treat or prevent blood clots  tingling of the fingers or toes, or other nerve disorder  an unusual or allergic reaction to oxaliplatin, other chemotherapy, other medicines, foods, dyes, or preservatives  pregnant or trying to get pregnant  breast-feeding How should I use this medicine? This drug is given as an infusion into a vein. It is administered in a hospital or clinic by a specially trained health care professional. Talk to your pediatrician regarding the use of this medicine in children. Special care may be needed. Overdosage: If you think you have taken too much of this medicine contact a poison control center or emergency room at once. NOTE: This medicine is only for you. Do not share this medicine with others. What if I miss a dose? It is important not to miss a dose. Call your doctor or health care professional if you are unable to keep an appointment. What may interact with this medicine? Do not take this medicine with any of the following medications:  cisapride  dronedarone  pimozide  thioridazine This medicine may also interact with the following medications:  aspirin and aspirin-like medicines  certain medicines that treat or prevent  blood clots like warfarin, apixaban, dabigatran, and rivaroxaban  cisplatin  cyclosporine  diuretics  medicines for infection like acyclovir, adefovir, amphotericin B, bacitracin, cidofovir, foscarnet, ganciclovir, gentamicin, pentamidine, vancomycin  NSAIDs, medicines for pain and inflammation, like ibuprofen or naproxen  other medicines that prolong the QT interval (an abnormal heart rhythm)  pamidronate  zoledronic acid This list may not describe all possible interactions. Give your health care provider a list of all the medicines, herbs, non-prescription drugs, or dietary supplements you use. Also tell them if you smoke, drink alcohol, or use illegal drugs. Some items may interact with your medicine. What should I watch for while using this medicine? Your condition will be monitored carefully while you are receiving this medicine. You may need blood work done while you are taking this medicine. This medicine may make you feel generally unwell. This is not uncommon as chemotherapy can affect healthy cells as well as cancer cells. Report any side effects. Continue your course of treatment even though you feel ill unless your healthcare professional tells you to stop. This medicine can make you more sensitive to cold. Do not drink cold drinks or use ice. Cover exposed skin before coming in contact with cold temperatures or cold objects. When out in cold weather wear warm clothing and cover your mouth and nose to warm the air that goes into your lungs. Tell your doctor if you get sensitive to the cold. Do not become pregnant while taking this medicine or for 9 months after stopping it. Women should inform their health care professional if they wish to become  pregnant or think they might be pregnant. Men should not father a child while taking this medicine and for 6 months after stopping it. There is potential for serious side effects to an unborn child. Talk to your health care professional  for more information. Do not breast-feed a child while taking this medicine or for 3 months after stopping it. This medicine has caused ovarian failure in some women. This medicine may make it more difficult to get pregnant. Talk to your health care professional if you are concerned about your fertility. This medicine has caused decreased sperm counts in some men. This may make it more difficult to father a child. Talk to your health care professional if you are concerned about your fertility. This medicine may increase your risk of getting an infection. Call your health care professional for advice if you get a fever, chills, or sore throat, or other symptoms of a cold or flu. Do not treat yourself. Try to avoid being around people who are sick. Avoid taking medicines that contain aspirin, acetaminophen, ibuprofen, naproxen, or ketoprofen unless instructed by your health care professional. These medicines may hide a fever. Be careful brushing or flossing your teeth or using a toothpick because you may get an infection or bleed more easily. If you have any dental work done, tell your dentist you are receiving this medicine. What side effects may I notice from receiving this medicine? Side effects that you should report to your doctor or health care professional as soon as possible:  allergic reactions like skin rash, itching or hives, swelling of the face, lips, or tongue  breathing problems  cough  low blood counts - this medicine may decrease the number of white blood cells, red blood cells, and platelets. You may be at increased risk for infections and bleeding  nausea, vomiting  pain, redness, or irritation at site where injected  pain, tingling, numbness in the hands or feet  signs and symptoms of bleeding such as bloody or black, tarry stools; red or dark brown urine; spitting up blood or brown material that looks like coffee grounds; red spots on the skin; unusual bruising or bleeding  from the eyes, gums, or nose  signs and symptoms of a dangerous change in heartbeat or heart rhythm like chest pain; dizziness; fast, irregular heartbeat; palpitations; feeling faint or lightheaded; falls  signs and symptoms of infection like fever; chills; cough; sore throat; pain or trouble passing urine  signs and symptoms of liver injury like dark yellow or brown urine; general ill feeling or flu-like symptoms; light-colored stools; loss of appetite; nausea; right upper belly pain; unusually weak or tired; yellowing of the eyes or skin  signs and symptoms of low red blood cells or anemia such as unusually weak or tired; feeling faint or lightheaded; falls  signs and symptoms of muscle injury like dark urine; trouble passing urine or change in the amount of urine; unusually weak or tired; muscle pain; back pain Side effects that usually do not require medical attention (report to your doctor or health care professional if they continue or are bothersome):  changes in taste  diarrhea  gas  hair loss  loss of appetite  mouth sores This list may not describe all possible side effects. Call your doctor for medical advice about side effects. You may report side effects to FDA at 1-800-FDA-1088. Where should I keep my medicine? This drug is given in a hospital or clinic and will not be stored at home. NOTE:  This sheet is a summary. It may not cover all possible information. If you have questions about this medicine, talk to your doctor, pharmacist, or health care provider.  2020 [employer] Standard (2018-09-16 12:20:35) Fluorouracil, 5-FU injection What is this medicine? FLUOROURACIL, 5-FU (flure oh YOOR a sil) is a chemotherapy drug. It slows the growth of cancer cells. This medicine is used to treat many types of cancer like breast cancer, colon or rectal cancer, pancreatic cancer, and stomach cancer. This medicine may be used for other purposes; ask your health care provider or  pharmacist if you have questions. COMMON BRAND NAME(S): Adrucil What should I tell my health care provider before I take this medicine? They need to know if you have any of these conditions:  blood disorders  dihydropyrimidine dehydrogenase (DPD) deficiency  infection (especially a virus infection such as chickenpox, cold sores, or herpes)  kidney disease  liver disease  malnourished, poor nutrition  recent or ongoing radiation therapy  an unusual or allergic reaction to fluorouracil, other chemotherapy, other medicines, foods, dyes, or preservatives  pregnant or trying to get pregnant  breast-feeding How should I use this medicine? This drug is given as an infusion or injection into a vein. It is administered in a hospital or clinic by a specially trained health care professional. Talk to your pediatrician regarding the use of this medicine in children. Special care may be needed. Overdosage: If you think you have taken too much of this medicine contact a poison control center or emergency room at once. NOTE: This medicine is only for you. Do not share this medicine with others. What if I miss a dose? It is important not to miss your dose. Call your doctor or health care professional if you are unable to keep an appointment. What may interact with this medicine?  allopurinol  cimetidine  dapsone  digoxin  hydroxyurea  leucovorin  levamisole  medicines for seizures like ethotoin, fosphenytoin, phenytoin  medicines to increase blood counts like filgrastim, pegfilgrastim, sargramostim  medicines that treat or prevent blood clots like warfarin, enoxaparin, and dalteparin  methotrexate  metronidazole  pyrimethamine  some other chemotherapy drugs like busulfan, cisplatin, estramustine, vinblastine  trimethoprim  trimetrexate  vaccines Talk to your doctor or health care professional before taking any of these  medicines:  acetaminophen  aspirin  ibuprofen  ketoprofen  naproxen This list may not describe all possible interactions. Give your health care provider a list of all the medicines, herbs, non-prescription drugs, or dietary supplements you use. Also tell them if you smoke, drink alcohol, or use illegal drugs. Some items may interact with your medicine. What should I watch for while using this medicine? Visit your doctor for checks on your progress. This drug may make you feel generally unwell. This is not uncommon, as chemotherapy can affect healthy cells as well as cancer cells. Report any side effects. Continue your course of treatment even though you feel ill unless your doctor tells you to stop. In some cases, you may be given additional medicines to help with side effects. Follow all directions for their use. Call your doctor or health care professional for advice if you get a fever, chills or sore throat, or other symptoms of a cold or flu. Do not treat yourself. This drug decreases your body's ability to fight infections. Try to avoid being around people who are sick. This medicine may increase your risk to bruise or bleed. Call your doctor or health care professional if you notice any  unusual bleeding. Be careful brushing and flossing your teeth or using a toothpick because you may get an infection or bleed more easily. If you have any dental work done, tell your dentist you are receiving this medicine. Avoid taking products that contain aspirin, acetaminophen, ibuprofen, naproxen, or ketoprofen unless instructed by your doctor. These medicines may hide a fever. Do not become pregnant while taking this medicine. Women should inform their doctor if they wish to become pregnant or think they might be pregnant. There is a potential for serious side effects to an unborn child. Talk to your health care professional or pharmacist for more information. Do not breast-feed an infant while taking  this medicine. Men should inform their doctor if they wish to father a child. This medicine may lower sperm counts. Do not treat diarrhea with over the counter products. Contact your doctor if you have diarrhea that lasts more than 2 days or if it is severe and watery. This medicine can make you more sensitive to the sun. Keep out of the sun. If you cannot avoid being in the sun, wear protective clothing and use sunscreen. Do not use sun lamps or tanning beds/booths. What side effects may I notice from receiving this medicine? Side effects that you should report to your doctor or health care professional as soon as possible:  allergic reactions like skin rash, itching or hives, swelling of the face, lips, or tongue  low blood counts - this medicine may decrease the number of white blood cells, red blood cells and platelets. You may be at increased risk for infections and bleeding.  signs of infection - fever or chills, cough, sore throat, pain or difficulty passing urine  signs of decreased platelets or bleeding - bruising, pinpoint red spots on the skin, black, tarry stools, blood in the urine  signs of decreased red blood cells - unusually weak or tired, fainting spells, lightheadedness  breathing problems  changes in vision  chest pain  mouth sores  nausea and vomiting  pain, swelling, redness at site where injected  pain, tingling, numbness in the hands or feet  redness, swelling, or sores on hands or feet  stomach pain  unusual bleeding Side effects that usually do not require medical attention (report to your doctor or health care professional if they continue or are bothersome):  changes in finger or toe nails  diarrhea  dry or itchy skin  hair loss  headache  loss of appetite  sensitivity of eyes to the light  stomach upset  unusually teary eyes This list may not describe all possible side effects. Call your doctor for medical advice about side effects.  You may report side effects to FDA at 1-800-FDA-1088. Where should I keep my medicine? This drug is given in a hospital or clinic and will not be stored at home. NOTE: This sheet is a summary. It may not cover all possible information. If you have questions about this medicine, talk to your doctor, pharmacist, or health care provider.  2020 [employer] Standard (2007-09-02 13:53:16) Leucovorin injection What is this medicine? LEUCOVORIN (loo koe VOR in) is used to prevent or treat the harmful effects of some medicines. This medicine is used to treat anemia caused by a low amount of folic acid in the body. It is also used with 5-fluorouracil (5-FU) to treat colon cancer. This medicine may be used for other purposes; ask your health care provider or pharmacist if you have questions. What should I tell my health care  provider before I take this medicine? They need to know if you have any of these conditions:  anemia from low levels of vitamin B-12 in the blood  an unusual or allergic reaction to leucovorin, folic acid, other medicines, foods, dyes, or preservatives  pregnant or trying to get pregnant  breast-feeding How should I use this medicine? This medicine is for injection into a muscle or into a vein. It is given by a health care professional in a hospital or clinic setting. Talk to your pediatrician regarding the use of this medicine in children. Special care may be needed. Overdosage: If you think you have taken too much of this medicine contact a poison control center or emergency room at once. NOTE: This medicine is only for you. Do not share this medicine with others. What if I miss a dose? This does not apply. What may interact with this medicine?  capecitabine  fluorouracil  phenobarbital  phenytoin  primidone  trimethoprim-sulfamethoxazole This list may not describe all possible interactions. Give your health care provider a list of all the medicines, herbs,  non-prescription drugs, or dietary supplements you use. Also tell them if you smoke, drink alcohol, or use illegal drugs. Some items may interact with your medicine. What should I watch for while using this medicine? Your condition will be monitored carefully while you are receiving this medicine. This medicine may increase the side effects of 5-fluorouracil, 5-FU. Tell your doctor or health care professional if you have diarrhea or mouth sores that do not get better or that get worse. What side effects may I notice from receiving this medicine? Side effects that you should report to your doctor or health care professional as soon as possible:  allergic reactions like skin rash, itching or hives, swelling of the face, lips, or tongue  breathing problems  fever, infection  mouth sores  unusual bleeding or bruising  unusually weak or tired Side effects that usually do not require medical attention (report to your doctor or health care professional if they continue or are bothersome):  constipation or diarrhea  loss of appetite  nausea, vomiting This list may not describe all possible side effects. Call your doctor for medical advice about side effects. You may report side effects to FDA at 1-800-FDA-1088. Where should I keep my medicine? This drug is given in a hospital or clinic and will not be stored at home. NOTE: This sheet is a summary. It may not cover all possible information. If you have questions about this medicine, talk to your doctor, pharmacist, or health care provider.  2020 [employer] Standard (2007-11-03 16:50:29)

## 2019-10-21 ENCOUNTER — Inpatient Hospital Stay: Payer: Medicare Other | Attending: Oncology

## 2019-10-21 ENCOUNTER — Other Ambulatory Visit: Payer: Self-pay

## 2019-10-21 ENCOUNTER — Inpatient Hospital Stay (HOSPITAL_BASED_OUTPATIENT_CLINIC_OR_DEPARTMENT_OTHER): Payer: Medicare Other | Admitting: Oncology

## 2019-10-21 DIAGNOSIS — D649 Anemia, unspecified: Secondary | ICD-10-CM | POA: Insufficient documentation

## 2019-10-21 DIAGNOSIS — Z79899 Other long term (current) drug therapy: Secondary | ICD-10-CM | POA: Insufficient documentation

## 2019-10-21 DIAGNOSIS — C20 Malignant neoplasm of rectum: Secondary | ICD-10-CM | POA: Diagnosis not present

## 2019-10-21 DIAGNOSIS — Z85828 Personal history of other malignant neoplasm of skin: Secondary | ICD-10-CM | POA: Insufficient documentation

## 2019-10-21 DIAGNOSIS — R630 Anorexia: Secondary | ICD-10-CM | POA: Insufficient documentation

## 2019-10-21 DIAGNOSIS — R634 Abnormal weight loss: Secondary | ICD-10-CM | POA: Insufficient documentation

## 2019-10-21 DIAGNOSIS — K5900 Constipation, unspecified: Secondary | ICD-10-CM | POA: Insufficient documentation

## 2019-10-21 DIAGNOSIS — R7303 Prediabetes: Secondary | ICD-10-CM | POA: Insufficient documentation

## 2019-10-21 DIAGNOSIS — N281 Cyst of kidney, acquired: Secondary | ICD-10-CM | POA: Insufficient documentation

## 2019-10-21 DIAGNOSIS — Z5111 Encounter for antineoplastic chemotherapy: Secondary | ICD-10-CM | POA: Insufficient documentation

## 2019-10-21 DIAGNOSIS — J984 Other disorders of lung: Secondary | ICD-10-CM | POA: Insufficient documentation

## 2019-10-22 NOTE — Progress Notes (Signed)
[facility]  [phone] Fax:([phone]  Patient Care Team: Albina Billet, MD as PCP - General (Internal Medicine) Clent Jacks, RN as Oncology Nurse Navigator Earlie Server, MD as Consulting Physician (Oncology)   Name of the patient: Garrett Peterson  [id number]  04-Aug-1945   Date of visit: 10/22/19  Diagnosis-rectal carcinoma  Chief complaint/Reason for visit- Initial Meeting for [facility], preparing for starting chemotherapy  Heme/Onc history:  Oncology History  Rectal cancer ([place])  09/28/2019 Initial Diagnosis   Rectal cancer ([place])   10/25/2019 -  Chemotherapy   The patient had palonosetron (ALOXI) injection 0.25 mg, 0.25 mg, Intravenous,  Once, 0 of 8 cycles leucovorin 892 mg in dextrose 5 % 250 mL infusion, 400 mg/m2, Intravenous,  Once, 0 of 8 cycles oxaliplatin (ELOXATIN) 190 mg in dextrose 5 % 500 mL chemo infusion, 85 mg/m2, Intravenous,  Once, 0 of 8 cycles fluorouracil (ADRUCIL) chemo injection 900 mg, 400 mg/m2, Intravenous,  Once, 0 of 8 cycles fluorouracil (ADRUCIL) 5,350 mg in sodium chloride 0.9 % 143 mL chemo infusion, 2,400 mg/m2, Intravenous, 1 Day/Dose, 0 of 8 cycles  for chemotherapy treatment.      Interval history-Garrett Peterson is a 74 year old male who presents to chemo care clinic today for initial meeting in preparation for starting chemotherapy. I introduced the chemo care clinic and we discussed that the role of the clinic is to assist those who are at an increased risk of emergency room visits and/or complications during the course of chemotherapy treatment. We discussed that the increased risk takes into account factors such as age, performance status, and co-morbidities. We also discussed that for some, this might include barriers to care such as not having a primary care provider, lack of insurance/transportation, or not being able to afford medications. We discussed that the goal  of the program is to help prevent unplanned ER visits and help reduce complications during chemotherapy. We do this by discussing specific risk factors to each individual and identifying ways that we can help improve these risk factors and reduce barriers to care.   ECOG FS:1 - Symptomatic but completely ambulatory  Review of systems- Review of Systems  Constitutional: Negative.  Negative for chills, fever, malaise/fatigue and weight loss.  HENT: Negative for congestion, ear pain and tinnitus.   Eyes: Negative.  Negative for blurred vision and double vision.  Respiratory: Negative.  Negative for cough, sputum production and shortness of breath.   Cardiovascular: Negative.  Negative for chest pain, palpitations and leg swelling.  Gastrointestinal: Negative.  Negative for abdominal pain, constipation, diarrhea, nausea and vomiting.  Genitourinary: Negative for dysuria, frequency and urgency.  Musculoskeletal: Negative for back pain and falls.  Skin: Negative.  Negative for rash.  Neurological: Negative.  Negative for weakness and headaches.  Endo/Heme/Allergies: Negative.  Does not bruise/bleed easily.  Psychiatric/Behavioral: Negative.  Negative for depression. The patient is not nervous/anxious and does not have insomnia.      Current treatment-concurrent FOLFOX and radiation scheduled to begin on 11/10/2019  No Known Allergies  Past Medical History:  Diagnosis Date  . Arthritis   . Basal cell carcinoma   . CAD (coronary artery disease)   . Cancer ([place]) 2021   rectal  . Hyperlipidemia   . Pre-diabetes     Past Surgical History:  Procedure Laterality Date  . APPENDECTOMY    . CARDIAC CATHETERIZATION    . COLONOSCOPY    . CORONARY ARTERY BYPASS  GRAFT    . open heart surgery    . PORTACATH PLACEMENT Right 10/13/2019   Procedure: INSERTION PORT-A-CATH;  Surgeon: Herbert Pun, MD;  Location: [facility] ORS;  Service: General;  Laterality: Right;    Social History    Socioeconomic History  . Marital status: Married    Spouse name: Not on file  . Number of children: Not on file  . Years of education: Not on file  . Highest education level: Not on file  Occupational History  . Not on file  Tobacco Use  . Smoking status: Never Smoker  . Smokeless tobacco: Never Used  Vaping Use  . Vaping Use: Never used  Substance and Sexual Activity  . Alcohol use: Not Currently    Alcohol/week: 0.0 standard drinks  . Drug use: Never  . Sexual activity: Not Currently  Other Topics Concern  . Not on file  Social History Narrative  . Not on file   Social Determinants of Health   Financial Resource Strain:   . Difficulty of Paying Living Expenses:   Food Insecurity:   . Worried About Charity fundraiser in the Last Year:   . Arboriculturist in the Last Year:   Transportation Needs:   . Film/video editor (Medical):   [name] Kitchen Lack of Transportation (Non-Medical):   Physical Activity:   . Days of Exercise per Week:   . Minutes of Exercise per Session:   Stress:   . Feeling of Stress :   Social Connections:   . Frequency of Communication with Friends and Family:   . Frequency of Social Gatherings with Friends and Family:   . Attends Religious Services:   . Active Member of Clubs or Organizations:   . Attends Archivist Meetings:   [name] Kitchen Marital Status:   Intimate Partner Violence:   . Fear of Current or Ex-Partner:   . Emotionally Abused:   [name] Kitchen Physically Abused:   . Sexually Abused:     Family History  Problem Relation Age of Onset  . Lung disease Father      Current Outpatient Medications:  .  amoxicillin-clavulanate (AUGMENTIN) 875-125 MG tablet, Take 1 tablet by mouth 2 (two) times daily for 10 days., Disp: 20 tablet, Rfl: 0 .  atorvastatin (LIPITOR) 20 MG tablet, Take 20 mg by mouth every evening. , Disp: , Rfl:  .  lidocaine-prilocaine (EMLA) cream, Apply to affected area once, Disp: 30 g, Rfl: 3 .  naproxen sodium (ALEVE) 220 MG  tablet, Take 220 mg by mouth daily as needed (Pain). , Disp: , Rfl:  .  neomycin (MYCIFRADIN) 500 MG tablet, Take 1,000 mg by mouth 3 (three) times daily., Disp: , Rfl:  .  prochlorperazine (COMPAZINE) 10 MG tablet, Take 1 tablet (10 mg total) by mouth every 6 (six) hours as needed (Nausea or vomiting). (Patient not taking: Reported on 10/18/2019), Disp: 30 tablet, Rfl: 1 .  temazepam (RESTORIL) 30 MG capsule, Take 30 mg by mouth at bedtime. , Disp: , Rfl:   Physical exam: There were no vitals filed for this visit. Physical Exam Constitutional:      Appearance: Normal appearance.  HENT:     Head: Normocephalic and atraumatic.  Eyes:     Pupils: Pupils are equal, round, and reactive to light.  Cardiovascular:     Rate and Rhythm: Normal rate and regular rhythm.     Heart sounds: Normal heart sounds. No murmur heard.   Pulmonary:     Effort:  Pulmonary effort is normal.     Breath sounds: Normal breath sounds. No wheezing.  Abdominal:     General: Bowel sounds are normal. There is no distension.     Palpations: Abdomen is soft.     Tenderness: There is no abdominal tenderness.  Musculoskeletal:        General: Normal range of motion.     Cervical back: Normal range of motion.  Skin:    General: Skin is warm and dry.     Findings: No rash.  Neurological:     Mental Status: He is alert and oriented to person, place, and time.  Psychiatric:        Judgment: Judgment normal.      CMP Latest Ref Rng & Units 09/21/2019  Glucose 70 - 99 mg/dL 116(H)  BUN 8 - 23 mg/dL 12  Creatinine 0.61 - 1.24 mg/dL 1.17  Sodium 135 - 145 mmol/L 139  Potassium 3.5 - 5.1 mmol/L 3.9  Chloride 98 - 111 mmol/L 108  CO2 22 - 32 mmol/L 24  Calcium 8.9 - 10.3 mg/dL 8.3(L)  Total Protein 6.5 - 8.1 g/dL -  Total Bilirubin 0.3 - 1.2 mg/dL -  Alkaline Phos 38 - 126 U/L -  AST 15 - 41 U/L -  ALT 0 - 44 U/L -   CBC Latest Ref Rng & Units 10/18/2019  WBC 4.0 - 10.5 K/uL 10.5  Hemoglobin 13.0 - 17.0 g/dL 13.5   Hematocrit 39 - 52 % 38.9(L)  Platelets 150 - 400 K/uL 275    No images are attached to the encounter.  CT ABDOMEN PELVIS W CONTRAST  Result Date: 10/13/2019 CLINICAL DATA:  74 year old male with concern for peritonitis and bowel perforation. Pneumoperitoneum seen on earlier radiograph. EXAM: CT ABDOMEN AND PELVIS WITH CONTRAST TECHNIQUE: Multidetector CT imaging of the abdomen and pelvis was performed using the standard protocol following bolus administration of intravenous contrast. CONTRAST:  164m OMNIPAQUE IOHEXOL 300 MG/ML  SOLN COMPARISON:  Abdominal radiograph dated 10/13/2019 and CT dated 09/06/2019 FINDINGS: Lower chest: Minimal bibasilar dependent atelectasis/scarring or sequela of prior infectious process. This findings are similar to the CT of 09/06/2019. Advanced 3 vessel coronary vascular calcification. There is moderate pneumoperitoneum with the majority of the air in the upper abdomen beneath the diaphragm. Small perihepatic free fluid. Hepatobiliary: Probable background of fatty liver. No intrahepatic biliary ductal dilatation. Cholecystectomy. No retained calcified stone in the central CBD. Pancreas: Unremarkable. No pancreatic ductal dilatation or surrounding inflammatory changes. Spleen: Normal in size without focal abnormality. Adrenals/Urinary Tract: The adrenal glands are unremarkable. There is no hydronephrosis on either side. There is symmetric enhancement and excretion of contrast by both kidneys. There is a 9 cm left renal cyst. The visualized ureters and urinary bladder appear unremarkable. Stomach/Bowel: There is sigmoid diverticulosis with muscular hypertrophy. Postsurgical changes of the sigmoid colon with anastomotic suture. Extraluminal air noted adjacent to the sigmoid suture concerning for sutural dehiscence. There is extensive sigmoid diverticulosis and diffuse colonic diverticula. No definite active inflammation. There is no bowel obstruction. The appendix is surgically  absent. Vascular/Lymphatic: Advanced aortoiliac atherosclerotic disease. The IVC is unremarkable. No portal venous gas. There is no adenopathy. Reproductive: The prostate and seminal vesicles are grossly unremarkable. Other: There is a 5.2 x 4.1 cm loculated fluid collection in the left lower abdomen concerning for an infected collection or abscess, possibly sequela of prior sigmoid diverticulitis. Clinical correlation is recommended. Musculoskeletal: Degenerative changes of the spine. No acute osseous pathology. IMPRESSION: 1. Moderate  pneumoperitoneum with the majority of the air in the upper abdomen beneath the diaphragm. A pocket of air noted adjacent to the sigmoid anastomotic suture suggests sutural dehiscence. 2. Extensive colonic diverticulosis. No bowel obstruction. 3. A 5.2 x 4.1 cm loculated fluid collection in the left lower abdomen concerning for an infected collection or abscess, possibly sequela of prior sigmoid diverticulitis. Clinical correlation is recommended. 4. Aortic Atherosclerosis (ICD10-I70.0). These results were called by telephone at the time of interpretation on 10/13/2019 at 3:37 pm to provider [facility] CINTRON-DIAZ , who verbally acknowledged these results. Electronically Signed   By: Anner Crete M.D.   On: 10/13/2019 15:50   DG Chest Port 1 View  Addendum Date: 10/13/2019   ADDENDUM REPORT: 10/13/2019 10:57 ADDENDUM: Free air versus is bowel related artifact beneath the LEFT hemidiaphragm was discussed with the referring physician is outlined below. These results were called by telephone at the time of interpretation on 10/13/2019 at 10:57 am to provider [facility] CINTRON-DIAZ , who verbally acknowledged these results. Electronically Signed   By: Zetta Bills M.D.   On: 10/13/2019 10:57   Result Date: 10/13/2019 CLINICAL DATA:  Postop Port-A-Cath placement EXAM: PORTABLE CHEST 1 VIEW COMPARISON:  Chest abdomen and pelvis CT 09/06/2019 FINDINGS: RIGHT IJ Port-A-Cath terminates in  the mid to distal superior vena cava. Cardiomediastinal contours are normal. Post median sternotomy and CABG. Lungs are clear.  No sign of pneumothorax. Lucency beneath the LEFT hemidiaphragm is not clearly within bowel loops and is seen extending laterally. No lucency beneath RIGHT hemidiaphragm. IMPRESSION: RIGHT IJ Port-A-Cath terminates in the mid to distal superior vena cava. No pneumothorax. Question of free air beneath the LEFT hemidiaphragm, potentially contained within loops of bowel though there are loops of bowel in this area neck can be visualized separate from the area of concern and extends more laterally than expected for bowel gas. Dedicated upright abdominal radiograph may be helpful. A call is out to the referring provider to further discuss findings in the above case. Electronically Signed: By: Zetta Bills M.D. On: 10/13/2019 10:47   DG Abd 2 Views  Result Date: 10/13/2019 CLINICAL DATA:  Question of free air on chest x-ray. EXAM: ABDOMEN - 2 VIEW COMPARISON:  Chest x-ray same date FINDINGS: There is lucency beneath RIGHT and LEFT hemidiaphragm. Median sternotomy changes are demonstrated. Opacity of gas in the abdomen within loops of bowel. Post cholecystectomy. Spinal degenerative changes as before. IMPRESSION: Lucency beneath the RIGHT and LEFT hemidiaphragm. This could be due to free air, the patient did undergo a surgical procedure greater than 2 weeks ago. Further evaluation with CT is recommended. No sign of bowel obstruction. These results were called by telephone at the time of interpretation on 10/13/2019 at 12:50 pm to provider [facility] CINTRON-DIAZ , who verbally acknowledged these results. Electronically Signed   By: Zetta Bills M.D.   On: 10/13/2019 12:53   DG BE (COLON)W SINGLE CM (SOL OR THIN BA)  Result Date: 10/14/2019 CLINICAL DATA:  Large bowel anastomotic leak. EXAM: WATER SOLUBLE CONTRAST ENEMA TECHNIQUE: Standard water soluble contrast enema performed with multiple  images obtained. FLUOROSCOPY TIME:  Fluoroscopy Time:  2 minutes 24 seconds Radiation Exposure Index (if provided by the fluoroscopic device): 125.6 mGy COMPARISON:  CT 10/13/2019. FINDINGS: Narrowing of the rectosigmoid colon the level of the anastomosis is noted. What appears to be a prominent contained leak noted at this level. Remainder of the colon is widely patent with scattered diverticuli. No mass lesion or obstructing lesion otherwise  noted. Terminal ileum not identified. IMPRESSION: Narrowing of the recto sigmoid at the level of the anastomosis. What appears to be a prominent contained leak is noted at this level. These results will be called to the ordering clinician or representative by the Radiologist Assistant, and communication documented in the PACS or [employer]. Electronically Signed   By: Marcello Moores  Register   On: 10/14/2019 11:53   DG C-Arm 1-60 Min-No Report  Result Date: 10/13/2019 Fluoroscopy was utilized by the requesting physician.  No radiographic interpretation.   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 Contained anastomotic leak has been a covered by imaging with development a left lower quadrant pelvic fluid collection/abscess. The patient presents for drainage of the fluid collection. EXAM: CT GUIDED CATHETER DRAINAGE OF PERITONEAL ABSCESS ANESTHESIA/SEDATION: 2.0 mg IV Versed 100 mcg IV Fentanyl Total Moderate Sedation Time:  20 minutes The patient's level of consciousness and physiologic status were continuously monitored during the procedure by Radiology nursing. PROCEDURE: The procedure, risks, benefits, and alternatives were explained to the patient. Questions regarding the procedure were encouraged and answered. The patient understands and consents to the procedure. A time out was performed prior to initiating the procedure. The left lateral abdominal  wall was prepped with chlorhexidine in a sterile fashion, and a sterile drape was applied covering the operative field. A sterile gown and sterile gloves were used for the procedure. Local anesthesia was provided with 1% Lidocaine. CT was performed initially in a supine position followed by a slightly oblique position with the left side raised. From this position, an 18 gauge trocar needle was advanced from an anterolateral approach to the level of the left lower quadrant fluid collection. After confirming needle tip position, fluid was aspirated from the needle. A guidewire was advanced into the collection and the needle removed. The percutaneous tract was dilated and a 12 French percutaneous drainage catheter advanced over the guidewire. Catheter position was confirmed by CT. Additional fluid was aspirated from the catheter and a fluid sample sent for culture analysis. The drainage catheter was flushed with sterile saline and attached to a suction bulb. The drain was secured at the skin with a Prolene retention suture and StatLock device. COMPLICATIONS: None FINDINGS: Very little anterior window was present to the level of the left-sided upper pelvic peritoneal fluid collection which by CT today measures approximately 4.3 x 5.3 cm. In a slightly oblique position with the left side raise, there was a much improved anterolateral window allowing needle advancement followed by drain placement. There was return of thick, tan colored fluid. A sample was sent for culture analysis. After drain placement, there is good return of fluid with suction bulb drainage. IMPRESSION: CT-guided percutaneous catheter drainage of peritoneal abscess in the left upper pelvis with return of thick, tan fluid. A sample was sent for culture analysis. A 12 French drainage catheter was placed and attached to suction bulb drainage. Electronically Signed   By: Aletta Edouard M.D.   On: 10/18/2019 13:33     Assessment and plan- Patient is a  74 y.o. male who presents to [facility] for initial meeting in preparation for starting chemotherapy for the treatment of rectal carcinoma.   1. 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  Postop course uncomplicated but noted to  have free air by x-ray after port placement on 10/13/2019 which led to CT showing a 4 x 5 cm fluid collection in left lower quadrant and PE on 10/14/2019 demonstrating focal contained leak at colonic anastomosis.  He presented to IR on 10/18/2019 to have CT-guided drainage of fluid collection.  Plan is for him to receive FOLFOX and radiation on 11/10/2019.  2. Chemo Care Clinic/High Risk for ER/Hospitalization during chemotherapy- We discussed the role of the chemo care clinic and identified patient specific risk factors. I discussed that patient was identified as high risk primarily based on: Stage of disease and age  Patient has past medical history positive for: Past Medical History:  Diagnosis Date  . Arthritis   . Basal cell carcinoma   . CAD (coronary artery disease)   . Cancer ([place]) 2021   rectal  . Hyperlipidemia   . Pre-diabetes     Patient has past surgical history positive for: Past Surgical History:  Procedure Laterality Date  . APPENDECTOMY    . CARDIAC CATHETERIZATION    . COLONOSCOPY    . CORONARY ARTERY BYPASS GRAFT    . open heart surgery    . PORTACATH PLACEMENT Right 10/13/2019   Procedure: INSERTION PORT-A-CATH;  Surgeon: Herbert Pun, MD;  Location: [facility] ORS;  Service: General;  Laterality: Right;    Based on our high risk symptom management report; this patient has a high risk of ED utilization.  The percentage below indicates how "at risk "  this patient based on the factors in this table within one year.   General Risk Score: 4  Values used to calculate this score:   Points  Metrics       1        Age: 69      3        Hospital Admissions: 3      0        ED Visits: 0      0        Has Chronic Obstructive Pulmonary Disease: No      0        Has Diabetes: No      0        Has Congestive Heart Failure: No      0        Has liver disease: No      0        Has Depression: No      0        Current PCP: Albina Billet, MD      0        Has Medicaid: No    3. We discussed that social determinants of health may have significant impacts on health and outcomes for cancer patients.  Today we discussed specific social determinants of performance status, alcohol use, depression, financial needs, food insecurity, housing, interpersonal violence, social connections, stress, tobacco use, and transportation.    After lengthy discussion the following were identified as areas of need: None at this time.  Outpatient services: We discussed options including home based and outpatient services, DME and care program. We discusssed that patients who participate in regular physical activity report fewer negative impacts of cancer and treatments and report less fatigue.   Financial Concerns: We discussed that living with cancer can create tremendous financial burden.  We discussed options for assistance. I asked that if assistance is needed in affording medications or paying bills to please let us know so  that we can provide assistance. We discussed options for food including social services, [employer] ($50 every 2 weeks) and onsite food pantry.  We will also notify [facility] to see if cancer center can provide additional support.  Referral to Social work: Introduced Education officer, museum Elease Etienne and the services he can provide such as support with [employer], cell phone and gas vouchers.   Support groups: We discussed options for support groups at the cancer center. If interested, please notify nurse navigator to enroll. We discussed options for managing stress including healthy eating,  exercise as well as participating in no charge counseling services at the cancer center and support groups.  If these are of interest, patient can notify either myself or primary nursing team.We discussed options for management including medications and referral to quit Smart program  Transportation: We discussed options for transportation including acta, paratransit, bus routes, link transit, taxi/uber/lyft, and cancer center [place].  I have notified primary oncology team who will help assist with arranging Lucianne Lei transportation for appointments when/if needed. We also discussed options for transportation on short notice/acute visits.  Palliative care services: We have palliative care services available in the cancer center to discuss goals of care and advanced care planning.  Please let us know if you have any questions or would like to speak to our palliative nurse practitioner.  Symptom Management Clinic: We discussed our symptom management clinic which is available for acute concerns while receiving treatment such as nausea, vomiting or diarrhea.  We can be reached via telephone at [phone] or through my chart.  We are available for virtual or in person visits on the same day from 830 to 4 PM Monday through Friday. He denies needing specific assistance at this time and He will be followed by Dr. Tasia Catchings clinical team.  Plan: Discussed symptom management clinic. Discussed palliative care services. Discussed resources that are available here at the cancer center. Discussed medications and new prescriptions to begin treatment such as anti-nausea or steroids.   Disposition: RTC on 11/10/2019 to begin treatment.  Visit Diagnosis 1. Rectal cancer [facility])     Patient expressed understanding and was in agreement with this plan. He also understands that He can call clinic at any time with any questions, concerns, or complaints.   Greater than 50% was spent in counseling and coordination of care with this patient  including but not limited to discussion of the relevant topics above (See A&P) including, but not limited to diagnosis and management of acute and chronic medical conditions.   [place] [facility] [place]  CC: Dr. Tasia Catchings

## 2019-10-23 LAB — AEROBIC/ANAEROBIC CULTURE W GRAM STAIN (SURGICAL/DEEP WOUND): Special Requests: NORMAL

## 2019-10-25 ENCOUNTER — Other Ambulatory Visit (HOSPITAL_COMMUNITY): Payer: Self-pay | Admitting: General Surgery

## 2019-10-25 DIAGNOSIS — K651 Peritoneal abscess: Secondary | ICD-10-CM

## 2019-10-27 ENCOUNTER — Ambulatory Visit: Payer: Medicare Other | Admitting: Oncology

## 2019-10-27 ENCOUNTER — Telehealth: Payer: Self-pay

## 2019-10-27 ENCOUNTER — Other Ambulatory Visit: Payer: Medicare Other

## 2019-10-27 ENCOUNTER — Ambulatory Visit (HOSPITAL_COMMUNITY)
Admission: RE | Admit: 2019-10-27 | Discharge: 2019-10-27 | Disposition: A | Discharge status: Home / Self Care | Payer: Medicare Other | Source: Ambulatory Visit | Attending: General Surgery | Admitting: General Surgery

## 2019-10-27 ENCOUNTER — Ambulatory Visit: Payer: Medicare Other

## 2019-10-27 ENCOUNTER — Other Ambulatory Visit: Payer: Self-pay

## 2019-10-27 DIAGNOSIS — R7303 Prediabetes: Secondary | ICD-10-CM | POA: Insufficient documentation

## 2019-10-27 DIAGNOSIS — K651 Peritoneal abscess: Secondary | ICD-10-CM | POA: Insufficient documentation

## 2019-10-27 DIAGNOSIS — C19 Malignant neoplasm of rectosigmoid junction: Secondary | ICD-10-CM | POA: Insufficient documentation

## 2019-10-27 DIAGNOSIS — Z434 Encounter for attention to other artificial openings of digestive tract: Secondary | ICD-10-CM | POA: Insufficient documentation

## 2019-10-27 DIAGNOSIS — E785 Hyperlipidemia, unspecified: Secondary | ICD-10-CM | POA: Insufficient documentation

## 2019-10-27 DIAGNOSIS — I2510 Atherosclerotic heart disease of native coronary artery without angina pectoris: Secondary | ICD-10-CM | POA: Insufficient documentation

## 2019-10-27 DIAGNOSIS — Z79899 Other long term (current) drug therapy: Secondary | ICD-10-CM | POA: Insufficient documentation

## 2019-10-27 HISTORY — PX: IR SINUS/FIST TUBE CHK-NON GI: IMG673

## 2019-10-27 LAB — POCT I-STAT CREATININE: Creatinine, Ser: 0.9 mg/dL (ref 0.61–1.24)

## 2019-10-27 MED ORDER — IOHEXOL 300 MG/ML  SOLN
100.0000 mL | Freq: Once | INTRAMUSCULAR | Status: AC | PRN
  Administered 2019-10-27: 100 mL via INTRAVENOUS

## 2019-10-27 MED ORDER — IOHEXOL 300 MG/ML  SOLN
50.0000 mL | Freq: Once | INTRAMUSCULAR | Status: AC | PRN
  Administered 2019-10-27: 15 mL

## 2019-10-27 NOTE — Telephone Encounter (Signed)
Pt scheduled for tx on 6/30 & pump dc on 7/2. Garrett Peterson please move appts up to next week and notify pt. Thanks

## 2019-10-27 NOTE — Progress Notes (Signed)
Referring Physician(s): Cintron-Diaz,Edgardo  Chief Complaint: The patient is seen in follow up today s/p intra-abdominal fluid collection  History of present illness: Garrett Peterson is a 74 y.o. 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 Post op course uncomplicated but noted to have free air by xray after port placement on 10/13/19 which led to CT showing a roughly 4 x 5 cm fluid collection in LLQ and BE on 10/14/19 demonstrating focal contained leak at colonic anastomosis. He presented to [facility] for outpatient drain placement 10/18/19. He returns to [facility] IR today for evaluation of his drain.   [name] reports ongoing improvement since drain placement.  He denies fever, chills, abdominal pain, nausea, vomiting, shortness of breath, cough, difficulty urinating, and is having regular bowel movements.  He has been eating and drinking per his usual.  His cultures at the time of drain placement returned positive for Entercoccus and he has completed a course of penicillin. He denies any output from his drain.  Reports it has been "nothing at all since they placed it."  He is hopeful for drain removal today.   Past Medical History:  Diagnosis Date  . Arthritis   . Basal cell carcinoma   . CAD (coronary artery disease)   . Cancer ([place]) 2021   rectal  . Hyperlipidemia   . Pre-diabetes     Past Surgical History:  Procedure Laterality Date  . APPENDECTOMY    . CARDIAC CATHETERIZATION    . COLONOSCOPY    . CORONARY ARTERY BYPASS GRAFT    . open heart surgery    . PORTACATH PLACEMENT Right 10/13/2019   Procedure: INSERTION PORT-A-CATH;  Surgeon: Herbert Pun, MD;  Location: [facility] ORS;  Service: General;  Laterality: Right;    Allergies: Patient has no known allergies.  Medications: Prior to Admission medications   Medication Sig Start Date End Date Taking? Authorizing Provider    atorvastatin (LIPITOR) 20 MG tablet Take 20 mg by mouth every evening.     [provider]  lidocaine-prilocaine (EMLA) cream Apply to affected area once 10/05/19   Earlie Server, MD  naproxen sodium (ALEVE) 220 MG tablet Take 220 mg by mouth daily as needed (Pain).     [provider]  neomycin (MYCIFRADIN) 500 MG tablet Take 1,000 mg by mouth 3 (three) times daily.    [provider]  prochlorperazine (COMPAZINE) 10 MG tablet Take 1 tablet (10 mg total) by mouth every 6 (six) hours as needed (Nausea or vomiting). Patient not taking: Reported on 10/18/2019 10/05/19   Earlie Server, MD  temazepam (RESTORIL) 30 MG capsule Take 30 mg by mouth at bedtime.  06/09/14   [provider]     Family History  Problem Relation Age of Onset  . Lung disease Father     Social History   Socioeconomic History  . Marital status: Married    Spouse name: Not on file  . Number of children: Not on file  . Years of education: Not on file  . Highest education level: Not on file  Occupational History  . Not on file  Tobacco Use  . Smoking status: Never Smoker  . Smokeless tobacco: Never Used  Vaping Use  . Vaping Use: Never used  Substance and Sexual Activity  . Alcohol use: Not Currently    Alcohol/week: 0.0 standard drinks  . Drug use: Never  . Sexual activity: Not  Currently  Other Topics Concern  . Not on file  Social History Narrative  . Not on file   Social Determinants of Health   Financial Resource Strain:   . Difficulty of Paying Living Expenses:   Food Insecurity:   . Worried About Charity fundraiser in the Last Year:   . Arboriculturist in the Last Year:   Transportation Needs:   . Film/video editor (Medical):   [name] Kitchen Lack of Transportation (Non-Medical):   Physical Activity:   . Days of Exercise per Week:   . Minutes of Exercise per Session:   Stress:   . Feeling of Stress :   Social Connections:   . Frequency of Communication with Friends and  Family:   . Frequency of Social Gatherings with Friends and Family:   . Attends Religious Services:   . Active Member of Clubs or Organizations:   . Attends Archivist Meetings:   [name] Kitchen Marital Status:      Vital Signs: There were no vitals taken for this visit.  Physical Exam  NAD, alert Abdomen: LLQ drain in place. Insertion site c/d/i.  Small amount of serous fluid in bulb, lightly colored fluid in tubing.    Imaging: No results found.  Labs:  CBC: Recent Labs    09/06/19 1200 09/21/19 0626 10/18/19 1100  WBC 8.5 12.0* 10.5  HGB 14.9 12.6* 13.5  HCT 42.4 36.0* 38.9*  PLT 194 183 275    COAGS: Recent Labs    10/18/19 1100  INR 1.1    BMP: Recent Labs    09/06/19 1200 09/21/19 0626  NA 139 139  K 4.1 3.9  CL 103 108  CO2 26 24  GLUCOSE 121* 116*  BUN 12 12  CALCIUM 9.2 8.3*  CREATININE 1.10 1.17  GFRNONAA >60 >60  GFRAA >60 >60    LIVER FUNCTION TESTS: Recent Labs    09/06/19 1200  BILITOT 0.9  AST 23  ALT 26  ALKPHOS 73  PROT 8.2*  ALBUMIN 4.4    Assessment: Intra-abdominal fluid collection, anastomotic leak after low anterior resection 09/20/19 for rectosigmoid carcinoma s/p aspiration and drainage 10/18/19 by Dr. Kathlene Cote. Mr. Hobin presents to [facility] IR today for follow-up of his LLQ drain.  He states he has been feeling well since drain placement with continued improvement.  His cultures were positive for Enterococcus and he was placed on penicillin.  He has completed course of antibiotics without return of symptoms. He has been eating and drinking well.  He has plans to start chemotherapy after drain removed/abscess healed-- likely at the end of this month.   CT imaging and drain injection reviewed by Dr. Pascal Lux who notes improvement in fluid collection and recommends drain removal. Drain removed in IR without complication.  Dressing placed.  No further follow-up needed with IR at this time.   Signed: Docia Barrier,  PA 10/27/2019, 9:55 AM   Please refer to Dr. Pascal Lux attestation of this note for management and plan.

## 2019-10-27 NOTE — Telephone Encounter (Signed)
-----  Message from Earlie Server, MD sent at 10/27/2019  3:08 PM EDT ----- Please arrange him to have lab md folfox (new), next week.

## 2019-10-28 NOTE — Telephone Encounter (Signed)
Done.. Called pt and made him aware of his *NEW* 11/02/19 sched RTC date. Pt is aware

## 2019-11-02 ENCOUNTER — Inpatient Hospital Stay (HOSPITAL_BASED_OUTPATIENT_CLINIC_OR_DEPARTMENT_OTHER): Payer: Medicare Other | Admitting: Oncology

## 2019-11-02 ENCOUNTER — Inpatient Hospital Stay: Payer: Medicare Other

## 2019-11-02 ENCOUNTER — Other Ambulatory Visit: Payer: Self-pay

## 2019-11-02 ENCOUNTER — Encounter: Payer: Self-pay | Admitting: Oncology

## 2019-11-02 VITALS — BP 161/78 | HR 69 | Temp 96.3°F | Resp 18 | Wt 214.0 lb

## 2019-11-02 VITALS — BP 192/86 | HR 79 | Resp 18

## 2019-11-02 DIAGNOSIS — R630 Anorexia: Secondary | ICD-10-CM | POA: Diagnosis not present

## 2019-11-02 DIAGNOSIS — R030 Elevated blood-pressure reading, without diagnosis of hypertension: Secondary | ICD-10-CM | POA: Diagnosis not present

## 2019-11-02 DIAGNOSIS — Z85828 Personal history of other malignant neoplasm of skin: Secondary | ICD-10-CM | POA: Diagnosis not present

## 2019-11-02 DIAGNOSIS — N281 Cyst of kidney, acquired: Secondary | ICD-10-CM | POA: Diagnosis not present

## 2019-11-02 DIAGNOSIS — C20 Malignant neoplasm of rectum: Secondary | ICD-10-CM

## 2019-11-02 DIAGNOSIS — J984 Other disorders of lung: Secondary | ICD-10-CM | POA: Diagnosis not present

## 2019-11-02 DIAGNOSIS — D649 Anemia, unspecified: Secondary | ICD-10-CM | POA: Diagnosis not present

## 2019-11-02 DIAGNOSIS — R7303 Prediabetes: Secondary | ICD-10-CM | POA: Diagnosis not present

## 2019-11-02 DIAGNOSIS — Z95828 Presence of other vascular implants and grafts: Secondary | ICD-10-CM

## 2019-11-02 DIAGNOSIS — Z5111 Encounter for antineoplastic chemotherapy: Secondary | ICD-10-CM

## 2019-11-02 DIAGNOSIS — Z79899 Other long term (current) drug therapy: Secondary | ICD-10-CM | POA: Diagnosis not present

## 2019-11-02 DIAGNOSIS — R634 Abnormal weight loss: Secondary | ICD-10-CM | POA: Diagnosis not present

## 2019-11-02 DIAGNOSIS — K5900 Constipation, unspecified: Secondary | ICD-10-CM | POA: Diagnosis not present

## 2019-11-02 LAB — COMPREHENSIVE METABOLIC PANEL
ALT: 22 U/L (ref 0–44)
AST: 23 U/L (ref 15–41)
Albumin: 3.8 g/dL (ref 3.5–5.0)
Alkaline Phosphatase: 91 U/L (ref 38–126)
Anion gap: 8 (ref 5–15)
BUN: 11 mg/dL (ref 8–23)
CO2: 27 mmol/L (ref 22–32)
Calcium: 8.9 mg/dL (ref 8.9–10.3)
Chloride: 105 mmol/L (ref 98–111)
Creatinine, Ser: 0.83 mg/dL (ref 0.61–1.24)
GFR calc Af Amer: >60 mL/min (ref >60)
GFR calc non Af Amer: >60 mL/min (ref >60)
Glucose, Bld: 129 mg/dL — ABNORMAL HIGH (ref 70–99)
Potassium: 3.7 mmol/L (ref 3.5–5.1)
Sodium: 140 mmol/L (ref 135–145)
Total Bilirubin: 0.5 mg/dL (ref 0.3–1.2)
Total Protein: 7.5 g/dL (ref 6.5–8.1)

## 2019-11-02 LAB — CBC WITH DIFFERENTIAL/PLATELET
Abs Immature Granulocytes: 0.02 10*3/uL (ref 0.00–0.07)
Basophils Absolute: 0.1 10*3/uL (ref 0.0–0.1)
Basophils Relative: 1 %
Eosinophils Absolute: 0.6 10*3/uL — ABNORMAL HIGH (ref 0.0–0.5)
Eosinophils Relative: 8 %
HCT: 36.4 % — ABNORMAL LOW (ref 39.0–52.0)
Hemoglobin: 12.5 g/dL — ABNORMAL LOW (ref 13.0–17.0)
Immature Granulocytes: 0 %
Lymphocytes Relative: 32 %
Lymphs Abs: 2.2 10*3/uL (ref 0.7–4.0)
MCH: 32.1 pg (ref 26.0–34.0)
MCHC: 34.3 g/dL (ref 30.0–36.0)
MCV: 93.6 fL (ref 80.0–100.0)
Monocytes Absolute: 0.6 10*3/uL (ref 0.1–1.0)
Monocytes Relative: 9 %
Neutro Abs: 3.5 10*3/uL (ref 1.7–7.7)
Neutrophils Relative %: 50 %
Platelets: 179 10*3/uL (ref 150–400)
RBC: 3.89 MIL/uL — ABNORMAL LOW (ref 4.22–5.81)
RDW: 13.7 % (ref 11.5–15.5)
WBC: 7 10*3/uL (ref 4.0–10.5)
nRBC: 0 % (ref 0.0–0.2)

## 2019-11-02 MED ORDER — SODIUM CHLORIDE 0.9 % IV SOLN
10.0000 mg | Freq: Once | INTRAVENOUS | Status: AC
  Administered 2019-11-02: 10 mg via INTRAVENOUS
  Filled 2019-11-02: qty 10

## 2019-11-02 MED ORDER — FLUOROURACIL CHEMO INJECTION 2.5 GM/50ML
400.0000 mg/m2 | Freq: Once | INTRAVENOUS | Status: AC
  Administered 2019-11-02: 900 mg via INTRAVENOUS
  Filled 2019-11-02: qty 18

## 2019-11-02 MED ORDER — SODIUM CHLORIDE 0.9 % IV SOLN
2400.0000 mg/m2 | INTRAVENOUS | Status: DC
  Administered 2019-11-02: 5350 mg via INTRAVENOUS
  Filled 2019-11-02: qty 100

## 2019-11-02 MED ORDER — PALONOSETRON HCL INJECTION 0.25 MG/5ML
0.2500 mg | Freq: Once | INTRAVENOUS | Status: AC
  Administered 2019-11-02: 0.25 mg via INTRAVENOUS
  Filled 2019-11-02: qty 5

## 2019-11-02 MED ORDER — LEUCOVORIN CALCIUM INJECTION 350 MG
900.0000 mg | Freq: Once | INTRAVENOUS | Status: AC
  Administered 2019-11-02: 900 mg via INTRAVENOUS
  Filled 2019-11-02: qty 25

## 2019-11-02 MED ORDER — DEXTROSE 5 % IV SOLN
Freq: Once | INTRAVENOUS | Status: AC
  Filled 2019-11-02: qty 250

## 2019-11-02 MED ORDER — SODIUM CHLORIDE 0.9% FLUSH
10.0000 mL | INTRAVENOUS | Status: DC | PRN
  Administered 2019-11-02: 10 mL via INTRAVENOUS
  Filled 2019-11-02: qty 10

## 2019-11-02 MED ORDER — OXALIPLATIN CHEMO INJECTION 100 MG/20ML
83.0000 mg/m2 | Freq: Once | INTRAVENOUS | Status: AC
  Administered 2019-11-02: 185 mg via INTRAVENOUS
  Filled 2019-11-02: qty 37

## 2019-11-02 NOTE — Progress Notes (Signed)
.  Hematology/Oncology follow up  note [facility] Telephone:([phone] Fax:([phone]) [phone]   Patient Care Team: Albina Billet, MD as PCP - General (Internal Medicine) Clent Jacks, RN as Oncology Nurse Navigator Earlie Server, MD as Consulting Physician (Oncology)  REFERRING PROVIDER: Albina Billet, MD  CHIEF COMPLAINTS/REASON FOR VISIT:  Follow-up for colorectal cancer  HISTORY OF PRESENTING ILLNESS:   Garrett Peterson is a  74 y.o.  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 Alice Reichert for follow-up on history of colon polyps.  Patient also has reported recent history of change of bowel habits.  He has felt more constipated. No blood in the stool, abdominal pain, unintentional weight loss or fever or chills. Colonoscopy records is at [facility] clinic and is not available to me. 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  MMR is deferred to resection specimen.  # 09/20/2019, patient underwent robotic assisted laparoscopic low anterior resection. Pathology report was reviewed by me and discussed with patient and his wife. I also called pathology and discussed with Dr. Reuel Derby. The tumor is located 1.5 cm distal to the rectosigmoid junction.  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  I explained to patient the different final diagnosis of cancer of rectum rather than endoscopic impression of distal sigmoid colon cancer.  Tumor was not visualized on preop CT scan.  -pT3 pN1 M0, grade 2 adenocarcinoma. MMR status is intact  All margins were uninvolved by invasive carcinoma, high-grade dysplasia/intramucosal adenocarcinoma and low-grade dysplastic.  1 out of 8 regional lymph nodes were positive for malignancy. Patient has lymphovascular  invasion as well as perineural invasion. I discussed with patient that ideally his rectal cancer would be better treated with total neoadjuvant treatment prior to the surgery if we would know his final diagnosis will be an upper rectal cancer.  Preoperative treatments is associated with a more favorable long-term toxicity profile and fever local recurrence then postoperative therapy.  However overall survival appears to be similar.In this case, I would recommend chemotherapy and adjuvant chemo- radiation    INTERVAL HISTORY Garrett Peterson is a 74 y.o. male who has above history reviewed by me today presents for follow up visit for management of colorectal cancer. Problems and complaints are listed below: 10/13/2019 patient had CT abdomen pelvis with contrast for evaluation of concern of peritonitis and bowel perforation.  Pneumoperitoneum was seen on earlier radiograph.  There was moderate pneumoperitoneum, extensive colonic diverticulosis.  5.2 x 4.1 cm loculated fluid collection in the left lower abdomen concerning for an infected collection or abscess. Patient had a drain placed and finished a course of antibiotics.  Draining catheter was pulled.  Today patient reports feeling well.  He is anxious and nervous about upcoming chemotherapy.  He is accompanied by his wife. He denies any fever, chills, nausea, vomiting, abdominal pain. He has noticed that his blood pressure has been elevated since his recent surgery.  No headache, focal weakness.  He does not have diagnosed high blood pressure.  Review of Systems  Constitutional: Negative for appetite change, chills, fatigue, fever and unexpected weight change.  HENT:   Negative for hearing loss and voice change.   Eyes: Negative for eye problems and icterus.  Respiratory: Negative for chest tightness, cough and shortness of  breath.   Cardiovascular: Negative for chest pain and leg swelling.  Gastrointestinal: Negative for abdominal distention,  abdominal pain and blood in stool.  Endocrine: Negative for hot flashes.  Genitourinary: Negative for difficulty urinating, dysuria and frequency.   Musculoskeletal: Negative for arthralgias.  Skin: Negative for itching and rash.  Neurological: Negative for extremity weakness, light-headedness and numbness.  Hematological: Negative for adenopathy. Does not bruise/bleed easily.  Psychiatric/Behavioral: Negative for confusion.    MEDICAL HISTORY:  Past Medical History:  Diagnosis Date  . Arthritis   . Basal cell carcinoma   . CAD (coronary artery disease)   . Cancer ([place]) 2021   rectal  . Hyperlipidemia   . Pre-diabetes     SURGICAL HISTORY: Past Surgical History:  Procedure Laterality Date  . APPENDECTOMY    . CARDIAC CATHETERIZATION    . COLONOSCOPY    . CORONARY ARTERY BYPASS GRAFT    . IR SINUS/FIST TUBE CHK-NON GI  10/27/2019  . open heart surgery    . PORTACATH PLACEMENT Right 10/13/2019   Procedure: INSERTION PORT-A-CATH;  Surgeon: Herbert Pun, MD;  Location: [facility] ORS;  Service: General;  Laterality: Right;    SOCIAL HISTORY: Social History   Socioeconomic History  . Marital status: Married    Spouse name: Not on file  . Number of children: Not on file  . Years of education: Not on file  . Highest education level: Not on file  Occupational History  . Not on file  Tobacco Use  . Smoking status: Never Smoker  . Smokeless tobacco: Never Used  Vaping Use  . Vaping Use: Never used  Substance and Sexual Activity  . Alcohol use: Not Currently    Alcohol/week: 0.0 standard drinks  . Drug use: Never  . Sexual activity: Not Currently  Other Topics Concern  . Not on file  Social History Narrative  . Not on file   Social Determinants of Health   Financial Resource Strain:   . Difficulty of Paying Living Expenses:   Food Insecurity:   . Worried About Charity fundraiser in the Last Year:   . Arboriculturist in the Last Year:   Transportation Needs:     . Film/video editor (Medical):   [name] Kitchen Lack of Transportation (Non-Medical):   Physical Activity:   . Days of Exercise per Week:   . Minutes of Exercise per Session:   Stress:   . Feeling of Stress :   Social Connections:   . Frequency of Communication with Friends and Family:   . Frequency of Social Gatherings with Friends and Family:   . Attends Religious Services:   . Active Member of Clubs or Organizations:   . Attends Archivist Meetings:   [name] Kitchen Marital Status:   Intimate Partner Violence:   . Fear of Current or Ex-Partner:   . Emotionally Abused:   [name] Kitchen Physically Abused:   . Sexually Abused:     FAMILY HISTORY: Family History  Problem Relation Age of Onset  . Lung disease Father     ALLERGIES:  has No Known Allergies.  MEDICATIONS:  Current Outpatient Medications  Medication Sig Dispense Refill  . atorvastatin (LIPITOR) 20 MG tablet Take 20 mg by mouth every evening.     . lidocaine-prilocaine (EMLA) cream Apply to affected area once 30 g 3  . naproxen sodium (ALEVE) 220 MG tablet Take 220 mg by mouth daily as needed (Pain).     [name] Kitchen neomycin (MYCIFRADIN) 500 MG tablet  Take 1,000 mg by mouth 3 (three) times daily.    . prochlorperazine (COMPAZINE) 10 MG tablet Take 1 tablet (10 mg total) by mouth every 6 (six) hours as needed (Nausea or vomiting). (Patient not taking: Reported on 10/18/2019) 30 tablet 1  . temazepam (RESTORIL) 30 MG capsule Take 30 mg by mouth at bedtime.      No current facility-administered medications for this visit.   Facility-Administered Medications Ordered in Other Visits  Medication Dose Route Frequency Provider Last Rate Last Admin  . sodium chloride flush (NS) 0.9 % injection 10 mL  10 mL Intravenous PRN Earlie Server, MD   10 mL at 11/02/19 0811     PHYSICAL EXAMINATION: ECOG PERFORMANCE STATUS: 0 - Asymptomatic Vitals:   11/02/19 0838  BP: (!) 161/78  Pulse: 69  Resp: 18  Temp: (!) 96.3 F (35.7 C)   Filed Weights   11/02/19  0838  Weight: 214 lb (97.1 kg)    Physical Exam Constitutional:      General: He is not in acute distress. HENT:     Head: Normocephalic and atraumatic.  Eyes:     General: No scleral icterus. Cardiovascular:     Rate and Rhythm: Normal rate and regular rhythm.     Heart sounds: Normal heart sounds.  Pulmonary:     Effort: Pulmonary effort is normal. No respiratory distress.     Breath sounds: No wheezing.  Abdominal:     General: Bowel sounds are normal. There is no distension.     Palpations: Abdomen is soft.  Musculoskeletal:        General: No deformity. Normal range of motion.     Cervical back: Normal range of motion and neck supple.  Skin:    General: Skin is warm and dry.     Findings: No erythema or rash.     Comments: Right anterior chest wall + Mediport  Neurological:     Mental Status: He is alert and oriented to person, place, and time. Mental status is at baseline.     Cranial Nerves: No cranial nerve deficit.     Coordination: Coordination normal.  Psychiatric:        Mood and Affect: Mood normal.     LABORATORY DATA:  I have reviewed the data as listed Lab Results  Component Value Date   WBC 7.0 11/02/2019   HGB 12.5 (L) 11/02/2019   HCT 36.4 (L) 11/02/2019   MCV 93.6 11/02/2019   PLT 179 11/02/2019   Recent Labs    09/06/19 1200 09/21/19 0626 10/27/19 0821  NA 139 139  --   K 4.1 3.9  --   CL 103 108  --   CO2 26 24  --   GLUCOSE 121* 116*  --   BUN 12 12  --   CREATININE 1.10 1.17 0.90  CALCIUM 9.2 8.3*  --   GFRNONAA >60 >60  --   GFRAA >60 >60  --   PROT 8.2*  --   --   ALBUMIN 4.4  --   --   AST 23  --   --   ALT 26  --   --   ALKPHOS 73  --   --   BILITOT 0.9  --   --    Iron/TIBC/Ferritin/ %Sat    Component Value Date/Time   IRON 100 09/06/2019 1200   TIBC 368 09/06/2019 1200   FERRITIN 198 09/06/2019 1200   IRONPCTSAT 27 09/06/2019 1200  RADIOGRAPHIC STUDIES: I have personally reviewed the radiological images  as listed and agreed with the findings in the report. CT ABDOMEN PELVIS W CONTRAST  Result Date: 10/27/2019 CLINICAL DATA:  History of low anterior resection to remove a rectosigmoid carcinoma with contained anastomotic leak ultimately requiring CT-guided placement of a percutaneous drainage catheter placement on 10/18/2019 Patient presents today for abdominal CT and potential drainage catheter injection. Patient reports little to no output from the percutaneous catheter for the past several days. EXAM: CT ABDOMEN AND PELVIS WITH CONTRAST TECHNIQUE: Multidetector CT imaging of the abdomen and pelvis was performed using the standard protocol following bolus administration of intravenous contrast. CONTRAST:  125m OMNIPAQUE IOHEXOL 300 MG/ML  SOLN COMPARISON:  CT abdomen and pelvis-10/13/2019; CT-guided left lower abdominal/pelvic percutaneous drainage catheter placement-10/18/2019 FINDINGS: Lower chest: Limited visualization of the lower thorax redemonstrates minimal bibasilar reticular and ground-glass opacities with subpleural sparing. No discrete focal airspace opacities. No pleural effusion. Cardiomegaly. Post median sternotomy. Calcifications within native coronary arteries. No pericardial effusion. Hepatobiliary: Mild nodularity hepatic contour. There is a minimal amount of focal fatty infiltration adjacent to the fissure for the ligamentum teres. No discrete hepatic lesions. There is a punctate granuloma within the dome of the right lobe of the liver. Post cholecystectomy. No intra extrahepatic bili duct dilatation. No ascites. Pancreas: Normal appearance of the pancreas. Spleen: Normal appearance of the spleen. Adrenals/Urinary Tract: There is symmetric enhancement and excretion of the bilateral kidneys. There is a punctate (2 mm) nonobstructing stone within the inferior pole the left kidney (image 50, series 6). No evidence of right-sided nephrolithiasis on this postcontrast examination. Note is made of  an approximately 8.4 cm hypoattenuating nonenhancing exophytic cyst arising from the anterior superior pole of the left kidney (image 36, series 3). There is an approximately 0.8 cm hyperattenuating nonenhancing partially exophytic lesion arising from the inferior pole the left kidney (axial image 51, series 3; image 31, series 8; coronal image 49, series 6), which is too small to accurately characterize though may represent a hyperdense renal cyst. No discrete right-sided renal lesions. There is a minimal amount of likely age and body habitus related bilateral symmetric perinephric stranding. No evidence of urinary obstruction. Normal appearance the bilateral adrenal glands. Normal appearance of the urinary bladder given degree of distention. Stomach/Bowel: Stable positioning of left lower abdominal/pelvic percutaneous drainage catheter with resolution of previously noted pericolonic abscess. There is a minimal amount of ill-defined stranding about the descending colon with minimal amount of free fluid in the pelvic cul-de-sac but without definable/drainable fluid collection within the abdomen or pelvis. Interval resolution of postoperative pneumoperitoneum. Stable primary colonic anastomosis within the distal sigmoid/rectum. Large colonic stool burden without evidence of enteric obstruction. Normal appearance of the terminal ileum and appendix. No discrete areas of bowel wall thickening. No pneumoperitoneum, pneumatosis or portal venous gas. Vascular/Lymphatic: Moderate to large amount of mixed calcified and noncalcified atherosclerotic plaque within normal caliber abdominal aorta. The major branch vessels of the abdominal aorta appear patent on this non CTA examination. No bulky retroperitoneal, mesenteric, pelvic or inguinal lymphadenopathy. Reproductive: Dystrophic calcifications within normal sized prostate gland. There is a small amount of likely reactive free fluid in the pelvic cul-de-sac. Other: Interval  resolution of previously noted left lateral abdominal wall subcutaneous emphysema. Small bilateral mesenteric fat containing inguinal hernias. Linear areas of subcutaneous stranding involving the ventral aspect of the abdominal wall likely represent laparotomy ports (axial images 24, 27, 32 and 46). No associated hernia. There is a minimal amount  of subcutaneous edema about the midline of the low back. Musculoskeletal: No acute or aggressive osseous abnormalities. 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 Moderate bilateral facet degenerative change of the lower lumbar spine. Moderate degenerative change the bilateral hips with joint space loss, subchondral sclerosis and osteophytosis. IMPRESSION: 1. Complete resolution of left lower abdominal/pelvic abscess following percutaneous drainage catheter placement. 2. There is a minimal amount of mesenteric stranding about the descending colon and ill-defined free fluid in the pelvic cul-de-sac without new definable/drainable fluid collection within the abdomen or pelvis. 3. Interval resolution of postoperative pneumoperitoneum and body wall subcutaneous emphysema. 4. Stable sequela of distal colonic primary anastomosis without evidence of enteric obstruction. 5. Extensive colonic diverticulosis without evidence superimposed acute diverticulitis. 6. Large colonic stool burden. 7. Punctate (2 mm) nonobstructing left-sided renal stone. 8. Mild nodularity of the hepatic contour as could be seen in the setting of early cirrhotic change. Correlation with LFTs is advised. 9.  Aortic Atherosclerosis (ICD10-I70.0). PLAN: Patient subsequently underwent percutaneous drainage catheter injection. Electronically Signed   By: Sandi Mariscal M.D.   On: 10/27/2019 11:06   CT ABDOMEN PELVIS W CONTRAST  Result Date: 10/13/2019 CLINICAL DATA:  74 year old male with concern for  peritonitis and bowel perforation. Pneumoperitoneum seen on earlier radiograph. EXAM: CT ABDOMEN AND PELVIS WITH CONTRAST TECHNIQUE: Multidetector CT imaging of the abdomen and pelvis was performed using the standard protocol following bolus administration of intravenous contrast. CONTRAST:  159m OMNIPAQUE IOHEXOL 300 MG/ML  SOLN COMPARISON:  Abdominal radiograph dated 10/13/2019 and CT dated 09/06/2019 FINDINGS: Lower chest: Minimal bibasilar dependent atelectasis/scarring or sequela of prior infectious process. This findings are similar to the CT of 09/06/2019. Advanced 3 vessel coronary vascular calcification. There is moderate pneumoperitoneum with the majority of the air in the upper abdomen beneath the diaphragm. Small perihepatic free fluid. Hepatobiliary: Probable background of fatty liver. No intrahepatic biliary ductal dilatation. Cholecystectomy. No retained calcified stone in the central CBD. Pancreas: Unremarkable. No pancreatic ductal dilatation or surrounding inflammatory changes. Spleen: Normal in size without focal abnormality. Adrenals/Urinary Tract: The adrenal glands are unremarkable. There is no hydronephrosis on either side. There is symmetric enhancement and excretion of contrast by both kidneys. There is a 9 cm left renal cyst. The visualized ureters and urinary bladder appear unremarkable. Stomach/Bowel: There is sigmoid diverticulosis with muscular hypertrophy. Postsurgical changes of the sigmoid colon with anastomotic suture. Extraluminal air noted adjacent to the sigmoid suture concerning for sutural dehiscence. There is extensive sigmoid diverticulosis and diffuse colonic diverticula. No definite active inflammation. There is no bowel obstruction. The appendix is surgically absent. Vascular/Lymphatic: Advanced aortoiliac atherosclerotic disease. The IVC is unremarkable. No portal venous gas. There is no adenopathy. Reproductive: The prostate and seminal vesicles are grossly  unremarkable. Other: There is a 5.2 x 4.1 cm loculated fluid collection in the left lower abdomen concerning for an infected collection or abscess, possibly sequela of prior sigmoid diverticulitis. Clinical correlation is recommended. Musculoskeletal: Degenerative changes of the spine. No acute osseous pathology. IMPRESSION: 1. Moderate pneumoperitoneum with the majority of the air in the upper abdomen beneath the diaphragm. A pocket of air noted adjacent to the sigmoid anastomotic suture suggests sutural dehiscence. 2. Extensive colonic diverticulosis. No bowel obstruction. 3. A 5.2 x 4.1 cm loculated fluid collection in the left lower abdomen concerning for an infected collection or abscess, possibly sequela of prior sigmoid diverticulitis. Clinical correlation is recommended. 4.  Aortic Atherosclerosis (ICD10-I70.0). These results were called by telephone at the time of interpretation on 10/13/2019 at 3:37 pm to provider [facility] CINTRON-DIAZ , who verbally acknowledged these results. Electronically Signed   By: Anner Crete M.D.   On: 10/13/2019 15:50   IR Sinus/Fist Tube Chk-Non GI  Result Date: 10/27/2019 CLINICAL DATA:  History of low anterior resection for rectosigmoid carcinoma with contained anastomotic leak, ultimately requiring CT-guided placement of percutaneous catheter on 10/18/2019 Kathlene Cote). CT scan performed earlier today demonstrates complete resolution of the pericolonic abscess. As such patient now presents for drainage catheter injection. Patient reports little to no output from the percutaneous drainage catheter for the past several days. EXAM: SINUS TRACT INJECTION/FISTULOGRAM COMPARISON:  CT abdomen and pelvis-earlier same day; 10/13/2019; CT-guided percutaneous catheter placement-10/18/2019 CONTRAST:  15 cc Omnipaque 300 - administered via the existing percutaneous drain. FLUOROSCOPY TIME:  42 seconds (14 mGy) TECHNIQUE: The patient was positioned supine on the fluoroscopy table. A  preprocedural spot fluoroscopic image was obtained of the left lower abdomen/pelvis and the existing percutaneous drainage catheter. Multiple spot fluoroscopic and radiographic images were obtained following the injection of a small amount of contrast via the existing percutaneous drainage catheter. Images reviewed and discussed with the patient. The decision was made to remove the percutaneous drainage catheter. As such, the external portion of the drainage catheter was cut and drainage catheters removed intact. A dressing was applied. The patient tolerated the procedure well without immediate postprocedural complication. FINDINGS: Preprocedural spot fluoroscopic image demonstrates a percutaneous drainage catheter overlying the left lower abdomen/pelvis. Excreted contrast from recent abdominal CT is seen within the urinary bladder. Contrast injection demonstrates opacification of decompressed abscess cavity with reflux of contrast along the catheter tract. There is no definitive communication between the decompressed abscess cavity and the adjacent intestines as confirmed following aspiration of the entirety of the injected enteric contrast. IMPRESSION: Drain injection is negative for the presence of an enteric fistula. Given lack of output from the drainage catheter for the past several days, resolution of the pericolonic abscess on preceding abdominal CT and injection being negative for the presence of an enteric fistula, the drainage catheter was removed at the patient's bedside without incident. PLAN: Patient instructed to maintain all follow-up appointments with his referring surgeon Windell Moment) as well as oncologist (Dr. Tasia Catchings), and may otherwise follow-up at the interventional radiology drain clinic a p.r.n. basis. Electronically Signed   By: Sandi Mariscal M.D.   On: 10/27/2019 12:11   DG Chest Port 1 View  Addendum Date: 10/13/2019   ADDENDUM REPORT: 10/13/2019 10:57 ADDENDUM: Free air versus is bowel  related artifact beneath the LEFT hemidiaphragm was discussed with the referring physician is outlined below. These results were called by telephone at the time of interpretation on 10/13/2019 at 10:57 am to provider [facility] CINTRON-DIAZ , who verbally acknowledged these results. Electronically Signed   By: Zetta Bills M.D.   On: 10/13/2019 10:57   Result Date: 10/13/2019 CLINICAL DATA:  Postop Port-A-Cath placement EXAM: PORTABLE CHEST 1 VIEW COMPARISON:  Chest abdomen and pelvis CT 09/06/2019 FINDINGS: RIGHT IJ Port-A-Cath terminates in the mid to distal superior vena cava. Cardiomediastinal contours are normal. Post median sternotomy and CABG. Lungs are clear.  No sign of pneumothorax. Lucency beneath the LEFT hemidiaphragm is not clearly within bowel loops and is seen extending laterally. No lucency beneath RIGHT hemidiaphragm. IMPRESSION: RIGHT IJ Port-A-Cath terminates in the mid to distal superior vena cava. No pneumothorax. Question of free air beneath the LEFT hemidiaphragm,  potentially contained within loops of bowel though there are loops of bowel in this area neck can be visualized separate from the area of concern and extends more laterally than expected for bowel gas. Dedicated upright abdominal radiograph may be helpful. A call is out to the referring provider to further discuss findings in the above case. Electronically Signed: By: Zetta Bills M.D. On: 10/13/2019 10:47   DG Abd 2 Views  Result Date: 10/13/2019 CLINICAL DATA:  Question of free air on chest x-ray. EXAM: ABDOMEN - 2 VIEW COMPARISON:  Chest x-ray same date FINDINGS: There is lucency beneath RIGHT and LEFT hemidiaphragm. Median sternotomy changes are demonstrated. Opacity of gas in the abdomen within loops of bowel. Post cholecystectomy. Spinal degenerative changes as before. IMPRESSION: Lucency beneath the RIGHT and LEFT hemidiaphragm. This could be due to free air, the patient did undergo a surgical procedure greater than 2  weeks ago. Further evaluation with CT is recommended. No sign of bowel obstruction. These results were called by telephone at the time of interpretation on 10/13/2019 at 12:50 pm to provider [facility] CINTRON-DIAZ , who verbally acknowledged these results. Electronically Signed   By: Zetta Bills M.D.   On: 10/13/2019 12:53   DG BE (COLON)W SINGLE CM (SOL OR THIN BA)  Result Date: 10/14/2019 CLINICAL DATA:  Large bowel anastomotic leak. EXAM: WATER SOLUBLE CONTRAST ENEMA TECHNIQUE: Standard water soluble contrast enema performed with multiple images obtained. FLUOROSCOPY TIME:  Fluoroscopy Time:  2 minutes 24 seconds Radiation Exposure Index (if provided by the fluoroscopic device): 125.6 mGy COMPARISON:  CT 10/13/2019. FINDINGS: Narrowing of the rectosigmoid colon the level of the anastomosis is noted. What appears to be a prominent contained leak noted at this level. Remainder of the colon is widely patent with scattered diverticuli. No mass lesion or obstructing lesion otherwise noted. Terminal ileum not identified. IMPRESSION: Narrowing of the recto sigmoid at the level of the anastomosis. What appears to be a prominent contained leak is noted at this level. These results will be called to the ordering clinician or representative by the Radiologist Assistant, and communication documented in the PACS or [employer]. Electronically Signed   By: Marcello Moores  Register   On: 10/14/2019 11:53   DG C-Arm 1-60 Min-No Report  Result Date: 10/13/2019 Fluoroscopy was utilized by the requesting physician.  No radiographic interpretation.   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 Contained anastomotic leak has been a covered by imaging with development a left lower quadrant pelvic fluid collection/abscess. The patient presents for drainage of the fluid collection. EXAM: CT GUIDED CATHETER DRAINAGE OF  PERITONEAL ABSCESS ANESTHESIA/SEDATION: 2.0 mg IV Versed 100 mcg IV Fentanyl Total Moderate Sedation Time:  20 minutes The patient's level of consciousness and physiologic status were continuously monitored during the procedure by Radiology nursing. PROCEDURE: The procedure, risks, benefits, and alternatives were explained to the patient. Questions regarding the procedure were encouraged and answered. The patient understands and consents to the procedure. A time out was performed prior to initiating the procedure. The left lateral abdominal wall was prepped with chlorhexidine in a sterile fashion, and a sterile drape was applied covering the operative field. A sterile gown and sterile gloves were used for the procedure. Local anesthesia was provided with 1% Lidocaine. CT was performed initially in a supine position followed by a slightly oblique position with the left side raised. From this position, an 18 gauge  trocar needle was advanced from an anterolateral approach to the level of the left lower quadrant fluid collection. After confirming needle tip position, fluid was aspirated from the needle. A guidewire was advanced into the collection and the needle removed. The percutaneous tract was dilated and a 12 French percutaneous drainage catheter advanced over the guidewire. Catheter position was confirmed by CT. Additional fluid was aspirated from the catheter and a fluid sample sent for culture analysis. The drainage catheter was flushed with sterile saline and attached to a suction bulb. The drain was secured at the skin with a Prolene retention suture and StatLock device. COMPLICATIONS: None FINDINGS: Very little anterior window was present to the level of the left-sided upper pelvic peritoneal fluid collection which by CT today measures approximately 4.3 x 5.3 cm. In a slightly oblique position with the left side raise, there was a much improved anterolateral window allowing needle advancement followed by  drain placement. There was return of thick, tan colored fluid. A sample was sent for culture analysis. After drain placement, there is good return of fluid with suction bulb drainage. IMPRESSION: CT-guided percutaneous catheter drainage of peritoneal abscess in the left upper pelvis with return of thick, tan fluid. A sample was sent for culture analysis. A 12 French drainage catheter was placed and attached to suction bulb drainage. Electronically Signed   By: Aletta Edouard M.D.   On: 10/18/2019 13:33      ASSESSMENT & PLAN:  1. Port-A-Cath in place   2. Rectal cancer ([place])   3. Encounter for antineoplastic chemotherapy   4. Elevated blood pressure reading    #Rectal cancer stage III I discussed with surgery Dr. Peyton Najjar prior to this visit and patient is cleared from surgical standpoint to proceed with adjuvant chemotherapy. Patient has had Mediport placed and has been to chemotherapy class. Labs are reviewed and discussed with him. Count acceptable to proceed with cycle 1 FOLFOX. I discussed in details about antiemetics instructions. Patient will follow up in 1 week for assessment of tolerability of chemotherapy.  #Elevated blood pressure reading. He denies any diagnosed hypertension history.  He noticed that his blood pressure has been high ever since cancer diagnosis. He is not currently on any blood pressure medication.  Low-salt diet recommended. His blood pressure was elevated at 170/ 80.  Repeat blood pressure improved to 161/78. After he finished FOLFOX, blood pressure was elevated 192/86 Patient denies any focal deficit, headache.  Patient was recommended to go to emergency room for evaluation and treatment for hypertension urgency.  Patient declined.  We will change to call primary care provider for further evaluation and start pressure medication.  #Mild anemia, hemoglobin 12.5, stable.  Continue to monitor.  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  Discussed with the patient and encourage him to further discuss with primary care provider for the need of see pulmonology All questions were answered. The patient knows to call the clinic with any problems questions or concerns.  Return of visit: 1 week Earlie Server, MD, PhD Hematology Oncology [facility] at [facility] Pager- [phone] 11/02/2019

## 2019-11-02 NOTE — Progress Notes (Signed)
Pt here follow up and new tx.

## 2019-11-02 NOTE — Progress Notes (Signed)
1336: Patient completed treatment. BP 196/96 HR 81. This was after ambulating to restroom. Denies any s/s.   1352: rechecked BP after having patient rest: BP 187/87, HR 76. Patient denies any s/s.   Dr. Tasia Catchings and team made aware. It was recommended that patient go to ED if recheck is still elevated; BP 192/86 HR 79. Patient declined ED. Informed him he would need to inform PCP of HTN and educated patient on s/s as to when to go to ED since he is declining now. Encouraged patient to contact the office should he have any questions or concerns regarding treatment. Patient verbalizes understanding and denies any further questions or concerns.

## 2019-11-03 LAB — CEA: CEA: 6.9 ng/mL — ABNORMAL HIGH (ref 0.0–4.7)

## 2019-11-04 ENCOUNTER — Inpatient Hospital Stay: Payer: Medicare Other

## 2019-11-04 ENCOUNTER — Other Ambulatory Visit: Payer: Self-pay

## 2019-11-04 DIAGNOSIS — Z5111 Encounter for antineoplastic chemotherapy: Secondary | ICD-10-CM | POA: Diagnosis not present

## 2019-11-04 DIAGNOSIS — C20 Malignant neoplasm of rectum: Secondary | ICD-10-CM

## 2019-11-04 MED ORDER — HEPARIN SOD (PORK) LOCK FLUSH 100 UNIT/ML IV SOLN
500.0000 [IU] | Freq: Once | INTRAVENOUS | Status: AC | PRN
  Administered 2019-11-04: 500 [IU]
  Filled 2019-11-04: qty 5

## 2019-11-04 MED ORDER — SODIUM CHLORIDE 0.9% FLUSH
10.0000 mL | INTRAVENOUS | Status: DC | PRN
  Administered 2019-11-04: 10 mL
  Filled 2019-11-04: qty 10

## 2019-11-04 MED ORDER — HEPARIN SOD (PORK) LOCK FLUSH 100 UNIT/ML IV SOLN
INTRAVENOUS | Status: AC
  Filled 2019-11-04: qty 5

## 2019-11-09 ENCOUNTER — Inpatient Hospital Stay: Payer: Medicare Other

## 2019-11-09 ENCOUNTER — Other Ambulatory Visit: Payer: Self-pay

## 2019-11-09 ENCOUNTER — Encounter: Payer: Self-pay | Admitting: Oncology

## 2019-11-09 ENCOUNTER — Inpatient Hospital Stay (HOSPITAL_BASED_OUTPATIENT_CLINIC_OR_DEPARTMENT_OTHER): Payer: Medicare Other | Admitting: Oncology

## 2019-11-09 VITALS — BP 149/74 | HR 71 | Temp 97.3°F | Resp 18 | Wt 208.5 lb

## 2019-11-09 DIAGNOSIS — C20 Malignant neoplasm of rectum: Secondary | ICD-10-CM

## 2019-11-09 DIAGNOSIS — R030 Elevated blood-pressure reading, without diagnosis of hypertension: Secondary | ICD-10-CM | POA: Diagnosis not present

## 2019-11-09 DIAGNOSIS — N281 Cyst of kidney, acquired: Secondary | ICD-10-CM | POA: Diagnosis not present

## 2019-11-09 DIAGNOSIS — Z5111 Encounter for antineoplastic chemotherapy: Secondary | ICD-10-CM | POA: Diagnosis not present

## 2019-11-09 LAB — CBC WITH DIFFERENTIAL/PLATELET
Abs Immature Granulocytes: 0.02 10*3/uL (ref 0.00–0.07)
Basophils Absolute: 0 10*3/uL (ref 0.0–0.1)
Basophils Relative: 1 %
Eosinophils Absolute: 0.8 10*3/uL — ABNORMAL HIGH (ref 0.0–0.5)
Eosinophils Relative: 10 %
HCT: 36.8 % — ABNORMAL LOW (ref 39.0–52.0)
Hemoglobin: 12.8 g/dL — ABNORMAL LOW (ref 13.0–17.0)
Immature Granulocytes: 0 %
Lymphocytes Relative: 36 %
Lymphs Abs: 2.9 10*3/uL (ref 0.7–4.0)
MCH: 32.2 pg (ref 26.0–34.0)
MCHC: 34.8 g/dL (ref 30.0–36.0)
MCV: 92.5 fL (ref 80.0–100.0)
Monocytes Absolute: 0.6 10*3/uL (ref 0.1–1.0)
Monocytes Relative: 8 %
Neutro Abs: 3.7 10*3/uL (ref 1.7–7.7)
Neutrophils Relative %: 45 %
Platelets: 147 10*3/uL — ABNORMAL LOW (ref 150–400)
RBC: 3.98 MIL/uL — ABNORMAL LOW (ref 4.22–5.81)
RDW: 13.4 % (ref 11.5–15.5)
WBC: 8 10*3/uL (ref 4.0–10.5)
nRBC: 0 % (ref 0.0–0.2)

## 2019-11-09 LAB — COMPREHENSIVE METABOLIC PANEL
ALT: 26 U/L (ref 0–44)
AST: 27 U/L (ref 15–41)
Albumin: 4.2 g/dL (ref 3.5–5.0)
Alkaline Phosphatase: 90 U/L (ref 38–126)
Anion gap: 10 (ref 5–15)
BUN: 13 mg/dL (ref 8–23)
CO2: 24 mmol/L (ref 22–32)
Calcium: 9.1 mg/dL (ref 8.9–10.3)
Chloride: 104 mmol/L (ref 98–111)
Creatinine, Ser: 0.96 mg/dL (ref 0.61–1.24)
GFR calc Af Amer: >60 mL/min (ref >60)
GFR calc non Af Amer: >60 mL/min (ref >60)
Glucose, Bld: 117 mg/dL — ABNORMAL HIGH (ref 70–99)
Potassium: 4.1 mmol/L (ref 3.5–5.1)
Sodium: 138 mmol/L (ref 135–145)
Total Bilirubin: 0.8 mg/dL (ref 0.3–1.2)
Total Protein: 8 g/dL (ref 6.5–8.1)

## 2019-11-09 MED ORDER — HEPARIN SOD (PORK) LOCK FLUSH 100 UNIT/ML IV SOLN
500.0000 [IU] | Freq: Once | INTRAVENOUS | Status: AC
  Administered 2019-11-09: 500 [IU] via INTRAVENOUS
  Filled 2019-11-09: qty 5

## 2019-11-09 MED ORDER — SODIUM CHLORIDE 0.9% FLUSH
10.0000 mL | INTRAVENOUS | Status: DC | PRN
  Administered 2019-11-09: 10 mL via INTRAVENOUS
  Filled 2019-11-09: qty 10

## 2019-11-09 MED ORDER — SENNA 8.6 MG PO TABS: 2.0000 | ORAL_TABLET | Freq: Every day | ORAL | 1 refills | Status: DC

## 2019-11-09 MED ORDER — SODIUM CHLORIDE 0.9 % IV SOLN
Freq: Once | INTRAVENOUS | Status: AC
  Filled 2019-11-09: qty 250

## 2019-11-09 MED ORDER — HEPARIN SOD (PORK) LOCK FLUSH 100 UNIT/ML IV SOLN
INTRAVENOUS | Status: AC
  Filled 2019-11-09: qty 5

## 2019-11-09 MED ORDER — POLYETHYLENE GLYCOL 3350 17 G PO PACK
17.0000 g | PACK | Freq: Every day | ORAL | 1 refills | Status: DC | PRN
Start: 2019-11-09 — End: 2020-04-18

## 2019-11-09 NOTE — Progress Notes (Signed)
Patient here for follow up. Pt reports having poor appetite and constipation. 6 pound weight loss recorded since last visit on 6/22.  PCP restarted pt on metoprolol 31m Bid.

## 2019-11-09 NOTE — Progress Notes (Signed)
.  Hematology/Oncology follow up  note Garrett Peterson [phone] [phone]   Patient Care Team: Tate, Denny C, MD as PCP - General (Internal Medicine) Stanton, Kristi D, RN as Oncology Nurse Navigator Yu, Zhou, MD as Consulting Physician (Oncology)  REFERRING PROVIDER: Tate, Denny C, MD  CHIEF COMPLAINTS/REASON FOR VISIT:  Follow-up for colorectal cancer  HISTORY OF PRESENTING ILLNESS:   Garrett Peterson is a  74 y.o.  male with PMH listed below was seen in consultation at the request of  Tate, Denny C, MD  for evaluation of sigmoid cancer  Patient recently had colonoscopy done by Dr. Toledo for follow-up on history of colon polyps.  Patient also has reported recent history of change of bowel habits.  He has felt more constipated. No blood in the stool, abdominal pain, unintentional weight loss or fever or chills. Colonoscopy records is at [facility] and is not available to me. 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  MMR is deferred to resection specimen.  # 09/20/2019, patient underwent robotic assisted laparoscopic low anterior resection. Pathology report was reviewed by me and discussed with patient and his wife. I also called pathology and discussed with Dr. Rubinas. The tumor is located 1.5 cm distal to the rectosigmoid junction.  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  I explained to patient the different final diagnosis of cancer of rectum rather than endoscopic impression of distal sigmoid colon cancer.  Tumor was not visualized on preop CT scan.  -pT3 pN1 M0, grade 2 adenocarcinoma. MMR status is intact  All margins were uninvolved by invasive carcinoma, high-grade dysplasia/intramucosal adenocarcinoma and low-grade dysplastic.  1 out of 8 regional lymph nodes were positive for malignancy. Patient has lymphovascular  invasion as well as perineural invasion. I discussed with patient that ideally his rectal cancer would be better treated with total neoadjuvant treatment prior to the surgery if we would know his final diagnosis will be an upper rectal cancer.  Preoperative treatments is associated with a more favorable long-term toxicity profile and fever local recurrence then postoperative therapy.  However overall survival appears to be similar.In this case, I would recommend chemotherapy and adjuvant chemo- radiation  # 10/13/2019 patient had CT abdomen pelvis with contrast for evaluation of concern of peritonitis and bowel perforation.  Pneumoperitoneum was seen on earlier radiograph.  There was moderate pneumoperitoneum, extensive colonic diverticulosis.  5.2 x 4.1 cm loculated fluid collection in the left lower abdomen concerning for an infected collection or abscess. Patient had a drain placed and finished a course of antibiotics.  Draining catheter was pulled.  #11/02/2019, patient started adjuvant FOLFOX treatments.  INTERVAL HISTORY Garrett Peterson is a 74 y.o. male who has above history reviewed by me today presents for follow up visit for management of colorectal cancer. Problems and complaints are listed below: Patient status post cycle 1 FOLFOX. Today his main concern is severe constipation.  Last bowel movement was 3 days ago. Denies any nausea, vomiting, fever, or chills. Blood pressure is better controlled. He has decreased appetite.  Not eating much.  He has lost 4 pounds  Review of Systems  Constitutional: Negative for appetite change, chills, fatigue, fever and unexpected weight change.  HENT:   Negative for hearing loss and voice change.   Eyes: Negative for eye problems and icterus.  Respiratory: Negative for chest tightness, cough and shortness of breath.   Cardiovascular: Negative   for chest pain and leg swelling.  Gastrointestinal: Negative for abdominal distention, abdominal pain and  blood in stool.  Endocrine: Negative for hot flashes.  Genitourinary: Negative for difficulty urinating, dysuria and frequency.   Musculoskeletal: Negative for arthralgias.  Skin: Negative for itching and rash.  Neurological: Negative for extremity weakness, light-headedness and numbness.  Hematological: Negative for adenopathy. Does not bruise/bleed easily.  Psychiatric/Behavioral: Negative for confusion.    MEDICAL HISTORY:  Past Medical History:  Diagnosis Date  . Arthritis   . Basal cell carcinoma   . CAD (coronary artery disease)   . Cancer (HCC) 2021   rectal  . Hyperlipidemia   . Pre-diabetes     SURGICAL HISTORY: Past Surgical History:  Procedure Laterality Date  . APPENDECTOMY    . CARDIAC CATHETERIZATION    . COLONOSCOPY    . CORONARY ARTERY BYPASS GRAFT    . IR SINUS/FIST TUBE CHK-NON GI  10/27/2019  . open heart surgery    . PORTACATH PLACEMENT Right 10/13/2019   Procedure: INSERTION PORT-A-CATH;  Surgeon: Cintron-Diaz, Edgardo, MD;  Location: [facility] ORS;  Service: General;  Laterality: Right;    SOCIAL HISTORY: Social History   Socioeconomic History  . Marital status: Married    Spouse name: Not on file  . Number of children: Not on file  . Years of education: Not on file  . Highest education level: Not on file  Occupational History  . Not on file  Tobacco Use  . Smoking status: Never Smoker  . Smokeless tobacco: Never Used  Vaping Use  . Vaping Use: Never used  Substance and Sexual Activity  . Alcohol use: Not Currently    Alcohol/week: 0.0 standard drinks  . Drug use: Never  . Sexual activity: Not Currently  Other Topics Concern  . Not on file  Social History Narrative  . Not on file   Social Determinants of Health   Financial Resource Strain:   . Difficulty of Paying Living Expenses:   Food Insecurity:   . Worried About Running Out of Food in the Last Year:   . Ran Out of Food in the Last Year:   Transportation Needs:   . Lack of  Transportation (Medical):   . Lack of Transportation (Non-Medical):   Physical Activity:   . Days of Exercise per Week:   . Minutes of Exercise per Session:   Stress:   . Feeling of Stress :   Social Connections:   . Frequency of Communication with Friends and Family:   . Frequency of Social Gatherings with Friends and Family:   . Attends Religious Services:   . Active Member of Clubs or Organizations:   . Attends Club or Organization Meetings:   . Marital Status:   Intimate Partner Violence:   . Fear of Current or Ex-Partner:   . Emotionally Abused:   . Physically Abused:   . Sexually Abused:     FAMILY HISTORY: Family History  Problem Relation Age of Onset  . Lung disease Father     ALLERGIES:  has No Known Allergies.  MEDICATIONS:  Current Outpatient Medications  Medication Sig Dispense Refill  . atorvastatin (LIPITOR) 20 MG tablet Take 20 mg by mouth every evening.     . lidocaine-prilocaine (EMLA) cream Apply to affected area once 30 g 3  . metoprolol tartrate (LOPRESSOR) 25 MG tablet Take 25 mg by mouth 2 (two) times daily.    . prochlorperazine (COMPAZINE) 10 MG tablet Take 1 tablet (10 mg total) by   mouth every 6 (six) hours as needed (Nausea or vomiting). 30 tablet 1  . temazepam (RESTORIL) 30 MG capsule Take 30 mg by mouth at bedtime.     . polyethylene glycol (MIRALAX) 17 g packet Take 17 g by mouth daily as needed for severe constipation. 20 each 1  . senna (SENOKOT) 8.6 MG TABS tablet Take 2 tablets (17.2 mg total) by mouth daily. 120 tablet 1   No current facility-administered medications for this visit.   Facility-Administered Medications Ordered in Other Visits  Medication Dose Route Frequency Provider Last Rate Last Admin  . sodium chloride flush (NS) 0.9 % injection 10 mL  10 mL Intravenous PRN Earlie Server, MD   10 mL at 11/09/19 1009     PHYSICAL EXAMINATION: ECOG PERFORMANCE STATUS: 0 - Asymptomatic Vitals:   11/09/19 1030  BP: (!) 149/74  Pulse:  71  Resp: 18  Temp: (!) 97.3 F (36.3 C)   Filed Weights   11/09/19 1030  Weight: 208 lb 8 oz (94.6 kg)    Physical Exam Constitutional:      General: He is not in acute distress. HENT:     Head: Normocephalic and atraumatic.  Eyes:     General: No scleral icterus. Cardiovascular:     Rate and Rhythm: Normal rate and regular rhythm.     Heart sounds: Normal heart sounds.  Pulmonary:     Effort: Pulmonary effort is normal. No respiratory distress.     Breath sounds: No wheezing.  Abdominal:     General: Bowel sounds are normal. There is no distension.     Palpations: Abdomen is soft.  Musculoskeletal:        General: No deformity. Normal range of motion.     Cervical back: Normal range of motion and neck supple.  Skin:    General: Skin is warm and dry.     Findings: No erythema or rash.     Comments: Right anterior chest wall + Mediport  Neurological:     Mental Status: He is alert and oriented to person, place, and time. Mental status is at baseline.     Cranial Nerves: No cranial nerve deficit.     Coordination: Coordination normal.  Psychiatric:        Mood and Affect: Mood normal.     LABORATORY DATA:  I have reviewed the data as listed Lab Results  Component Value Date   WBC 8.0 11/09/2019   HGB 12.8 (L) 11/09/2019   HCT 36.8 (L) 11/09/2019   MCV 92.5 11/09/2019   PLT 147 (L) 11/09/2019   Recent Labs    09/06/19 1200 09/06/19 1200 09/21/19 0626 09/21/19 0626 10/27/19 0821 11/02/19 0800 11/09/19 1009  NA 139   < > 139  --   --  140 138  K 4.1   < > 3.9  --   --  3.7 4.1  CL 103   < > 108  --   --  105 104  CO2 26   < > 24  --   --  27 24  GLUCOSE 121*   < > 116*  --   --  129* 117*  BUN 12   < > 12  --   --  11 13  CREATININE 1.10   < > 1.17   < > 0.90 0.83 0.96  CALCIUM 9.2   < > 8.3*  --   --  8.9 9.1  GFRNONAA >60   < > >60  --   --  >  60 >60  GFRAA >60   < > >60  --   --  >60 >60  PROT 8.2*  --   --   --   --  7.5 8.0  ALBUMIN 4.4  --   --    --   --  3.8 4.2  AST 23  --   --   --   --  23 27  ALT 26  --   --   --   --  22 26  ALKPHOS 73  --   --   --   --  91 90  BILITOT 0.9  --   --   --   --  0.5 0.8   < > = values in this interval not displayed.   Iron/TIBC/Ferritin/ %Sat    Component Value Date/Time   IRON 100 09/06/2019 1200   TIBC 368 09/06/2019 1200   FERRITIN 198 09/06/2019 1200   IRONPCTSAT 27 09/06/2019 1200      RADIOGRAPHIC STUDIES: I have personally reviewed the radiological images as listed and agreed with the findings in the report. CT ABDOMEN PELVIS W CONTRAST  Result Date: 10/27/2019 CLINICAL DATA:  History of low anterior resection to remove a rectosigmoid carcinoma with contained anastomotic leak ultimately requiring CT-guided placement of a percutaneous drainage catheter placement on 10/18/2019 Patient presents today for abdominal CT and potential drainage catheter injection. Patient reports little to no output from the percutaneous catheter for the past several days. EXAM: CT ABDOMEN AND PELVIS WITH CONTRAST TECHNIQUE: Multidetector CT imaging of the abdomen and pelvis was performed using the standard protocol following bolus administration of intravenous contrast. CONTRAST:  160m OMNIPAQUE IOHEXOL 300 MG/ML  SOLN COMPARISON:  CT abdomen and pelvis-10/13/2019; CT-guided left lower abdominal/pelvic percutaneous drainage catheter placement-10/18/2019 FINDINGS: Lower chest: Limited visualization of the lower thorax redemonstrates minimal bibasilar reticular and ground-glass opacities with subpleural sparing. No discrete focal airspace opacities. No pleural effusion. Cardiomegaly. Post median sternotomy. Calcifications within native coronary arteries. No pericardial effusion. Hepatobiliary: Mild nodularity hepatic contour. There is a minimal amount of focal fatty infiltration adjacent to the fissure for the ligamentum teres. No discrete hepatic lesions. There is a punctate granuloma within the dome of the right  lobe of the liver. Post cholecystectomy. No intra extrahepatic bili duct dilatation. No ascites. Pancreas: Normal appearance of the pancreas. Spleen: Normal appearance of the spleen. Adrenals/Urinary Tract: There is symmetric enhancement and excretion of the bilateral kidneys. There is a punctate (2 mm) nonobstructing stone within the inferior pole the left kidney (image 50, series 6). No evidence of right-sided nephrolithiasis on this postcontrast examination. Note is made of an approximately 8.4 cm hypoattenuating nonenhancing exophytic cyst arising from the anterior superior pole of the left kidney (image 36, series 3). There is an approximately 0.8 cm hyperattenuating nonenhancing partially exophytic lesion arising from the inferior pole the left kidney (axial image 51, series 3; image 31, series 8; coronal image 49, series 6), which is too small to accurately characterize though may represent a hyperdense renal cyst. No discrete right-sided renal lesions. There is a minimal amount of likely age and body habitus related bilateral symmetric perinephric stranding. No evidence of urinary obstruction. Normal appearance the bilateral adrenal glands. Normal appearance of the urinary bladder given degree of distention. Stomach/Bowel: Stable positioning of left lower abdominal/pelvic percutaneous drainage catheter with resolution of previously noted pericolonic abscess. There is a minimal amount of ill-defined stranding about the descending colon with minimal amount of free fluid in the  pelvic cul-de-sac but without definable/drainable fluid collection within the abdomen or pelvis. Interval resolution of postoperative pneumoperitoneum. Stable primary colonic anastomosis within the distal sigmoid/rectum. Large colonic stool burden without evidence of enteric obstruction. Normal appearance of the terminal ileum and appendix. No discrete areas of bowel wall thickening. No pneumoperitoneum, pneumatosis or portal venous gas.  Vascular/Lymphatic: Moderate to large amount of mixed calcified and noncalcified atherosclerotic plaque within normal caliber abdominal aorta. The major branch vessels of the abdominal aorta appear patent on this non CTA examination. No bulky retroperitoneal, mesenteric, pelvic or inguinal lymphadenopathy. Reproductive: Dystrophic calcifications within normal sized prostate gland. There is a small amount of likely reactive free fluid in the pelvic cul-de-sac. Other: Interval resolution of previously noted left lateral abdominal wall subcutaneous emphysema. Small bilateral mesenteric fat containing inguinal hernias. Linear areas of subcutaneous stranding involving the ventral aspect of the abdominal wall likely represent laparotomy ports (axial images 24, 27, 32 and 46). No associated hernia. There is a minimal amount of subcutaneous edema about the midline of the low back. Musculoskeletal: No acute or aggressive osseous abnormalities. 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 Moderate bilateral facet degenerative change of the lower lumbar spine. Moderate degenerative change the bilateral hips with joint space loss, subchondral sclerosis and osteophytosis. IMPRESSION: 1. Complete resolution of left lower abdominal/pelvic abscess following percutaneous drainage catheter placement. 2. There is a minimal amount of mesenteric stranding about the descending colon and ill-defined free fluid in the pelvic cul-de-sac without new definable/drainable fluid collection within the abdomen or pelvis. 3. Interval resolution of postoperative pneumoperitoneum and body wall subcutaneous emphysema. 4. Stable sequela of distal colonic primary anastomosis without evidence of enteric obstruction. 5. Extensive colonic diverticulosis without evidence superimposed acute diverticulitis. 6. Large colonic stool burden. 7. Punctate  (2 mm) nonobstructing left-sided renal stone. 8. Mild nodularity of the hepatic contour as could be seen in the setting of early cirrhotic change. Correlation with LFTs is advised. 9.  Aortic Atherosclerosis (ICD10-I70.0). PLAN: Patient subsequently underwent percutaneous drainage catheter injection. Electronically Signed   By: Sandi Mariscal M.D.   On: 10/27/2019 11:06   CT ABDOMEN PELVIS W CONTRAST  Result Date: 10/13/2019 CLINICAL DATA:  74 year old male with concern for peritonitis and bowel perforation. Pneumoperitoneum seen on earlier radiograph. EXAM: CT ABDOMEN AND PELVIS WITH CONTRAST TECHNIQUE: Multidetector CT imaging of the abdomen and pelvis was performed using the standard protocol following bolus administration of intravenous contrast. CONTRAST:  143m OMNIPAQUE IOHEXOL 300 MG/ML  SOLN COMPARISON:  Abdominal radiograph dated 10/13/2019 and CT dated 09/06/2019 FINDINGS: Lower chest: Minimal bibasilar dependent atelectasis/scarring or sequela of prior infectious process. This findings are similar to the CT of 09/06/2019. Advanced 3 vessel coronary vascular calcification. There is moderate pneumoperitoneum with the majority of the air in the upper abdomen beneath the diaphragm. Small perihepatic free fluid. Hepatobiliary: Probable background of fatty liver. No intrahepatic biliary ductal dilatation. Cholecystectomy. No retained calcified stone in the central CBD. Pancreas: Unremarkable. No pancreatic ductal dilatation or surrounding inflammatory changes. Spleen: Normal in size without focal abnormality. Adrenals/Urinary Tract: The adrenal glands are unremarkable. There is no hydronephrosis on either side. There is symmetric enhancement and excretion of contrast by both kidneys. There is a 9 cm left renal cyst. The visualized ureters and urinary bladder appear unremarkable. Stomach/Bowel: There is sigmoid diverticulosis with muscular hypertrophy. Postsurgical changes of the sigmoid colon with anastomotic  suture. Extraluminal air noted adjacent  to the sigmoid suture concerning for sutural dehiscence. There is extensive sigmoid diverticulosis and diffuse colonic diverticula. No definite active inflammation. There is no bowel obstruction. The appendix is surgically absent. Vascular/Lymphatic: Advanced aortoiliac atherosclerotic disease. The IVC is unremarkable. No portal venous gas. There is no adenopathy. Reproductive: The prostate and seminal vesicles are grossly unremarkable. Other: There is a 5.2 x 4.1 cm loculated fluid collection in the left lower abdomen concerning for an infected collection or abscess, possibly sequela of prior sigmoid diverticulitis. Clinical correlation is recommended. Musculoskeletal: Degenerative changes of the spine. No acute osseous pathology. IMPRESSION: 1. Moderate pneumoperitoneum with the majority of the air in the upper abdomen beneath the diaphragm. A pocket of air noted adjacent to the sigmoid anastomotic suture suggests sutural dehiscence. 2. Extensive colonic diverticulosis. No bowel obstruction. 3. A 5.2 x 4.1 cm loculated fluid collection in the left lower abdomen concerning for an infected collection or abscess, possibly sequela of prior sigmoid diverticulitis. Clinical correlation is recommended. 4. Aortic Atherosclerosis (ICD10-I70.0). These results were called by telephone at the time of interpretation on 10/13/2019 at 3:37 pm to provider [facility] CINTRON-DIAZ , who verbally acknowledged these results. Electronically Signed   By: Anner Crete M.D.   On: 10/13/2019 15:50   IR Sinus/Fist Tube Chk-Non GI  Result Date: 10/27/2019 CLINICAL DATA:  History of low anterior resection for rectosigmoid carcinoma with contained anastomotic leak, ultimately requiring CT-guided placement of percutaneous catheter on 10/18/2019 Kathlene Cote). CT scan performed earlier today demonstrates complete resolution of the pericolonic abscess. As such patient now presents for drainage catheter  injection. Patient reports little to no output from the percutaneous drainage catheter for the past several days. EXAM: SINUS TRACT INJECTION/FISTULOGRAM COMPARISON:  CT abdomen and pelvis-earlier same day; 10/13/2019; CT-guided percutaneous catheter placement-10/18/2019 CONTRAST:  15 cc Omnipaque 300 - administered via the existing percutaneous drain. FLUOROSCOPY TIME:  42 seconds (14 mGy) TECHNIQUE: The patient was positioned supine on the fluoroscopy table. A preprocedural spot fluoroscopic image was obtained of the left lower abdomen/pelvis and the existing percutaneous drainage catheter. Multiple spot fluoroscopic and radiographic images were obtained following the injection of a small amount of contrast via the existing percutaneous drainage catheter. Images reviewed and discussed with the patient. The decision was made to remove the percutaneous drainage catheter. As such, the external portion of the drainage catheter was cut and drainage catheters removed intact. A dressing was applied. The patient tolerated the procedure well without immediate postprocedural complication. FINDINGS: Preprocedural spot fluoroscopic image demonstrates a percutaneous drainage catheter overlying the left lower abdomen/pelvis. Excreted contrast from recent abdominal CT is seen within the urinary bladder. Contrast injection demonstrates opacification of decompressed abscess cavity with reflux of contrast along the catheter tract. There is no definitive communication between the decompressed abscess cavity and the adjacent intestines as confirmed following aspiration of the entirety of the injected enteric contrast. IMPRESSION: Drain injection is negative for the presence of an enteric fistula. Given lack of output from the drainage catheter for the past several days, resolution of the pericolonic abscess on preceding abdominal CT and injection being negative for the presence of an enteric fistula, the drainage catheter was removed  at the patient's bedside without incident. PLAN: Patient instructed to maintain all follow-up appointments with his referring surgeon Windell Moment) as well as oncologist (Dr. Tasia Catchings), and may otherwise follow-up at the interventional radiology drain clinic a p.r.n. basis. Electronically Signed   By: Sandi Mariscal M.D.   On: 10/27/2019 12:11   DG Chest Port 1  View  Addendum Date: 10/13/2019   ADDENDUM REPORT: 10/13/2019 10:57 ADDENDUM: Free air versus is bowel related artifact beneath the LEFT hemidiaphragm was discussed with the referring physician is outlined below. These results were called by telephone at the time of interpretation on 10/13/2019 at 10:57 am to provider EDGARDO CINTRON-DIAZ , who verbally acknowledged these results. Electronically Signed   By: Geoffrey  Wile M.D.   On: 10/13/2019 10:57   Result Date: 10/13/2019 CLINICAL DATA:  Postop Port-A-Cath placement EXAM: PORTABLE CHEST 1 VIEW COMPARISON:  Chest abdomen and pelvis CT 09/06/2019 FINDINGS: RIGHT IJ Port-A-Cath terminates in the mid to distal superior vena cava. Cardiomediastinal contours are normal. Post median sternotomy and CABG. Lungs are clear.  No sign of pneumothorax. Lucency beneath the LEFT hemidiaphragm is not clearly within bowel loops and is seen extending laterally. No lucency beneath RIGHT hemidiaphragm. IMPRESSION: RIGHT IJ Port-A-Cath terminates in the mid to distal superior vena cava. No pneumothorax. Question of free air beneath the LEFT hemidiaphragm, potentially contained within loops of bowel though there are loops of bowel in this area neck can be visualized separate from the area of concern and extends more laterally than expected for bowel gas. Dedicated upright abdominal radiograph may be helpful. A call is out to the referring provider to further discuss findings in the above case. Electronically Signed: By: Geoffrey  Wile M.D. On: 10/13/2019 10:47   DG Abd 2 Views  Result Date: 10/13/2019 CLINICAL DATA:  Question of  free air on chest x-ray. EXAM: ABDOMEN - 2 VIEW COMPARISON:  Chest x-ray same date FINDINGS: There is lucency beneath RIGHT and LEFT hemidiaphragm. Median sternotomy changes are demonstrated. Opacity of gas in the abdomen within loops of bowel. Post cholecystectomy. Spinal degenerative changes as before. IMPRESSION: Lucency beneath the RIGHT and LEFT hemidiaphragm. This could be due to free air, the patient did undergo a surgical procedure greater than 2 weeks ago. Further evaluation with CT is recommended. No sign of bowel obstruction. These results were called by telephone at the time of interpretation on 10/13/2019 at 12:50 pm to provider EDGARDO CINTRON-DIAZ , who verbally acknowledged these results. Electronically Signed   By: Geoffrey  Wile M.D.   On: 10/13/2019 12:53   DG BE (COLON)W SINGLE CM (SOL OR THIN BA)  Result Date: 10/14/2019 CLINICAL DATA:  Large bowel anastomotic leak. EXAM: WATER SOLUBLE CONTRAST ENEMA TECHNIQUE: Standard water soluble contrast enema performed with multiple images obtained. FLUOROSCOPY TIME:  Fluoroscopy Time:  2 minutes 24 seconds Radiation Exposure Index (if provided by the fluoroscopic device): 125.6 mGy COMPARISON:  CT 10/13/2019. FINDINGS: Narrowing of the rectosigmoid colon the level of the anastomosis is noted. What appears to be a prominent contained leak noted at this level. Remainder of the colon is widely patent with scattered diverticuli. No mass lesion or obstructing lesion otherwise noted. Terminal ileum not identified. IMPRESSION: Narrowing of the recto sigmoid at the level of the anastomosis. What appears to be a prominent contained leak is noted at this level. These results will be called to the ordering clinician or representative by the Radiologist Assistant, and communication documented in the PACS or [facility] Dashboard. Electronically Signed   By: Thomas  Register   On: 10/14/2019 11:53   DG C-Arm 1-60 Min-No Report  Result Date: 10/13/2019 Fluoroscopy was  utilized by the requesting physician.  No radiographic interpretation.   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 Contained anastomotic   leak has been a covered by imaging with development a left lower quadrant pelvic fluid collection/abscess. The patient presents for drainage of the fluid collection. EXAM: CT GUIDED CATHETER DRAINAGE OF PERITONEAL ABSCESS ANESTHESIA/SEDATION: 2.0 mg IV Versed 100 mcg IV Fentanyl Total Moderate Sedation Time:  20 minutes The patient's level of consciousness and physiologic status were continuously monitored during the procedure by Radiology nursing. PROCEDURE: The procedure, risks, benefits, and alternatives were explained to the patient. Questions regarding the procedure were encouraged and answered. The patient understands and consents to the procedure. A time out was performed prior to initiating the procedure. The left lateral abdominal wall was prepped with chlorhexidine in a sterile fashion, and a sterile drape was applied covering the operative field. A sterile gown and sterile gloves were used for the procedure. Local anesthesia was provided with 1% Lidocaine. CT was performed initially in a supine position followed by a slightly oblique position with the left side raised. From this position, an 18 gauge trocar needle was advanced from an anterolateral approach to the level of the left lower quadrant fluid collection. After confirming needle tip position, fluid was aspirated from the needle. A guidewire was advanced into the collection and the needle removed. The percutaneous tract was dilated and a 12 French percutaneous drainage catheter advanced over the guidewire. Catheter position was confirmed by CT. Additional fluid was aspirated from the catheter and a fluid sample sent for culture analysis. The drainage catheter was flushed with sterile saline and  attached to a suction bulb. The drain was secured at the skin with a Prolene retention suture and StatLock device. COMPLICATIONS: None FINDINGS: Very little anterior window was present to the level of the left-sided upper pelvic peritoneal fluid collection which by CT today measures approximately 4.3 x 5.3 cm. In a slightly oblique position with the left side raise, there was a much improved anterolateral window allowing needle advancement followed by drain placement. There was return of thick, tan colored fluid. A sample was sent for culture analysis. After drain placement, there is good return of fluid with suction bulb drainage. IMPRESSION: CT-guided percutaneous catheter drainage of peritoneal abscess in the left upper pelvis with return of thick, tan fluid. A sample was sent for culture analysis. A 12 French drainage catheter was placed and attached to suction bulb drainage. Electronically Signed   By: Glenn  Yamagata M.D.   On: 10/18/2019 13:33      ASSESSMENT & PLAN:  1. Rectal cancer (HCC)   2. Elevated blood pressure reading    #Rectal cancer stage III Status post cycle 1 FOLFOX. Overall he tolerates well with mild difficulties. Labs reviewed and discussed with patient.  Poor appetite, weight loss.  Encourage nutrition supplements. I will give patient IV fluid 1 L of normal saline today.  Constipation, recommend patient to start Senokot 2 tablets daily, use MiraLAX as needed if symptoms are not improved by Senokot.  I discussed with him in details about the escalation of bowel regimen according to his symptoms.   #Elevated blood pressure reading.  Patient has been started on metoprolol 25 mg twice daily.  Blood pressure is better controlled. 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  Discussed with the patient and encourage him to further discuss with primary care provider for the need of see pulmonology All questions were answered.   The patient knows to call the clinic with any problems questions or concerns.  Return of visit: 1 week Zhou Yu, MD, PhD Hematology Oncology [facility] Pager- [phone] 11/09/2019  

## 2019-11-10 ENCOUNTER — Other Ambulatory Visit: Payer: Medicare Other

## 2019-11-10 ENCOUNTER — Ambulatory Visit: Payer: Medicare Other

## 2019-11-10 ENCOUNTER — Ambulatory Visit: Payer: Medicare Other | Admitting: Oncology

## 2019-11-17 ENCOUNTER — Other Ambulatory Visit: Payer: Self-pay

## 2019-11-17 ENCOUNTER — Inpatient Hospital Stay: Payer: Medicare Other

## 2019-11-17 ENCOUNTER — Encounter: Payer: Self-pay | Admitting: Oncology

## 2019-11-17 ENCOUNTER — Other Ambulatory Visit: Payer: Self-pay | Admitting: Oncology

## 2019-11-17 ENCOUNTER — Inpatient Hospital Stay: Payer: Medicare Other | Attending: Oncology

## 2019-11-17 ENCOUNTER — Inpatient Hospital Stay (HOSPITAL_BASED_OUTPATIENT_CLINIC_OR_DEPARTMENT_OTHER): Payer: Medicare Other | Admitting: Oncology

## 2019-11-17 VITALS — BP 131/71 | HR 52 | Temp 97.2°F | Resp 18 | Wt 211.8 lb

## 2019-11-17 DIAGNOSIS — Z79899 Other long term (current) drug therapy: Secondary | ICD-10-CM | POA: Insufficient documentation

## 2019-11-17 DIAGNOSIS — D701 Agranulocytosis secondary to cancer chemotherapy: Secondary | ICD-10-CM | POA: Diagnosis not present

## 2019-11-17 DIAGNOSIS — Z85828 Personal history of other malignant neoplasm of skin: Secondary | ICD-10-CM | POA: Diagnosis not present

## 2019-11-17 DIAGNOSIS — Z5111 Encounter for antineoplastic chemotherapy: Secondary | ICD-10-CM | POA: Insufficient documentation

## 2019-11-17 DIAGNOSIS — R634 Abnormal weight loss: Secondary | ICD-10-CM | POA: Insufficient documentation

## 2019-11-17 DIAGNOSIS — D696 Thrombocytopenia, unspecified: Secondary | ICD-10-CM

## 2019-11-17 DIAGNOSIS — C20 Malignant neoplasm of rectum: Secondary | ICD-10-CM | POA: Diagnosis present

## 2019-11-17 DIAGNOSIS — J984 Other disorders of lung: Secondary | ICD-10-CM | POA: Diagnosis not present

## 2019-11-17 DIAGNOSIS — I119 Hypertensive heart disease without heart failure: Secondary | ICD-10-CM | POA: Insufficient documentation

## 2019-11-17 DIAGNOSIS — R7303 Prediabetes: Secondary | ICD-10-CM | POA: Diagnosis not present

## 2019-11-17 DIAGNOSIS — K5900 Constipation, unspecified: Secondary | ICD-10-CM | POA: Diagnosis not present

## 2019-11-17 DIAGNOSIS — D649 Anemia, unspecified: Secondary | ICD-10-CM | POA: Insufficient documentation

## 2019-11-17 DIAGNOSIS — N281 Cyst of kidney, acquired: Secondary | ICD-10-CM | POA: Diagnosis not present

## 2019-11-17 DIAGNOSIS — T451X5A Adverse effect of antineoplastic and immunosuppressive drugs, initial encounter: Secondary | ICD-10-CM | POA: Insufficient documentation

## 2019-11-17 DIAGNOSIS — Z8719 Personal history of other diseases of the digestive system: Secondary | ICD-10-CM | POA: Insufficient documentation

## 2019-11-17 LAB — CBC WITH DIFFERENTIAL/PLATELET
Abs Immature Granulocytes: 0 10*3/uL (ref 0.00–0.07)
Basophils Absolute: 0.1 10*3/uL (ref 0.0–0.1)
Basophils Relative: 1 %
Eosinophils Absolute: 0.2 10*3/uL (ref 0.0–0.5)
Eosinophils Relative: 5 %
HCT: 38 % — ABNORMAL LOW (ref 39.0–52.0)
Hemoglobin: 13.2 g/dL (ref 13.0–17.0)
Immature Granulocytes: 0 %
Lymphocytes Relative: 46 %
Lymphs Abs: 2.1 10*3/uL (ref 0.7–4.0)
MCH: 32.4 pg (ref 26.0–34.0)
MCHC: 34.7 g/dL (ref 30.0–36.0)
MCV: 93.1 fL (ref 80.0–100.0)
Monocytes Absolute: 0.7 10*3/uL (ref 0.1–1.0)
Monocytes Relative: 15 %
Neutro Abs: 1.5 10*3/uL — ABNORMAL LOW (ref 1.7–7.7)
Neutrophils Relative %: 33 %
Platelets: 105 10*3/uL — ABNORMAL LOW (ref 150–400)
RBC: 4.08 MIL/uL — ABNORMAL LOW (ref 4.22–5.81)
RDW: 14.5 % (ref 11.5–15.5)
WBC: 4.6 10*3/uL (ref 4.0–10.5)
nRBC: 0 % (ref 0.0–0.2)

## 2019-11-17 LAB — COMPREHENSIVE METABOLIC PANEL
ALT: 31 U/L (ref 0–44)
AST: 27 U/L (ref 15–41)
Albumin: 3.9 g/dL (ref 3.5–5.0)
Alkaline Phosphatase: 92 U/L (ref 38–126)
Anion gap: 9 (ref 5–15)
BUN: 13 mg/dL (ref 8–23)
CO2: 26 mmol/L (ref 22–32)
Calcium: 9 mg/dL (ref 8.9–10.3)
Chloride: 106 mmol/L (ref 98–111)
Creatinine, Ser: 0.92 mg/dL (ref 0.61–1.24)
GFR calc Af Amer: >60 mL/min (ref >60)
GFR calc non Af Amer: >60 mL/min (ref >60)
Glucose, Bld: 132 mg/dL — ABNORMAL HIGH (ref 70–99)
Potassium: 3.9 mmol/L (ref 3.5–5.1)
Sodium: 141 mmol/L (ref 135–145)
Total Bilirubin: 0.5 mg/dL (ref 0.3–1.2)
Total Protein: 7.5 g/dL (ref 6.5–8.1)

## 2019-11-17 MED ORDER — SODIUM CHLORIDE 0.9 % IV SOLN
10.0000 mg | Freq: Once | INTRAVENOUS | Status: AC
  Administered 2019-11-17: 10 mg via INTRAVENOUS
  Filled 2019-11-17: qty 10

## 2019-11-17 MED ORDER — PALONOSETRON HCL INJECTION 0.25 MG/5ML
0.2500 mg | Freq: Once | INTRAVENOUS | Status: AC
  Administered 2019-11-17: 0.25 mg via INTRAVENOUS
  Filled 2019-11-17: qty 5

## 2019-11-17 MED ORDER — DEXTROSE 5 % IV SOLN
Freq: Once | INTRAVENOUS | Status: AC
  Filled 2019-11-17: qty 250

## 2019-11-17 MED ORDER — HEPARIN SOD (PORK) LOCK FLUSH 100 UNIT/ML IV SOLN
500.0000 [IU] | Freq: Once | INTRAVENOUS | Status: DC
  Filled 2019-11-17: qty 5

## 2019-11-17 MED ORDER — OXALIPLATIN CHEMO INJECTION 100 MG/20ML
83.0000 mg/m2 | Freq: Once | INTRAVENOUS | Status: AC
  Administered 2019-11-17: 185 mg via INTRAVENOUS
  Filled 2019-11-17: qty 37

## 2019-11-17 MED ORDER — FLUOROURACIL CHEMO INJECTION 2.5 GM/50ML
400.0000 mg/m2 | Freq: Once | INTRAVENOUS | Status: AC
  Administered 2019-11-17: 900 mg via INTRAVENOUS
  Filled 2019-11-17: qty 18

## 2019-11-17 MED ORDER — SODIUM CHLORIDE 0.9% FLUSH
10.0000 mL | Freq: Once | INTRAVENOUS | Status: AC
  Administered 2019-11-17: 10 mL via INTRAVENOUS
  Filled 2019-11-17: qty 10

## 2019-11-17 MED ORDER — SODIUM CHLORIDE 0.9 % IV SOLN
2400.0000 mg/m2 | INTRAVENOUS | Status: DC
  Administered 2019-11-17: 5350 mg via INTRAVENOUS
  Filled 2019-11-17: qty 107

## 2019-11-17 MED ORDER — LEUCOVORIN CALCIUM INJECTION 350 MG
403.6000 mg/m2 | Freq: Once | INTRAVENOUS | Status: AC
  Administered 2019-11-17: 900 mg via INTRAVENOUS
  Filled 2019-11-17: qty 25

## 2019-11-17 NOTE — Progress Notes (Signed)
.  Hematology/Oncology follow up  note [facility] [phone] [phone]   Patient Care Team: Albina Billet, MD as PCP - General (Internal Medicine) Clent Jacks, RN as Oncology Nurse Navigator Earlie Server, MD as Consulting Physician (Oncology)  REFERRING PROVIDER: Albina Billet, MD  CHIEF COMPLAINTS/REASON FOR VISIT:  Follow-up for colorectal cancer  HISTORY OF PRESENTING ILLNESS:   Garrett Peterson is a  74 y.o.  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 Alice Reichert for follow-up on history of colon polyps.  Patient also has reported recent history of change of bowel habits.  He has felt more constipated. No blood in the stool, abdominal pain, unintentional weight loss or fever or chills. Colonoscopy records is at [facility] clinic and is not available to me. 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  MMR is deferred to resection specimen.  # 09/20/2019, patient underwent robotic assisted laparoscopic low anterior resection. Pathology report was reviewed by me and discussed with patient and his wife. I also called pathology and discussed with Dr. Reuel Derby. The tumor is located 1.5 cm distal to the rectosigmoid junction.  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  I explained to patient the different final diagnosis of cancer of rectum rather than endoscopic impression of distal sigmoid colon cancer.  Tumor was not visualized on preop CT scan.  -pT3 pN1 M0, grade 2 adenocarcinoma. MMR status is intact  All margins were uninvolved by invasive carcinoma, high-grade dysplasia/intramucosal adenocarcinoma and low-grade dysplastic.  1 out of 8 regional lymph nodes were positive for malignancy. Patient has lymphovascular  invasion as well as perineural invasion. I discussed with patient that ideally his rectal cancer would be better treated with total neoadjuvant treatment prior to the surgery if we would know his final diagnosis will be an upper rectal cancer.  Preoperative treatments is associated with a more favorable long-term toxicity profile and fever local recurrence then postoperative therapy.  However overall survival appears to be similar.In this case, I would recommend chemotherapy and adjuvant chemo- radiation  # 10/13/2019 patient had CT abdomen pelvis with contrast for evaluation of concern of peritonitis and bowel perforation.  Pneumoperitoneum was seen on earlier radiograph.  There was moderate pneumoperitoneum, extensive colonic diverticulosis.  5.2 x 4.1 cm loculated fluid collection in the left lower abdomen concerning for an infected collection or abscess. Patient had a drain placed and finished a course of antibiotics.  Draining catheter was pulled.  #11/02/2019, patient started adjuvant FOLFOX treatments.  INTERVAL HISTORY Garrett Peterson is a 74 y.o. male who has above history reviewed by me today presents for follow up visit for management of colorectal cancer. Problems and complaints are listed below: Patient status post cycle 1 FOLFOX. Constipation, patient takes Senokot and MiraLAX and constipation has resolved.   He is able to have normal bowel movements without need of taking any laxatives. Patient denies nausea, vomiting, fever or chills.  Review of Systems  Constitutional: Negative for appetite change, chills, fatigue, fever and unexpected weight change.  HENT:   Negative for hearing loss and voice change.   Eyes: Negative for eye problems and icterus.  Respiratory: Negative for chest tightness, cough and shortness of breath.   Cardiovascular: Negative for chest pain and leg swelling.  Gastrointestinal:  Negative for abdominal distention, abdominal pain and blood in stool.  Endocrine:  Negative for hot flashes.  Genitourinary: Negative for difficulty urinating, dysuria and frequency.   Musculoskeletal: Negative for arthralgias.  Skin: Negative for itching and rash.  Neurological: Negative for extremity weakness, light-headedness and numbness.  Hematological: Negative for adenopathy. Does not bruise/bleed easily.  Psychiatric/Behavioral: Negative for confusion.    MEDICAL HISTORY:  Past Medical History:  Diagnosis Date  . Arthritis   . Basal cell carcinoma   . CAD (coronary artery disease)   . Cancer ([place]) 2021   rectal  . Hyperlipidemia   . Pre-diabetes     SURGICAL HISTORY: Past Surgical History:  Procedure Laterality Date  . APPENDECTOMY    . CARDIAC CATHETERIZATION    . COLONOSCOPY    . CORONARY ARTERY BYPASS GRAFT    . IR SINUS/FIST TUBE CHK-NON GI  10/27/2019  . open heart surgery    . PORTACATH PLACEMENT Right 10/13/2019   Procedure: INSERTION PORT-A-CATH;  Surgeon: Herbert Pun, MD;  Location: [facility] ORS;  Service: General;  Laterality: Right;    SOCIAL HISTORY: Social History   Socioeconomic History  . Marital status: Married    Spouse name: Not on file  . Number of children: Not on file  . Years of education: Not on file  . Highest education level: Not on file  Occupational History  . Not on file  Tobacco Use  . Smoking status: Never Smoker  . Smokeless tobacco: Never Used  Vaping Use  . Vaping Use: Never used  Substance and Sexual Activity  . Alcohol use: Not Currently    Alcohol/week: 0.0 standard drinks  . Drug use: Never  . Sexual activity: Not Currently  Other Topics Concern  . Not on file  Social History Narrative  . Not on file   Social Determinants of Health   Financial Resource Strain:   . Difficulty of Paying Living Expenses:   Food Insecurity:   . Worried About Charity fundraiser in the Last Year:   . Arboriculturist in the Last Year:   Transportation Needs:   . Film/video editor (Medical):   [name] Kitchen Lack  of Transportation (Non-Medical):   Physical Activity:   . Days of Exercise per Week:   . Minutes of Exercise per Session:   Stress:   . Feeling of Stress :   Social Connections:   . Frequency of Communication with Friends and Family:   . Frequency of Social Gatherings with Friends and Family:   . Attends Religious Services:   . Active Member of Clubs or Organizations:   . Attends Archivist Meetings:   [name] Kitchen Marital Status:   Intimate Partner Violence:   . Fear of Current or Ex-Partner:   . Emotionally Abused:   [name] Kitchen Physically Abused:   . Sexually Abused:     FAMILY HISTORY: Family History  Problem Relation Age of Onset  . Lung disease Father     ALLERGIES:  has No Known Allergies.  MEDICATIONS:  Current Outpatient Medications  Medication Sig Dispense Refill  . atorvastatin (LIPITOR) 20 MG tablet Take 20 mg by mouth every evening.     . lidocaine-prilocaine (EMLA) cream Apply to affected area once 30 g 3  . metoprolol tartrate (LOPRESSOR) 25 MG tablet Take 25 mg by mouth 2 (two) times daily.    . polyethylene glycol (MIRALAX) 17 g packet Take 17 g by mouth daily as needed for severe constipation. 20 each 1  .  prochlorperazine (COMPAZINE) 10 MG tablet Take 1 tablet (10 mg total) by mouth every 6 (six) hours as needed (Nausea or vomiting). 30 tablet 1  . senna (SENOKOT) 8.6 MG TABS tablet Take 2 tablets (17.2 mg total) by mouth daily. 120 tablet 1  . temazepam (RESTORIL) 30 MG capsule Take 30 mg by mouth at bedtime.      No current facility-administered medications for this visit.   Facility-Administered Medications Ordered in Other Visits  Medication Dose Route Frequency Provider Last Rate Last Admin  . heparin lock flush 100 unit/mL  500 Units Intravenous Once Earlie Server, MD         PHYSICAL EXAMINATION: ECOG PERFORMANCE STATUS: 0 - Asymptomatic Vitals:   11/17/19 0904  BP: 131/71  Pulse: (!) 52  Resp: 18  Temp: (!) 97.2 F (36.2 C)   Filed Weights    11/17/19 0904  Weight: 211 lb 12.8 oz (96.1 kg)    Physical Exam Constitutional:      General: He is not in acute distress. HENT:     Head: Normocephalic and atraumatic.  Eyes:     General: No scleral icterus. Cardiovascular:     Rate and Rhythm: Normal rate and regular rhythm.     Heart sounds: Normal heart sounds.  Pulmonary:     Effort: Pulmonary effort is normal. No respiratory distress.     Breath sounds: No wheezing.  Abdominal:     General: Bowel sounds are normal. There is no distension.     Palpations: Abdomen is soft.  Musculoskeletal:        General: No deformity. Normal range of motion.     Cervical back: Normal range of motion and neck supple.  Skin:    General: Skin is warm and dry.     Findings: No erythema or rash.     Comments: Right anterior chest wall + Mediport  Neurological:     Mental Status: He is alert and oriented to person, place, and time. Mental status is at baseline.     Cranial Nerves: No cranial nerve deficit.     Coordination: Coordination normal.  Psychiatric:        Mood and Affect: Mood normal.     LABORATORY DATA:  I have reviewed the data as listed Lab Results  Component Value Date   WBC 8.0 11/09/2019   HGB 12.8 (L) 11/09/2019   HCT 36.8 (L) 11/09/2019   MCV 92.5 11/09/2019   PLT 147 (L) 11/09/2019   Recent Labs    09/06/19 1200 09/06/19 1200 09/21/19 0626 09/21/19 0626 10/27/19 0821 11/02/19 0800 11/09/19 1009  NA 139   < > 139  --   --  140 138  K 4.1   < > 3.9  --   --  3.7 4.1  CL 103   < > 108  --   --  105 104  CO2 26   < > 24  --   --  27 24  GLUCOSE 121*   < > 116*  --   --  129* 117*  BUN 12   < > 12  --   --  11 13  CREATININE 1.10   < > 1.17   < > 0.90 0.83 0.96  CALCIUM 9.2   < > 8.3*  --   --  8.9 9.1  GFRNONAA >60   < > >60  --   --  >60 >60  GFRAA >60   < > >60  --   --  >  60 >60  PROT 8.2*  --   --   --   --  7.5 8.0  ALBUMIN 4.4  --   --   --   --  3.8 4.2  AST 23  --   --   --   --  23 27  ALT  26  --   --   --   --  22 26  ALKPHOS 73  --   --   --   --  91 90  BILITOT 0.9  --   --   --   --  0.5 0.8   < > = values in this interval not displayed.   Iron/TIBC/Ferritin/ %Sat    Component Value Date/Time   IRON 100 09/06/2019 1200   TIBC 368 09/06/2019 1200   FERRITIN 198 09/06/2019 1200   IRONPCTSAT 27 09/06/2019 1200      RADIOGRAPHIC STUDIES: I have personally reviewed the radiological images as listed and agreed with the findings in the report. CT ABDOMEN PELVIS W CONTRAST  Result Date: 10/27/2019 CLINICAL DATA:  History of low anterior resection to remove a rectosigmoid carcinoma with contained anastomotic leak ultimately requiring CT-guided placement of a percutaneous drainage catheter placement on 10/18/2019 Patient presents today for abdominal CT and potential drainage catheter injection. Patient reports little to no output from the percutaneous catheter for the past several days. EXAM: CT ABDOMEN AND PELVIS WITH CONTRAST TECHNIQUE: Multidetector CT imaging of the abdomen and pelvis was performed using the standard protocol following bolus administration of intravenous contrast. CONTRAST:  123m OMNIPAQUE IOHEXOL 300 MG/ML  SOLN COMPARISON:  CT abdomen and pelvis-10/13/2019; CT-guided left lower abdominal/pelvic percutaneous drainage catheter placement-10/18/2019 FINDINGS: Lower chest: Limited visualization of the lower thorax redemonstrates minimal bibasilar reticular and ground-glass opacities with subpleural sparing. No discrete focal airspace opacities. No pleural effusion. Cardiomegaly. Post median sternotomy. Calcifications within native coronary arteries. No pericardial effusion. Hepatobiliary: Mild nodularity hepatic contour. There is a minimal amount of focal fatty infiltration adjacent to the fissure for the ligamentum teres. No discrete hepatic lesions. There is a punctate granuloma within the dome of the right lobe of the liver. Post cholecystectomy. No intra  extrahepatic bili duct dilatation. No ascites. Pancreas: Normal appearance of the pancreas. Spleen: Normal appearance of the spleen. Adrenals/Urinary Tract: There is symmetric enhancement and excretion of the bilateral kidneys. There is a punctate (2 mm) nonobstructing stone within the inferior pole the left kidney (image 50, series 6). No evidence of right-sided nephrolithiasis on this postcontrast examination. Note is made of an approximately 8.4 cm hypoattenuating nonenhancing exophytic cyst arising from the anterior superior pole of the left kidney (image 36, series 3). There is an approximately 0.8 cm hyperattenuating nonenhancing partially exophytic lesion arising from the inferior pole the left kidney (axial image 51, series 3; image 31, series 8; coronal image 49, series 6), which is too small to accurately characterize though may represent a hyperdense renal cyst. No discrete right-sided renal lesions. There is a minimal amount of likely age and body habitus related bilateral symmetric perinephric stranding. No evidence of urinary obstruction. Normal appearance the bilateral adrenal glands. Normal appearance of the urinary bladder given degree of distention. Stomach/Bowel: Stable positioning of left lower abdominal/pelvic percutaneous drainage catheter with resolution of previously noted pericolonic abscess. There is a minimal amount of ill-defined stranding about the descending colon with minimal amount of free fluid in the pelvic cul-de-sac but without definable/drainable fluid collection within the abdomen or pelvis. Interval resolution of postoperative  pneumoperitoneum. Stable primary colonic anastomosis within the distal sigmoid/rectum. Large colonic stool burden without evidence of enteric obstruction. Normal appearance of the terminal ileum and appendix. No discrete areas of bowel wall thickening. No pneumoperitoneum, pneumatosis or portal venous gas. Vascular/Lymphatic: Moderate to large amount of  mixed calcified and noncalcified atherosclerotic plaque within normal caliber abdominal aorta. The major branch vessels of the abdominal aorta appear patent on this non CTA examination. No bulky retroperitoneal, mesenteric, pelvic or inguinal lymphadenopathy. Reproductive: Dystrophic calcifications within normal sized prostate gland. There is a small amount of likely reactive free fluid in the pelvic cul-de-sac. Other: Interval resolution of previously noted left lateral abdominal wall subcutaneous emphysema. Small bilateral mesenteric fat containing inguinal hernias. Linear areas of subcutaneous stranding involving the ventral aspect of the abdominal wall likely represent laparotomy ports (axial images 24, 27, 32 and 46). No associated hernia. There is a minimal amount of subcutaneous edema about the midline of the low back. Musculoskeletal: No acute or aggressive osseous abnormalities. 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 Moderate bilateral facet degenerative change of the lower lumbar spine. Moderate degenerative change the bilateral hips with joint space loss, subchondral sclerosis and osteophytosis. IMPRESSION: 1. Complete resolution of left lower abdominal/pelvic abscess following percutaneous drainage catheter placement. 2. There is a minimal amount of mesenteric stranding about the descending colon and ill-defined free fluid in the pelvic cul-de-sac without new definable/drainable fluid collection within the abdomen or pelvis. 3. Interval resolution of postoperative pneumoperitoneum and body wall subcutaneous emphysema. 4. Stable sequela of distal colonic primary anastomosis without evidence of enteric obstruction. 5. Extensive colonic diverticulosis without evidence superimposed acute diverticulitis. 6. Large colonic stool burden. 7. Punctate (2 mm) nonobstructing left-sided renal stone.  8. Mild nodularity of the hepatic contour as could be seen in the setting of early cirrhotic change. Correlation with LFTs is advised. 9.  Aortic Atherosclerosis (ICD10-I70.0). PLAN: Patient subsequently underwent percutaneous drainage catheter injection. Electronically Signed   By: Sandi Mariscal M.D.   On: 10/27/2019 11:06   IR Sinus/Fist Tube Chk-Non GI  Result Date: 10/27/2019 CLINICAL DATA:  History of low anterior resection for rectosigmoid carcinoma with contained anastomotic leak, ultimately requiring CT-guided placement of percutaneous catheter on 10/18/2019 Kathlene Cote). CT scan performed earlier today demonstrates complete resolution of the pericolonic abscess. As such patient now presents for drainage catheter injection. Patient reports little to no output from the percutaneous drainage catheter for the past several days. EXAM: SINUS TRACT INJECTION/FISTULOGRAM COMPARISON:  CT abdomen and pelvis-earlier same day; 10/13/2019; CT-guided percutaneous catheter placement-10/18/2019 CONTRAST:  15 cc Omnipaque 300 - administered via the existing percutaneous drain. FLUOROSCOPY TIME:  42 seconds (14 mGy) TECHNIQUE: The patient was positioned supine on the fluoroscopy table. A preprocedural spot fluoroscopic image was obtained of the left lower abdomen/pelvis and the existing percutaneous drainage catheter. Multiple spot fluoroscopic and radiographic images were obtained following the injection of a small amount of contrast via the existing percutaneous drainage catheter. Images reviewed and discussed with the patient. The decision was made to remove the percutaneous drainage catheter. As such, the external portion of the drainage catheter was cut and drainage catheters removed intact. A dressing was applied. The patient tolerated the procedure well without immediate postprocedural complication. FINDINGS: Preprocedural spot fluoroscopic image demonstrates a percutaneous drainage catheter overlying the left lower  abdomen/pelvis. Excreted contrast from recent abdominal CT is seen within the urinary bladder. Contrast injection demonstrates opacification  of decompressed abscess cavity with reflux of contrast along the catheter tract. There is no definitive communication between the decompressed abscess cavity and the adjacent intestines as confirmed following aspiration of the entirety of the injected enteric contrast. IMPRESSION: Drain injection is negative for the presence of an enteric fistula. Given lack of output from the drainage catheter for the past several days, resolution of the pericolonic abscess on preceding abdominal CT and injection being negative for the presence of an enteric fistula, the drainage catheter was removed at the patient's bedside without incident. PLAN: Patient instructed to maintain all follow-up appointments with his referring surgeon Windell Moment) as well as oncologist (Dr. Tasia Catchings), and may otherwise follow-up at the interventional radiology drain clinic a p.r.n. basis. Electronically Signed   By: Sandi Mariscal M.D.   On: 10/27/2019 12:11   CT IMAGE GUIDED DRAINAGE BY PERCUTANEOUS CATHETER  Result Date: 10/18/2019 CLINICAL DATA:  Status post low anterior resection to remove rectosigmoid carcinoma. Contained anastomotic leak has been a covered by imaging with development a left lower quadrant pelvic fluid collection/abscess. The patient presents for drainage of the fluid collection. EXAM: CT GUIDED CATHETER DRAINAGE OF PERITONEAL ABSCESS ANESTHESIA/SEDATION: 2.0 mg IV Versed 100 mcg IV Fentanyl Total Moderate Sedation Time:  20 minutes The patient's level of consciousness and physiologic status were continuously monitored during the procedure by Radiology nursing. PROCEDURE: The procedure, risks, benefits, and alternatives were explained to the patient. Questions regarding the procedure were encouraged and answered. The patient understands and consents to the procedure. A time out was performed  prior to initiating the procedure. The left lateral abdominal wall was prepped with chlorhexidine in a sterile fashion, and a sterile drape was applied covering the operative field. A sterile gown and sterile gloves were used for the procedure. Local anesthesia was provided with 1% Lidocaine. CT was performed initially in a supine position followed by a slightly oblique position with the left side raised. From this position, an 18 gauge trocar needle was advanced from an anterolateral approach to the level of the left lower quadrant fluid collection. After confirming needle tip position, fluid was aspirated from the needle. A guidewire was advanced into the collection and the needle removed. The percutaneous tract was dilated and a 12 French percutaneous drainage catheter advanced over the guidewire. Catheter position was confirmed by CT. Additional fluid was aspirated from the catheter and a fluid sample sent for culture analysis. The drainage catheter was flushed with sterile saline and attached to a suction bulb. The drain was secured at the skin with a Prolene retention suture and StatLock device. COMPLICATIONS: None FINDINGS: Very little anterior window was present to the level of the left-sided upper pelvic peritoneal fluid collection which by CT today measures approximately 4.3 x 5.3 cm. In a slightly oblique position with the left side raise, there was a much improved anterolateral window allowing needle advancement followed by drain placement. There was return of thick, tan colored fluid. A sample was sent for culture analysis. After drain placement, there is good return of fluid with suction bulb drainage. IMPRESSION: CT-guided percutaneous catheter drainage of peritoneal abscess in the left upper pelvis with return of thick, tan fluid. A sample was sent for culture analysis. A 12 French drainage catheter was placed and attached to suction bulb drainage. Electronically Signed   By: Aletta Edouard M.D.    On: 10/18/2019 13:33      ASSESSMENT & PLAN:  1. Rectal cancer ([place])   2. Encounter for antineoplastic chemotherapy  3. Thrombocytopenia ([facility])    #Rectal cancer stage III Status post cycle 1 FOLFOX. Overall he tolerates well with mild difficulties. Labs are reviewed and discussed with patient. Counts acceptable to proceed with cycle 2 FOLFOX    #Weight loss, he gained 3 pounds since last visit.  Continue monitor. #thrombocytopenia, platelet count is 105, 000, continue to monitor.  #Chemotherapy induced neutropenia, ANC 1.5, continue chemotherapy treatments.  Repeat CBC in 1 week  Constipation, symptom has resolved.  Senokot/MiraLAX as needed  #Elevated blood pressure reading.  Blood pressure is normal today.  -Renal cyst, patient will need MRI abdomen with and without contrast for further evaluation renal cyst in the future.  Will obtain prior to radiation.  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  Discussed with the patient and encourage him to further discuss with primary care provider for the need of see pulmonology All questions were answered. The patient knows to call the clinic with any problems questions or concerns.  Return of visit: 2 weeks  Earlie Server, MD, PhD Hematology Oncology [facility] Pager- [phone] 11/17/2019

## 2019-11-17 NOTE — Progress Notes (Signed)
Pt here for follow up. No new concerns voiced.

## 2019-11-18 LAB — CEA: CEA: 8.5 ng/mL — ABNORMAL HIGH (ref 0.0–4.7)

## 2019-11-19 ENCOUNTER — Inpatient Hospital Stay: Payer: Medicare Other

## 2019-11-19 ENCOUNTER — Other Ambulatory Visit: Payer: Self-pay

## 2019-11-19 VITALS — BP 146/78 | HR 70 | Temp 97.8°F | Resp 16

## 2019-11-19 DIAGNOSIS — C20 Malignant neoplasm of rectum: Secondary | ICD-10-CM

## 2019-11-19 DIAGNOSIS — Z5111 Encounter for antineoplastic chemotherapy: Secondary | ICD-10-CM | POA: Diagnosis not present

## 2019-11-19 MED ORDER — HEPARIN SOD (PORK) LOCK FLUSH 100 UNIT/ML IV SOLN
INTRAVENOUS | Status: AC
  Filled 2019-11-19: qty 5

## 2019-11-19 MED ORDER — HEPARIN SOD (PORK) LOCK FLUSH 100 UNIT/ML IV SOLN
500.0000 [IU] | Freq: Once | INTRAVENOUS | Status: AC | PRN
  Administered 2019-11-19: 500 [IU]
  Filled 2019-11-19: qty 5

## 2019-11-19 MED ORDER — SODIUM CHLORIDE 0.9% FLUSH
10.0000 mL | INTRAVENOUS | Status: DC | PRN
  Administered 2019-11-19: 10 mL
  Filled 2019-11-19: qty 10

## 2019-11-24 ENCOUNTER — Other Ambulatory Visit: Payer: Self-pay

## 2019-11-24 ENCOUNTER — Inpatient Hospital Stay: Payer: Medicare Other

## 2019-11-24 DIAGNOSIS — C20 Malignant neoplasm of rectum: Secondary | ICD-10-CM

## 2019-11-24 DIAGNOSIS — Z5111 Encounter for antineoplastic chemotherapy: Secondary | ICD-10-CM | POA: Diagnosis not present

## 2019-11-24 DIAGNOSIS — D696 Thrombocytopenia, unspecified: Secondary | ICD-10-CM

## 2019-11-24 LAB — CBC WITH DIFFERENTIAL/PLATELET
Abs Immature Granulocytes: 0.01 10*3/uL (ref 0.00–0.07)
Basophils Absolute: 0.1 10*3/uL (ref 0.0–0.1)
Basophils Relative: 1 %
Eosinophils Absolute: 0.5 10*3/uL (ref 0.0–0.5)
Eosinophils Relative: 9 %
HCT: 36.3 % — ABNORMAL LOW (ref 39.0–52.0)
Hemoglobin: 12.6 g/dL — ABNORMAL LOW (ref 13.0–17.0)
Immature Granulocytes: 0 %
Lymphocytes Relative: 56 %
Lymphs Abs: 3.2 10*3/uL (ref 0.7–4.0)
MCH: 31.8 pg (ref 26.0–34.0)
MCHC: 34.7 g/dL (ref 30.0–36.0)
MCV: 91.7 fL (ref 80.0–100.0)
Monocytes Absolute: 0.5 10*3/uL (ref 0.1–1.0)
Monocytes Relative: 10 %
Neutro Abs: 1.3 10*3/uL — ABNORMAL LOW (ref 1.7–7.7)
Neutrophils Relative %: 24 %
Platelets: 109 10*3/uL — ABNORMAL LOW (ref 150–400)
RBC: 3.96 MIL/uL — ABNORMAL LOW (ref 4.22–5.81)
RDW: 14.4 % (ref 11.5–15.5)
WBC: 5.6 10*3/uL (ref 4.0–10.5)
nRBC: 0 % (ref 0.0–0.2)

## 2019-12-01 ENCOUNTER — Inpatient Hospital Stay: Payer: Medicare Other

## 2019-12-01 ENCOUNTER — Inpatient Hospital Stay (HOSPITAL_BASED_OUTPATIENT_CLINIC_OR_DEPARTMENT_OTHER): Payer: Medicare Other | Admitting: Oncology

## 2019-12-01 ENCOUNTER — Encounter: Payer: Self-pay | Admitting: Oncology

## 2019-12-01 ENCOUNTER — Other Ambulatory Visit: Payer: Self-pay

## 2019-12-01 VITALS — BP 167/83 | HR 58 | Temp 98.3°F | Resp 18 | Wt 213.8 lb

## 2019-12-01 DIAGNOSIS — D696 Thrombocytopenia, unspecified: Secondary | ICD-10-CM | POA: Diagnosis not present

## 2019-12-01 DIAGNOSIS — N281 Cyst of kidney, acquired: Secondary | ICD-10-CM

## 2019-12-01 DIAGNOSIS — C20 Malignant neoplasm of rectum: Secondary | ICD-10-CM

## 2019-12-01 DIAGNOSIS — Z5111 Encounter for antineoplastic chemotherapy: Secondary | ICD-10-CM

## 2019-12-01 LAB — CBC WITH DIFFERENTIAL/PLATELET
Abs Immature Granulocytes: 0 10*3/uL (ref 0.00–0.07)
Basophils Absolute: 0.1 10*3/uL (ref 0.0–0.1)
Basophils Relative: 1 %
Eosinophils Absolute: 0.2 10*3/uL (ref 0.0–0.5)
Eosinophils Relative: 5 %
HCT: 36.9 % — ABNORMAL LOW (ref 39.0–52.0)
Hemoglobin: 13.1 g/dL (ref 13.0–17.0)
Immature Granulocytes: 0 %
Lymphocytes Relative: 53 %
Lymphs Abs: 2.5 10*3/uL (ref 0.7–4.0)
MCH: 32.6 pg (ref 26.0–34.0)
MCHC: 35.5 g/dL (ref 30.0–36.0)
MCV: 91.8 fL (ref 80.0–100.0)
Monocytes Absolute: 0.8 10*3/uL (ref 0.1–1.0)
Monocytes Relative: 17 %
Neutro Abs: 1.1 10*3/uL — ABNORMAL LOW (ref 1.7–7.7)
Neutrophils Relative %: 24 %
Platelets: 78 10*3/uL — ABNORMAL LOW (ref 150–400)
RBC: 4.02 MIL/uL — ABNORMAL LOW (ref 4.22–5.81)
RDW: 15.5 % (ref 11.5–15.5)
WBC: 4.7 10*3/uL (ref 4.0–10.5)
nRBC: 0 % (ref 0.0–0.2)

## 2019-12-01 LAB — COMPREHENSIVE METABOLIC PANEL
ALT: 52 U/L — ABNORMAL HIGH (ref 0–44)
AST: 38 U/L (ref 15–41)
Albumin: 3.9 g/dL (ref 3.5–5.0)
Alkaline Phosphatase: 96 U/L (ref 38–126)
Anion gap: 8 (ref 5–15)
BUN: 9 mg/dL (ref 8–23)
CO2: 25 mmol/L (ref 22–32)
Calcium: 8.9 mg/dL (ref 8.9–10.3)
Chloride: 107 mmol/L (ref 98–111)
Creatinine, Ser: 0.93 mg/dL (ref 0.61–1.24)
GFR calc Af Amer: >60 mL/min (ref >60)
GFR calc non Af Amer: >60 mL/min (ref >60)
Glucose, Bld: 113 mg/dL — ABNORMAL HIGH (ref 70–99)
Potassium: 3.5 mmol/L (ref 3.5–5.1)
Sodium: 140 mmol/L (ref 135–145)
Total Bilirubin: 0.5 mg/dL (ref 0.3–1.2)
Total Protein: 7.3 g/dL (ref 6.5–8.1)

## 2019-12-01 MED ORDER — SODIUM CHLORIDE 0.9 % IV SOLN
10.0000 mg | Freq: Once | INTRAVENOUS | Status: AC
  Administered 2019-12-01: 10 mg via INTRAVENOUS
  Filled 2019-12-01: qty 10

## 2019-12-01 MED ORDER — LEUCOVORIN CALCIUM INJECTION 350 MG
403.5000 mg/m2 | Freq: Once | INTRAVENOUS | Status: AC
  Administered 2019-12-01: 900 mg via INTRAVENOUS
  Filled 2019-12-01: qty 25

## 2019-12-01 MED ORDER — SODIUM CHLORIDE 0.9% FLUSH
10.0000 mL | Freq: Once | INTRAVENOUS | Status: AC
  Administered 2019-12-01: 10 mL via INTRAVENOUS
  Filled 2019-12-01: qty 10

## 2019-12-01 MED ORDER — HEPARIN SOD (PORK) LOCK FLUSH 100 UNIT/ML IV SOLN
500.0000 [IU] | Freq: Once | INTRAVENOUS | Status: DC
  Filled 2019-12-01: qty 5

## 2019-12-01 MED ORDER — SODIUM CHLORIDE 0.9 % IV SOLN
2400.0000 mg/m2 | INTRAVENOUS | Status: DC
  Administered 2019-12-01: 5350 mg via INTRAVENOUS
  Filled 2019-12-01: qty 107

## 2019-12-01 MED ORDER — DEXTROSE 5 % IV SOLN
Freq: Once | INTRAVENOUS | Status: AC
  Filled 2019-12-01: qty 250

## 2019-12-01 MED ORDER — OXALIPLATIN CHEMO INJECTION 100 MG/20ML
83.0000 mg/m2 | Freq: Once | INTRAVENOUS | Status: AC
  Administered 2019-12-01: 185 mg via INTRAVENOUS
  Filled 2019-12-01: qty 37

## 2019-12-01 MED ORDER — PALONOSETRON HCL INJECTION 0.25 MG/5ML
0.2500 mg | Freq: Once | INTRAVENOUS | Status: AC
  Administered 2019-12-01: 0.25 mg via INTRAVENOUS
  Filled 2019-12-01: qty 5

## 2019-12-01 NOTE — Progress Notes (Signed)
Per Dr. Tasia Catchings patient can receive treatment today with an platelet count of 76,000.

## 2019-12-01 NOTE — Progress Notes (Signed)
ANC 1.1, MD ok with all labs today.  D/c 56f bolus

## 2019-12-01 NOTE — Progress Notes (Signed)
Patient here for follow up. Reports he started walking every morning. Blood pressure elevated, but he reports he has not taken bp meds this morning.

## 2019-12-01 NOTE — Progress Notes (Signed)
.  Hematology/Oncology follow up  note [id number] [facility] Telephone:([phone] Fax:([phone]   Patient Care Team: Garrett Billet, MD as PCP - General (Internal Medicine) Garrett Jacks, RN as Oncology Nurse Navigator Garrett Server, MD as Consulting Physician (Oncology)  REFERRING PROVIDER: Albina Billet, MD  CHIEF COMPLAINTS/REASON FOR VISIT:  Follow-up for colorectal cancer  HISTORY OF PRESENTING ILLNESS:   Garrett Peterson is a  74 y.o.  male with PMH listed below was seen in consultation at the request of  Garrett Billet, MD  for evaluation of sigmoid cancer  Patient recently had colonoscopy done by Dr. Alice Reichert for follow-up on history of colon polyps.  Patient also has reported recent history of change of bowel habits.  He has felt more constipated. No blood in the stool, abdominal pain, unintentional weight loss or fever or chills. Colonoscopy records is at [facility] clinic and is not available to me. 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  MMR is deferred to resection specimen.  # 09/20/2019, patient underwent robotic assisted laparoscopic low anterior resection. Pathology report was reviewed by me and discussed with patient and his wife. I also called pathology and discussed with Dr. Reuel Derby. The tumor is located 1.5 cm distal to the rectosigmoid junction.  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  I explained to patient the different final diagnosis of cancer of rectum rather than endoscopic impression of distal sigmoid colon cancer.  Tumor was not visualized on preop CT scan.  -pT3 pN1 M0, grade 2 adenocarcinoma. MMR status is intact  All margins were uninvolved by invasive carcinoma, high-grade dysplasia/intramucosal adenocarcinoma and low-grade dysplastic.  1 out of 8 regional lymph nodes were positive for malignancy. Patient has lymphovascular  invasion as well as perineural invasion. I discussed with patient that ideally his rectal cancer would be better treated with total neoadjuvant treatment prior to the surgery if we would know his final diagnosis will be an upper rectal cancer.  Preoperative treatments is associated with a more favorable long-term toxicity profile and fever local recurrence then postoperative therapy.  However overall survival appears to be similar.In this case, I would recommend chemotherapy and adjuvant chemo- radiation  # 10/13/2019 patient had CT abdomen pelvis with contrast for evaluation of concern of peritonitis and bowel perforation.  Pneumoperitoneum was seen on earlier radiograph.  There was moderate pneumoperitoneum, extensive colonic diverticulosis.  5.2 x 4.1 cm loculated fluid collection in the left lower abdomen concerning for an infected collection or abscess. Patient had a drain placed and finished a course of antibiotics.  Draining catheter was pulled.  #11/02/2019, patient started adjuvant FOLFOX treatments.  INTERVAL HISTORY Garrett Peterson is a 74 y.o. male who has above history reviewed by me today presents for follow up visit for management of colorectal cancer. Problems and complaints are listed below: Patient is on neoadjuvant FOLFOX.  Overall he tolerates well. Constipation has improved. Denies any nausea, vomiting, fever or chills.  Denies any bleeding events. . Review of Systems  Constitutional: Negative for appetite change, chills, fatigue, fever and unexpected weight change.  HENT:   Negative for hearing loss and voice change.   Eyes: Negative for eye problems and icterus.  Respiratory: Negative for chest tightness, cough and shortness of breath.   Cardiovascular: Negative for chest pain and leg swelling.  Gastrointestinal: Negative for abdominal distention, abdominal pain and blood in stool.  Endocrine: Negative for  hot flashes.  Genitourinary: Negative for difficulty urinating,  dysuria and frequency.   Musculoskeletal: Negative for arthralgias.  Skin: Negative for itching and rash.  Neurological: Negative for extremity weakness, light-headedness and numbness.  Hematological: Negative for adenopathy. Does not bruise/bleed easily.  Psychiatric/Behavioral: Negative for confusion.    MEDICAL HISTORY:  Past Medical History:  Diagnosis Date  . Arthritis   . Basal cell carcinoma   . CAD (coronary artery disease)   . Cancer ([place]) 2021   rectal  . Hyperlipidemia   . Pre-diabetes     SURGICAL HISTORY: Past Surgical History:  Procedure Laterality Date  . APPENDECTOMY    . CARDIAC CATHETERIZATION    . COLONOSCOPY    . CORONARY ARTERY BYPASS GRAFT    . IR SINUS/FIST TUBE CHK-NON GI  10/27/2019  . open heart surgery    . PORTACATH PLACEMENT Right 10/13/2019   Procedure: INSERTION PORT-A-CATH;  Surgeon: Herbert Pun, MD;  Location: [facility] ORS;  Service: General;  Laterality: Right;    SOCIAL HISTORY: Social History   Socioeconomic History  . Marital status: Married    Spouse name: Not on file  . Number of children: Not on file  . Years of education: Not on file  . Highest education level: Not on file  Occupational History  . Not on file  Tobacco Use  . Smoking status: Never Smoker  . Smokeless tobacco: Never Used  Vaping Use  . Vaping Use: Never used  Substance and Sexual Activity  . Alcohol use: Not Currently    Alcohol/week: 0.0 standard drinks  . Drug use: Never  . Sexual activity: Not Currently  Other Topics Concern  . Not on file  Social History Narrative  . Not on file   Social Determinants of Health   Financial Resource Strain:   . Difficulty of Paying Living Expenses:   Food Insecurity:   . Worried About Charity fundraiser in the Last Year:   . Arboriculturist in the Last Year:   Transportation Needs:   . Film/video editor (Medical):   [name] Kitchen Lack of Transportation (Non-Medical):   Physical Activity:   . Days of Exercise  per Week:   . Minutes of Exercise per Session:   Stress:   . Feeling of Stress :   Social Connections:   . Frequency of Communication with Friends and Family:   . Frequency of Social Gatherings with Friends and Family:   . Attends Religious Services:   . Active Member of Clubs or Organizations:   . Attends Archivist Meetings:   [name] Kitchen Marital Status:   Intimate Partner Violence:   . Fear of Current or Ex-Partner:   . Emotionally Abused:   [name] Kitchen Physically Abused:   . Sexually Abused:     FAMILY HISTORY: Family History  Problem Relation Age of Onset  . Lung disease Father     ALLERGIES:  has No Known Allergies.  MEDICATIONS:  Current Outpatient Medications  Medication Sig Dispense Refill  . atorvastatin (LIPITOR) 20 MG tablet Take 20 mg by mouth every evening.     . lidocaine-prilocaine (EMLA) cream Apply to affected area once 30 g 3  . metoprolol tartrate (LOPRESSOR) 25 MG tablet Take 25 mg by mouth 2 (two) times daily.    . polyethylene glycol (MIRALAX) 17 g packet Take 17 g by mouth daily as needed for severe constipation. 20 each 1  . prochlorperazine (COMPAZINE) 10 MG tablet Take 1 tablet (10 mg total) by  mouth every 6 (six) hours as needed (Nausea or vomiting). 30 tablet 1  . senna (SENOKOT) 8.6 MG TABS tablet Take 2 tablets (17.2 mg total) by mouth daily. 120 tablet 1  . temazepam (RESTORIL) 30 MG capsule Take 30 mg by mouth at bedtime.      No current facility-administered medications for this visit.   Facility-Administered Medications Ordered in Other Visits  Medication Dose Route Frequency Provider Last Rate Last Admin  . heparin lock flush 100 unit/mL  500 Units Intravenous Once Garrett Server, MD         PHYSICAL EXAMINATION: ECOG PERFORMANCE STATUS: 0 - Asymptomatic There were no vitals filed for this visit. There were no vitals filed for this visit.  Physical Exam Constitutional:      General: He is not in acute distress. HENT:     Head: Normocephalic  and atraumatic.  Eyes:     General: No scleral icterus. Cardiovascular:     Rate and Rhythm: Normal rate and regular rhythm.     Heart sounds: Normal heart sounds.  Pulmonary:     Effort: Pulmonary effort is normal. No respiratory distress.     Breath sounds: No wheezing.  Abdominal:     General: Bowel sounds are normal. There is no distension.     Palpations: Abdomen is soft.  Musculoskeletal:        General: No deformity. Normal range of motion.     Cervical back: Normal range of motion and neck supple.  Skin:    General: Skin is warm and dry.     Findings: No erythema or rash.     Comments: Right anterior chest wall + Mediport  Neurological:     Mental Status: He is alert and oriented to person, place, and time. Mental status is at baseline.     Cranial Nerves: No cranial nerve deficit.     Coordination: Coordination normal.  Psychiatric:        Mood and Affect: Mood normal.     LABORATORY DATA:  I have reviewed the data as listed Lab Results  Component Value Date   WBC 4.7 12/01/2019   HGB 13.1 12/01/2019   HCT 36.9 (L) 12/01/2019   MCV 91.8 12/01/2019   PLT 78 (L) 12/01/2019   Recent Labs    11/02/19 0800 11/09/19 1009 11/17/19 0812  NA 140 138 141  K 3.7 4.1 3.9  CL 105 104 106  CO2 _0 GLUCOSE 129* 117* 132*  BUN _1 CREATININE 0.83 0.96 0.92  CALCIUM 8.9 9.1 9.0  GFRNONAA >60 >60 >60  GFRAA >60 >60 >60  PROT 7.5 8.0 7.5  ALBUMIN 3.8 4.2 3.9  AST _2 ALT _3 ALKPHOS 91 90 92  BILITOT 0.5 0.8 0.5   Iron/TIBC/Ferritin/ %Sat    Component Value Date/Time   IRON 100 09/06/2019 1200   TIBC 368 09/06/2019 1200   FERRITIN 198 09/06/2019 1200   IRONPCTSAT 27 09/06/2019 1200      RADIOGRAPHIC STUDIES: I have personally reviewed the radiological images as listed and agreed with the findings in the report. CT CHEST W CONTRAST  Result Date: 09/07/2019 CLINICAL DATA:  Sigmoid colon mass. Staging colon cancer. EXAM: CT CHEST,  ABDOMEN, AND PELVIS WITH CONTRAST TECHNIQUE: Multidetector CT imaging of the chest, abdomen and pelvis was performed following the standard protocol during bolus administration of intravenous contrast. CONTRAST:  170m OMNIPAQUE IOHEXOL 300 MG/ML  SOLN COMPARISON:  01/26/2010. FINDINGS:  CT CHEST FINDINGS Cardiovascular: Previous median sternotomy and CABG procedure. No pericardial effusion identified. Mediastinum/Nodes: No enlarged mediastinal, hilar, or axillary lymph nodes. Thyroid gland, trachea, and esophagus demonstrate no significant findings. Lungs/Pleura: No pleural effusion identified. The bilateral lower lung zone predominant peripheral reticular and nodular densities are identified which have progressed from 01/26/2010 calcified granuloma identified in the posteromedial left lower lobe. No suspicious pulmonary nodule or mass identified. Musculoskeletal: No chest wall mass or suspicious bone lesions identified. CT ABDOMEN PELVIS FINDINGS Hepatobiliary: No focal liver abnormality is seen. Status post cholecystectomy. No biliary dilatation. Pancreas: Unremarkable. No pancreatic ductal dilatation or surrounding inflammatory changes. Spleen: Normal in size without focal abnormality. Adrenals/Urinary Tract: Normal appearance of the adrenal glands. 8.6 cm left kidney cyst. Hyperdense, exophytic lesion arising from the lower pole of left kidney is noted measuring 63 Hounsfield units. This measures 1.6 x 0.8 cm and is indeterminate. Previously this measured fluid attenuation. Urinary bladder is unremarkable. Stomach/Bowel: Stomach is within normal limits. Appendix appears normal. No evidence of bowel wall thickening, distention, or inflammatory changes. The stomach is nondistended. No small bowel wall thickening, inflammation or distension. The appendix is visualized and appears normal. Colonic diverticulosis identified. The primary sigmoid colon mass is difficult to visualized. No obstructing mass noted.  Vascular/Lymphatic: Aortic atherosclerosis. No aneurysm. No abdominal or pelvic adenopathy. No inguinal adenopathy. Reproductive: Prostate is unremarkable. Other: Bilateral fat containing inguinal hernias. No ascites or focal fluid collections. Musculoskeletal: Degenerative disc disease identified within the lumbar spine. No acute or suspicious bone lesions. IMPRESSION: 1. No acute findings within the chest, abdomen or pelvis. The primary sigmoid colon mass is difficult to visualized. No obstructing mass noted. 2. No specific findings identified to suggest metastatic disease. 3. Aortic atherosclerosis. 4. Bilateral lower lung zone predominant peripheral reticular and nodular densities are identified which have progressed from 01/26/2010 and may reflect interstitial lung disease. Consider further evaluation with nonemergent high-resolution CT of the chest. 5. Hyperdense, exophytic lesion arising from the lower pole of left kidney is noted. This is increased in density this may represent a hemorrhagic or proteinaceous cyst. Consider more definitive characterization with renal protocol MRI without and with contrast material. Aortic Atherosclerosis (ICD10-I70.0). Electronically Signed   By: Kerby Moors M.D.   On: 09/07/2019 09:16   CT ABDOMEN PELVIS W CONTRAST  Result Date: 10/27/2019 CLINICAL DATA:  History of low anterior resection to remove a rectosigmoid carcinoma with contained anastomotic leak ultimately requiring CT-guided placement of a percutaneous drainage catheter placement on 10/18/2019 Patient presents today for abdominal CT and potential drainage catheter injection. Patient reports little to no output from the percutaneous catheter for the past several days. EXAM: CT ABDOMEN AND PELVIS WITH CONTRAST TECHNIQUE: Multidetector CT imaging of the abdomen and pelvis was performed using the standard protocol following bolus administration of intravenous contrast. CONTRAST:  142m OMNIPAQUE IOHEXOL 300  MG/ML  SOLN COMPARISON:  CT abdomen and pelvis-10/13/2019; CT-guided left lower abdominal/pelvic percutaneous drainage catheter placement-10/18/2019 FINDINGS: Lower chest: Limited visualization of the lower thorax redemonstrates minimal bibasilar reticular and ground-glass opacities with subpleural sparing. No discrete focal airspace opacities. No pleural effusion. Cardiomegaly. Post median sternotomy. Calcifications within native coronary arteries. No pericardial effusion. Hepatobiliary: Mild nodularity hepatic contour. There is a minimal amount of focal fatty infiltration adjacent to the fissure for the ligamentum teres. No discrete hepatic lesions. There is a punctate granuloma within the dome of the right lobe of the liver. Post cholecystectomy. No intra extrahepatic bili duct dilatation. No ascites. Pancreas: Normal appearance  of the pancreas. Spleen: Normal appearance of the spleen. Adrenals/Urinary Tract: There is symmetric enhancement and excretion of the bilateral kidneys. There is a punctate (2 mm) nonobstructing stone within the inferior pole the left kidney (image 50, series 6). No evidence of right-sided nephrolithiasis on this postcontrast examination. Note is made of an approximately 8.4 cm hypoattenuating nonenhancing exophytic cyst arising from the anterior superior pole of the left kidney (image 36, series 3). There is an approximately 0.8 cm hyperattenuating nonenhancing partially exophytic lesion arising from the inferior pole the left kidney (axial image 51, series 3; image 31, series 8; coronal image 49, series 6), which is too small to accurately characterize though may represent a hyperdense renal cyst. No discrete right-sided renal lesions. There is a minimal amount of likely age and body habitus related bilateral symmetric perinephric stranding. No evidence of urinary obstruction. Normal appearance the bilateral adrenal glands. Normal appearance of the urinary bladder given degree of  distention. Stomach/Bowel: Stable positioning of left lower abdominal/pelvic percutaneous drainage catheter with resolution of previously noted pericolonic abscess. There is a minimal amount of ill-defined stranding about the descending colon with minimal amount of free fluid in the pelvic cul-de-sac but without definable/drainable fluid collection within the abdomen or pelvis. Interval resolution of postoperative pneumoperitoneum. Stable primary colonic anastomosis within the distal sigmoid/rectum. Large colonic stool burden without evidence of enteric obstruction. Normal appearance of the terminal ileum and appendix. No discrete areas of bowel wall thickening. No pneumoperitoneum, pneumatosis or portal venous gas. Vascular/Lymphatic: Moderate to large amount of mixed calcified and noncalcified atherosclerotic plaque within normal caliber abdominal aorta. The major branch vessels of the abdominal aorta appear patent on this non CTA examination. No bulky retroperitoneal, mesenteric, pelvic or inguinal lymphadenopathy. Reproductive: Dystrophic calcifications within normal sized prostate gland. There is a small amount of likely reactive free fluid in the pelvic cul-de-sac. Other: Interval resolution of previously noted left lateral abdominal wall subcutaneous emphysema. Small bilateral mesenteric fat containing inguinal hernias. Linear areas of subcutaneous stranding involving the ventral aspect of the abdominal wall likely represent laparotomy ports (axial images 24, 27, 32 and 46). No associated hernia. There is a minimal amount of subcutaneous edema about the midline of the low back. Musculoskeletal: No acute or aggressive osseous abnormalities. 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 Moderate bilateral facet degenerative change of the lower lumbar spine. Moderate degenerative change the bilateral  hips with joint space loss, subchondral sclerosis and osteophytosis. IMPRESSION: 1. Complete resolution of left lower abdominal/pelvic abscess following percutaneous drainage catheter placement. 2. There is a minimal amount of mesenteric stranding about the descending colon and ill-defined free fluid in the pelvic cul-de-sac without new definable/drainable fluid collection within the abdomen or pelvis. 3. Interval resolution of postoperative pneumoperitoneum and body wall subcutaneous emphysema. 4. Stable sequela of distal colonic primary anastomosis without evidence of enteric obstruction. 5. Extensive colonic diverticulosis without evidence superimposed acute diverticulitis. 6. Large colonic stool burden. 7. Punctate (2 mm) nonobstructing left-sided renal stone. 8. Mild nodularity of the hepatic contour as could be seen in the setting of early cirrhotic change. Correlation with LFTs is advised. 9.  Aortic Atherosclerosis (ICD10-I70.0). PLAN: Patient subsequently underwent percutaneous drainage catheter injection. Electronically Signed   By: Sandi Mariscal M.D.   On: 10/27/2019 11:06   CT ABDOMEN PELVIS W CONTRAST  Result Date: 10/13/2019 CLINICAL DATA:  74 year old male with concern for peritonitis and bowel perforation.  Pneumoperitoneum seen on earlier radiograph. EXAM: CT ABDOMEN AND PELVIS WITH CONTRAST TECHNIQUE: Multidetector CT imaging of the abdomen and pelvis was performed using the standard protocol following bolus administration of intravenous contrast. CONTRAST:  111m OMNIPAQUE IOHEXOL 300 MG/ML  SOLN COMPARISON:  Abdominal radiograph dated 10/13/2019 and CT dated 09/06/2019 FINDINGS: Lower chest: Minimal bibasilar dependent atelectasis/scarring or sequela of prior infectious process. This findings are similar to the CT of 09/06/2019. Advanced 3 vessel coronary vascular calcification. There is moderate pneumoperitoneum with the majority of the air in the upper abdomen beneath the diaphragm. Small  perihepatic free fluid. Hepatobiliary: Probable background of fatty liver. No intrahepatic biliary ductal dilatation. Cholecystectomy. No retained calcified stone in the central CBD. Pancreas: Unremarkable. No pancreatic ductal dilatation or surrounding inflammatory changes. Spleen: Normal in size without focal abnormality. Adrenals/Urinary Tract: The adrenal glands are unremarkable. There is no hydronephrosis on either side. There is symmetric enhancement and excretion of contrast by both kidneys. There is a 9 cm left renal cyst. The visualized ureters and urinary bladder appear unremarkable. Stomach/Bowel: There is sigmoid diverticulosis with muscular hypertrophy. Postsurgical changes of the sigmoid colon with anastomotic suture. Extraluminal air noted adjacent to the sigmoid suture concerning for sutural dehiscence. There is extensive sigmoid diverticulosis and diffuse colonic diverticula. No definite active inflammation. There is no bowel obstruction. The appendix is surgically absent. Vascular/Lymphatic: Advanced aortoiliac atherosclerotic disease. The IVC is unremarkable. No portal venous gas. There is no adenopathy. Reproductive: The prostate and seminal vesicles are grossly unremarkable. Other: There is a 5.2 x 4.1 cm loculated fluid collection in the left lower abdomen concerning for an infected collection or abscess, possibly sequela of prior sigmoid diverticulitis. Clinical correlation is recommended. Musculoskeletal: Degenerative changes of the spine. No acute osseous pathology. IMPRESSION: 1. Moderate pneumoperitoneum with the majority of the air in the upper abdomen beneath the diaphragm. A pocket of air noted adjacent to the sigmoid anastomotic suture suggests sutural dehiscence. 2. Extensive colonic diverticulosis. No bowel obstruction. 3. A 5.2 x 4.1 cm loculated fluid collection in the left lower abdomen concerning for an infected collection or abscess, possibly sequela of prior sigmoid  diverticulitis. Clinical correlation is recommended. 4. Aortic Atherosclerosis (ICD10-I70.0). These results were called by telephone at the time of interpretation on 10/13/2019 at 3:37 pm to [facility] , who verbally acknowledged these results. Electronically Signed   By: AAnner CreteM.D.   On: 10/13/2019 15:50   CT ABDOMEN PELVIS W CONTRAST  Result Date: 09/07/2019 CLINICAL DATA:  Sigmoid colon mass. Staging colon cancer. EXAM: CT CHEST, ABDOMEN, AND PELVIS WITH CONTRAST TECHNIQUE: Multidetector CT imaging of the chest, abdomen and pelvis was performed following the standard protocol during bolus administration of intravenous contrast. CONTRAST:  1070mOMNIPAQUE IOHEXOL 300 MG/ML  SOLN COMPARISON:  01/26/2010. FINDINGS: CT CHEST FINDINGS Cardiovascular: Previous median sternotomy and CABG procedure. No pericardial effusion identified. Mediastinum/Nodes: No enlarged mediastinal, hilar, or axillary lymph nodes. Thyroid gland, trachea, and esophagus demonstrate no significant findings. Lungs/Pleura: No pleural effusion identified. The bilateral lower lung zone predominant peripheral reticular and nodular densities are identified which have progressed from 01/26/2010 calcified granuloma identified in the posteromedial left lower lobe. No suspicious pulmonary nodule or mass identified. Musculoskeletal: No chest wall mass or suspicious bone lesions identified. CT ABDOMEN PELVIS FINDINGS Hepatobiliary: No focal liver abnormality is seen. Status post cholecystectomy. No biliary dilatation. Pancreas: Unremarkable. No pancreatic ductal dilatation or surrounding inflammatory changes. Spleen: Normal in size without focal abnormality. Adrenals/Urinary Tract: Normal appearance of the adrenal  glands. 8.6 cm left kidney cyst. Hyperdense, exophytic lesion arising from the lower pole of left kidney is noted measuring 63 Hounsfield units. This measures 1.6 x 0.8 cm and is indeterminate. Previously this  measured fluid attenuation. Urinary bladder is unremarkable. Stomach/Bowel: Stomach is within normal limits. Appendix appears normal. No evidence of bowel wall thickening, distention, or inflammatory changes. The stomach is nondistended. No small bowel wall thickening, inflammation or distension. The appendix is visualized and appears normal. Colonic diverticulosis identified. The primary sigmoid colon mass is difficult to visualized. No obstructing mass noted. Vascular/Lymphatic: Aortic atherosclerosis. No aneurysm. No abdominal or pelvic adenopathy. No inguinal adenopathy. Reproductive: Prostate is unremarkable. Other: Bilateral fat containing inguinal hernias. No ascites or focal fluid collections. Musculoskeletal: Degenerative disc disease identified within the lumbar spine. No acute or suspicious bone lesions. IMPRESSION: 1. No acute findings within the chest, abdomen or pelvis. The primary sigmoid colon mass is difficult to visualized. No obstructing mass noted. 2. No specific findings identified to suggest metastatic disease. 3. Aortic atherosclerosis. 4. Bilateral lower lung zone predominant peripheral reticular and nodular densities are identified which have progressed from 01/26/2010 and may reflect interstitial lung disease. Consider further evaluation with nonemergent high-resolution CT of the chest. 5. Hyperdense, exophytic lesion arising from the lower pole of left kidney is noted. This is increased in density this may represent a hemorrhagic or proteinaceous cyst. Consider more definitive characterization with renal protocol MRI without and with contrast material. Aortic Atherosclerosis (ICD10-I70.0). Electronically Signed   By: Kerby Moors M.D.   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 CT scan performed earlier today demonstrates complete resolution of the pericolonic abscess. As such patient now presents for drainage catheter injection. Patient reports little to no output from the percutaneous drainage catheter for the past several days. EXAM: SINUS TRACT INJECTION/FISTULOGRAM COMPARISON:  CT abdomen and pelvis-earlier same day; 10/13/2019; CT-guided percutaneous catheter placement-10/18/2019 CONTRAST:  15 cc Omnipaque 300 - administered via the existing percutaneous drain. FLUOROSCOPY TIME:  42 seconds (14 mGy) TECHNIQUE: The patient was positioned supine on the fluoroscopy table. A preprocedural spot fluoroscopic image was obtained of the left lower abdomen/pelvis and the existing percutaneous drainage catheter. Multiple spot fluoroscopic and radiographic images were obtained following the injection of a small amount of contrast via the existing percutaneous drainage catheter. Images reviewed and discussed with the patient. The decision was made to remove the percutaneous drainage catheter. As such, the external portion of the drainage catheter was cut and drainage catheters removed intact. A dressing was applied. The patient tolerated the procedure well without immediate postprocedural complication. FINDINGS: Preprocedural spot fluoroscopic image demonstrates a percutaneous drainage catheter overlying the left lower abdomen/pelvis. Excreted contrast from recent abdominal CT is seen within the urinary bladder. Contrast injection demonstrates opacification of decompressed abscess cavity with reflux of contrast along the catheter tract. There is no definitive communication between the decompressed abscess cavity and the adjacent intestines as confirmed following aspiration of the entirety of the injected enteric contrast. IMPRESSION: Drain injection is negative for the presence of an enteric fistula. Given lack of output from the drainage catheter for the past  several days, resolution of the pericolonic abscess on preceding abdominal CT and injection being negative for the presence of an enteric fistula, the drainage catheter was removed at the patient's bedside  without incident. PLAN: Patient instructed to maintain all follow-up appointments with his referring surgeon Windell Moment) as well as oncologist (Dr. Tasia Catchings), and may otherwise follow-up at the interventional radiology drain clinic a p.r.n. basis. Electronically Signed   By: Sandi Mariscal M.D.   On: 10/27/2019 12:11   DG Chest Port 1 View  Addendum Date: 10/13/2019   ADDENDUM REPORT: 10/13/2019 10:57 ADDENDUM: Free air versus is bowel related artifact beneath the LEFT hemidiaphragm was discussed with the referring physician is outlined below. These results were called by telephone at the time of interpretation on 10/13/2019 at 10:57 am to provider [facility] CINTRON-DIAZ , who verbally acknowledged these results. Electronically Signed   By: Zetta Bills M.D.   On: 10/13/2019 10:57   Result Date: 10/13/2019 CLINICAL DATA:  Postop Port-A-Cath placement EXAM: PORTABLE CHEST 1 VIEW COMPARISON:  Chest abdomen and pelvis CT 09/06/2019 FINDINGS: RIGHT IJ Port-A-Cath terminates in the mid to distal superior vena cava. Cardiomediastinal contours are normal. Post median sternotomy and CABG. Lungs are clear.  No sign of pneumothorax. Lucency beneath the LEFT hemidiaphragm is not clearly within bowel loops and is seen extending laterally. No lucency beneath RIGHT hemidiaphragm. IMPRESSION: RIGHT IJ Port-A-Cath terminates in the mid to distal superior vena cava. No pneumothorax. Question of free air beneath the LEFT hemidiaphragm, potentially contained within loops of bowel though there are loops of bowel in this area neck can be visualized separate from the area of concern and extends more laterally than expected for bowel gas. Dedicated upright abdominal radiograph may be helpful. A call is out to the referring provider to  further discuss findings in the above case. Electronically Signed: By: Zetta Bills M.D. On: 10/13/2019 10:47   DG Abd 2 Views  Result Date: 10/13/2019 CLINICAL DATA:  Question of free air on chest x-ray. EXAM: ABDOMEN - 2 VIEW COMPARISON:  Chest x-ray same date FINDINGS: There is lucency beneath RIGHT and LEFT hemidiaphragm. Median sternotomy changes are demonstrated. Opacity of gas in the abdomen within loops of bowel. Post cholecystectomy. Spinal degenerative changes as before. IMPRESSION: Lucency beneath the RIGHT and LEFT hemidiaphragm. This could be due to free air, the patient did undergo a surgical procedure greater than 2 weeks ago. Further evaluation with CT is recommended. No sign of bowel obstruction. These results were called by telephone at the time of interpretation on 10/13/2019 at 12:50 pm to provider [facility] CINTRON-DIAZ , who verbally acknowledged these results. Electronically Signed   By: Zetta Bills M.D.   On: 10/13/2019 12:53   DG BE (COLON)W SINGLE CM (SOL OR THIN BA)  Result Date: 10/14/2019 CLINICAL DATA:  Large bowel anastomotic leak. EXAM: WATER SOLUBLE CONTRAST ENEMA TECHNIQUE: Standard water soluble contrast enema performed with multiple images obtained. FLUOROSCOPY TIME:  Fluoroscopy Time:  2 minutes 24 seconds Radiation Exposure Index (if provided by the fluoroscopic device): 125.6 mGy COMPARISON:  CT 10/13/2019. FINDINGS: Narrowing of the rectosigmoid colon the level of the anastomosis is noted. What appears to be a prominent contained leak noted at this level. Remainder of the colon is widely patent with scattered diverticuli. No mass lesion or obstructing lesion otherwise noted. Terminal ileum not identified. IMPRESSION: Narrowing of the recto sigmoid at the level of the anastomosis. What appears to be a prominent contained leak is noted at this level. These results will be called to the ordering clinician or representative by the Radiologist Assistant, and communication  documented in the PACS or [employer]. Electronically Signed   By: Marcello Moores  Register  On: 10/14/2019 11:53   DG C-Arm 1-60 Min-No Report  Result Date: 10/13/2019 Fluoroscopy was utilized by the requesting physician.  No radiographic interpretation.   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 Contained anastomotic leak has been a covered by imaging with development a left lower quadrant pelvic fluid collection/abscess. The patient presents for drainage of the fluid collection. EXAM: CT GUIDED CATHETER DRAINAGE OF PERITONEAL ABSCESS ANESTHESIA/SEDATION: 2.0 mg IV Versed 100 mcg IV Fentanyl Total Moderate Sedation Time:  20 minutes The patient's level of consciousness and physiologic status were continuously monitored during the procedure by Radiology nursing. PROCEDURE: The procedure, risks, benefits, and alternatives were explained to the patient. Questions regarding the procedure were encouraged and answered. The patient understands and consents to the procedure. A time out was performed prior to initiating the procedure. The left lateral abdominal wall was prepped with chlorhexidine in a sterile fashion, and a sterile drape was applied covering the operative field. A sterile gown and sterile gloves were used for the procedure. Local anesthesia was provided with 1% Lidocaine. CT was performed initially in a supine position followed by a slightly oblique position with the left side raised. From this position, an 18 gauge trocar needle was advanced from an anterolateral approach to the level of the left lower quadrant fluid collection. After confirming needle tip position, fluid was aspirated from the needle. A guidewire was advanced into the collection and the needle removed. The percutaneous tract was dilated and a 12 French percutaneous drainage catheter advanced over the guidewire.  Catheter position was confirmed by CT. Additional fluid was aspirated from the catheter and a fluid sample sent for culture analysis. The drainage catheter was flushed with sterile saline and attached to a suction bulb. The drain was secured at the skin with a Prolene retention suture and StatLock device. COMPLICATIONS: None FINDINGS: Very little anterior window was present to the level of the left-sided upper pelvic peritoneal fluid collection which by CT today measures approximately 4.3 x 5.3 cm. In a slightly oblique position with the left side raise, there was a much improved anterolateral window allowing needle advancement followed by drain placement. There was return of thick, tan colored fluid. A sample was sent for culture analysis. After drain placement, there is good return of fluid with suction bulb drainage. IMPRESSION: CT-guided percutaneous catheter drainage of peritoneal abscess in the left upper pelvis with return of thick, tan fluid. A sample was sent for culture analysis. A 12 French drainage catheter was placed and attached to suction bulb drainage. Electronically Signed   By: Aletta Edouard M.D.   On: 10/18/2019 13:33      ASSESSMENT & PLAN:  1. Rectal cancer ([place])   2. Encounter for antineoplastic chemotherapy   3. Thrombocytopenia ([place])   4. 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 Overall he tolerates well with mild difficulties .  Labs are reviewed and discussed with patient. Counts are borderline but acceptable to proceed with cycle 3 FOLFOX. I will omit bolus 5-FU.  #thrombocytopenia, platelet count is 75, 000, I discussed with patient about bleeding risk.  We will proceed with today's treatment with omitting  bolus 5-FU.  Patient will get repeat CBC in 1 week. #Chemotherapy induced neutropenia, ANC 1.1, continue chemotherapy treatments.    Constipation, symptom has resolved.  Senokot/MiraLAX as needed.  Continue current bowel  regimen.  #Hypertension.  He did not take blood pressure in the morning, blood pressure is high.  167/83.  He is asymptomatic.  He will take his medication when he gets home.  -Renal cyst, patient will need MRI abdomen with and without contrast for further evaluation renal cyst in the future.  Will obtain prior to radiation.  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  Discussed with the patient and encourage him to further discuss with primary care provider for the need of see pulmonology All questions were answered. The patient knows to call the clinic with any problems questions or concerns.  Return of visit: 2 weeks  Garrett Server, MD, PhD Hematology Oncology [facility] Pager- [phone] 12/01/2019

## 2019-12-02 ENCOUNTER — Other Ambulatory Visit: Payer: Self-pay | Admitting: Oncology

## 2019-12-03 ENCOUNTER — Inpatient Hospital Stay: Payer: Medicare Other

## 2019-12-03 ENCOUNTER — Other Ambulatory Visit: Payer: Self-pay

## 2019-12-03 VITALS — BP 146/67 | HR 57 | Temp 98.8°F | Resp 18

## 2019-12-03 DIAGNOSIS — Z5111 Encounter for antineoplastic chemotherapy: Secondary | ICD-10-CM | POA: Diagnosis not present

## 2019-12-03 DIAGNOSIS — C20 Malignant neoplasm of rectum: Secondary | ICD-10-CM

## 2019-12-03 MED ORDER — HEPARIN SOD (PORK) LOCK FLUSH 100 UNIT/ML IV SOLN
500.0000 [IU] | Freq: Once | INTRAVENOUS | Status: AC | PRN
  Administered 2019-12-03: 500 [IU]
  Filled 2019-12-03: qty 5

## 2019-12-03 MED ORDER — SODIUM CHLORIDE 0.9% FLUSH
10.0000 mL | INTRAVENOUS | Status: DC | PRN
  Administered 2019-12-03: 10 mL
  Filled 2019-12-03: qty 10

## 2019-12-03 MED ORDER — HEPARIN SOD (PORK) LOCK FLUSH 100 UNIT/ML IV SOLN
INTRAVENOUS | Status: AC
  Filled 2019-12-03: qty 5

## 2019-12-06 ENCOUNTER — Other Ambulatory Visit: Payer: Self-pay | Admitting: Oncology

## 2019-12-08 ENCOUNTER — Other Ambulatory Visit: Payer: Self-pay | Admitting: Oncology

## 2019-12-08 ENCOUNTER — Other Ambulatory Visit: Payer: Self-pay

## 2019-12-08 ENCOUNTER — Inpatient Hospital Stay: Payer: Medicare Other

## 2019-12-08 DIAGNOSIS — C20 Malignant neoplasm of rectum: Secondary | ICD-10-CM

## 2019-12-08 DIAGNOSIS — Z5111 Encounter for antineoplastic chemotherapy: Secondary | ICD-10-CM | POA: Diagnosis not present

## 2019-12-08 LAB — CBC WITH DIFFERENTIAL/PLATELET
Abs Immature Granulocytes: 0 10*3/uL (ref 0.00–0.07)
Basophils Absolute: 0.1 10*3/uL (ref 0.0–0.1)
Basophils Relative: 1 %
Eosinophils Absolute: 0.4 10*3/uL (ref 0.0–0.5)
Eosinophils Relative: 9 %
HCT: 36.9 % — ABNORMAL LOW (ref 39.0–52.0)
Hemoglobin: 12.8 g/dL — ABNORMAL LOW (ref 13.0–17.0)
Immature Granulocytes: 0 %
Lymphocytes Relative: 60 %
Lymphs Abs: 2.3 10*3/uL (ref 0.7–4.0)
MCH: 32.8 pg (ref 26.0–34.0)
MCHC: 34.7 g/dL (ref 30.0–36.0)
MCV: 94.6 fL (ref 80.0–100.0)
Monocytes Absolute: 0.5 10*3/uL (ref 0.1–1.0)
Monocytes Relative: 13 %
Neutro Abs: 0.7 10*3/uL — ABNORMAL LOW (ref 1.7–7.7)
Neutrophils Relative %: 17 %
Platelets: 91 10*3/uL — ABNORMAL LOW (ref 150–400)
RBC: 3.9 MIL/uL — ABNORMAL LOW (ref 4.22–5.81)
RDW: 15.9 % — ABNORMAL HIGH (ref 11.5–15.5)
WBC: 3.9 10*3/uL — ABNORMAL LOW (ref 4.0–10.5)
nRBC: 0 % (ref 0.0–0.2)

## 2019-12-08 LAB — COMPREHENSIVE METABOLIC PANEL
ALT: 40 U/L (ref 0–44)
AST: 36 U/L (ref 15–41)
Albumin: 3.8 g/dL (ref 3.5–5.0)
Alkaline Phosphatase: 100 U/L (ref 38–126)
Anion gap: 7 (ref 5–15)
BUN: 9 mg/dL (ref 8–23)
CO2: 26 mmol/L (ref 22–32)
Calcium: 8.7 mg/dL — ABNORMAL LOW (ref 8.9–10.3)
Chloride: 106 mmol/L (ref 98–111)
Creatinine, Ser: 0.91 mg/dL (ref 0.61–1.24)
GFR calc Af Amer: >60 mL/min (ref >60)
GFR calc non Af Amer: >60 mL/min (ref >60)
Glucose, Bld: 133 mg/dL — ABNORMAL HIGH (ref 70–99)
Potassium: 3.9 mmol/L (ref 3.5–5.1)
Sodium: 139 mmol/L (ref 135–145)
Total Bilirubin: 0.6 mg/dL (ref 0.3–1.2)
Total Protein: 6.9 g/dL (ref 6.5–8.1)

## 2019-12-08 MED ORDER — FILGRASTIM-SNDZ 480 MCG/0.8ML IJ SOSY
480.0000 ug | PREFILLED_SYRINGE | Freq: Once | INTRAMUSCULAR | Status: AC
  Administered 2019-12-08: 480 ug via SUBCUTANEOUS
  Filled 2019-12-08: qty 0.8

## 2019-12-09 ENCOUNTER — Telehealth: Payer: Self-pay

## 2019-12-09 NOTE — Telephone Encounter (Signed)
Done... 12/17/19 d/c pump day *NEW* Onpro. Day 3 has been added.

## 2019-12-09 NOTE — Telephone Encounter (Signed)
Please add to his d/c pump day *new* Onpro.

## 2019-12-09 NOTE — Telephone Encounter (Signed)
-----  Message from Earlie Server, MD sent at 12/08/2019 11:58 PM EDT ----- Please add Onpro to his next cycle Day 3 treatment. Thanks.

## 2019-12-09 NOTE — Telephone Encounter (Signed)
Arnaldo Natal, insurance p/a dept, had to d/c the Zar[employer] authorization on file to get Onpro approved.

## 2019-12-10 NOTE — Telephone Encounter (Signed)
Neulasta Onpro is now approved and Zarxio has been cancelled.

## 2019-12-15 ENCOUNTER — Inpatient Hospital Stay: Payer: Medicare Other

## 2019-12-15 ENCOUNTER — Other Ambulatory Visit: Payer: Self-pay

## 2019-12-15 ENCOUNTER — Encounter: Payer: Self-pay | Admitting: Oncology

## 2019-12-15 ENCOUNTER — Inpatient Hospital Stay (HOSPITAL_BASED_OUTPATIENT_CLINIC_OR_DEPARTMENT_OTHER): Payer: Medicare Other | Admitting: Oncology

## 2019-12-15 ENCOUNTER — Inpatient Hospital Stay: Payer: Medicare Other | Attending: Oncology

## 2019-12-15 VITALS — BP 149/77 | HR 56 | Temp 96.7°F | Wt 214.5 lb

## 2019-12-15 DIAGNOSIS — D696 Thrombocytopenia, unspecified: Secondary | ICD-10-CM | POA: Insufficient documentation

## 2019-12-15 DIAGNOSIS — N281 Cyst of kidney, acquired: Secondary | ICD-10-CM | POA: Insufficient documentation

## 2019-12-15 DIAGNOSIS — I119 Hypertensive heart disease without heart failure: Secondary | ICD-10-CM | POA: Insufficient documentation

## 2019-12-15 DIAGNOSIS — Z85828 Personal history of other malignant neoplasm of skin: Secondary | ICD-10-CM | POA: Diagnosis not present

## 2019-12-15 DIAGNOSIS — C20 Malignant neoplasm of rectum: Secondary | ICD-10-CM | POA: Insufficient documentation

## 2019-12-15 DIAGNOSIS — Z79899 Other long term (current) drug therapy: Secondary | ICD-10-CM | POA: Insufficient documentation

## 2019-12-15 DIAGNOSIS — Z5111 Encounter for antineoplastic chemotherapy: Secondary | ICD-10-CM | POA: Diagnosis not present

## 2019-12-15 LAB — CBC WITH DIFFERENTIAL/PLATELET
Abs Immature Granulocytes: 0.02 10*3/uL (ref 0.00–0.07)
Basophils Absolute: 0.1 10*3/uL (ref 0.0–0.1)
Basophils Relative: 2 %
Eosinophils Absolute: 0.2 10*3/uL (ref 0.0–0.5)
Eosinophils Relative: 4 %
HCT: 38.9 % — ABNORMAL LOW (ref 39.0–52.0)
Hemoglobin: 13.5 g/dL (ref 13.0–17.0)
Immature Granulocytes: 0 %
Lymphocytes Relative: 48 %
Lymphs Abs: 2.3 10*3/uL (ref 0.7–4.0)
MCH: 32.8 pg (ref 26.0–34.0)
MCHC: 34.7 g/dL (ref 30.0–36.0)
MCV: 94.4 fL (ref 80.0–100.0)
Monocytes Absolute: 1.2 10*3/uL — ABNORMAL HIGH (ref 0.1–1.0)
Monocytes Relative: 25 %
Neutro Abs: 1 10*3/uL — ABNORMAL LOW (ref 1.7–7.7)
Neutrophils Relative %: 21 %
Platelets: 84 10*3/uL — ABNORMAL LOW (ref 150–400)
RBC: 4.12 MIL/uL — ABNORMAL LOW (ref 4.22–5.81)
RDW: 16.9 % — ABNORMAL HIGH (ref 11.5–15.5)
WBC: 4.7 10*3/uL (ref 4.0–10.5)
nRBC: 0 % (ref 0.0–0.2)

## 2019-12-15 LAB — COMPREHENSIVE METABOLIC PANEL
ALT: 32 U/L (ref 0–44)
AST: 31 U/L (ref 15–41)
Albumin: 3.7 g/dL (ref 3.5–5.0)
Alkaline Phosphatase: 116 U/L (ref 38–126)
Anion gap: 8 (ref 5–15)
BUN: 9 mg/dL (ref 8–23)
CO2: 26 mmol/L (ref 22–32)
Calcium: 8.8 mg/dL — ABNORMAL LOW (ref 8.9–10.3)
Chloride: 105 mmol/L (ref 98–111)
Creatinine, Ser: 0.89 mg/dL (ref 0.61–1.24)
GFR calc Af Amer: >60 mL/min (ref >60)
GFR calc non Af Amer: >60 mL/min (ref >60)
Glucose, Bld: 136 mg/dL — ABNORMAL HIGH (ref 70–99)
Potassium: 3.4 mmol/L — ABNORMAL LOW (ref 3.5–5.1)
Sodium: 139 mmol/L (ref 135–145)
Total Bilirubin: 0.7 mg/dL (ref 0.3–1.2)
Total Protein: 7.2 g/dL (ref 6.5–8.1)

## 2019-12-15 MED ORDER — LEUCOVORIN CALCIUM INJECTION 350 MG
900.0000 mg | Freq: Once | INTRAVENOUS | Status: AC
  Administered 2019-12-15: 900 mg via INTRAVENOUS
  Filled 2019-12-15: qty 25

## 2019-12-15 MED ORDER — SODIUM CHLORIDE 0.9 % IV SOLN
2400.0000 mg/m2 | INTRAVENOUS | Status: DC
  Administered 2019-12-15: 5350 mg via INTRAVENOUS
  Filled 2019-12-15: qty 107

## 2019-12-15 MED ORDER — DEXTROSE 5 % IV SOLN
Freq: Once | INTRAVENOUS | Status: AC
  Filled 2019-12-15: qty 250

## 2019-12-15 MED ORDER — OXALIPLATIN CHEMO INJECTION 100 MG/20ML
185.0000 mg | Freq: Once | INTRAVENOUS | Status: AC
  Administered 2019-12-15: 185 mg via INTRAVENOUS
  Filled 2019-12-15: qty 37

## 2019-12-15 MED ORDER — PALONOSETRON HCL INJECTION 0.25 MG/5ML
0.2500 mg | Freq: Once | INTRAVENOUS | Status: AC
  Administered 2019-12-15: 0.25 mg via INTRAVENOUS
  Filled 2019-12-15: qty 5

## 2019-12-15 MED ORDER — SODIUM CHLORIDE 0.9 % IV SOLN
10.0000 mg | Freq: Once | INTRAVENOUS | Status: AC
  Administered 2019-12-15: 10 mg via INTRAVENOUS
  Filled 2019-12-15: qty 10

## 2019-12-15 NOTE — Progress Notes (Signed)
.  Hematology/Oncology follow up  note [facility] Telephone:([phone] Fax:([phone]) [phone]   Patient Care Team: Albina Billet, MD as PCP - General (Internal Medicine) Clent Jacks, RN as Oncology Nurse Navigator Earlie Server, MD as Consulting Physician (Oncology)  REFERRING PROVIDER: Albina Billet, MD  CHIEF COMPLAINTS/REASON FOR VISIT:  Follow-up for colorectal cancer  HISTORY OF PRESENTING ILLNESS:   Garrett Peterson is a  74 y.o.  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 Alice Reichert for follow-up on history of colon polyps.  Patient also has reported recent history of change of bowel habits.  He has felt more constipated. No blood in the stool, abdominal pain, unintentional weight loss or fever or chills. Colonoscopy records is at [facility] clinic and is not available to me. 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  MMR is deferred to resection specimen.  # 09/20/2019, patient underwent robotic assisted laparoscopic low anterior resection. Pathology report was reviewed by me and discussed with patient and his wife. I also called pathology and discussed with Dr. Reuel Derby. The tumor is located 1.5 cm distal to the rectosigmoid junction.  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  I explained to patient the different final diagnosis of cancer of rectum rather than endoscopic impression of distal sigmoid colon cancer.  Tumor was not visualized on preop CT scan.  -pT3 pN1 M0, grade 2 adenocarcinoma. MMR status is intact  All margins were uninvolved by invasive carcinoma, high-grade dysplasia/intramucosal adenocarcinoma and low-grade dysplastic.  1 out of 8 regional lymph nodes were positive for malignancy. Patient has lymphovascular  invasion as well as perineural invasion. I discussed with patient that ideally his rectal cancer would be better treated with total neoadjuvant treatment prior to the surgery if we would know his final diagnosis will be an upper rectal cancer.  Preoperative treatments is associated with a more favorable long-term toxicity profile and fever local recurrence then postoperative therapy.  However overall survival appears to be similar.In this case, I would recommend chemotherapy and adjuvant chemo- radiation  # 10/13/2019 patient had CT abdomen pelvis with contrast for evaluation of concern of peritonitis and bowel perforation.  Pneumoperitoneum was seen on earlier radiograph.  There was moderate pneumoperitoneum, extensive colonic diverticulosis.  5.2 x 4.1 cm loculated fluid collection in the left lower abdomen concerning for an infected collection or abscess. Patient had a drain placed and finished a course of antibiotics.  Draining catheter was pulled.  #11/02/2019, patient started adjuvant FOLFOX treatments.  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  Discussed with the patient and encourage him to further discuss with primary care provider for the need of see pulmonology  INTERVAL HISTORY Garrett Peterson is a 74 y.o. male who has above history reviewed by me today presents for follow up visit for management of colorectal cancer. Problems and complaints are listed below: Patient is on neoadjuvant FOLFOX.  Overall he tolerates well.  Denies any neuropathy  Denies nausea, vomiting, fever or chills.  Appetite is fair.  No bleeding events. . Review of Systems  Constitutional: Negative for appetite change, chills, fatigue, fever and unexpected weight change.  HENT:   Negative for hearing loss and voice change.   Eyes: Negative for eye problems and icterus.  Respiratory:  Negative for chest tightness, cough and shortness of breath.   Cardiovascular: Negative  for chest pain and leg swelling.  Gastrointestinal: Negative for abdominal distention and abdominal pain.  Endocrine: Negative for hot flashes.  Genitourinary: Negative for difficulty urinating, dysuria and frequency.   Musculoskeletal: Negative for arthralgias.  Skin: Negative for itching and rash.  Neurological: Negative for light-headedness and numbness.  Hematological: Negative for adenopathy. Does not bruise/bleed easily.  Psychiatric/Behavioral: Negative for confusion.    MEDICAL HISTORY:  Past Medical History:  Diagnosis Date   Arthritis    Basal cell carcinoma    CAD (coronary artery disease)    Cancer ([place]) 2021   rectal   Hyperlipidemia    Pre-diabetes     SURGICAL HISTORY: Past Surgical History:  Procedure Laterality Date   APPENDECTOMY     CARDIAC CATHETERIZATION     COLONOSCOPY     CORONARY ARTERY BYPASS GRAFT     IR SINUS/FIST TUBE CHK-NON GI  10/27/2019   open heart surgery     PORTACATH PLACEMENT Right 10/13/2019   Procedure: INSERTION PORT-A-CATH;  Surgeon: Herbert Pun, MD;  Location: [facility] ORS;  Service: General;  Laterality: Right;    SOCIAL HISTORY: Social History   Socioeconomic History   Marital status: Married    Spouse name: Not on file   Number of children: Not on file   Years of education: Not on file   Highest education level: Not on file  Occupational History   Not on file  Tobacco Use   Smoking status: Never Smoker   Smokeless tobacco: Never Used  Vaping Use   Vaping Use: Never used  Substance and Sexual Activity   Alcohol use: Not Currently    Alcohol/week: 0.0 standard drinks   Drug use: Never   Sexual activity: Not Currently  Other Topics Concern   Not on file  Social History Narrative   Not on file   Social Determinants of Health   Financial Resource Strain:    Difficulty of Paying Living Expenses:   Food Insecurity:    Worried About Charity fundraiser in the Last Year:    Youth worker in the Last Year:   Transportation Needs:    Film/video editor (Medical):    Lack of Transportation (Non-Medical):   Physical Activity:    Days of Exercise per Week:    Minutes of Exercise per Session:   Stress:    Feeling of Stress :   Social Connections:    Frequency of Communication with Friends and Family:    Frequency of Social Gatherings with Friends and Family:    Attends Religious Services:    Active Member of Clubs or Organizations:    Attends Music therapist:    Marital Status:   Intimate Partner Violence:    Fear of Current or Ex-Partner:    Emotionally Abused:    Physically Abused:    Sexually Abused:     FAMILY HISTORY: Family History  Problem Relation Age of Onset   Lung disease Father     ALLERGIES:  has No Known Allergies.  MEDICATIONS:  Current Outpatient Medications  Medication Sig Dispense Refill   atorvastatin (LIPITOR) 20 MG tablet Take 20 mg by mouth every evening.      lidocaine-prilocaine (EMLA) cream Apply to affected area once 30 g 3   metoprolol tartrate (LOPRESSOR) 25 MG tablet Take 25 mg by mouth 2 (two) times daily.     polyethylene glycol (MIRALAX) 17  g packet Take 17 g by mouth daily as needed for severe constipation. 20 each 1   prochlorperazine (COMPAZINE) 10 MG tablet Take 1 tablet (10 mg total) by mouth every 6 (six) hours as needed (Nausea or vomiting). 30 tablet 1   senna (SENOKOT) 8.6 MG TABS tablet Take 2 tablets (17.2 mg total) by mouth daily. 120 tablet 1   temazepam (RESTORIL) 30 MG capsule Take 30 mg by mouth at bedtime.      No current facility-administered medications for this visit.     PHYSICAL EXAMINATION: ECOG PERFORMANCE STATUS: 0 - Asymptomatic Vitals:   12/15/19 0836  BP: (!) 149/77  Pulse: (!) 56  Temp: (!) 96.7 F (35.9 C)  SpO2: 97%   Filed Weights   12/15/19 0836  Weight: 214 lb 8 oz (97.3 kg)    Physical Exam Constitutional:      General: He is not  in acute distress. HENT:     Head: Normocephalic and atraumatic.  Eyes:     General: No scleral icterus. Cardiovascular:     Rate and Rhythm: Normal rate and regular rhythm.     Heart sounds: Normal heart sounds.  Pulmonary:     Effort: Pulmonary effort is normal. No respiratory distress.     Breath sounds: No wheezing.  Abdominal:     General: Bowel sounds are normal. There is no distension.     Palpations: Abdomen is soft.  Musculoskeletal:        General: No deformity. Normal range of motion.     Cervical back: Normal range of motion and neck supple.  Skin:    General: Skin is warm and dry.     Findings: No erythema or rash.     Comments: Right anterior chest wall + Mediport  Neurological:     Mental Status: He is alert and oriented to person, place, and time. Mental status is at baseline.     Cranial Nerves: No cranial nerve deficit.     Coordination: Coordination normal.  Psychiatric:        Mood and Affect: Mood normal.     LABORATORY DATA:  I have reviewed the data as listed Lab Results  Component Value Date   WBC 3.9 (L) 12/08/2019   HGB 12.8 (L) 12/08/2019   HCT 36.9 (L) 12/08/2019   MCV 94.6 12/08/2019   PLT 91 (L) 12/08/2019   Recent Labs    11/17/19 0812 12/01/19 0814 12/08/19 0832  NA 141 140 139  K 3.9 3.5 3.9  CL 106 107 106  CO2 _0 GLUCOSE 132* 113* 133*  BUN _1 CREATININE 0.92 0.93 0.91  CALCIUM 9.0 8.9 8.7*  GFRNONAA >60 >60 >60  GFRAA >60 >60 >60  PROT 7.5 7.3 6.9  ALBUMIN 3.9 3.9 3.8  AST 27 38 36  ALT 31 52* 40  ALKPHOS 92 96 100  BILITOT 0.5 0.5 0.6   Iron/TIBC/Ferritin/ %Sat    Component Value Date/Time   IRON 100 09/06/2019 1200   TIBC 368 09/06/2019 1200   FERRITIN 198 09/06/2019 1200   IRONPCTSAT 27 09/06/2019 1200      RADIOGRAPHIC STUDIES: I have personally reviewed the radiological images as listed and agreed with the findings in the report. CT ABDOMEN PELVIS W CONTRAST  Result Date:  10/27/2019 CLINICAL DATA:  History of low anterior resection to remove a rectosigmoid carcinoma with contained anastomotic leak ultimately requiring CT-guided placement of a percutaneous drainage catheter placement on 10/18/2019 Patient presents  today for abdominal CT and potential drainage catheter injection. Patient reports little to no output from the percutaneous catheter for the past several days. EXAM: CT ABDOMEN AND PELVIS WITH CONTRAST TECHNIQUE: Multidetector CT imaging of the abdomen and pelvis was performed using the standard protocol following bolus administration of intravenous contrast. CONTRAST:  133m OMNIPAQUE IOHEXOL 300 MG/ML  SOLN COMPARISON:  CT abdomen and pelvis-10/13/2019; CT-guided left lower abdominal/pelvic percutaneous drainage catheter placement-10/18/2019 FINDINGS: Lower chest: Limited visualization of the lower thorax redemonstrates minimal bibasilar reticular and ground-glass opacities with subpleural sparing. No discrete focal airspace opacities. No pleural effusion. Cardiomegaly. Post median sternotomy. Calcifications within native coronary arteries. No pericardial effusion. Hepatobiliary: Mild nodularity hepatic contour. There is a minimal amount of focal fatty infiltration adjacent to the fissure for the ligamentum teres. No discrete hepatic lesions. There is a punctate granuloma within the dome of the right lobe of the liver. Post cholecystectomy. No intra extrahepatic bili duct dilatation. No ascites. Pancreas: Normal appearance of the pancreas. Spleen: Normal appearance of the spleen. Adrenals/Urinary Tract: There is symmetric enhancement and excretion of the bilateral kidneys. There is a punctate (2 mm) nonobstructing stone within the inferior pole the left kidney (image 50, series 6). No evidence of right-sided nephrolithiasis on this postcontrast examination. Note is made of an approximately 8.4 cm hypoattenuating nonenhancing exophytic cyst arising from the anterior  superior pole of the left kidney (image 36, series 3). There is an approximately 0.8 cm hyperattenuating nonenhancing partially exophytic lesion arising from the inferior pole the left kidney (axial image 51, series 3; image 31, series 8; coronal image 49, series 6), which is too small to accurately characterize though may represent a hyperdense renal cyst. No discrete right-sided renal lesions. There is a minimal amount of likely age and body habitus related bilateral symmetric perinephric stranding. No evidence of urinary obstruction. Normal appearance the bilateral adrenal glands. Normal appearance of the urinary bladder given degree of distention. Stomach/Bowel: Stable positioning of left lower abdominal/pelvic percutaneous drainage catheter with resolution of previously noted pericolonic abscess. There is a minimal amount of ill-defined stranding about the descending colon with minimal amount of free fluid in the pelvic cul-de-sac but without definable/drainable fluid collection within the abdomen or pelvis. Interval resolution of postoperative pneumoperitoneum. Stable primary colonic anastomosis within the distal sigmoid/rectum. Large colonic stool burden without evidence of enteric obstruction. Normal appearance of the terminal ileum and appendix. No discrete areas of bowel wall thickening. No pneumoperitoneum, pneumatosis or portal venous gas. Vascular/Lymphatic: Moderate to large amount of mixed calcified and noncalcified atherosclerotic plaque within normal caliber abdominal aorta. The major branch vessels of the abdominal aorta appear patent on this non CTA examination. No bulky retroperitoneal, mesenteric, pelvic or inguinal lymphadenopathy. Reproductive: Dystrophic calcifications within normal sized prostate gland. There is a small amount of likely reactive free fluid in the pelvic cul-de-sac. Other: Interval resolution of previously noted left lateral abdominal wall subcutaneous emphysema. Small  bilateral mesenteric fat containing inguinal hernias. Linear areas of subcutaneous stranding involving the ventral aspect of the abdominal wall likely represent laparotomy ports (axial images 24, 27, 32 and 46). No associated hernia. There is a minimal amount of subcutaneous edema about the midline of the low back. Musculoskeletal: No acute or aggressive osseous abnormalities. 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 Moderate bilateral facet degenerative change of the lower lumbar spine. Moderate degenerative change the bilateral hips with joint space loss,  subchondral sclerosis and osteophytosis. IMPRESSION: 1. Complete resolution of left lower abdominal/pelvic abscess following percutaneous drainage catheter placement. 2. There is a minimal amount of mesenteric stranding about the descending colon and ill-defined free fluid in the pelvic cul-de-sac without new definable/drainable fluid collection within the abdomen or pelvis. 3. Interval resolution of postoperative pneumoperitoneum and body wall subcutaneous emphysema. 4. Stable sequela of distal colonic primary anastomosis without evidence of enteric obstruction. 5. Extensive colonic diverticulosis without evidence superimposed acute diverticulitis. 6. Large colonic stool burden. 7. Punctate (2 mm) nonobstructing left-sided renal stone. 8. Mild nodularity of the hepatic contour as could be seen in the setting of early cirrhotic change. Correlation with LFTs is advised. 9.  Aortic Atherosclerosis (ICD10-I70.0). PLAN: Patient subsequently underwent percutaneous drainage catheter injection. Electronically Signed   By: Sandi Mariscal M.D.   On: 10/27/2019 11:06   CT ABDOMEN PELVIS W CONTRAST  Result Date: 10/13/2019 CLINICAL DATA:  74 year old male with concern for peritonitis and bowel perforation. Pneumoperitoneum seen on earlier radiograph. EXAM: CT  ABDOMEN AND PELVIS WITH CONTRAST TECHNIQUE: Multidetector CT imaging of the abdomen and pelvis was performed using the standard protocol following bolus administration of intravenous contrast. CONTRAST:  143m OMNIPAQUE IOHEXOL 300 MG/ML  SOLN COMPARISON:  Abdominal radiograph dated 10/13/2019 and CT dated 09/06/2019 FINDINGS: Lower chest: Minimal bibasilar dependent atelectasis/scarring or sequela of prior infectious process. This findings are similar to the CT of 09/06/2019. Advanced 3 vessel coronary vascular calcification. There is moderate pneumoperitoneum with the majority of the air in the upper abdomen beneath the diaphragm. Small perihepatic free fluid. Hepatobiliary: Probable background of fatty liver. No intrahepatic biliary ductal dilatation. Cholecystectomy. No retained calcified stone in the central CBD. Pancreas: Unremarkable. No pancreatic ductal dilatation or surrounding inflammatory changes. Spleen: Normal in size without focal abnormality. Adrenals/Urinary Tract: The adrenal glands are unremarkable. There is no hydronephrosis on either side. There is symmetric enhancement and excretion of contrast by both kidneys. There is a 9 cm left renal cyst. The visualized ureters and urinary bladder appear unremarkable. Stomach/Bowel: There is sigmoid diverticulosis with muscular hypertrophy. Postsurgical changes of the sigmoid colon with anastomotic suture. Extraluminal air noted adjacent to the sigmoid suture concerning for sutural dehiscence. There is extensive sigmoid diverticulosis and diffuse colonic diverticula. No definite active inflammation. There is no bowel obstruction. The appendix is surgically absent. Vascular/Lymphatic: Advanced aortoiliac atherosclerotic disease. The IVC is unremarkable. No portal venous gas. There is no adenopathy. Reproductive: The prostate and seminal vesicles are grossly unremarkable. Other: There is a 5.2 x 4.1 cm loculated fluid collection in the left lower abdomen  concerning for an infected collection or abscess, possibly sequela of prior sigmoid diverticulitis. Clinical correlation is recommended. Musculoskeletal: Degenerative changes of the spine. No acute osseous pathology. IMPRESSION: 1. Moderate pneumoperitoneum with the majority of the air in the upper abdomen beneath the diaphragm. A pocket of air noted adjacent to the sigmoid anastomotic suture suggests sutural dehiscence. 2. Extensive colonic diverticulosis. No bowel obstruction. 3. A 5.2 x 4.1 cm loculated fluid collection in the left lower abdomen concerning for an infected collection or abscess, possibly sequela of prior sigmoid diverticulitis. Clinical correlation is recommended. 4. Aortic Atherosclerosis (ICD10-I70.0). These results were called by telephone at the time of interpretation on 10/13/2019 at 3:37 pm to [facility] , who verbally acknowledged these results. Electronically Signed   By: AAnner CreteM.D.   On: 10/13/2019 15:50   IR Sinus/Fist Tube Chk-Non GI  Result Date: 10/27/2019 CLINICAL DATA:  History of low  anterior resection for rectosigmoid carcinoma with contained anastomotic leak, ultimately requiring CT-guided placement of percutaneous catheter on 10/18/2019 Kathlene Cote). CT scan performed earlier today demonstrates complete resolution of the pericolonic abscess. As such patient now presents for drainage catheter injection. Patient reports little to no output from the percutaneous drainage catheter for the past several days. EXAM: SINUS TRACT INJECTION/FISTULOGRAM COMPARISON:  CT abdomen and pelvis-earlier same day; 10/13/2019; CT-guided percutaneous catheter placement-10/18/2019 CONTRAST:  15 cc Omnipaque 300 - administered via the existing percutaneous drain. FLUOROSCOPY TIME:  42 seconds (14 mGy) TECHNIQUE: The patient was positioned supine on the fluoroscopy table. A preprocedural spot fluoroscopic image was obtained of the left lower abdomen/pelvis and the existing  percutaneous drainage catheter. Multiple spot fluoroscopic and radiographic images were obtained following the injection of a small amount of contrast via the existing percutaneous drainage catheter. Images reviewed and discussed with the patient. The decision was made to remove the percutaneous drainage catheter. As such, the external portion of the drainage catheter was cut and drainage catheters removed intact. A dressing was applied. The patient tolerated the procedure well without immediate postprocedural complication. FINDINGS: Preprocedural spot fluoroscopic image demonstrates a percutaneous drainage catheter overlying the left lower abdomen/pelvis. Excreted contrast from recent abdominal CT is seen within the urinary bladder. Contrast injection demonstrates opacification of decompressed abscess cavity with reflux of contrast along the catheter tract. There is no definitive communication between the decompressed abscess cavity and the adjacent intestines as confirmed following aspiration of the entirety of the injected enteric contrast. IMPRESSION: Drain injection is negative for the presence of an enteric fistula. Given lack of output from the drainage catheter for the past several days, resolution of the pericolonic abscess on preceding abdominal CT and injection being negative for the presence of an enteric fistula, the drainage catheter was removed at the patient's bedside without incident. PLAN: Patient instructed to maintain all follow-up appointments with his referring surgeon Windell Moment) as well as oncologist (Dr. Tasia Catchings), and may otherwise follow-up at the interventional radiology drain clinic a p.r.n. basis. Electronically Signed   By: Sandi Mariscal M.D.   On: 10/27/2019 12:11   DG Chest Port 1 View  Addendum Date: 10/13/2019   ADDENDUM REPORT: 10/13/2019 10:57 ADDENDUM: Free air versus is bowel related artifact beneath the LEFT hemidiaphragm was discussed with the referring physician is outlined  below. These results were called by telephone at the time of interpretation on 10/13/2019 at 10:57 am to provider [facility] , who verbally acknowledged these results. Electronically Signed   By: Zetta Bills M.D.   On: 10/13/2019 10:57   Result Date: 10/13/2019 CLINICAL DATA:  Postop Port-A-Cath placement EXAM: PORTABLE CHEST 1 VIEW COMPARISON:  Chest abdomen and pelvis CT 09/06/2019 FINDINGS: RIGHT IJ Port-A-Cath terminates in the mid to distal superior vena cava. Cardiomediastinal contours are normal. Post median sternotomy and CABG. Lungs are clear.  No sign of pneumothorax. Lucency beneath the LEFT hemidiaphragm is not clearly within bowel loops and is seen extending laterally. No lucency beneath RIGHT hemidiaphragm. IMPRESSION: RIGHT IJ Port-A-Cath terminates in the mid to distal superior vena cava. No pneumothorax. Question of free air beneath the LEFT hemidiaphragm, potentially contained within loops of bowel though there are loops of bowel in this area neck can be visualized separate from the area of concern and extends more laterally than expected for bowel gas. Dedicated upright abdominal radiograph may be helpful. A call is out to the referring provider to further discuss findings in the above case. Electronically Signed: By:  Zetta Bills M.D. On: 10/13/2019 10:47   DG Abd 2 Views  Result Date: 10/13/2019 CLINICAL DATA:  Question of free air on chest x-ray. EXAM: ABDOMEN - 2 VIEW COMPARISON:  Chest x-ray same date FINDINGS: There is lucency beneath RIGHT and LEFT hemidiaphragm. Median sternotomy changes are demonstrated. Opacity of gas in the abdomen within loops of bowel. Post cholecystectomy. Spinal degenerative changes as before. IMPRESSION: Lucency beneath the RIGHT and LEFT hemidiaphragm. This could be due to free air, the patient did undergo a surgical procedure greater than 2 weeks ago. Further evaluation with CT is recommended. No sign of bowel obstruction. These results were  called by telephone at the time of interpretation on 10/13/2019 at 12:50 pm to provider [facility] CINTRON[facility]DIAZ , who verbally acknowledged these results. Electronically Signed   By: Zetta Bills M.D.   On: 10/13/2019 12:53   DG BE (COLON)W SINGLE CM (SOL OR THIN BA)  Result Date: 10/14/2019 CLINICAL DATA:  Large bowel anastomotic leak. EXAM: WATER SOLUBLE CONTRAST ENEMA TECHNIQUE: Standard water soluble contrast enema performed with multiple images obtained. FLUOROSCOPY TIME:  Fluoroscopy Time:  2 minutes 24 seconds Radiation Exposure Index (if provided by the fluoroscopic device): 125.6 mGy COMPARISON:  CT 10/13/2019. FINDINGS: Narrowing of the rectosigmoid colon the level of the anastomosis is noted. What appears to be a prominent contained leak noted at this level. Remainder of the colon is widely patent with scattered diverticuli. No mass lesion or obstructing lesion otherwise noted. Terminal ileum not identified. IMPRESSION: Narrowing of the recto sigmoid at the level of the anastomosis. What appears to be a prominent contained leak is noted at this level. These results will be called to the ordering clinician or representative by the Radiologist Assistant, and communication documented in the PACS or [employer]. Electronically Signed   By: Marcello Moores  Register   On: 10/14/2019 11:53   DG C-Arm 1-60 Min-No Report  Result Date: 10/13/2019 Fluoroscopy was utilized by the requesting physician.  No radiographic interpretation.   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 Contained anastomotic leak has been a covered by imaging with development a left lower quadrant pelvic fluid collection/abscess. The patient presents for drainage of the fluid collection. EXAM: CT GUIDED CATHETER DRAINAGE OF PERITONEAL ABSCESS ANESTHESIA/SEDATION: 2.0 mg IV Versed 100 mcg IV Fentanyl Total Moderate Sedation  Time:  20 minutes The patient's level of consciousness and physiologic status were continuously monitored during the procedure by Radiology nursing. PROCEDURE: The procedure, risks, benefits, and alternatives were explained to the patient. Questions regarding the procedure were encouraged and answered. The patient understands and consents to the procedure. A time out was performed prior to initiating the procedure. The left lateral abdominal wall was prepped with chlorhexidine in a sterile fashion, and a sterile drape was applied covering the operative field. A sterile gown and sterile gloves were used for the procedure. Local anesthesia was provided with 1% Lidocaine. CT was performed initially in a supine position followed by a slightly oblique position with the left side raised. From this position, an 18 gauge trocar needle was advanced from an anterolateral approach to the level of the left lower quadrant fluid collection. After confirming needle tip position, fluid was aspirated from the needle. A guidewire was advanced into the collection and the needle removed. The percutaneous tract was dilated and a 12 French percutaneous drainage catheter advanced over the guidewire. Catheter position was confirmed  by CT. Additional fluid was aspirated from the catheter and a fluid sample sent for culture analysis. The drainage catheter was flushed with sterile saline and attached to a suction bulb. The drain was secured at the skin with a Prolene retention suture and StatLock device. COMPLICATIONS: None FINDINGS: Very little anterior window was present to the level of the left-sided upper pelvic peritoneal fluid collection which by CT today measures approximately 4.3 x 5.3 cm. In a slightly oblique position with the left side raise, there was a much improved anterolateral window allowing needle advancement followed by drain placement. There was return of thick, tan colored fluid. A sample was sent for culture analysis.  After drain placement, there is good return of fluid with suction bulb drainage. IMPRESSION: CT-guided percutaneous catheter drainage of peritoneal abscess in the left upper pelvis with return of thick, tan fluid. A sample was sent for culture analysis. A 12 French drainage catheter was placed and attached to suction bulb drainage. Electronically Signed   By: Aletta Edouard M.D.   On: 10/18/2019 13:33      ASSESSMENT & PLAN:  1. Rectal cancer ([place])   2. Encounter for antineoplastic chemotherapy   3. Thrombocytopenia ([place])   4. 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 Labs are reviewed and discussed with patient. Counts acceptable to proceed with cycle 4 FOLFOX. Omit bolus 5-FU.   #thrombocytopenia, platelet count is 84, 000,  #Chemotherapy induced neutropenia, ANC 1.0, his ANC dropped below 1 during the past cycle. Patient will receive Onpro on Day 3 of treatment. Recommend patient to start taking Claritin on day 3 for 4 days.  #Hypertension.  Blood pressure is better controlled.  149/77 today.  Continue current blood pressure regimen.  -Renal cyst, patient will need MRI abdomen with and without contrast for further evaluation renal cyst in the future.  Will obtain prior to radiation.   All questions were answered. The patient knows to call the clinic with any problems questions or concerns.  Return of visit: 2 weeks  Earlie Server, MD, PhD Hematology Oncology [facility] Pager- [phone] 12/15/2019

## 2019-12-15 NOTE — Progress Notes (Signed)
Patient denies new problems/concerns today.   °

## 2019-12-16 LAB — CEA: CEA: 7 ng/mL — ABNORMAL HIGH (ref 0.0–4.7)

## 2019-12-17 ENCOUNTER — Inpatient Hospital Stay: Payer: Medicare Other

## 2019-12-17 ENCOUNTER — Other Ambulatory Visit: Payer: Self-pay

## 2019-12-17 VITALS — BP 142/77 | HR 58 | Temp 98.1°F

## 2019-12-17 DIAGNOSIS — Z5111 Encounter for antineoplastic chemotherapy: Secondary | ICD-10-CM | POA: Diagnosis not present

## 2019-12-17 DIAGNOSIS — C20 Malignant neoplasm of rectum: Secondary | ICD-10-CM

## 2019-12-17 MED ORDER — PEGFILGRASTIM 6 MG/0.6ML ~~LOC~~ PSKT
6.0000 mg | PREFILLED_SYRINGE | Freq: Once | SUBCUTANEOUS | Status: AC
  Administered 2019-12-17: 6 mg via SUBCUTANEOUS
  Filled 2019-12-17: qty 0.6

## 2019-12-17 MED ORDER — HEPARIN SOD (PORK) LOCK FLUSH 100 UNIT/ML IV SOLN
500.0000 [IU] | Freq: Once | INTRAVENOUS | Status: AC | PRN
  Administered 2019-12-17: 500 [IU]
  Filled 2019-12-17: qty 5

## 2019-12-17 MED ORDER — HEPARIN SOD (PORK) LOCK FLUSH 100 UNIT/ML IV SOLN
INTRAVENOUS | Status: AC
  Filled 2019-12-17: qty 5

## 2019-12-17 MED ORDER — SODIUM CHLORIDE 0.9% FLUSH
10.0000 mL | INTRAVENOUS | Status: DC | PRN
  Administered 2019-12-17: 10 mL
  Filled 2019-12-17: qty 10

## 2019-12-18 ENCOUNTER — Telehealth: Payer: Self-pay | Admitting: Hematology and Oncology

## 2019-12-18 NOTE — Telephone Encounter (Signed)
Re:  OnPro Neulasta  Garrett Peterson called for [name] about malfunction of OnPro Neulasta.  The medication did not get injected.  The needle was apparently not in the arm; the medication ran down his arm.  I instructed him to put the OnPro in a zip lock baggie and bring it to the [place] on Monday, 12/20/2019.  He will receive Neulasta when he comes in and be credited for the malfunction of the OnPro.  He will be called on Monday regarding what time to arrive.   Lequita Asal, MD

## 2019-12-20 ENCOUNTER — Inpatient Hospital Stay: Payer: Medicare Other

## 2019-12-20 ENCOUNTER — Other Ambulatory Visit: Payer: Self-pay | Admitting: Oncology

## 2019-12-20 ENCOUNTER — Other Ambulatory Visit: Payer: Self-pay

## 2019-12-20 ENCOUNTER — Telehealth: Payer: Self-pay

## 2019-12-20 DIAGNOSIS — C20 Malignant neoplasm of rectum: Secondary | ICD-10-CM

## 2019-12-20 DIAGNOSIS — Z5111 Encounter for antineoplastic chemotherapy: Secondary | ICD-10-CM | POA: Diagnosis not present

## 2019-12-20 MED ORDER — PEGFILGRASTIM INJECTION 6 MG/0.6ML ~~LOC~~
6.0000 mg | PREFILLED_SYRINGE | Freq: Once | SUBCUTANEOUS | Status: AC
  Administered 2019-12-20: 6 mg via SUBCUTANEOUS
  Filled 2019-12-20: qty 0.6

## 2019-12-20 NOTE — Telephone Encounter (Signed)
Patient called on call physician over the weekend due to the [facility] malfunctioned.   Please schedule him to come in the office today to receive Fulphilia and he is to bring the malfunctioned OnPro to the office.

## 2019-12-20 NOTE — Telephone Encounter (Signed)
Injection needs to be Neulasta instead of Fulphilia due to insurance preference.

## 2019-12-20 NOTE — Telephone Encounter (Signed)
Neulasta injection authorized, per Aleen Sells.

## 2019-12-29 ENCOUNTER — Encounter: Payer: Self-pay | Admitting: Oncology

## 2019-12-29 ENCOUNTER — Inpatient Hospital Stay: Payer: Medicare Other

## 2019-12-29 ENCOUNTER — Inpatient Hospital Stay (HOSPITAL_BASED_OUTPATIENT_CLINIC_OR_DEPARTMENT_OTHER): Payer: Medicare Other | Admitting: Oncology

## 2019-12-29 VITALS — BP 163/83 | HR 60 | Temp 98.8°F | Resp 18 | Wt 216.7 lb

## 2019-12-29 DIAGNOSIS — R030 Elevated blood-pressure reading, without diagnosis of hypertension: Secondary | ICD-10-CM

## 2019-12-29 DIAGNOSIS — C20 Malignant neoplasm of rectum: Secondary | ICD-10-CM | POA: Diagnosis not present

## 2019-12-29 DIAGNOSIS — D696 Thrombocytopenia, unspecified: Secondary | ICD-10-CM | POA: Insufficient documentation

## 2019-12-29 DIAGNOSIS — Z5111 Encounter for antineoplastic chemotherapy: Secondary | ICD-10-CM | POA: Diagnosis not present

## 2019-12-29 LAB — COMPREHENSIVE METABOLIC PANEL
ALT: 33 U/L (ref 0–44)
AST: 46 U/L — ABNORMAL HIGH (ref 15–41)
Albumin: 3.6 g/dL (ref 3.5–5.0)
Alkaline Phosphatase: 184 U/L — ABNORMAL HIGH (ref 38–126)
Anion gap: 9 (ref 5–15)
BUN: 9 mg/dL (ref 8–23)
CO2: 25 mmol/L (ref 22–32)
Calcium: 8.7 mg/dL — ABNORMAL LOW (ref 8.9–10.3)
Chloride: 107 mmol/L (ref 98–111)
Creatinine, Ser: 0.87 mg/dL (ref 0.61–1.24)
GFR calc Af Amer: >60 mL/min (ref >60)
GFR calc non Af Amer: >60 mL/min (ref >60)
Glucose, Bld: 158 mg/dL — ABNORMAL HIGH (ref 70–99)
Potassium: 3 mmol/L — ABNORMAL LOW (ref 3.5–5.1)
Sodium: 141 mmol/L (ref 135–145)
Total Bilirubin: 0.6 mg/dL (ref 0.3–1.2)
Total Protein: 7 g/dL (ref 6.5–8.1)

## 2019-12-29 LAB — CBC WITH DIFFERENTIAL/PLATELET
Abs Immature Granulocytes: 1.18 10*3/uL — ABNORMAL HIGH (ref 0.00–0.07)
Basophils Absolute: 0.2 10*3/uL — ABNORMAL HIGH (ref 0.0–0.1)
Basophils Relative: 1 %
Eosinophils Absolute: 0.4 10*3/uL (ref 0.0–0.5)
Eosinophils Relative: 3 %
HCT: 37.3 % — ABNORMAL LOW (ref 39.0–52.0)
Hemoglobin: 12.7 g/dL — ABNORMAL LOW (ref 13.0–17.0)
Immature Granulocytes: 7 %
Lymphocytes Relative: 23 %
Lymphs Abs: 3.6 10*3/uL (ref 0.7–4.0)
MCH: 32.9 pg (ref 26.0–34.0)
MCHC: 34 g/dL (ref 30.0–36.0)
MCV: 96.6 fL (ref 80.0–100.0)
Monocytes Absolute: 1.7 10*3/uL — ABNORMAL HIGH (ref 0.1–1.0)
Monocytes Relative: 10 %
Neutro Abs: 9.1 10*3/uL — ABNORMAL HIGH (ref 1.7–7.7)
Neutrophils Relative %: 56 %
Platelets: 65 10*3/uL — ABNORMAL LOW (ref 150–400)
RBC: 3.86 MIL/uL — ABNORMAL LOW (ref 4.22–5.81)
RDW: 18.3 % — ABNORMAL HIGH (ref 11.5–15.5)
WBC: 16.1 10*3/uL — ABNORMAL HIGH (ref 4.0–10.5)
nRBC: 0.6 % — ABNORMAL HIGH (ref 0.0–0.2)

## 2019-12-29 MED ORDER — SODIUM CHLORIDE 0.9% FLUSH
10.0000 mL | INTRAVENOUS | Status: DC | PRN
  Administered 2019-12-29: 10 mL via INTRAVENOUS
  Filled 2019-12-29: qty 10

## 2019-12-29 MED ORDER — HEPARIN SOD (PORK) LOCK FLUSH 100 UNIT/ML IV SOLN
500.0000 [IU] | Freq: Once | INTRAVENOUS | Status: AC
  Administered 2019-12-29: 500 [IU] via INTRAVENOUS
  Filled 2019-12-29: qty 5

## 2019-12-29 MED ORDER — HEPARIN SOD (PORK) LOCK FLUSH 100 UNIT/ML IV SOLN
INTRAVENOUS | Status: AC
  Filled 2019-12-29: qty 5

## 2019-12-29 NOTE — Progress Notes (Signed)
.  Hematology/Oncology follow up  note [facility] Telephone:([phone]) [phone] Fax:(336) [phone]   Patient Care Team: Albina Billet, MD as PCP - General (Internal Medicine) Clent Jacks, RN as Oncology Nurse Navigator Earlie Server, MD as Consulting Physician (Oncology)  REFERRING PROVIDER: Albina Billet, MD  CHIEF COMPLAINTS/REASON FOR VISIT:  Follow-up for colorectal cancer  HISTORY OF PRESENTING ILLNESS:   Garrett Peterson is a  74 y.o.  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 Alice Reichert for follow-up on history of colon polyps.  Patient also has reported recent history of change of bowel habits.  He has felt more constipated. No blood in the stool, abdominal pain, unintentional weight loss or fever or chills. Colonoscopy records is at [facility] clinic and is not available to me. 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  MMR is deferred to resection specimen.  # 09/20/2019, patient underwent robotic assisted laparoscopic low anterior resection. Pathology report was reviewed by me and discussed with patient and his wife. I also called pathology and discussed with Dr. Reuel Derby. The tumor is located 1.5 cm distal to the rectosigmoid junction.  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  I explained to patient the different final diagnosis of cancer of rectum rather than endoscopic impression of distal sigmoid colon cancer.  Tumor was not visualized on preop CT scan.  -pT3 pN1 M0, grade 2 adenocarcinoma. MMR status is intact  All margins were uninvolved by invasive carcinoma, high-grade dysplasia/intramucosal adenocarcinoma and low-grade dysplastic.  1 out of 8 regional lymph nodes were positive for malignancy. Patient has lymphovascular  invasion as well as perineural invasion. I discussed with patient that ideally his rectal cancer would be better treated with total neoadjuvant treatment prior to the surgery if we would know his final diagnosis will be an upper rectal cancer.  Preoperative treatments is associated with a more favorable long-term toxicity profile and fever local recurrence then postoperative therapy.  However overall survival appears to be similar.In this case, I would recommend chemotherapy and adjuvant chemo- radiation  # 10/13/2019 patient had CT abdomen pelvis with contrast for evaluation of concern of peritonitis and bowel perforation.  Pneumoperitoneum was seen on earlier radiograph.  There was moderate pneumoperitoneum, extensive colonic diverticulosis.  5.2 x 4.1 cm loculated fluid collection in the left lower abdomen concerning for an infected collection or abscess. Patient had a drain placed and finished a course of antibiotics.  Draining catheter was pulled.  #11/02/2019, patient started adjuvant FOLFOX treatments.  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  Discussed with the patient and encourage him to further discuss with primary care provider for the need of see pulmonology  INTERVAL HISTORY Garrett Peterson is a 74 y.o. male who has above history reviewed by me today presents for follow up visit for management of colorectal cancer. Problems and complaints are listed below: Patient is on neoadjuvant FOLFOX.  Overall he tolerates well.  Denies any neuropathy or bleeding.  No fever or chills. . Review of Systems  Constitutional: Negative for appetite change, chills, fatigue, fever and unexpected weight change.  HENT:   Negative for hearing loss and voice change.   Eyes: Negative for eye problems and icterus.  Respiratory: Negative for chest tightness, cough and shortness of  breath.   Cardiovascular: Negative for chest pain and leg swelling.    Gastrointestinal: Negative for abdominal distention and abdominal pain.  Endocrine: Negative for hot flashes.  Genitourinary: Negative for difficulty urinating, dysuria and frequency.   Musculoskeletal: Negative for arthralgias.  Skin: Negative for itching and rash.  Neurological: Negative for light-headedness and numbness.  Hematological: Negative for adenopathy. Does not bruise/bleed easily.  Psychiatric/Behavioral: Negative for confusion.    MEDICAL HISTORY:  Past Medical History:  Diagnosis Date  . Arthritis   . Basal cell carcinoma   . CAD (coronary artery disease)   . Cancer ([place]) 2021   rectal  . Hyperlipidemia   . Pre-diabetes     SURGICAL HISTORY: Past Surgical History:  Procedure Laterality Date  . APPENDECTOMY    . CARDIAC CATHETERIZATION    . COLONOSCOPY    . CORONARY ARTERY BYPASS GRAFT    . IR SINUS/FIST TUBE CHK-NON GI  10/27/2019  . open heart surgery    . PORTACATH PLACEMENT Right 10/13/2019   Procedure: INSERTION PORT-A-CATH;  Surgeon: Herbert Pun, MD;  Location: [facility] ORS;  Service: General;  Laterality: Right;    SOCIAL HISTORY: Social History   Socioeconomic History  . Marital status: Married    Spouse name: Not on file  . Number of children: Not on file  . Years of education: Not on file  . Highest education level: Not on file  Occupational History  . Not on file  Tobacco Use  . Smoking status: Never Smoker  . Smokeless tobacco: Never Used  Vaping Use  . Vaping Use: Never used  Substance and Sexual Activity  . Alcohol use: Not Currently    Alcohol/week: 0.0 standard drinks  . Drug use: Never  . Sexual activity: Not Currently  Other Topics Concern  . Not on file  Social History Narrative  . Not on file   Social Determinants of Health   Financial Resource Strain:   . Difficulty of Paying Living Expenses:   Food Insecurity:   . Worried About Charity fundraiser in the Last Year:   . Arboriculturist in the Last Year:    Transportation Needs:   . Film/video editor (Medical):   [name] Kitchen Lack of Transportation (Non-Medical):   Physical Activity:   . Days of Exercise per Week:   . Minutes of Exercise per Session:   Stress:   . Feeling of Stress :   Social Connections:   . Frequency of Communication with Friends and Family:   . Frequency of Social Gatherings with Friends and Family:   . Attends Religious Services:   . Active Member of Clubs or Organizations:   . Attends Archivist Meetings:   [name] Kitchen Marital Status:   Intimate Partner Violence:   . Fear of Current or Ex-Partner:   . Emotionally Abused:   [name] Kitchen Physically Abused:   . Sexually Abused:     FAMILY HISTORY: Family History  Problem Relation Age of Onset  . Lung disease Father     ALLERGIES:  has No Known Allergies.  MEDICATIONS:  Current Outpatient Medications  Medication Sig Dispense Refill  . atorvastatin (LIPITOR) 20 MG tablet Take 20 mg by mouth every evening.     . lidocaine-prilocaine (EMLA) cream Apply to affected area once 30 g 3  . metoprolol tartrate (LOPRESSOR) 25 MG tablet Take 25 mg by mouth 2 (two) times daily.    . polyethylene glycol (MIRALAX) 17 g packet Take 17 g by mouth  daily as needed for severe constipation. (Patient not taking: Reported on 12/15/2019) 20 each 1  . prochlorperazine (COMPAZINE) 10 MG tablet Take 1 tablet (10 mg total) by mouth every 6 (six) hours as needed (Nausea or vomiting). (Patient not taking: Reported on 12/15/2019) 30 tablet 1  . senna (SENOKOT) 8.6 MG TABS tablet Take 2 tablets (17.2 mg total) by mouth daily. (Patient not taking: Reported on 12/15/2019) 120 tablet 1  . temazepam (RESTORIL) 30 MG capsule Take 30 mg by mouth at bedtime.      No current facility-administered medications for this visit.     PHYSICAL EXAMINATION: ECOG PERFORMANCE STATUS: 0 - Asymptomatic There were no vitals filed for this visit. There were no vitals filed for this visit.  Physical Exam Constitutional:       General: He is not in acute distress. HENT:     Head: Normocephalic and atraumatic.  Eyes:     General: No scleral icterus. Cardiovascular:     Rate and Rhythm: Normal rate and regular rhythm.     Heart sounds: Normal heart sounds.  Pulmonary:     Effort: Pulmonary effort is normal. No respiratory distress.     Breath sounds: No wheezing.  Abdominal:     General: Bowel sounds are normal. There is no distension.     Palpations: Abdomen is soft.  Musculoskeletal:        General: No deformity. Normal range of motion.     Cervical back: Normal range of motion and neck supple.  Skin:    General: Skin is warm and dry.     Findings: No erythema or rash.     Comments: Right anterior chest wall + Mediport  Neurological:     Mental Status: He is alert and oriented to person, place, and time. Mental status is at baseline.     Cranial Nerves: No cranial nerve deficit.     Coordination: Coordination normal.  Psychiatric:        Mood and Affect: Mood normal.     LABORATORY DATA:  I have reviewed the data as listed Lab Results  Component Value Date   WBC 4.7 12/15/2019   HGB 13.5 12/15/2019   HCT 38.9 (L) 12/15/2019   MCV 94.4 12/15/2019   PLT 84 (L) 12/15/2019   Recent Labs    12/01/19 0814 12/08/19 0832 12/15/19 0812  NA 140 139 139  K 3.5 3.9 3.4*  CL 107 106 105  CO2 _0 GLUCOSE 113* 133* 136*  BUN _1 CREATININE 0.93 0.91 0.89  CALCIUM 8.9 8.7* 8.8*  GFRNONAA >60 >60 >60  GFRAA >60 >60 >60  PROT 7.3 6.9 7.2  ALBUMIN 3.9 3.8 3.7  AST 38 36 31  ALT 52* 40 32  ALKPHOS 96 100 116  BILITOT 0.5 0.6 0.7   Iron/TIBC/Ferritin/ %Sat    Component Value Date/Time   IRON 100 09/06/2019 1200   TIBC 368 09/06/2019 1200   FERRITIN 198 09/06/2019 1200   IRONPCTSAT 27 09/06/2019 1200      RADIOGRAPHIC STUDIES: I have personally reviewed the radiological images as listed and agreed with the findings in the report. CT ABDOMEN PELVIS W CONTRAST  Result Date:  10/27/2019 CLINICAL DATA:  History of low anterior resection to remove a rectosigmoid carcinoma with contained anastomotic leak ultimately requiring CT-guided placement of a percutaneous drainage catheter placement on 10/18/2019 Patient presents today for abdominal CT and potential drainage catheter injection. Patient reports little to no output from  the percutaneous catheter for the past several days. EXAM: CT ABDOMEN AND PELVIS WITH CONTRAST TECHNIQUE: Multidetector CT imaging of the abdomen and pelvis was performed using the standard protocol following bolus administration of intravenous contrast. CONTRAST:  161m OMNIPAQUE IOHEXOL 300 MG/ML  SOLN COMPARISON:  CT abdomen and pelvis-10/13/2019; CT-guided left lower abdominal/pelvic percutaneous drainage catheter placement-10/18/2019 FINDINGS: Lower chest: Limited visualization of the lower thorax redemonstrates minimal bibasilar reticular and ground-glass opacities with subpleural sparing. No discrete focal airspace opacities. No pleural effusion. Cardiomegaly. Post median sternotomy. Calcifications within native coronary arteries. No pericardial effusion. Hepatobiliary: Mild nodularity hepatic contour. There is a minimal amount of focal fatty infiltration adjacent to the fissure for the ligamentum teres. No discrete hepatic lesions. There is a punctate granuloma within the dome of the right lobe of the liver. Post cholecystectomy. No intra extrahepatic bili duct dilatation. No ascites. Pancreas: Normal appearance of the pancreas. Spleen: Normal appearance of the spleen. Adrenals/Urinary Tract: There is symmetric enhancement and excretion of the bilateral kidneys. There is a punctate (2 mm) nonobstructing stone within the inferior pole the left kidney (image 50, series 6). No evidence of right-sided nephrolithiasis on this postcontrast examination. Note is made of an approximately 8.4 cm hypoattenuating nonenhancing exophytic cyst arising from the anterior  superior pole of the left kidney (image 36, series 3). There is an approximately 0.8 cm hyperattenuating nonenhancing partially exophytic lesion arising from the inferior pole the left kidney (axial image 51, series 3; image 31, series 8; coronal image 49, series 6), which is too small to accurately characterize though may represent a hyperdense renal cyst. No discrete right-sided renal lesions. There is a minimal amount of likely age and body habitus related bilateral symmetric perinephric stranding. No evidence of urinary obstruction. Normal appearance the bilateral adrenal glands. Normal appearance of the urinary bladder given degree of distention. Stomach/Bowel: Stable positioning of left lower abdominal/pelvic percutaneous drainage catheter with resolution of previously noted pericolonic abscess. There is a minimal amount of ill-defined stranding about the descending colon with minimal amount of free fluid in the pelvic cul-de-sac but without definable/drainable fluid collection within the abdomen or pelvis. Interval resolution of postoperative pneumoperitoneum. Stable primary colonic anastomosis within the distal sigmoid/rectum. Large colonic stool burden without evidence of enteric obstruction. Normal appearance of the terminal ileum and appendix. No discrete areas of bowel wall thickening. No pneumoperitoneum, pneumatosis or portal venous gas. Vascular/Lymphatic: Moderate to large amount of mixed calcified and noncalcified atherosclerotic plaque within normal caliber abdominal aorta. The major branch vessels of the abdominal aorta appear patent on this non CTA examination. No bulky retroperitoneal, mesenteric, pelvic or inguinal lymphadenopathy. Reproductive: Dystrophic calcifications within normal sized prostate gland. There is a small amount of likely reactive free fluid in the pelvic cul-de-sac. Other: Interval resolution of previously noted left lateral abdominal wall subcutaneous emphysema. Small  bilateral mesenteric fat containing inguinal hernias. Linear areas of subcutaneous stranding involving the ventral aspect of the abdominal wall likely represent laparotomy ports (axial images 24, 27, 32 and 46). No associated hernia. There is a minimal amount of subcutaneous edema about the midline of the low back. Musculoskeletal: No acute or aggressive osseous abnormalities. 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 Moderate bilateral facet degenerative change of the lower lumbar spine. Moderate degenerative change the bilateral hips with joint space loss, subchondral sclerosis and osteophytosis. IMPRESSION: 1. Complete resolution of left lower abdominal/pelvic abscess following percutaneous drainage  catheter placement. 2. There is a minimal amount of mesenteric stranding about the descending colon and ill-defined free fluid in the pelvic cul-de-sac without new definable/drainable fluid collection within the abdomen or pelvis. 3. Interval resolution of postoperative pneumoperitoneum and body wall subcutaneous emphysema. 4. Stable sequela of distal colonic primary anastomosis without evidence of enteric obstruction. 5. Extensive colonic diverticulosis without evidence superimposed acute diverticulitis. 6. Large colonic stool burden. 7. Punctate (2 mm) nonobstructing left-sided renal stone. 8. Mild nodularity of the hepatic contour as could be seen in the setting of early cirrhotic change. Correlation with LFTs is advised. 9.  Aortic Atherosclerosis (ICD10-I70.0). PLAN: Patient subsequently underwent percutaneous drainage catheter injection. Electronically Signed   By: Sandi Mariscal M.D.   On: 10/27/2019 11:06   CT ABDOMEN PELVIS W CONTRAST  Result Date: 10/13/2019 CLINICAL DATA:  74 year old male with concern for peritonitis and bowel perforation. Pneumoperitoneum seen on earlier radiograph. EXAM: CT  ABDOMEN AND PELVIS WITH CONTRAST TECHNIQUE: Multidetector CT imaging of the abdomen and pelvis was performed using the standard protocol following bolus administration of intravenous contrast. CONTRAST:  117m OMNIPAQUE IOHEXOL 300 MG/ML  SOLN COMPARISON:  Abdominal radiograph dated 10/13/2019 and CT dated 09/06/2019 FINDINGS: Lower chest: Minimal bibasilar dependent atelectasis/scarring or sequela of prior infectious process. This findings are similar to the CT of 09/06/2019. Advanced 3 vessel coronary vascular calcification. There is moderate pneumoperitoneum with the majority of the air in the upper abdomen beneath the diaphragm. Small perihepatic free fluid. Hepatobiliary: Probable background of fatty liver. No intrahepatic biliary ductal dilatation. Cholecystectomy. No retained calcified stone in the central CBD. Pancreas: Unremarkable. No pancreatic ductal dilatation or surrounding inflammatory changes. Spleen: Normal in size without focal abnormality. Adrenals/Urinary Tract: The adrenal glands are unremarkable. There is no hydronephrosis on either side. There is symmetric enhancement and excretion of contrast by both kidneys. There is a 9 cm left renal cyst. The visualized ureters and urinary bladder appear unremarkable. Stomach/Bowel: There is sigmoid diverticulosis with muscular hypertrophy. Postsurgical changes of the sigmoid colon with anastomotic suture. Extraluminal air noted adjacent to the sigmoid suture concerning for sutural dehiscence. There is extensive sigmoid diverticulosis and diffuse colonic diverticula. No definite active inflammation. There is no bowel obstruction. The appendix is surgically absent. Vascular/Lymphatic: Advanced aortoiliac atherosclerotic disease. The IVC is unremarkable. No portal venous gas. There is no adenopathy. Reproductive: The prostate and seminal vesicles are grossly unremarkable. Other: There is a 5.2 x 4.1 cm loculated fluid collection in the left lower abdomen  concerning for an infected collection or abscess, possibly sequela of prior sigmoid diverticulitis. Clinical correlation is recommended. Musculoskeletal: Degenerative changes of the spine. No acute osseous pathology. IMPRESSION: 1. Moderate pneumoperitoneum with the majority of the air in the upper abdomen beneath the diaphragm. A pocket of air noted adjacent to the sigmoid anastomotic suture suggests sutural dehiscence. 2. Extensive colonic diverticulosis. No bowel obstruction. 3. A 5.2 x 4.1 cm loculated fluid collection in the left lower abdomen concerning for an infected collection or abscess, possibly sequela of prior sigmoid diverticulitis. Clinical correlation is recommended. 4. Aortic Atherosclerosis (ICD10-I70.0). These results were called by telephone at the time of interpretation on 10/13/2019 at 3:37 pm to provider [facility] , who verbally acknowledged these results. Electronically Signed   By: AAnner CreteM.D.   On: 10/13/2019 15:50   IR Sinus/Fist Tube Chk-Non GI  Result Date: 10/27/2019 CLINICAL DATA:  History of low anterior resection for rectosigmoid carcinoma with contained anastomotic leak, ultimately requiring CT-guided placement of percutaneous catheter  on 10/18/2019 Kathlene Cote). CT scan performed earlier today demonstrates complete resolution of the pericolonic abscess. As such patient now presents for drainage catheter injection. Patient reports little to no output from the percutaneous drainage catheter for the past several days. EXAM: SINUS TRACT INJECTION/FISTULOGRAM COMPARISON:  CT abdomen and pelvis-earlier same day; 10/13/2019; CT-guided percutaneous catheter placement-10/18/2019 CONTRAST:  15 cc Omnipaque 300 - administered via the existing percutaneous drain. FLUOROSCOPY TIME:  42 seconds (14 mGy) TECHNIQUE: The patient was positioned supine on the fluoroscopy table. A preprocedural spot fluoroscopic image was obtained of the left lower abdomen/pelvis and the existing  percutaneous drainage catheter. Multiple spot fluoroscopic and radiographic images were obtained following the injection of a small amount of contrast via the existing percutaneous drainage catheter. Images reviewed and discussed with the patient. The decision was made to remove the percutaneous drainage catheter. As such, the external portion of the drainage catheter was cut and drainage catheters removed intact. A dressing was applied. The patient tolerated the procedure well without immediate postprocedural complication. FINDINGS: Preprocedural spot fluoroscopic image demonstrates a percutaneous drainage catheter overlying the left lower abdomen/pelvis. Excreted contrast from recent abdominal CT is seen within the urinary bladder. Contrast injection demonstrates opacification of decompressed abscess cavity with reflux of contrast along the catheter tract. There is no definitive communication between the decompressed abscess cavity and the adjacent intestines as confirmed following aspiration of the entirety of the injected enteric contrast. IMPRESSION: Drain injection is negative for the presence of an enteric fistula. Given lack of output from the drainage catheter for the past several days, resolution of the pericolonic abscess on preceding abdominal CT and injection being negative for the presence of an enteric fistula, the drainage catheter was removed at the patient's bedside without incident. PLAN: Patient instructed to maintain all follow-up appointments with his referring surgeon Windell Moment) as well as oncologist (Dr. Tasia Catchings), and may otherwise follow-up at the interventional radiology drain clinic a p.r.n. basis. Electronically Signed   By: Sandi Mariscal M.D.   On: 10/27/2019 12:11   DG Chest Port 1 View  Addendum Date: 10/13/2019   ADDENDUM REPORT: 10/13/2019 10:57 ADDENDUM: Free air versus is bowel related artifact beneath the LEFT hemidiaphragm was discussed with the referring physician is outlined  below. These results were called by telephone at the time of interpretation on 10/13/2019 at 10:57 am to provider [facility] CINTRON-DIAZ , who verbally acknowledged these results. Electronically Signed   By: Zetta Bills M.D.   On: 10/13/2019 10:57   Result Date: 10/13/2019 CLINICAL DATA:  Postop Port-A-Cath placement EXAM: PORTABLE CHEST 1 VIEW COMPARISON:  Chest abdomen and pelvis CT 09/06/2019 FINDINGS: RIGHT IJ Port-A-Cath terminates in the mid to distal superior vena cava. Cardiomediastinal contours are normal. Post median sternotomy and CABG. Lungs are clear.  No sign of pneumothorax. Lucency beneath the LEFT hemidiaphragm is not clearly within bowel loops and is seen extending laterally. No lucency beneath RIGHT hemidiaphragm. IMPRESSION: RIGHT IJ Port-A-Cath terminates in the mid to distal superior vena cava. No pneumothorax. Question of free air beneath the LEFT hemidiaphragm, potentially contained within loops of bowel though there are loops of bowel in this area neck can be visualized separate from the area of concern and extends more laterally than expected for bowel gas. Dedicated upright abdominal radiograph may be helpful. A call is out to the referring provider to further discuss findings in the above case. Electronically Signed: By: Zetta Bills M.D. On: 10/13/2019 10:47   DG Abd 2 Views  Result Date:  10/13/2019 CLINICAL DATA:  Question of free air on chest x-ray. EXAM: ABDOMEN - 2 VIEW COMPARISON:  Chest x-ray same date FINDINGS: There is lucency beneath RIGHT and LEFT hemidiaphragm. Median sternotomy changes are demonstrated. Opacity of gas in the abdomen within loops of bowel. Post cholecystectomy. Spinal degenerative changes as before. IMPRESSION: Lucency beneath the RIGHT and LEFT hemidiaphragm. This could be due to free air, the patient did undergo a surgical procedure greater than 2 weeks ago. Further evaluation with CT is recommended. No sign of bowel obstruction. These results were  called by telephone at the time of interpretation on 10/13/2019 at 12:50 pm to provider [facility] CINTRON-DIAZ , who verbally acknowledged these results. Electronically Signed   By: Zetta Bills M.D.   On: 10/13/2019 12:53   DG BE (COLON)W SINGLE CM (SOL OR THIN BA)  Result Date: 10/14/2019 CLINICAL DATA:  Large bowel anastomotic leak. EXAM: WATER SOLUBLE CONTRAST ENEMA TECHNIQUE: Standard water soluble contrast enema performed with multiple images obtained. FLUOROSCOPY TIME:  Fluoroscopy Time:  2 minutes 24 seconds Radiation Exposure Index (if provided by the fluoroscopic device): 125.6 mGy COMPARISON:  CT 10/13/2019. FINDINGS: Narrowing of the rectosigmoid colon the level of the anastomosis is noted. What appears to be a prominent contained leak noted at this level. Remainder of the colon is widely patent with scattered diverticuli. No mass lesion or obstructing lesion otherwise noted. Terminal ileum not identified. IMPRESSION: Narrowing of the recto sigmoid at the level of the anastomosis. What appears to be a prominent contained leak is noted at this level. These results will be called to the ordering clinician or representative by the Radiologist Assistant, and communication documented in the PACS or [employer]. Electronically Signed   By: Marcello Moores  Register   On: 10/14/2019 11:53   DG C-Arm 1-60 Min-No Report  Result Date: 10/13/2019 Fluoroscopy was utilized by the requesting physician.  No radiographic interpretation.   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 Contained anastomotic leak has been a covered by imaging with development a left lower quadrant pelvic fluid collection/abscess. The patient presents for drainage of the fluid collection. EXAM: CT GUIDED CATHETER DRAINAGE OF PERITONEAL ABSCESS ANESTHESIA/SEDATION: 2.0 mg IV Versed 100 mcg IV Fentanyl Total Moderate Sedation  Time:  20 minutes The patient's level of consciousness and physiologic status were continuously monitored during the procedure by Radiology nursing. PROCEDURE: The procedure, risks, benefits, and alternatives were explained to the patient. Questions regarding the procedure were encouraged and answered. The patient understands and consents to the procedure. A time out was performed prior to initiating the procedure. The left lateral abdominal wall was prepped with chlorhexidine in a sterile fashion, and a sterile drape was applied covering the operative field. A sterile gown and sterile gloves were used for the procedure. Local anesthesia was provided with 1% Lidocaine. CT was performed initially in a supine position followed by a slightly oblique position with the left side raised. From this position, an 18 gauge trocar needle was advanced from an anterolateral approach to the level of the left lower quadrant fluid collection. After confirming needle tip position, fluid was aspirated from the needle. A guidewire was advanced into the collection and the needle removed. The percutaneous tract was dilated and a 12 French percutaneous drainage catheter advanced over the guidewire. Catheter position was confirmed by CT. Additional fluid was aspirated from the catheter and a fluid sample sent for culture  analysis. The drainage catheter was flushed with sterile saline and attached to a suction bulb. The drain was secured at the skin with a Prolene retention suture and StatLock device. COMPLICATIONS: None FINDINGS: Very little anterior window was present to the level of the left-sided upper pelvic peritoneal fluid collection which by CT today measures approximately 4.3 x 5.3 cm. In a slightly oblique position with the left side raise, there was a much improved anterolateral window allowing needle advancement followed by drain placement. There was return of thick, tan colored fluid. A sample was sent for culture analysis.  After drain placement, there is good return of fluid with suction bulb drainage. IMPRESSION: CT-guided percutaneous catheter drainage of peritoneal abscess in the left upper pelvis with return of thick, tan fluid. A sample was sent for culture analysis. A 12 French drainage catheter was placed and attached to suction bulb drainage. Electronically Signed   By: Aletta Edouard M.D.   On: 10/18/2019 13:33      ASSESSMENT & PLAN:  1. Rectal cancer ([place])   2. Thrombocytopenia ([facility])   3. 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 Labs are reviewed and discussed with patient. Hold off treatment given that he has thrombocytopenia.  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  Hold off treatment today. Future treatment, I will continue to omit bolus 5-FU.  I will also decrease oxaliplatin to 75 mg/m in future treatments.  #Hypertension.  Blood pressure is high today.  He has not taken his blood pressure medication.  Advised patient to take blood pressure when he gets home and also monitor blood pressure at home..  -Renal cyst, patient will need MRI abdomen with and without contrast for further evaluation renal cyst in the future.  Will obtain prior to radiation.   All questions were answered. The patient knows to call the clinic with any problems questions or concerns.  Return of visit: 1 week  Earlie Server, MD, PhD Hematology Oncology [facility] at [facility] Pager- [phone] 12/29/2019

## 2019-12-29 NOTE — Progress Notes (Signed)
Per Benjamine Mola RN per Dr. Tasia Catchings, no treatment at this time. Pt stable at discharge.

## 2019-12-29 NOTE — Progress Notes (Signed)
Pt here for follow up. Reports like she has "no energy at all."

## 2019-12-31 ENCOUNTER — Inpatient Hospital Stay: Payer: Medicare Other

## 2020-01-05 ENCOUNTER — Other Ambulatory Visit: Payer: Self-pay

## 2020-01-05 ENCOUNTER — Inpatient Hospital Stay: Payer: Medicare Other

## 2020-01-05 ENCOUNTER — Inpatient Hospital Stay (HOSPITAL_BASED_OUTPATIENT_CLINIC_OR_DEPARTMENT_OTHER): Payer: Medicare Other | Admitting: Oncology

## 2020-01-05 ENCOUNTER — Encounter: Payer: Self-pay | Admitting: Oncology

## 2020-01-05 VITALS — BP 134/76 | HR 52 | Temp 97.5°F | Resp 18 | Wt 221.8 lb

## 2020-01-05 DIAGNOSIS — Z5111 Encounter for antineoplastic chemotherapy: Secondary | ICD-10-CM | POA: Diagnosis not present

## 2020-01-05 DIAGNOSIS — C20 Malignant neoplasm of rectum: Secondary | ICD-10-CM

## 2020-01-05 DIAGNOSIS — D696 Thrombocytopenia, unspecified: Secondary | ICD-10-CM | POA: Diagnosis not present

## 2020-01-05 LAB — COMPREHENSIVE METABOLIC PANEL
ALT: 23 U/L (ref 0–44)
AST: 33 U/L (ref 15–41)
Albumin: 3.6 g/dL (ref 3.5–5.0)
Alkaline Phosphatase: 155 U/L — ABNORMAL HIGH (ref 38–126)
Anion gap: 9 (ref 5–15)
BUN: 9 mg/dL (ref 8–23)
CO2: 24 mmol/L (ref 22–32)
Calcium: 8.7 mg/dL — ABNORMAL LOW (ref 8.9–10.3)
Chloride: 108 mmol/L (ref 98–111)
Creatinine, Ser: 0.88 mg/dL (ref 0.61–1.24)
GFR calc Af Amer: >60 mL/min (ref >60)
GFR calc non Af Amer: >60 mL/min (ref >60)
Glucose, Bld: 122 mg/dL — ABNORMAL HIGH (ref 70–99)
Potassium: 3.6 mmol/L (ref 3.5–5.1)
Sodium: 141 mmol/L (ref 135–145)
Total Bilirubin: 0.6 mg/dL (ref 0.3–1.2)
Total Protein: 7.1 g/dL (ref 6.5–8.1)

## 2020-01-05 LAB — CBC WITH DIFFERENTIAL/PLATELET
Abs Immature Granulocytes: 0.05 10*3/uL (ref 0.00–0.07)
Basophils Absolute: 0.1 10*3/uL (ref 0.0–0.1)
Basophils Relative: 2 %
Eosinophils Absolute: 0.2 10*3/uL (ref 0.0–0.5)
Eosinophils Relative: 3 %
HCT: 38.7 % — ABNORMAL LOW (ref 39.0–52.0)
Hemoglobin: 13.1 g/dL (ref 13.0–17.0)
Immature Granulocytes: 1 %
Lymphocytes Relative: 35 %
Lymphs Abs: 2.6 10*3/uL (ref 0.7–4.0)
MCH: 33.3 pg (ref 26.0–34.0)
MCHC: 33.9 g/dL (ref 30.0–36.0)
MCV: 98.5 fL (ref 80.0–100.0)
Monocytes Absolute: 1.4 10*3/uL — ABNORMAL HIGH (ref 0.1–1.0)
Monocytes Relative: 19 %
Neutro Abs: 3.1 10*3/uL (ref 1.7–7.7)
Neutrophils Relative %: 40 %
Platelets: 98 10*3/uL — ABNORMAL LOW (ref 150–400)
RBC: 3.93 MIL/uL — ABNORMAL LOW (ref 4.22–5.81)
RDW: 18.7 % — ABNORMAL HIGH (ref 11.5–15.5)
WBC: 7.5 10*3/uL (ref 4.0–10.5)
nRBC: 0 % (ref 0.0–0.2)

## 2020-01-05 MED ORDER — SODIUM CHLORIDE 0.9 % IV SOLN
10.0000 mg | Freq: Once | INTRAVENOUS | Status: AC
  Administered 2020-01-05: 10 mg via INTRAVENOUS
  Filled 2020-01-05: qty 10

## 2020-01-05 MED ORDER — DEXTROSE 5 % IV SOLN
Freq: Once | INTRAVENOUS | Status: AC
  Filled 2020-01-05: qty 250

## 2020-01-05 MED ORDER — SODIUM CHLORIDE 0.9% FLUSH
10.0000 mL | INTRAVENOUS | Status: DC | PRN
  Administered 2020-01-05: 10 mL
  Filled 2020-01-05: qty 10

## 2020-01-05 MED ORDER — SODIUM CHLORIDE 0.9% FLUSH
10.0000 mL | Freq: Once | INTRAVENOUS | Status: AC
  Administered 2020-01-05: 10 mL via INTRAVENOUS
  Filled 2020-01-05: qty 10

## 2020-01-05 MED ORDER — OXALIPLATIN CHEMO INJECTION 100 MG/20ML
75.0000 mg/m2 | Freq: Once | INTRAVENOUS | Status: AC
  Administered 2020-01-05: 165 mg via INTRAVENOUS
  Filled 2020-01-05: qty 33

## 2020-01-05 MED ORDER — LEUCOVORIN CALCIUM INJECTION 350 MG
900.0000 mg | Freq: Once | INTRAVENOUS | Status: AC
  Administered 2020-01-05: 900 mg via INTRAVENOUS
  Filled 2020-01-05: qty 45

## 2020-01-05 MED ORDER — SODIUM CHLORIDE 0.9 % IV SOLN
2400.0000 mg/m2 | INTRAVENOUS | Status: DC
  Administered 2020-01-05: 5350 mg via INTRAVENOUS
  Filled 2020-01-05: qty 107

## 2020-01-05 MED ORDER — PALONOSETRON HCL INJECTION 0.25 MG/5ML
0.2500 mg | Freq: Once | INTRAVENOUS | Status: AC
  Administered 2020-01-05: 0.25 mg via INTRAVENOUS
  Filled 2020-01-05: qty 5

## 2020-01-05 NOTE — Progress Notes (Addendum)
.  Hematology/Oncology follow up  note [facility] [phone] [phone]   Patient Care Team: Albina Billet, MD as PCP - General (Internal Medicine) Clent Jacks, RN as Oncology Nurse Navigator Earlie Server, MD as Consulting Physician (Oncology)  REFERRING PROVIDER: Albina Billet, MD  CHIEF COMPLAINTS/REASON FOR VISIT:  Follow-up for colorectal cancer  HISTORY OF PRESENTING ILLNESS:   Garrett Peterson is a  74 y.o.  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 Alice Reichert for follow-up on history of colon polyps.  Patient also has reported recent history of change of bowel habits.  He has felt more constipated. No blood in the stool, abdominal pain, unintentional weight loss or fever or chills. Colonoscopy records is at [facility] clinic and is not available to me. 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  MMR is deferred to resection specimen.  # 09/20/2019, patient underwent robotic assisted laparoscopic low anterior resection. Pathology report was reviewed by me and discussed with patient and his wife. I also called pathology and discussed with Dr. Reuel Derby. The tumor is located 1.5 cm distal to the rectosigmoid junction.  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  I explained to patient the different final diagnosis of cancer of rectum rather than endoscopic impression of distal sigmoid colon cancer.  Tumor was not visualized on preop CT scan.  -pT3 pN1 M0, grade 2 adenocarcinoma. MMR status is intact  All margins were uninvolved by invasive carcinoma, high-grade dysplasia/intramucosal adenocarcinoma and low-grade dysplastic.  1 out of 8 regional lymph nodes were positive for malignancy. Patient has lymphovascular  invasion as well as perineural invasion. I discussed with patient that ideally his rectal cancer would be better treated with total neoadjuvant treatment prior to the surgery if we would know his final diagnosis will be an upper rectal cancer.  Preoperative treatments is associated with a more favorable long-term toxicity profile and fever local recurrence then postoperative therapy.  However overall survival appears to be similar.In this case, I would recommend chemotherapy and adjuvant chemo- radiation  # 10/13/2019 patient had CT abdomen pelvis with contrast for evaluation of concern of peritonitis and bowel perforation.  Pneumoperitoneum was seen on earlier radiograph.  There was moderate pneumoperitoneum, extensive colonic diverticulosis.  5.2 x 4.1 cm loculated fluid collection in the left lower abdomen concerning for an infected collection or abscess. Patient had a drain placed and finished a course of antibiotics.  Draining catheter was pulled.  #11/02/2019, patient started adjuvant FOLFOX treatments.  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  Discussed with the patient and encourage him to further discuss with primary care provider for the need of see pulmonology  INTERVAL HISTORY Garrett Peterson is a 74 y.o. male who has above history reviewed by me today presents for follow up visit for management of rectal cancer. Problems and complaints are listed below: Patient is on adjuvant FOLFOX.   Clinically he tolerates well.  Denies any neuropathy symptoms.  His chemotherapy was held due to thrombocytopenia last week.  He has no new complaints today. . Review of Systems  Constitutional: Negative for appetite change, chills, fatigue, fever and unexpected weight change.  HENT:   Negative for hearing loss and voice change.   Eyes: Negative for eye problems  and icterus.  Respiratory: Negative for chest tightness, cough and shortness of breath.     Cardiovascular: Negative for chest pain and leg swelling.  Gastrointestinal: Negative for abdominal distention and abdominal pain.  Endocrine: Negative for hot flashes.  Genitourinary: Negative for difficulty urinating, dysuria and frequency.   Musculoskeletal: Negative for arthralgias.  Skin: Negative for itching and rash.  Neurological: Negative for light-headedness and numbness.  Hematological: Negative for adenopathy. Does not bruise/bleed easily.  Psychiatric/Behavioral: Negative for confusion.    MEDICAL HISTORY:  Past Medical History:  Diagnosis Date  . Actinic keratosis   . Arthritis   . Basal cell carcinoma   . CAD (coronary artery disease)   . Cancer ([place]) 2021   rectal  . Hyperlipidemia   . Pre-diabetes     SURGICAL HISTORY: Past Surgical History:  Procedure Laterality Date  . APPENDECTOMY    . CARDIAC CATHETERIZATION    . COLONOSCOPY    . CORONARY ARTERY BYPASS GRAFT    . IR SINUS/FIST TUBE CHK-NON GI  10/27/2019  . open heart surgery    . PORTACATH PLACEMENT Right 10/13/2019   Procedure: INSERTION PORT-A-CATH;  Surgeon: Herbert Pun, MD;  Location: [facility] ORS;  Service: General;  Laterality: Right;    SOCIAL HISTORY: Social History   Socioeconomic History  . Marital status: Married    Spouse name: Not on file  . Number of children: Not on file  . Years of education: Not on file  . Highest education level: Not on file  Occupational History  . Not on file  Tobacco Use  . Smoking status: Never Smoker  . Smokeless tobacco: Never Used  Vaping Use  . Vaping Use: Never used  Substance and Sexual Activity  . Alcohol use: Not Currently    Alcohol/week: 0.0 standard drinks  . Drug use: Never  . Sexual activity: Not Currently  Other Topics Concern  . Not on file  Social History Narrative  . Not on file   Social Determinants of Health   Financial Resource Strain:   . Difficulty of Paying Living Expenses: Not on file  Food Insecurity:   .  Worried About Charity fundraiser in the Last Year: Not on file  . Ran Out of Food in the Last Year: Not on file  Transportation Needs:   . Lack of Transportation (Medical): Not on file  . Lack of Transportation (Non-Medical): Not on file  Physical Activity:   . Days of Exercise per Week: Not on file  . Minutes of Exercise per Session: Not on file  Stress:   . Feeling of Stress : Not on file  Social Connections:   . Frequency of Communication with Friends and Family: Not on file  . Frequency of Social Gatherings with Friends and Family: Not on file  . Attends Religious Services: Not on file  . Active Member of Clubs or Organizations: Not on file  . Attends Archivist Meetings: Not on file  . Marital Status: Not on file  Intimate Partner Violence:   . Fear of Current or Ex-Partner: Not on file  . Emotionally Abused: Not on file  . Physically Abused: Not on file  . Sexually Abused: Not on file    FAMILY HISTORY: Family History  Problem Relation Age of Onset  . Lung disease Father     ALLERGIES:  has No Known Allergies.  MEDICATIONS:  Current Outpatient Medications  Medication Sig Dispense Refill  . atorvastatin (LIPITOR) 20 MG tablet Take 20  mg by mouth every evening.     . lidocaine-prilocaine (EMLA) cream Apply to affected area once 30 g 3  . metoprolol tartrate (LOPRESSOR) 25 MG tablet Take 25 mg by mouth 2 (two) times daily.    . polyethylene glycol (MIRALAX) 17 g packet Take 17 g by mouth daily as needed for severe constipation. 20 each 1  . prochlorperazine (COMPAZINE) 10 MG tablet Take 1 tablet (10 mg total) by mouth every 6 (six) hours as needed (Nausea or vomiting). 30 tablet 1  . senna (SENOKOT) 8.6 MG TABS tablet Take 2 tablets (17.2 mg total) by mouth daily. 120 tablet 1  . temazepam (RESTORIL) 30 MG capsule Take 30 mg by mouth at bedtime.      No current facility-administered medications for this visit.     PHYSICAL EXAMINATION: ECOG PERFORMANCE  STATUS: 0 - Asymptomatic There were no vitals filed for this visit. There were no vitals filed for this visit.  Physical Exam Constitutional:      General: He is not in acute distress. HENT:     Head: Normocephalic and atraumatic.  Eyes:     General: No scleral icterus. Cardiovascular:     Rate and Rhythm: Normal rate and regular rhythm.     Heart sounds: Normal heart sounds.  Pulmonary:     Effort: Pulmonary effort is normal. No respiratory distress.     Breath sounds: No wheezing.  Abdominal:     General: Bowel sounds are normal. There is no distension.     Palpations: Abdomen is soft.  Musculoskeletal:        General: No deformity. Normal range of motion.     Cervical back: Normal range of motion and neck supple.  Skin:    General: Skin is warm and dry.     Findings: No erythema or rash.     Comments: Right anterior chest wall + Mediport  Neurological:     Mental Status: He is alert and oriented to person, place, and time. Mental status is at baseline.     Cranial Nerves: No cranial nerve deficit.     Coordination: Coordination normal.  Psychiatric:        Mood and Affect: Mood normal.     LABORATORY DATA:  I have reviewed the data as listed Lab Results  Component Value Date   WBC 16.1 (H) 12/29/2019   HGB 12.7 (L) 12/29/2019   HCT 37.3 (L) 12/29/2019   MCV 96.6 12/29/2019   PLT 65 (L) 12/29/2019   Recent Labs    12/08/19 0832 12/15/19 0812 12/29/19 0810  NA 139 139 141  K 3.9 3.4* 3.0*  CL 106 105 107  CO2 _0 GLUCOSE 133* 136* 158*  BUN _1 CREATININE 0.91 0.89 0.87  CALCIUM 8.7* 8.8* 8.7*  GFRNONAA >60 >60 >60  GFRAA >60 >60 >60  PROT 6.9 7.2 7.0  ALBUMIN 3.8 3.7 3.6  AST 36 31 46*  ALT 40 32 33  ALKPHOS 100 116 184*  BILITOT 0.6 0.7 0.6   Iron/TIBC/Ferritin/ %Sat    Component Value Date/Time   IRON 100 09/06/2019 1200   TIBC 368 09/06/2019 1200   FERRITIN 198 09/06/2019 1200   IRONPCTSAT 27 09/06/2019 1200      RADIOGRAPHIC  STUDIES: I have personally reviewed the radiological images as listed and agreed with the findings in the report. CT ABDOMEN PELVIS W CONTRAST  Result Date: 10/27/2019 CLINICAL DATA:  History of low anterior resection to remove  a rectosigmoid carcinoma with contained anastomotic leak ultimately requiring CT-guided placement of a percutaneous drainage catheter placement on 10/18/2019 Patient presents today for abdominal CT and potential drainage catheter injection. Patient reports little to no output from the percutaneous catheter for the past several days. EXAM: CT ABDOMEN AND PELVIS WITH CONTRAST TECHNIQUE: Multidetector CT imaging of the abdomen and pelvis was performed using the standard protocol following bolus administration of intravenous contrast. CONTRAST:  114m OMNIPAQUE IOHEXOL 300 MG/ML  SOLN COMPARISON:  CT abdomen and pelvis-10/13/2019; CT-guided left lower abdominal/pelvic percutaneous drainage catheter placement-10/18/2019 FINDINGS: Lower chest: Limited visualization of the lower thorax redemonstrates minimal bibasilar reticular and ground-glass opacities with subpleural sparing. No discrete focal airspace opacities. No pleural effusion. Cardiomegaly. Post median sternotomy. Calcifications within native coronary arteries. No pericardial effusion. Hepatobiliary: Mild nodularity hepatic contour. There is a minimal amount of focal fatty infiltration adjacent to the fissure for the ligamentum teres. No discrete hepatic lesions. There is a punctate granuloma within the dome of the right lobe of the liver. Post cholecystectomy. No intra extrahepatic bili duct dilatation. No ascites. Pancreas: Normal appearance of the pancreas. Spleen: Normal appearance of the spleen. Adrenals/Urinary Tract: There is symmetric enhancement and excretion of the bilateral kidneys. There is a punctate (2 mm) nonobstructing stone within the inferior pole the left kidney (image 50, series 6). No evidence of right-sided  nephrolithiasis on this postcontrast examination. Note is made of an approximately 8.4 cm hypoattenuating nonenhancing exophytic cyst arising from the anterior superior pole of the left kidney (image 36, series 3). There is an approximately 0.8 cm hyperattenuating nonenhancing partially exophytic lesion arising from the inferior pole the left kidney (axial image 51, series 3; image 31, series 8; coronal image 49, series 6), which is too small to accurately characterize though may represent a hyperdense renal cyst. No discrete right-sided renal lesions. There is a minimal amount of likely age and body habitus related bilateral symmetric perinephric stranding. No evidence of urinary obstruction. Normal appearance the bilateral adrenal glands. Normal appearance of the urinary bladder given degree of distention. Stomach/Bowel: Stable positioning of left lower abdominal/pelvic percutaneous drainage catheter with resolution of previously noted pericolonic abscess. There is a minimal amount of ill-defined stranding about the descending colon with minimal amount of free fluid in the pelvic cul-de-sac but without definable/drainable fluid collection within the abdomen or pelvis. Interval resolution of postoperative pneumoperitoneum. Stable primary colonic anastomosis within the distal sigmoid/rectum. Large colonic stool burden without evidence of enteric obstruction. Normal appearance of the terminal ileum and appendix. No discrete areas of bowel wall thickening. No pneumoperitoneum, pneumatosis or portal venous gas. Vascular/Lymphatic: Moderate to large amount of mixed calcified and noncalcified atherosclerotic plaque within normal caliber abdominal aorta. The major branch vessels of the abdominal aorta appear patent on this non CTA examination. No bulky retroperitoneal, mesenteric, pelvic or inguinal lymphadenopathy. Reproductive: Dystrophic calcifications within normal sized prostate gland. There is a small amount of  likely reactive free fluid in the pelvic cul-de-sac. Other: Interval resolution of previously noted left lateral abdominal wall subcutaneous emphysema. Small bilateral mesenteric fat containing inguinal hernias. Linear areas of subcutaneous stranding involving the ventral aspect of the abdominal wall likely represent laparotomy ports (axial images 24, 27, 32 and 46). No associated hernia. There is a minimal amount of subcutaneous edema about the midline of the low back. Musculoskeletal: No acute or aggressive osseous abnormalities. 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 Moderate bilateral facet degenerative change of the lower lumbar spine. Moderate degenerative change the bilateral hips with joint space loss, subchondral sclerosis and osteophytosis. IMPRESSION: 1. Complete resolution of left lower abdominal/pelvic abscess following percutaneous drainage catheter placement. 2. There is a minimal amount of mesenteric stranding about the descending colon and ill-defined free fluid in the pelvic cul-de-sac without new definable/drainable fluid collection within the abdomen or pelvis. 3. Interval resolution of postoperative pneumoperitoneum and body wall subcutaneous emphysema. 4. Stable sequela of distal colonic primary anastomosis without evidence of enteric obstruction. 5. Extensive colonic diverticulosis without evidence superimposed acute diverticulitis. 6. Large colonic stool burden. 7. Punctate (2 mm) nonobstructing left-sided renal stone. 8. Mild nodularity of the hepatic contour as could be seen in the setting of early cirrhotic change. Correlation with LFTs is advised. 9.  Aortic Atherosclerosis (ICD10-I70.0). PLAN: Patient subsequently underwent percutaneous drainage catheter injection. Electronically Signed   By: Sandi Mariscal M.D.   On: 10/27/2019 11:06   CT ABDOMEN PELVIS W  CONTRAST  Result Date: 10/13/2019 CLINICAL DATA:  74 year old male with concern for peritonitis and bowel perforation. Pneumoperitoneum seen on earlier radiograph. EXAM: CT ABDOMEN AND PELVIS WITH CONTRAST TECHNIQUE: Multidetector CT imaging of the abdomen and pelvis was performed using the standard protocol following bolus administration of intravenous contrast. CONTRAST:  147m OMNIPAQUE IOHEXOL 300 MG/ML  SOLN COMPARISON:  Abdominal radiograph dated 10/13/2019 and CT dated 09/06/2019 FINDINGS: Lower chest: Minimal bibasilar dependent atelectasis/scarring or sequela of prior infectious process. This findings are similar to the CT of 09/06/2019. Advanced 3 vessel coronary vascular calcification. There is moderate pneumoperitoneum with the majority of the air in the upper abdomen beneath the diaphragm. Small perihepatic free fluid. Hepatobiliary: Probable background of fatty liver. No intrahepatic biliary ductal dilatation. Cholecystectomy. No retained calcified stone in the central CBD. Pancreas: Unremarkable. No pancreatic ductal dilatation or surrounding inflammatory changes. Spleen: Normal in size without focal abnormality. Adrenals/Urinary Tract: The adrenal glands are unremarkable. There is no hydronephrosis on either side. There is symmetric enhancement and excretion of contrast by both kidneys. There is a 9 cm left renal cyst. The visualized ureters and urinary bladder appear unremarkable. Stomach/Bowel: There is sigmoid diverticulosis with muscular hypertrophy. Postsurgical changes of the sigmoid colon with anastomotic suture. Extraluminal air noted adjacent to the sigmoid suture concerning for sutural dehiscence. There is extensive sigmoid diverticulosis and diffuse colonic diverticula. No definite active inflammation. There is no bowel obstruction. The appendix is surgically absent. Vascular/Lymphatic: Advanced aortoiliac atherosclerotic disease. The IVC is unremarkable. No portal venous gas. There is no  adenopathy. Reproductive: The prostate and seminal vesicles are grossly unremarkable. Other: There is a 5.2 x 4.1 cm loculated fluid collection in the left lower abdomen concerning for an infected collection or abscess, possibly sequela of prior sigmoid diverticulitis. Clinical correlation is recommended. Musculoskeletal: Degenerative changes of the spine. No acute osseous pathology. IMPRESSION: 1. Moderate pneumoperitoneum with the majority of the air in the upper abdomen beneath the diaphragm. A pocket of air noted adjacent to the sigmoid anastomotic suture suggests sutural dehiscence. 2. Extensive colonic diverticulosis. No bowel obstruction. 3. A 5.2 x 4.1 cm loculated fluid collection in the left lower abdomen concerning for an infected collection or abscess, possibly sequela of prior sigmoid diverticulitis. Clinical correlation is recommended. 4. Aortic Atherosclerosis (ICD10-I70.0). These results were called by telephone at the time of interpretation on 10/13/2019 at 3:37 pm to [facility] , who verbally acknowledged these results. Electronically Signed   By: ALaren EvertsD.  On: 10/13/2019 15:50   IR Sinus/Fist Tube Chk-Non GI  Result Date: 10/27/2019 CLINICAL DATA:  History of low anterior resection for rectosigmoid carcinoma with contained anastomotic leak, ultimately requiring CT-guided placement of percutaneous catheter on 10/18/2019 Kathlene Cote). CT scan performed earlier today demonstrates complete resolution of the pericolonic abscess. As such patient now presents for drainage catheter injection. Patient reports little to no output from the percutaneous drainage catheter for the past several days. EXAM: SINUS TRACT INJECTION/FISTULOGRAM COMPARISON:  CT abdomen and pelvis-earlier same day; 10/13/2019; CT-guided percutaneous catheter placement-10/18/2019 CONTRAST:  15 cc Omnipaque 300 - administered via the existing percutaneous drain. FLUOROSCOPY TIME:  42 seconds (14 mGy)  TECHNIQUE: The patient was positioned supine on the fluoroscopy table. A preprocedural spot fluoroscopic image was obtained of the left lower abdomen/pelvis and the existing percutaneous drainage catheter. Multiple spot fluoroscopic and radiographic images were obtained following the injection of a small amount of contrast via the existing percutaneous drainage catheter. Images reviewed and discussed with the patient. The decision was made to remove the percutaneous drainage catheter. As such, the external portion of the drainage catheter was cut and drainage catheters removed intact. A dressing was applied. The patient tolerated the procedure well without immediate postprocedural complication. FINDINGS: Preprocedural spot fluoroscopic image demonstrates a percutaneous drainage catheter overlying the left lower abdomen/pelvis. Excreted contrast from recent abdominal CT is seen within the urinary bladder. Contrast injection demonstrates opacification of decompressed abscess cavity with reflux of contrast along the catheter tract. There is no definitive communication between the decompressed abscess cavity and the adjacent intestines as confirmed following aspiration of the entirety of the injected enteric contrast. IMPRESSION: Drain injection is negative for the presence of an enteric fistula. Given lack of output from the drainage catheter for the past several days, resolution of the pericolonic abscess on preceding abdominal CT and injection being negative for the presence of an enteric fistula, the drainage catheter was removed at the patient's bedside without incident. PLAN: Patient instructed to maintain all follow-up appointments with his referring surgeon Windell Moment) as well as oncologist (Dr. Tasia Catchings), and may otherwise follow-up at the interventional radiology drain clinic a p.r.n. basis. Electronically Signed   By: Sandi Mariscal M.D.   On: 10/27/2019 12:11   DG Chest Port 1 View  Addendum Date: 10/13/2019     ADDENDUM REPORT: 10/13/2019 10:57 ADDENDUM: Free air versus is bowel related artifact beneath the LEFT hemidiaphragm was discussed with the referring physician is outlined below. These results were called by telephone at the time of interpretation on 10/13/2019 at 10:57 am to provider [facility] CINTRON-DIAZ , who verbally acknowledged these results. Electronically Signed   By: Zetta Bills M.D.   On: 10/13/2019 10:57   Result Date: 10/13/2019 CLINICAL DATA:  Postop Port-A-Cath placement EXAM: PORTABLE CHEST 1 VIEW COMPARISON:  Chest abdomen and pelvis CT 09/06/2019 FINDINGS: RIGHT IJ Port-A-Cath terminates in the mid to distal superior vena cava. Cardiomediastinal contours are normal. Post median sternotomy and CABG. Lungs are clear.  No sign of pneumothorax. Lucency beneath the LEFT hemidiaphragm is not clearly within bowel loops and is seen extending laterally. No lucency beneath RIGHT hemidiaphragm. IMPRESSION: RIGHT IJ Port-A-Cath terminates in the mid to distal superior vena cava. No pneumothorax. Question of free air beneath the LEFT hemidiaphragm, potentially contained within loops of bowel though there are loops of bowel in this area neck can be visualized separate from the area of concern and extends more laterally than expected for bowel gas. Dedicated upright abdominal radiograph may  be helpful. A call is out to the referring provider to further discuss findings in the above case. Electronically Signed: By: Zetta Bills M.D. On: 10/13/2019 10:47   DG Abd 2 Views  Result Date: 10/13/2019 CLINICAL DATA:  Question of free air on chest x-ray. EXAM: ABDOMEN - 2 VIEW COMPARISON:  Chest x-ray same date FINDINGS: There is lucency beneath RIGHT and LEFT hemidiaphragm. Median sternotomy changes are demonstrated. Opacity of gas in the abdomen within loops of bowel. Post cholecystectomy. Spinal degenerative changes as before. IMPRESSION: Lucency beneath the RIGHT and LEFT hemidiaphragm. This could be due to  free air, the patient did undergo a surgical procedure greater than 2 weeks ago. Further evaluation with CT is recommended. No sign of bowel obstruction. These results were called by telephone at the time of interpretation on 10/13/2019 at 12:50 pm to provider [facility] CINTRON-DIAZ , who verbally acknowledged these results. Electronically Signed   By: Zetta Bills M.D.   On: 10/13/2019 12:53   DG BE (COLON)W SINGLE CM (SOL OR THIN BA)  Result Date: 10/14/2019 CLINICAL DATA:  Large bowel anastomotic leak. EXAM: WATER SOLUBLE CONTRAST ENEMA TECHNIQUE: Standard water soluble contrast enema performed with multiple images obtained. FLUOROSCOPY TIME:  Fluoroscopy Time:  2 minutes 24 seconds Radiation Exposure Index (if provided by the fluoroscopic device): 125.6 mGy COMPARISON:  CT 10/13/2019. FINDINGS: Narrowing of the rectosigmoid colon the level of the anastomosis is noted. What appears to be a prominent contained leak noted at this level. Remainder of the colon is widely patent with scattered diverticuli. No mass lesion or obstructing lesion otherwise noted. Terminal ileum not identified. IMPRESSION: Narrowing of the recto sigmoid at the level of the anastomosis. What appears to be a prominent contained leak is noted at this level. These results will be called to the ordering clinician or representative by the Radiologist Assistant, and communication documented in the PACS or [employer]. Electronically Signed   By: Marcello Moores  Register   On: 10/14/2019 11:53   DG C-Arm 1-60 Min-No Report  Result Date: 10/13/2019 Fluoroscopy was utilized by the requesting physician.  No radiographic interpretation.   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 Contained anastomotic leak has been a covered by imaging with development a left lower quadrant pelvic fluid collection/abscess. The patient presents for  drainage of the fluid collection. EXAM: CT GUIDED CATHETER DRAINAGE OF PERITONEAL ABSCESS ANESTHESIA/SEDATION: 2.0 mg IV Versed 100 mcg IV Fentanyl Total Moderate Sedation Time:  20 minutes The patient's level of consciousness and physiologic status were continuously monitored during the procedure by Radiology nursing. PROCEDURE: The procedure, risks, benefits, and alternatives were explained to the patient. Questions regarding the procedure were encouraged and answered. The patient understands and consents to the procedure. A time out was performed prior to initiating the procedure. The left lateral abdominal wall was prepped with chlorhexidine in a sterile fashion, and a sterile drape was applied covering the operative field. A sterile gown and sterile gloves were used for the procedure. Local anesthesia was provided with 1% Lidocaine. CT was performed initially in a supine position followed by a slightly oblique position with the left side raised. From this position, an 18 gauge trocar needle was advanced from an anterolateral approach to the level of the left lower quadrant fluid collection. After confirming needle tip position, fluid was aspirated from the needle. A guidewire was advanced into the collection and the needle  removed. The percutaneous tract was dilated and a 12 French percutaneous drainage catheter advanced over the guidewire. Catheter position was confirmed by CT. Additional fluid was aspirated from the catheter and a fluid sample sent for culture analysis. The drainage catheter was flushed with sterile saline and attached to a suction bulb. The drain was secured at the skin with a Prolene retention suture and StatLock device. COMPLICATIONS: None FINDINGS: Very little anterior window was present to the level of the left-sided upper pelvic peritoneal fluid collection which by CT today measures approximately 4.3 x 5.3 cm. In a slightly oblique position with the left side raise, there was a much  improved anterolateral window allowing needle advancement followed by drain placement. There was return of thick, tan colored fluid. A sample was sent for culture analysis. After drain placement, there is good return of fluid with suction bulb drainage. IMPRESSION: CT-guided percutaneous catheter drainage of peritoneal abscess in the left upper pelvis with return of thick, tan fluid. A sample was sent for culture analysis. A 12 French drainage catheter was placed and attached to suction bulb drainage. Electronically Signed   By: Aletta Edouard M.D.   On: 10/18/2019 13:33      ASSESSMENT & PLAN:  1. Rectal cancer ([place])   2. 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 Labs are reviewed and discussed with patient. Proceed with treatment today. Oxaliplatin is reduced to 75 mg/m2, omit bolus 5-FU  #Chemotherapy-induced thrombocytopenia, platelet count has improved to 98,000.  Proceed with treatment. #Hypertension, blood pressure has improved today at 134/76.  #Renal cyst, in the future he will need MRI abdomen with and without contrast for further evaluation of the renal cyst. Discussed with patient about CDC recommendation of third dose of Covid vaccination for immunocompromise patient.  He agrees with plan and will to clinical pharmacist to get third dose of Covid vaccination. All questions were answered. The patient knows to call the clinic with any problems questions or concerns.  Return of visit: 2 weeks  Earlie Server, MD, PhD Hematology Oncology [facility] [place] Pager- [phone] 01/05/2020

## 2020-01-06 ENCOUNTER — Other Ambulatory Visit: Payer: Self-pay

## 2020-01-06 ENCOUNTER — Ambulatory Visit (INDEPENDENT_AMBULATORY_CARE_PROVIDER_SITE_OTHER): Payer: Medicare Other | Admitting: Dermatology

## 2020-01-06 DIAGNOSIS — Z85828 Personal history of other malignant neoplasm of skin: Secondary | ICD-10-CM

## 2020-01-06 DIAGNOSIS — Z1283 Encounter for screening for malignant neoplasm of skin: Secondary | ICD-10-CM

## 2020-01-06 DIAGNOSIS — D229 Melanocytic nevi, unspecified: Secondary | ICD-10-CM

## 2020-01-06 DIAGNOSIS — L814 Other melanin hyperpigmentation: Secondary | ICD-10-CM

## 2020-01-06 DIAGNOSIS — D1800 Hemangioma unspecified site: Secondary | ICD-10-CM

## 2020-01-06 DIAGNOSIS — L720 Epidermal cyst: Secondary | ICD-10-CM | POA: Diagnosis not present

## 2020-01-06 DIAGNOSIS — L578 Other skin changes due to chronic exposure to nonionizing radiation: Secondary | ICD-10-CM

## 2020-01-06 DIAGNOSIS — Z85048 Personal history of other malignant neoplasm of rectum, rectosigmoid junction, and anus: Secondary | ICD-10-CM | POA: Diagnosis not present

## 2020-01-06 DIAGNOSIS — L821 Other seborrheic keratosis: Secondary | ICD-10-CM

## 2020-01-06 LAB — CEA: CEA: 6.5 ng/mL — ABNORMAL HIGH (ref 0.0–4.7)

## 2020-01-06 NOTE — Progress Notes (Signed)
   Follow-Up Visit   Subjective  Garrett Peterson is a 74 y.o. male who presents for the following: Annual Exam (1 year f/u TBSE, hx of BCC, ). Pt was recently diagnosed with rectal cancer, he is treating with Chemo.  The patient presents for Total-Body Skin Exam (TBSE) for skin cancer screening and mole check.  The following portions of the chart were reviewed this encounter and updated as appropriate:  Tobacco  Allergies  Meds  Problems  Med Hx  Surg Hx  Fam Hx     Review of Systems:  No other skin or systemic complaints except as noted in HPI or Assessment and Plan.  Objective  Well appearing patient in no apparent distress; mood and affect are within normal limits.  A full examination was performed including scalp, head, eyes, ears, nose, lips, neck, chest, axillae, abdomen, back, buttocks, bilateral upper extremities, bilateral lower extremities, hands, feet, fingers, toes, fingernails, and toenails. All findings within normal limits unless otherwise noted below.  Objective  R ear: Well healed scar with no evidence of recurrence.   Objective  Right Upper Back, R lateral canthus: Subcutaneous nodule.   Objective  rectal: Hx of rectal cancer diagnosed early this year    Assessment & Plan  History of basal cell carcinoma (BCC) R ear Clear. Observe for recurrence. Call clinic for new or changing lesions.  Recommend regular skin exams, daily broad-spectrum spf 30+ sunscreen use, and photoprotection.    Epidermal inclusion cyst Right Upper Back, R lateral canthus Recheck in 6 months may consider removal after Chemo treatments for hx of rectal cancer   History of rectal cancer rectal Cont Chemo treatments as directed by oncology    Lentigines - Scattered tan macules - Discussed due to sun exposure - Benign, observe - Call for any changes  Seborrheic Keratoses - Stuck-on, waxy, tan-brown papules and plaques  - Discussed benign etiology and prognosis. -  Observe - Call for any changes  Melanocytic Nevi - Tan-brown and/or pink-flesh-colored symmetric macules and papules - Benign appearing on exam today - Observation - Call clinic for new or changing moles - Recommend daily use of broad spectrum spf 30+ sunscreen to sun-exposed areas.   Hemangiomas - Red papules - Discussed benign nature - Observe - Call for any changes  Actinic Damage - diffuse scaly erythematous macules with underlying dyspigmentation - Recommend daily broad spectrum sunscreen SPF 30+ to sun-exposed areas, reapply every 2 hours as needed.  - Call for new or changing lesions.  Skin cancer screening performed today.  Return in about 6 months (around 07/08/2020) for recheck cyst .  I, Marye Round, CMA, am acting as scribe for Sarina Ser, MD .  Documentation: I have reviewed the above documentation for accuracy and completeness, and I agree with the above.  Sarina Ser, MD

## 2020-01-07 ENCOUNTER — Inpatient Hospital Stay: Payer: Medicare Other

## 2020-01-07 VITALS — BP 156/69 | HR 61 | Temp 97.7°F | Resp 16

## 2020-01-07 DIAGNOSIS — Z5111 Encounter for antineoplastic chemotherapy: Secondary | ICD-10-CM | POA: Diagnosis not present

## 2020-01-07 DIAGNOSIS — C20 Malignant neoplasm of rectum: Secondary | ICD-10-CM

## 2020-01-07 MED ORDER — HEPARIN SOD (PORK) LOCK FLUSH 100 UNIT/ML IV SOLN
500.0000 [IU] | Freq: Once | INTRAVENOUS | Status: AC | PRN
  Administered 2020-01-07: 500 [IU]
  Filled 2020-01-07: qty 5

## 2020-01-07 MED ORDER — HEPARIN SOD (PORK) LOCK FLUSH 100 UNIT/ML IV SOLN
INTRAVENOUS | Status: AC
  Filled 2020-01-07: qty 5

## 2020-01-07 MED ORDER — SODIUM CHLORIDE 0.9% FLUSH
10.0000 mL | INTRAVENOUS | Status: DC | PRN
  Administered 2020-01-07: 10 mL
  Filled 2020-01-07: qty 10

## 2020-01-07 MED ORDER — PEGFILGRASTIM 6 MG/0.6ML ~~LOC~~ PSKT
6.0000 mg | PREFILLED_SYRINGE | Freq: Once | SUBCUTANEOUS | Status: AC
  Administered 2020-01-07: 6 mg via SUBCUTANEOUS
  Filled 2020-01-07: qty 0.6

## 2020-01-10 ENCOUNTER — Telehealth: Payer: Self-pay

## 2020-01-10 NOTE — Telephone Encounter (Signed)
-----  Message from Earlie Server, MD sent at 01/08/2020 10:05 AM EDT ----- Please send omniseq on [id number] Rectal cancer. Thanks.

## 2020-01-10 NOTE — Telephone Encounter (Signed)
Omniseq order faxed to [facility] pathology.

## 2020-01-14 ENCOUNTER — Encounter: Payer: Self-pay | Admitting: Dermatology

## 2020-01-18 ENCOUNTER — Ambulatory Visit: Payer: Self-pay | Attending: Internal Medicine

## 2020-01-18 DIAGNOSIS — Z23 Encounter for immunization: Secondary | ICD-10-CM

## 2020-01-18 NOTE — Progress Notes (Signed)
   Covid-19 Vaccination Clinic  Name:  Garrett Peterson    MRN: [id number] DOB: 08/06/1945  01/18/2020  [name] was observed post Covid-19 immunization for 15 minutes without incident. He was provided with Vaccine Information Sheet and instruction to access the V-Safe system.   [name] was instructed to call 911 with any severe reactions post vaccine: [name] Kitchen Difficulty breathing  . Swelling of face and throat  . A fast heartbeat  . A bad rash all over body  . Dizziness and weakness

## 2020-01-19 ENCOUNTER — Inpatient Hospital Stay: Payer: Medicare Other | Attending: Oncology

## 2020-01-19 ENCOUNTER — Encounter: Payer: Self-pay | Admitting: Oncology

## 2020-01-19 ENCOUNTER — Inpatient Hospital Stay (HOSPITAL_BASED_OUTPATIENT_CLINIC_OR_DEPARTMENT_OTHER): Payer: Medicare Other | Admitting: Oncology

## 2020-01-19 ENCOUNTER — Other Ambulatory Visit: Payer: Self-pay

## 2020-01-19 ENCOUNTER — Inpatient Hospital Stay: Payer: Medicare Other

## 2020-01-19 VITALS — BP 152/68 | HR 60 | Temp 97.6°F | Resp 18 | Wt 214.9 lb

## 2020-01-19 DIAGNOSIS — N281 Cyst of kidney, acquired: Secondary | ICD-10-CM

## 2020-01-19 DIAGNOSIS — Z85828 Personal history of other malignant neoplasm of skin: Secondary | ICD-10-CM | POA: Diagnosis not present

## 2020-01-19 DIAGNOSIS — Z5111 Encounter for antineoplastic chemotherapy: Secondary | ICD-10-CM

## 2020-01-19 DIAGNOSIS — I119 Hypertensive heart disease without heart failure: Secondary | ICD-10-CM | POA: Insufficient documentation

## 2020-01-19 DIAGNOSIS — D696 Thrombocytopenia, unspecified: Secondary | ICD-10-CM

## 2020-01-19 DIAGNOSIS — R634 Abnormal weight loss: Secondary | ICD-10-CM

## 2020-01-19 DIAGNOSIS — Z79899 Other long term (current) drug therapy: Secondary | ICD-10-CM | POA: Diagnosis not present

## 2020-01-19 DIAGNOSIS — C20 Malignant neoplasm of rectum: Secondary | ICD-10-CM | POA: Diagnosis present

## 2020-01-19 LAB — COMPREHENSIVE METABOLIC PANEL
ALT: 17 U/L (ref 0–44)
AST: 31 U/L (ref 15–41)
Albumin: 3.5 g/dL (ref 3.5–5.0)
Alkaline Phosphatase: 222 U/L — ABNORMAL HIGH (ref 38–126)
Anion gap: 9 (ref 5–15)
BUN: 11 mg/dL (ref 8–23)
CO2: 24 mmol/L (ref 22–32)
Calcium: 8.6 mg/dL — ABNORMAL LOW (ref 8.9–10.3)
Chloride: 104 mmol/L (ref 98–111)
Creatinine, Ser: 0.78 mg/dL (ref 0.61–1.24)
GFR calc Af Amer: >60 mL/min (ref >60)
GFR calc non Af Amer: >60 mL/min (ref >60)
Glucose, Bld: 155 mg/dL — ABNORMAL HIGH (ref 70–99)
Potassium: 3.7 mmol/L (ref 3.5–5.1)
Sodium: 137 mmol/L (ref 135–145)
Total Bilirubin: 0.8 mg/dL (ref 0.3–1.2)
Total Protein: 7.2 g/dL (ref 6.5–8.1)

## 2020-01-19 LAB — CBC WITH DIFFERENTIAL/PLATELET
Abs Immature Granulocytes: 0.4 10*3/uL — ABNORMAL HIGH (ref 0.00–0.07)
Basophils Absolute: 0.1 10*3/uL (ref 0.0–0.1)
Basophils Relative: 1 %
Eosinophils Absolute: 0.7 10*3/uL — ABNORMAL HIGH (ref 0.0–0.5)
Eosinophils Relative: 3 %
HCT: 40.9 % (ref 39.0–52.0)
Hemoglobin: 13.9 g/dL (ref 13.0–17.0)
Immature Granulocytes: 2 %
Lymphocytes Relative: 13 %
Lymphs Abs: 2.8 10*3/uL (ref 0.7–4.0)
MCH: 34.1 pg — ABNORMAL HIGH (ref 26.0–34.0)
MCHC: 34 g/dL (ref 30.0–36.0)
MCV: 100.2 fL — ABNORMAL HIGH (ref 80.0–100.0)
Monocytes Absolute: 1.9 10*3/uL — ABNORMAL HIGH (ref 0.1–1.0)
Monocytes Relative: 9 %
Neutro Abs: 15 10*3/uL — ABNORMAL HIGH (ref 1.7–7.7)
Neutrophils Relative %: 72 %
Platelets: 103 10*3/uL — ABNORMAL LOW (ref 150–400)
RBC: 4.08 MIL/uL — ABNORMAL LOW (ref 4.22–5.81)
RDW: 18.5 % — ABNORMAL HIGH (ref 11.5–15.5)
WBC: 20.9 10*3/uL — ABNORMAL HIGH (ref 4.0–10.5)
nRBC: 0 % (ref 0.0–0.2)

## 2020-01-19 MED ORDER — SODIUM CHLORIDE 0.9% FLUSH
10.0000 mL | INTRAVENOUS | Status: DC | PRN
  Administered 2020-01-19: 10 mL
  Filled 2020-01-19: qty 10

## 2020-01-19 MED ORDER — PALONOSETRON HCL INJECTION 0.25 MG/5ML
0.2500 mg | Freq: Once | INTRAVENOUS | Status: AC
  Administered 2020-01-19: 0.25 mg via INTRAVENOUS
  Filled 2020-01-19: qty 5

## 2020-01-19 MED ORDER — SODIUM CHLORIDE 0.9% FLUSH
10.0000 mL | INTRAVENOUS | Status: DC | PRN
  Administered 2020-01-19: 10 mL via INTRAVENOUS
  Filled 2020-01-19: qty 10

## 2020-01-19 MED ORDER — SODIUM CHLORIDE 0.9 % IV SOLN
2400.0000 mg/m2 | INTRAVENOUS | Status: DC
  Administered 2020-01-19: 5350 mg via INTRAVENOUS
  Filled 2020-01-19: qty 107

## 2020-01-19 MED ORDER — OXALIPLATIN CHEMO INJECTION 100 MG/20ML
75.0000 mg/m2 | Freq: Once | INTRAVENOUS | Status: AC
  Administered 2020-01-19: 165 mg via INTRAVENOUS
  Filled 2020-01-19: qty 33

## 2020-01-19 MED ORDER — SODIUM CHLORIDE 0.9 % IV SOLN
10.0000 mg | Freq: Once | INTRAVENOUS | Status: AC
  Administered 2020-01-19: 10 mg via INTRAVENOUS
  Filled 2020-01-19: qty 10

## 2020-01-19 MED ORDER — DEXTROSE 5 % IV SOLN
Freq: Once | INTRAVENOUS | Status: AC
  Filled 2020-01-19: qty 250

## 2020-01-19 MED ORDER — LEUCOVORIN CALCIUM INJECTION 350 MG
404.0000 mg/m2 | Freq: Once | INTRAVENOUS | Status: AC
  Administered 2020-01-19: 900 mg via INTRAVENOUS
  Filled 2020-01-19: qty 45

## 2020-01-19 NOTE — Progress Notes (Signed)
Pt here for follow up. Pt reports increased shortness of breath with little activity and poor appetite.

## 2020-01-19 NOTE — Progress Notes (Signed)
.  Hematology/Oncology follow up  note [facility] [phone] Fax:([phone]   Patient Care Team: Albina Billet, MD as PCP - General (Internal Medicine) Clent Jacks, RN as Oncology Nurse Navigator Earlie Server, MD as Consulting Physician (Oncology)  REFERRING PROVIDER: Albina Billet, MD  CHIEF COMPLAINTS/REASON FOR VISIT:  Follow-up for colorectal cancer  HISTORY OF PRESENTING ILLNESS:   Garrett Peterson is a  74 y.o.  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 Alice Reichert for follow-up on history of colon polyps.  Patient also has reported recent history of change of bowel habits.  He has felt more constipated. No blood in the stool, abdominal pain, unintentional weight loss or fever or chills. Colonoscopy records is at [facility] clinic and is not available to me. 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  MMR is deferred to resection specimen.  # 09/20/2019, patient underwent robotic assisted laparoscopic low anterior resection. Pathology report was reviewed by me and discussed with patient and his wife. I also called pathology and discussed with Dr. Reuel Derby. The tumor is located 1.5 cm distal to the rectosigmoid junction.  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  I explained to patient the different final diagnosis of cancer of rectum rather than endoscopic impression of distal sigmoid colon cancer.  Tumor was not visualized on preop CT scan.  -pT3 pN1 M0, grade 2 adenocarcinoma. MMR status is intact  All margins were uninvolved by invasive carcinoma, high-grade dysplasia/intramucosal adenocarcinoma and low-grade dysplastic.  1 out of 8 regional lymph nodes were positive for malignancy. Patient has lymphovascular  invasion as well as perineural invasion. I discussed with patient that ideally his rectal cancer would be better treated with total neoadjuvant treatment prior to the surgery if we would know his final diagnosis will be an upper rectal cancer.  Preoperative treatments is associated with a more favorable long-term toxicity profile and fever local recurrence then postoperative therapy.  However overall survival appears to be similar.In this case, I would recommend chemotherapy and adjuvant chemo- radiation  # 10/13/2019 patient had CT abdomen pelvis with contrast for evaluation of concern of peritonitis and bowel perforation.  Pneumoperitoneum was seen on earlier radiograph.  There was moderate pneumoperitoneum, extensive colonic diverticulosis.  5.2 x 4.1 cm loculated fluid collection in the left lower abdomen concerning for an infected collection or abscess. Patient had a drain placed and finished a course of antibiotics.  Draining catheter was pulled.  #11/02/2019, patient started adjuvant FOLFOX treatments.  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  Discussed with the patient and encourage him to further discuss with primary care provider for the need of see pulmonology  INTERVAL HISTORY Garrett Peterson is a 74 y.o. male who has above history reviewed by me today presents for follow up visit for management of rectal cancer. Problems and complaints are listed below: Patient is on adjuvant FOLFOX.   Patient tolerates well.  He denies any neuropathy symptoms.  He does feel some shortness of breath with exertion.  Appetite has decreased.  He has lost weight since last visit. He received his booster shot for COVID 19 vaccination yesterday.   Review of Systems  Constitutional: Positive for appetite change, fatigue and unexpected weight change. Negative for chills and  fever.  HENT:   Negative for hearing loss and voice change.   Eyes: Negative for eye  problems and icterus.  Respiratory: Negative for chest tightness, cough and shortness of breath.        SOB with exertion.   Cardiovascular: Negative for chest pain and leg swelling.  Gastrointestinal: Negative for abdominal distention and abdominal pain.  Endocrine: Negative for hot flashes.  Genitourinary: Negative for difficulty urinating, dysuria and frequency.   Musculoskeletal: Negative for arthralgias.  Skin: Negative for itching and rash.  Neurological: Negative for light-headedness and numbness.  Hematological: Negative for adenopathy. Does not bruise/bleed easily.  Psychiatric/Behavioral: Negative for confusion.    MEDICAL HISTORY:  Past Medical History:  Diagnosis Date  . Actinic keratosis   . Arthritis   . Basal cell carcinoma   . CAD (coronary artery disease)   . Cancer ([place]) 2021   rectal  . Hyperlipidemia   . Pre-diabetes     SURGICAL HISTORY: Past Surgical History:  Procedure Laterality Date  . APPENDECTOMY    . CARDIAC CATHETERIZATION    . COLONOSCOPY    . CORONARY ARTERY BYPASS GRAFT    . IR SINUS/FIST TUBE CHK-NON GI  10/27/2019  . open heart surgery    . PORTACATH PLACEMENT Right 10/13/2019   Procedure: INSERTION PORT-A-CATH;  Surgeon: Herbert Pun, MD;  Location: [facility] ORS;  Service: General;  Laterality: Right;    SOCIAL HISTORY: Social History   Socioeconomic History  . Marital status: Married    Spouse name: Not on file  . Number of children: Not on file  . Years of education: Not on file  . Highest education level: Not on file  Occupational History  . Not on file  Tobacco Use  . Smoking status: Never Smoker  . Smokeless tobacco: Never Used  Vaping Use  . Vaping Use: Never used  Substance and Sexual Activity  . Alcohol use: Not Currently    Alcohol/week: 0.0 standard drinks  . Drug use: Never  . Sexual activity: Not Currently  Other Topics Concern  . Not on file  Social History Narrative  . Not on file   Social  Determinants of Health   Financial Resource Strain:   . Difficulty of Paying Living Expenses: Not on file  Food Insecurity:   . Worried About Charity fundraiser in the Last Year: Not on file  . Ran Out of Food in the Last Year: Not on file  Transportation Needs:   . Lack of Transportation (Medical): Not on file  . Lack of Transportation (Non-Medical): Not on file  Physical Activity:   . Days of Exercise per Week: Not on file  . Minutes of Exercise per Session: Not on file  Stress:   . Feeling of Stress : Not on file  Social Connections:   . Frequency of Communication with Friends and Family: Not on file  . Frequency of Social Gatherings with Friends and Family: Not on file  . Attends Religious Services: Not on file  . Active Member of Clubs or Organizations: Not on file  . Attends Archivist Meetings: Not on file  . Marital Status: Not on file  Intimate Partner Violence:   . Fear of Current or Ex-Partner: Not on file  . Emotionally Abused: Not on file  . Physically Abused: Not on file  . Sexually Abused: Not on file    FAMILY HISTORY: Family History  Problem Relation Age of Onset  . Lung disease Father  ALLERGIES:  has No Known Allergies.  MEDICATIONS:  Current Outpatient Medications  Medication Sig Dispense Refill  . atorvastatin (LIPITOR) 20 MG tablet Take 20 mg by mouth every evening.     . lidocaine-prilocaine (EMLA) cream Apply to affected area once 30 g 3  . metoprolol tartrate (LOPRESSOR) 25 MG tablet Take 25 mg by mouth 2 (two) times daily.    . polyethylene glycol (MIRALAX) 17 g packet Take 17 g by mouth daily as needed for severe constipation. 20 each 1  . prochlorperazine (COMPAZINE) 10 MG tablet Take 1 tablet (10 mg total) by mouth every 6 (six) hours as needed (Nausea or vomiting). 30 tablet 1  . senna (SENOKOT) 8.6 MG TABS tablet Take 2 tablets (17.2 mg total) by mouth daily. 120 tablet 1  . temazepam (RESTORIL) 30 MG capsule Take 30 mg by  mouth at bedtime.      No current facility-administered medications for this visit.     PHYSICAL EXAMINATION: ECOG PERFORMANCE STATUS: 1 - Symptomatic but completely ambulatory Vitals:   01/19/20 0843  BP: (!) 152/68  Pulse: 60  Resp: 18  Temp: 97.6 F (36.4 C)  SpO2: 94%   Filed Weights   01/19/20 0843  Weight: 214 lb 14.4 oz (97.5 kg)    Physical Exam Constitutional:      General: He is not in acute distress. HENT:     Head: Normocephalic and atraumatic.  Eyes:     General: No scleral icterus. Cardiovascular:     Rate and Rhythm: Normal rate and regular rhythm.     Heart sounds: Normal heart sounds.  Pulmonary:     Effort: Pulmonary effort is normal. No respiratory distress.     Breath sounds: No wheezing.  Abdominal:     General: Bowel sounds are normal. There is no distension.     Palpations: Abdomen is soft.  Musculoskeletal:        General: No deformity. Normal range of motion.     Cervical back: Normal range of motion and neck supple.  Skin:    General: Skin is warm and dry.     Findings: No erythema or rash.     Comments: Right anterior chest wall + Mediport  Neurological:     Mental Status: He is alert and oriented to person, place, and time. Mental status is at baseline.     Cranial Nerves: No cranial nerve deficit.     Coordination: Coordination normal.  Psychiatric:        Mood and Affect: Mood normal.     LABORATORY DATA:  I have reviewed the data as listed Lab Results  Component Value Date   WBC 7.5 01/05/2020   HGB 13.1 01/05/2020   HCT 38.7 (L) 01/05/2020   MCV 98.5 01/05/2020   PLT 98 (L) 01/05/2020   Recent Labs    12/15/19 0812 12/29/19 0810 01/05/20 0809  NA 139 141 141  K 3.4* 3.0* 3.6  CL 105 107 108  CO2 _0 GLUCOSE 136* 158* 122*  BUN _1 CREATININE 0.89 0.87 0.88  CALCIUM 8.8* 8.7* 8.7*  GFRNONAA >60 >60 >60  GFRAA >60 >60 >60  PROT 7.2 7.0 7.1  ALBUMIN 3.7 3.6 3.6  AST 31 46* 33  ALT 32 33 23    ALKPHOS 116 184* 155*  BILITOT 0.7 0.6 0.6   Iron/TIBC/Ferritin/ %Sat    Component Value Date/Time   IRON 100 09/06/2019 1200   TIBC 368 09/06/2019 1200  FERRITIN 198 09/06/2019 1200   IRONPCTSAT 27 09/06/2019 1200      RADIOGRAPHIC STUDIES: I have personally reviewed the radiological images as listed and agreed with the findings in the report. CT ABDOMEN PELVIS W CONTRAST  Result Date: 10/27/2019 CLINICAL DATA:  History of low anterior resection to remove a rectosigmoid carcinoma with contained anastomotic leak ultimately requiring CT-guided placement of a percutaneous drainage catheter placement on 10/18/2019 Patient presents today for abdominal CT and potential drainage catheter injection. Patient reports little to no output from the percutaneous catheter for the past several days. EXAM: CT ABDOMEN AND PELVIS WITH CONTRAST TECHNIQUE: Multidetector CT imaging of the abdomen and pelvis was performed using the standard protocol following bolus administration of intravenous contrast. CONTRAST:  184m OMNIPAQUE IOHEXOL 300 MG/ML  SOLN COMPARISON:  CT abdomen and pelvis-10/13/2019; CT-guided left lower abdominal/pelvic percutaneous drainage catheter placement-10/18/2019 FINDINGS: Lower chest: Limited visualization of the lower thorax redemonstrates minimal bibasilar reticular and ground-glass opacities with subpleural sparing. No discrete focal airspace opacities. No pleural effusion. Cardiomegaly. Post median sternotomy. Calcifications within native coronary arteries. No pericardial effusion. Hepatobiliary: Mild nodularity hepatic contour. There is a minimal amount of focal fatty infiltration adjacent to the fissure for the ligamentum teres. No discrete hepatic lesions. There is a punctate granuloma within the dome of the right lobe of the liver. Post cholecystectomy. No intra extrahepatic bili duct dilatation. No ascites. Pancreas: Normal appearance of the pancreas. Spleen: Normal appearance of  the spleen. Adrenals/Urinary Tract: There is symmetric enhancement and excretion of the bilateral kidneys. There is a punctate (2 mm) nonobstructing stone within the inferior pole the left kidney (image 50, series 6). No evidence of right-sided nephrolithiasis on this postcontrast examination. Note is made of an approximately 8.4 cm hypoattenuating nonenhancing exophytic cyst arising from the anterior superior pole of the left kidney (image 36, series 3). There is an approximately 0.8 cm hyperattenuating nonenhancing partially exophytic lesion arising from the inferior pole the left kidney (axial image 51, series 3; image 31, series 8; coronal image 49, series 6), which is too small to accurately characterize though may represent a hyperdense renal cyst. No discrete right-sided renal lesions. There is a minimal amount of likely age and body habitus related bilateral symmetric perinephric stranding. No evidence of urinary obstruction. Normal appearance the bilateral adrenal glands. Normal appearance of the urinary bladder given degree of distention. Stomach/Bowel: Stable positioning of left lower abdominal/pelvic percutaneous drainage catheter with resolution of previously noted pericolonic abscess. There is a minimal amount of ill-defined stranding about the descending colon with minimal amount of free fluid in the pelvic cul-de-sac but without definable/drainable fluid collection within the abdomen or pelvis. Interval resolution of postoperative pneumoperitoneum. Stable primary colonic anastomosis within the distal sigmoid/rectum. Large colonic stool burden without evidence of enteric obstruction. Normal appearance of the terminal ileum and appendix. No discrete areas of bowel wall thickening. No pneumoperitoneum, pneumatosis or portal venous gas. Vascular/Lymphatic: Moderate to large amount of mixed calcified and noncalcified atherosclerotic plaque within normal caliber abdominal aorta. The major branch vessels of  the abdominal aorta appear patent on this non CTA examination. No bulky retroperitoneal, mesenteric, pelvic or inguinal lymphadenopathy. Reproductive: Dystrophic calcifications within normal sized prostate gland. There is a small amount of likely reactive free fluid in the pelvic cul-de-sac. Other: Interval resolution of previously noted left lateral abdominal wall subcutaneous emphysema. Small bilateral mesenteric fat containing inguinal hernias. Linear areas of subcutaneous stranding involving the ventral aspect of the abdominal wall likely represent laparotomy ports (axial  images 24, 27, 32 and 46). No associated hernia. There is a minimal amount of subcutaneous edema about the midline of the low back. Musculoskeletal: No acute or aggressive osseous abnormalities. 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 Moderate bilateral facet degenerative change of the lower lumbar spine. Moderate degenerative change the bilateral hips with joint space loss, subchondral sclerosis and osteophytosis. IMPRESSION: 1. Complete resolution of left lower abdominal/pelvic abscess following percutaneous drainage catheter placement. 2. There is a minimal amount of mesenteric stranding about the descending colon and ill-defined free fluid in the pelvic cul-de-sac without new definable/drainable fluid collection within the abdomen or pelvis. 3. Interval resolution of postoperative pneumoperitoneum and body wall subcutaneous emphysema. 4. Stable sequela of distal colonic primary anastomosis without evidence of enteric obstruction. 5. Extensive colonic diverticulosis without evidence superimposed acute diverticulitis. 6. Large colonic stool burden. 7. Punctate (2 mm) nonobstructing left-sided renal stone. 8. Mild nodularity of the hepatic contour as could be seen in the setting of early cirrhotic change. Correlation with LFTs  is advised. 9.  Aortic Atherosclerosis (ICD10-I70.0). PLAN: Patient subsequently underwent percutaneous drainage catheter injection. Electronically Signed   By: Sandi Mariscal M.D.   On: 10/27/2019 11:06   IR Sinus/Fist Tube Chk-Non GI  Result Date: 10/27/2019 CLINICAL DATA:  History of low anterior resection for rectosigmoid carcinoma with contained anastomotic leak, ultimately requiring CT-guided placement of percutaneous catheter on 10/18/2019 Kathlene Cote). CT scan performed earlier today demonstrates complete resolution of the pericolonic abscess. As such patient now presents for drainage catheter injection. Patient reports little to no output from the percutaneous drainage catheter for the past several days. EXAM: SINUS TRACT INJECTION/FISTULOGRAM COMPARISON:  CT abdomen and pelvis-earlier same day; 10/13/2019; CT-guided percutaneous catheter placement-10/18/2019 CONTRAST:  15 cc Omnipaque 300 - administered via the existing percutaneous drain. FLUOROSCOPY TIME:  42 seconds (14 mGy) TECHNIQUE: The patient was positioned supine on the fluoroscopy table. A preprocedural spot fluoroscopic image was obtained of the left lower abdomen/pelvis and the existing percutaneous drainage catheter. Multiple spot fluoroscopic and radiographic images were obtained following the injection of a small amount of contrast via the existing percutaneous drainage catheter. Images reviewed and discussed with the patient. The decision was made to remove the percutaneous drainage catheter. As such, the external portion of the drainage catheter was cut and drainage catheters removed intact. A dressing was applied. The patient tolerated the procedure well without immediate postprocedural complication. FINDINGS: Preprocedural spot fluoroscopic image demonstrates a percutaneous drainage catheter overlying the left lower abdomen/pelvis. Excreted contrast from recent abdominal CT is seen within the urinary bladder. Contrast injection  demonstrates opacification of decompressed abscess cavity with reflux of contrast along the catheter tract. There is no definitive communication between the decompressed abscess cavity and the adjacent intestines as confirmed following aspiration of the entirety of the injected enteric contrast. IMPRESSION: Drain injection is negative for the presence of an enteric fistula. Given lack of output from the drainage catheter for the past several days, resolution of the pericolonic abscess on preceding abdominal CT and injection being negative for the presence of an enteric fistula, the drainage catheter was removed at the patient's bedside without incident. PLAN: Patient instructed to maintain all follow-up appointments with his referring surgeon Windell Moment) as well as oncologist (Dr. Tasia Catchings), and may otherwise follow-up at the interventional radiology drain clinic a p.r.n. basis. Electronically Signed   By: Sandi Mariscal M.D.   On: 10/27/2019 12:11  ASSESSMENT & PLAN:  1. Weight loss   2. Rectal cancer ([place])   3. Encounter for antineoplastic chemotherapy   4. Thrombocytopenia ([place])   5. 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 Labs are reviewed and discussed with patient. Patient will proceed with FOLFOX today-oxaliplatin 75 mg/m, no 5-FU bolus. Plan repeat imaging after 8 cycles of FOLFOX.  #Chemotherapy-induced thrombocytopenia, platelet count is stable today.  Proceed with treatment. #Renal cyst, plan MRI abdomen with and without contrast for further evaluation of the renal cyst. #Weight loss, decreased appetite.  Refer to nutritionist recommend patient to start nutritional supplements. Discussed with patient about CDC recommeAll questions were answered. The patient knows to call the clinic with any problems questions or concerns.  Return of visit: 2 weeks  Earlie Server, MD, PhD Hematology Oncology [facility] at [facility] Pager-  [phone] 01/19/2020

## 2020-01-21 ENCOUNTER — Inpatient Hospital Stay: Payer: Medicare Other

## 2020-01-21 ENCOUNTER — Other Ambulatory Visit: Payer: Self-pay

## 2020-01-21 VITALS — BP 177/78 | HR 80 | Temp 98.0°F | Resp 18

## 2020-01-21 DIAGNOSIS — Z5111 Encounter for antineoplastic chemotherapy: Secondary | ICD-10-CM | POA: Diagnosis not present

## 2020-01-21 DIAGNOSIS — C20 Malignant neoplasm of rectum: Secondary | ICD-10-CM

## 2020-01-21 MED ORDER — HEPARIN SOD (PORK) LOCK FLUSH 100 UNIT/ML IV SOLN
INTRAVENOUS | Status: AC
  Filled 2020-01-21: qty 5

## 2020-01-21 MED ORDER — SODIUM CHLORIDE 0.9% FLUSH
10.0000 mL | INTRAVENOUS | Status: DC | PRN
  Administered 2020-01-21: 10 mL
  Filled 2020-01-21: qty 10

## 2020-01-21 MED ORDER — PEGFILGRASTIM 6 MG/0.6ML ~~LOC~~ PSKT
6.0000 mg | PREFILLED_SYRINGE | Freq: Once | SUBCUTANEOUS | Status: AC
  Administered 2020-01-21: 6 mg via SUBCUTANEOUS
  Filled 2020-01-21: qty 0.6

## 2020-01-21 MED ORDER — HEPARIN SOD (PORK) LOCK FLUSH 100 UNIT/ML IV SOLN
500.0000 [IU] | Freq: Once | INTRAVENOUS | Status: AC | PRN
  Administered 2020-01-21: 500 [IU]
  Filled 2020-01-21: qty 5

## 2020-01-21 NOTE — Progress Notes (Signed)
On 01/19/2020 WBC 20.9 and ANC 15 Dr. Tasia Catchings aware. Per Dr. Tasia Catchings proceed with [facility] as ordered.

## 2020-02-01 ENCOUNTER — Encounter: Payer: Self-pay | Admitting: Oncology

## 2020-02-02 ENCOUNTER — Inpatient Hospital Stay: Payer: Medicare Other

## 2020-02-02 ENCOUNTER — Encounter: Payer: Self-pay | Admitting: Oncology

## 2020-02-02 ENCOUNTER — Inpatient Hospital Stay (HOSPITAL_BASED_OUTPATIENT_CLINIC_OR_DEPARTMENT_OTHER): Payer: Medicare Other | Admitting: Oncology

## 2020-02-02 ENCOUNTER — Other Ambulatory Visit: Payer: Self-pay

## 2020-02-02 VITALS — BP 165/71 | HR 56 | Temp 98.7°F | Resp 18 | Wt 211.0 lb

## 2020-02-02 DIAGNOSIS — D696 Thrombocytopenia, unspecified: Secondary | ICD-10-CM | POA: Diagnosis not present

## 2020-02-02 DIAGNOSIS — R634 Abnormal weight loss: Secondary | ICD-10-CM

## 2020-02-02 DIAGNOSIS — C20 Malignant neoplasm of rectum: Secondary | ICD-10-CM

## 2020-02-02 DIAGNOSIS — Z5111 Encounter for antineoplastic chemotherapy: Secondary | ICD-10-CM

## 2020-02-02 DIAGNOSIS — N281 Cyst of kidney, acquired: Secondary | ICD-10-CM

## 2020-02-02 LAB — CBC WITH DIFFERENTIAL/PLATELET
Abs Immature Granulocytes: 1.88 10*3/uL — ABNORMAL HIGH (ref 0.00–0.07)
Basophils Absolute: 0.4 10*3/uL — ABNORMAL HIGH (ref 0.0–0.1)
Basophils Relative: 1 %
Eosinophils Absolute: 0.7 10*3/uL — ABNORMAL HIGH (ref 0.0–0.5)
Eosinophils Relative: 3 %
HCT: 41.6 % (ref 39.0–52.0)
Hemoglobin: 14.4 g/dL (ref 13.0–17.0)
Immature Granulocytes: 7 %
Lymphocytes Relative: 11 %
Lymphs Abs: 2.9 10*3/uL (ref 0.7–4.0)
MCH: 34.7 pg — ABNORMAL HIGH (ref 26.0–34.0)
MCHC: 34.6 g/dL (ref 30.0–36.0)
MCV: 100.2 fL — ABNORMAL HIGH (ref 80.0–100.0)
Monocytes Absolute: 2.4 10*3/uL — ABNORMAL HIGH (ref 0.1–1.0)
Monocytes Relative: 9 %
Neutro Abs: 17.9 10*3/uL — ABNORMAL HIGH (ref 1.7–7.7)
Neutrophils Relative %: 69 %
Platelets: 147 10*3/uL — ABNORMAL LOW (ref 150–400)
RBC: 4.15 MIL/uL — ABNORMAL LOW (ref 4.22–5.81)
RDW: 15.9 % — ABNORMAL HIGH (ref 11.5–15.5)
WBC: 26.2 10*3/uL — ABNORMAL HIGH (ref 4.0–10.5)
nRBC: 0.1 % (ref 0.0–0.2)

## 2020-02-02 LAB — COMPREHENSIVE METABOLIC PANEL
ALT: 24 U/L (ref 0–44)
AST: 35 U/L (ref 15–41)
Albumin: 3.2 g/dL — ABNORMAL LOW (ref 3.5–5.0)
Alkaline Phosphatase: 261 U/L — ABNORMAL HIGH (ref 38–126)
Anion gap: 9 (ref 5–15)
BUN: 10 mg/dL (ref 8–23)
CO2: 26 mmol/L (ref 22–32)
Calcium: 8.8 mg/dL — ABNORMAL LOW (ref 8.9–10.3)
Chloride: 104 mmol/L (ref 98–111)
Creatinine, Ser: 0.94 mg/dL (ref 0.61–1.24)
GFR calc Af Amer: >60 mL/min (ref >60)
GFR calc non Af Amer: >60 mL/min (ref >60)
Glucose, Bld: 160 mg/dL — ABNORMAL HIGH (ref 70–99)
Potassium: 3.5 mmol/L (ref 3.5–5.1)
Sodium: 139 mmol/L (ref 135–145)
Total Bilirubin: 0.6 mg/dL (ref 0.3–1.2)
Total Protein: 7 g/dL (ref 6.5–8.1)

## 2020-02-02 MED ORDER — OXALIPLATIN CHEMO INJECTION 100 MG/20ML
75.0000 mg/m2 | Freq: Once | INTRAVENOUS | Status: AC
  Administered 2020-02-02: 165 mg via INTRAVENOUS
  Filled 2020-02-02: qty 33

## 2020-02-02 MED ORDER — PALONOSETRON HCL INJECTION 0.25 MG/5ML
0.2500 mg | Freq: Once | INTRAVENOUS | Status: AC
  Administered 2020-02-02: 0.25 mg via INTRAVENOUS
  Filled 2020-02-02: qty 5

## 2020-02-02 MED ORDER — LEUCOVORIN CALCIUM INJECTION 350 MG
404.0000 mg/m2 | Freq: Once | INTRAVENOUS | Status: AC
  Administered 2020-02-02: 900 mg via INTRAVENOUS
  Filled 2020-02-02: qty 45

## 2020-02-02 MED ORDER — SODIUM CHLORIDE 0.9 % IV SOLN
2400.0000 mg/m2 | INTRAVENOUS | Status: DC
  Administered 2020-02-02: 5350 mg via INTRAVENOUS
  Filled 2020-02-02: qty 107

## 2020-02-02 MED ORDER — SERTRALINE HCL 25 MG PO TABS: 25.0000 mg | ORAL_TABLET | Freq: Every day | ORAL | 0 refills | Status: DC

## 2020-02-02 MED ORDER — DEXTROSE 5 % IV SOLN
Freq: Once | INTRAVENOUS | Status: AC
  Filled 2020-02-02: qty 250

## 2020-02-02 MED ORDER — SODIUM CHLORIDE 0.9 % IV SOLN
10.0000 mg | Freq: Once | INTRAVENOUS | Status: AC
  Administered 2020-02-02: 10 mg via INTRAVENOUS
  Filled 2020-02-02: qty 10

## 2020-02-02 NOTE — Telephone Encounter (Signed)
Omn[employer] Insight results scanned in media.

## 2020-02-02 NOTE — Progress Notes (Signed)
Patient reports no appetite but he does eat 3-4 small meals a day and drinks 1-2 Boost meal replacements a day.

## 2020-02-02 NOTE — Progress Notes (Signed)
.  Hematology/Oncology follow up  note [facility] Telephone:([phone] Fax:(336) [phone]   Patient Care Team: Albina Billet, MD as PCP - General (Internal Medicine) Clent Jacks, RN as Oncology Nurse Navigator Earlie Server, MD as Consulting Physician (Oncology)  REFERRING PROVIDER: Albina Billet, MD  CHIEF COMPLAINTS/REASON FOR VISIT:  Follow-up for colorectal cancer  HISTORY OF PRESENTING ILLNESS:   Garrett Peterson is a  74 y.o.  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 Alice Reichert for follow-up on history of colon polyps.  Patient also has reported recent history of change of bowel habits.  He has felt more constipated. No blood in the stool, abdominal pain, unintentional weight loss or fever or chills. Colonoscopy records is at [facility] clinic and is not available to me. 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  MMR is deferred to resection specimen.  # 09/20/2019, patient underwent robotic assisted laparoscopic low anterior resection. Pathology report was reviewed by me and discussed with patient and his wife. I also called pathology and discussed with Dr. Reuel Derby. The tumor is located 1.5 cm distal to the rectosigmoid junction.  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  I explained to patient the different final diagnosis of cancer of rectum rather than endoscopic impression of distal sigmoid colon cancer.  Tumor was not visualized on preop CT scan.  -pT3 pN1 M0, grade 2 adenocarcinoma. MMR status is intact  All margins were uninvolved by invasive carcinoma, high-grade dysplasia/intramucosal adenocarcinoma and low-grade dysplastic.  1 out of 8 regional lymph nodes were positive for malignancy. Patient has lymphovascular  invasion as well as perineural invasion. I discussed with patient that ideally his rectal cancer would be better treated with total neoadjuvant treatment prior to the surgery if we would know his final diagnosis will be an upper rectal cancer.  Preoperative treatments is associated with a more favorable long-term toxicity profile and fever local recurrence then postoperative therapy.  However overall survival appears to be similar.In this case, I would recommend chemotherapy and adjuvant chemo- radiation  # 10/13/2019 patient had CT abdomen pelvis with contrast for evaluation of concern of peritonitis and bowel perforation.  Pneumoperitoneum was seen on earlier radiograph.  There was moderate pneumoperitoneum, extensive colonic diverticulosis.  5.2 x 4.1 cm loculated fluid collection in the left lower abdomen concerning for an infected collection or abscess. Patient had a drain placed and finished a course of antibiotics.  Draining catheter was pulled.  #11/02/2019, patient started adjuvant FOLFOX treatments.  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  Discussed with the patient and encourage him to further discuss with primary care provider for the need of see pulmonology  INTERVAL HISTORY Garrett Peterson is a 74 y.o. male who has above history reviewed by me today presents for follow up visit for management of rectal cancer. Problems and complaints are listed below: Patient is on adjuvant FOLFOX.   Patient received booster COVID-19 vaccination prior to last cycle of chemotherapy. He reports feeling tired and fatigued. Otherwise no new symptoms No fever, chills, nausea vomiting.  He has lost 3 pounds since last visit.  He does not have good appetite. Sleep is not good.  He has chronic insomnia for which he takes temazepam at the bedtime.  Patient  has problem remaining asleep.  Wakes up early.  He admits that his mood is depressed since chemotherapy  started.  Review of Systems  Constitutional: Positive for appetite change, fatigue and unexpected weight change. Negative for chills and fever.  HENT:   Negative for hearing loss and voice change.   Eyes: Negative for eye problems and icterus.  Respiratory: Negative for chest tightness, cough and shortness of breath.   Cardiovascular: Negative for chest pain and leg swelling.  Gastrointestinal: Negative for abdominal distention and abdominal pain.  Endocrine: Negative for hot flashes.  Genitourinary: Negative for difficulty urinating, dysuria and frequency.   Musculoskeletal: Negative for arthralgias.  Skin: Negative for itching and rash.  Neurological: Negative for light-headedness and numbness.  Hematological: Negative for adenopathy. Does not bruise/bleed easily.  Psychiatric/Behavioral: Positive for depression and sleep disturbance. Negative for confusion.    MEDICAL HISTORY:  Past Medical History:  Diagnosis Date  . Actinic keratosis   . Arthritis   . Basal cell carcinoma   . CAD (coronary artery disease)   . Cancer ([place]) 2021   rectal  . Hyperlipidemia   . Pre-diabetes     SURGICAL HISTORY: Past Surgical History:  Procedure Laterality Date  . APPENDECTOMY    . CARDIAC CATHETERIZATION    . COLONOSCOPY    . CORONARY ARTERY BYPASS GRAFT    . IR SINUS/FIST TUBE CHK-NON GI  10/27/2019  . open heart surgery    . PORTACATH PLACEMENT Right 10/13/2019   Procedure: INSERTION PORT-A-CATH;  Surgeon: Herbert Pun, MD;  Location: [facility] ORS;  Service: General;  Laterality: Right;    SOCIAL HISTORY: Social History   Socioeconomic History  . Marital status: Married    Spouse name: Not on file  . Number of children: Not on file  . Years of education: Not on file  . Highest education level: Not on file  Occupational History  . Not on file  Tobacco Use  . Smoking status: Never Smoker  . Smokeless tobacco: Never Used  Vaping Use  . Vaping Use: Never used  Substance  and Sexual Activity  . Alcohol use: Not Currently    Alcohol/week: 0.0 standard drinks  . Drug use: Never  . Sexual activity: Not Currently  Other Topics Concern  . Not on file  Social History Narrative  . Not on file   Social Determinants of Health   Financial Resource Strain:   . Difficulty of Paying Living Expenses: Not on file  Food Insecurity:   . Worried About Charity fundraiser in the Last Year: Not on file  . Ran Out of Food in the Last Year: Not on file  Transportation Needs:   . Lack of Transportation (Medical): Not on file  . Lack of Transportation (Non-Medical): Not on file  Physical Activity:   . Days of Exercise per Week: Not on file  . Minutes of Exercise per Session: Not on file  Stress:   . Feeling of Stress : Not on file  Social Connections:   . Frequency of Communication with Friends and Family: Not on file  . Frequency of Social Gatherings with Friends and Family: Not on file  . Attends Religious Services: Not on file  . Active Member of Clubs or Organizations: Not on file  . Attends Archivist Meetings: Not on file  . Marital Status: Not on file  Intimate Partner Violence:   . Fear of Current or Ex-Partner: Not on file  . Emotionally Abused: Not on file  .  Physically Abused: Not on file  . Sexually Abused: Not on file    FAMILY HISTORY: Family History  Problem Relation Age of Onset  . Lung disease Father     ALLERGIES:  has No Known Allergies.  MEDICATIONS:  Current Outpatient Medications  Medication Sig Dispense Refill  . atorvastatin (LIPITOR) 20 MG tablet Take 20 mg by mouth every evening.     . lidocaine-prilocaine (EMLA) cream Apply to affected area once 30 g 3  . metoprolol tartrate (LOPRESSOR) 25 MG tablet Take 25 mg by mouth 2 (two) times daily.    . polyethylene glycol (MIRALAX) 17 g packet Take 17 g by mouth daily as needed for severe constipation. 20 each 1  . prochlorperazine (COMPAZINE) 10 MG tablet Take 1 tablet (10  mg total) by mouth every 6 (six) hours as needed (Nausea or vomiting). 30 tablet 1  . senna (SENOKOT) 8.6 MG TABS tablet Take 2 tablets (17.2 mg total) by mouth daily. 120 tablet 1  . temazepam (RESTORIL) 30 MG capsule Take 30 mg by mouth at bedtime.      No current facility-administered medications for this visit.     PHYSICAL EXAMINATION: ECOG PERFORMANCE STATUS: 1 - Symptomatic but completely ambulatory Vitals:   02/02/20 0841  BP: (!) 165/71  Pulse: (!) 56  Resp: 18  Temp: 98.7 F (37.1 C)   Filed Weights   02/02/20 0841  Weight: 211 lb (95.7 kg)    Physical Exam Constitutional:      General: He is not in acute distress. HENT:     Head: Normocephalic and atraumatic.  Eyes:     General: No scleral icterus. Cardiovascular:     Rate and Rhythm: Normal rate and regular rhythm.     Heart sounds: Normal heart sounds.  Pulmonary:     Effort: Pulmonary effort is normal. No respiratory distress.     Breath sounds: No wheezing.  Abdominal:     General: Bowel sounds are normal. There is no distension.     Palpations: Abdomen is soft.  Musculoskeletal:        General: No deformity. Normal range of motion.     Cervical back: Normal range of motion and neck supple.  Skin:    General: Skin is warm and dry.     Findings: No erythema or rash.     Comments: Right anterior chest wall + Mediport  Neurological:     Mental Status: He is alert and oriented to person, place, and time. Mental status is at baseline.     Cranial Nerves: No cranial nerve deficit.     Coordination: Coordination normal.  Psychiatric:        Mood and Affect: Mood normal.     LABORATORY DATA:  I have reviewed the data as listed Lab Results  Component Value Date   WBC 20.9 (H) 01/19/2020   HGB 13.9 01/19/2020   HCT 40.9 01/19/2020   MCV 100.2 (H) 01/19/2020   PLT 103 (L) 01/19/2020   Recent Labs    12/29/19 0810 01/05/20 0809 01/19/20 0815  NA 141 141 137  K 3.0* 3.6 3.7  CL 107 108 104    CO2 _0 GLUCOSE 158* 122* 155*  BUN _1 CREATININE 0.87 0.88 0.78  CALCIUM 8.7* 8.7* 8.6*  GFRNONAA >60 >60 >60  GFRAA >60 >60 >60  PROT 7.0 7.1 7.2  ALBUMIN 3.6 3.6 3.5  AST 46* 33 31  ALT 33 23 17  ALKPHOS  184* 155* 222*  BILITOT 0.6 0.6 0.8   Iron/TIBC/Ferritin/ %Sat    Component Value Date/Time   IRON 100 09/06/2019 1200   TIBC 368 09/06/2019 1200   FERRITIN 198 09/06/2019 1200   IRONPCTSAT 27 09/06/2019 1200      RADIOGRAPHIC STUDIES: I have personally reviewed the radiological images as listed and agreed with the findings in the report. No results found.    ASSESSMENT & PLAN:  1. Rectal cancer ([place])   2. Encounter for antineoplastic chemotherapy   3. Weight loss   4. Thrombocytopenia ([place])   5. 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 Labs are reviewed and discussed with patient .  Patient will proceed with cycle 7 FOLFOX today- -oxaliplatin 75 mg/m, no 5-FU bolus. Plan total of 8 cycles of FOLFOX Will refer patient to establish care with radiation oncology for concurrent chemoradiation Leukocytosis likely due to Neulasta and recent vaccination.  Will hold off Neulasta kit for the cycle. #Chemotherapy-induced thrombocytopenia, platelet count has increased today.   #Renal cyst, plan MRI abdomen with and without contrast for further evaluation of the renal cyst.  In the future  #Weight loss, decreased appetite.  Refer to nutritionist recommend patient to start nutritional supplements. #Insomnia, depressed mood, I added Zoloft 25 mg daily. Patient agrees with the plan. Discussed with patient about CDC recommeAll questions were answered. The patient knows to call the clinic with any problems questions or concerns.  Return of visit: 2 weeks  Earlie Server, MD, PhD Hematology Oncology [facility] Pager- [phone] 02/02/2020

## 2020-02-03 LAB — CEA: CEA: 8.6 ng/mL — ABNORMAL HIGH (ref 0.0–4.7)

## 2020-02-04 ENCOUNTER — Other Ambulatory Visit: Payer: Self-pay

## 2020-02-04 ENCOUNTER — Inpatient Hospital Stay: Payer: Medicare Other

## 2020-02-04 DIAGNOSIS — Z5111 Encounter for antineoplastic chemotherapy: Secondary | ICD-10-CM | POA: Diagnosis not present

## 2020-02-04 DIAGNOSIS — C20 Malignant neoplasm of rectum: Secondary | ICD-10-CM

## 2020-02-04 MED ORDER — HEPARIN SOD (PORK) LOCK FLUSH 100 UNIT/ML IV SOLN
500.0000 [IU] | Freq: Once | INTRAVENOUS | Status: AC | PRN
  Administered 2020-02-04: 500 [IU]
  Filled 2020-02-04: qty 5

## 2020-02-04 MED ORDER — SODIUM CHLORIDE 0.9% FLUSH
10.0000 mL | INTRAVENOUS | Status: DC | PRN
  Administered 2020-02-04: 10 mL
  Filled 2020-02-04: qty 10

## 2020-02-07 ENCOUNTER — Ambulatory Visit
Admission: RE | Admit: 2020-02-07 | Discharge: 2020-02-07 | Disposition: A | Discharge status: Home / Self Care | Payer: Medicare Other | Source: Ambulatory Visit | Attending: Radiation Oncology | Admitting: Radiation Oncology

## 2020-02-07 ENCOUNTER — Other Ambulatory Visit: Payer: Self-pay

## 2020-02-07 ENCOUNTER — Encounter: Payer: Self-pay | Admitting: Radiation Oncology

## 2020-02-07 ENCOUNTER — Inpatient Hospital Stay: Payer: Medicare Other

## 2020-02-07 VITALS — BP 131/69 | HR 58 | Temp 98.6°F | Resp 20 | Wt 204.0 lb

## 2020-02-07 DIAGNOSIS — C20 Malignant neoplasm of rectum: Secondary | ICD-10-CM | POA: Diagnosis present

## 2020-02-07 DIAGNOSIS — K5900 Constipation, unspecified: Secondary | ICD-10-CM | POA: Diagnosis not present

## 2020-02-07 DIAGNOSIS — Z85828 Personal history of other malignant neoplasm of skin: Secondary | ICD-10-CM | POA: Insufficient documentation

## 2020-02-07 DIAGNOSIS — E785 Hyperlipidemia, unspecified: Secondary | ICD-10-CM | POA: Insufficient documentation

## 2020-02-07 DIAGNOSIS — Z79899 Other long term (current) drug therapy: Secondary | ICD-10-CM | POA: Insufficient documentation

## 2020-02-07 DIAGNOSIS — M129 Arthropathy, unspecified: Secondary | ICD-10-CM | POA: Diagnosis not present

## 2020-02-07 DIAGNOSIS — Z9221 Personal history of antineoplastic chemotherapy: Secondary | ICD-10-CM | POA: Insufficient documentation

## 2020-02-07 DIAGNOSIS — I2510 Atherosclerotic heart disease of native coronary artery without angina pectoris: Secondary | ICD-10-CM | POA: Diagnosis not present

## 2020-02-07 NOTE — Consult Note (Signed)
NEW PATIENT EVALUATION  Name: Garrett Peterson  MRN: [id number]  Date:   02/07/2020     DOB: Sep 13, 1945   This 74 y.o. 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  REFERRING PHYSICIAN: Albina Billet, MD  CHIEF COMPLAINT:  Chief Complaint  Patient presents with  . Rectal Cancer    DIAGNOSIS: The encounter diagnosis was Rectal cancer ([place]).   PREVIOUS INVESTIGATIONS:  Pathology report reviewed Clinical notes reviewed CT scans reviewed  HPI: Patient is a 74 year old male who complained of increasing constipation.  He underwent a colonoscopy with discovery of a sigmoid rectal mass with biopsy positive for adenocarcinoma moderately differentiated.  MSI was stable.  Patient underwent a robotic assisted low anterior resection with the tumor being located 1.5 cm distal to the rectosigmoid junction.  Tumor was 5.6 cm with macroscopic tumor penetration and perforation of the wall.  Tumor was overall grade 2.  Invaded through the muscularis propria into the pericolonic t tissue.  Lymphovascular invasion was present.  1 of 8 lymph nodes was positive for metastatic disease.  Patient did develop pneumoperitoneum after completion of surgery although that had resolved.  She was started on adjuvant FOLFOX chemotherapy which she is tolerated fairly well although is he is starting to have some fatigue and lack of appetite.  He also states he is over the past week had a sore throat is seeing his family doctor for that tomorrow.  He is now referred to radiation collagen for consideration of adjuvant radiation therapy after completion of FOLFOX.  PLANNED TREATMENT REGIMEN: Concurrent chemoradiation  PAST MEDICAL HISTORY:  has a past medical history of Actinic keratosis, Arthritis, Basal cell carcinoma, CAD (coronary artery disease), Cancer ([place]) (2021),  Hyperlipidemia, and Pre-diabetes.    PAST SURGICAL HISTORY:  Past Surgical History:  Procedure Laterality Date  . APPENDECTOMY    . CARDIAC CATHETERIZATION    . COLONOSCOPY    . CORONARY ARTERY BYPASS GRAFT    . IR SINUS/FIST TUBE CHK-NON GI  10/27/2019  . open heart surgery    . PORTACATH PLACEMENT Right 10/13/2019   Procedure: INSERTION PORT-A-CATH;  Surgeon: Herbert Pun, MD;  Location: [facility] ORS;  Service: General;  Laterality: Right;    FAMILY HISTORY: family history includes Lung disease in his father.  SOCIAL HISTORY:  reports that he has never smoked. He has never used smokeless tobacco. He reports previous alcohol use. He reports that he does not use drugs.  ALLERGIES: Patient has no known allergies.  MEDICATIONS:  Current Outpatient Medications  Medication Sig Dispense Refill  . atorvastatin (LIPITOR) 20 MG tablet Take 20 mg by mouth every evening.     . lidocaine-prilocaine (EMLA) cream Apply to affected area once 30 g 3  . metoprolol tartrate (LOPRESSOR) 25 MG tablet Take 25 mg by mouth 2 (two) times daily.    . polyethylene glycol (MIRALAX) 17 g packet Take 17 g by mouth daily as needed for severe constipation. 20 each 1  . prochlorperazine (COMPAZINE) 10 MG tablet Take 1 tablet (10 mg total) by mouth every 6 (six) hours as needed (Nausea or vomiting). 30 tablet 1  . senna (SENOKOT) 8.6 MG TABS tablet Take 2 tablets (17.2 mg total) by mouth daily. 120 tablet 1  . sertraline (ZOLOFT) 25 MG tablet Take 1 tablet (25 mg total) by mouth daily. 30 tablet 0  . temazepam (RESTORIL)  30 MG capsule Take 30 mg by mouth at bedtime.      No current facility-administered medications for this encounter.    ECOG PERFORMANCE STATUS:  0 - Asymptomatic  REVIEW OF SYSTEMS: Patient denies any weight loss, fatigue, weakness, fever, chills or night sweats. Patient denies any loss of vision, blurred vision. Patient denies any ringing  of the ears or hearing loss. No irregular  heartbeat. Patient denies heart murmur or history of fainting. Patient denies any chest pain or pain radiating to her upper extremities. Patient denies any shortness of breath, difficulty breathing at night, cough or hemoptysis. Patient denies any swelling in the lower legs. Patient denies any nausea vomiting, vomiting of blood, or coffee ground material in the vomitus. Patient denies any stomach pain. Patient states has had normal bowel movements no significant constipation or diarrhea. Patient denies any dysuria, hematuria or significant nocturia. Patient denies any problems walking, swelling in the joints or loss of balance. Patient denies any skin changes, loss of hair or loss of weight. Patient denies any excessive worrying or anxiety or significant depression. Patient denies any problems with insomnia. Patient denies excessive thirst, polyuria, polydipsia. Patient denies any swollen glands, patient denies easy bruising or easy bleeding. Patient denies any recent infections, allergies or URI. Patient "s visual fields have not changed significantly in recent time.   PHYSICAL EXAM: BP 131/69 (BP Location: Right Arm, Patient Position: Sitting, Cuff Size: Large)   Pulse (!) 58   Temp 98.6 F (37 C) (Tympanic)   Resp 20   Wt 204 lb (92.5 kg)   SpO2 95%   BMI 30.13 kg/m  Well-developed well-nourished patient in NAD. HEENT reveals PERLA, EOMI, discs not visualized.  Oral cavity is clear. No oral mucosal lesions are identified. Neck is clear without evidence of cervical or supraclavicular adenopathy. Lungs are clear to A&P. Cardiac examination is essentially unremarkable with regular rate and rhythm without murmur rub or thrill. Abdomen is benign with no organomegaly or masses noted. Motor sensory and DTR levels are equal and symmetric in the upper and lower extremities. Cranial nerves II through XII are grossly intact. Proprioception is intact. No peripheral adenopathy or edema is identified. No motor or  sensory levels are noted. Crude visual fields are within normal range.  LABORATORY DATA: Pathology report reviewed molecular studies reviewed    RADIOLOGY RESULTS: CT scans reviewed difficult to ascertain initial tumor volume by CT criteria  IMPRESSION: Stage III (T3 N1 M0) moderately differentiated adenocarcinoma of the rectum status post upfront robotic surgery followed by FOLFOX chemotherapy in 74 year old male  PLAN: This time elected ahead with whole pelvic radiation therapy.  Would treat with probable Xeloda as a radiation sensitizer.  I would treat up to 4500 cGy to his whole pelvis boosting the area of original tumor involving another 540 cGy.  Risks and benefits of treatment including increased lower urinary tract symptoms diarrhea fatigue alteration of blood counts skin reaction all were described in detail to the patient.  There will be extra effort by both professional staff as well as technical staff to coordinate and manage concurrent chemoradiation and ensuing side effects during his treatments. We will coordinate his chemotherapy with medical oncology.  I have put on the schedule for about 2 weeks from now to let him complete his FOLFOX chemotherapy and recuperate somewhat prior to starting radiation.  Patient comprehends my recommendation well.  I would like to take this opportunity to thank you for allowing me to participate in the care  of your patient.Noreene Filbert, MD

## 2020-02-07 NOTE — Progress Notes (Signed)
Nutrition Assessment:  Referral for weight loss and poor appetite.  74 year old male with rectal cancer stage III.  Past medical history of lap low anterior resection 09/20/19, peritonitis and bowel perforation on 10/13/19, CAD, HLD, pre-diabetes, basal cell carcinoma.  Patient receiving folfox chemotherapy.  Planning to start radiation.    Met with patient following radiation consult.  Patient reports that he does not have an appetite.  Reports that he takes a few bites then feels full.  Denies nausea.  Reports bowels move at least 1 time per day, sometimes 2-3, no diarrhea.  Reports normal frequency of bowel movement before all this was 1-2 times per day.  Reports this am ate egg sandwich and by the time he had eaten 3/4 of it was getting full.  Drinks boost shakes (30gm) 1-2 times per day but does not like them at room temperature.  Has cold sensitivity with chemotherapy treatments.  Wife prepares meals.      Medications: zoloft, compazine  Labs: reviewed  Anthropometrics:   Height: 69 inches Weight: 204 lb today 8/25 221 lb BMI: 30  8% weight loss in the last month, signfiicant   Estimated Energy Needs  Kcals: 2300-2700 Protein: 115-135 g Fluid: > 2.3 L  NUTRITION DIAGNOSIS: Inadequate oral intake related to cancer related treatment side effects as evidenced by 8% weight loss in the last month   INTERVENTION:  Discussed ways to increase calories and protein.  Handout given to patient. Encouraged small frequent meals Recipes provided to patient, including warm drink recipes Samples of oral nutrition supplements given higher in calories (orgain, ensure complete and boost plus). Encouraged drinking 1 30 g protein shake and 1 higher calorie shake.  Contact information provided    MONITORING, EVALUATION, GOAL: weight trends, intake   NEXT VISIT: October 18 phone call  Garrett Peterson, [place], [place] Registered Dietitian [phone] (mobile)

## 2020-02-09 ENCOUNTER — Inpatient Hospital Stay: Payer: Medicare Other

## 2020-02-09 ENCOUNTER — Other Ambulatory Visit: Payer: Self-pay

## 2020-02-09 ENCOUNTER — Telehealth: Payer: Self-pay

## 2020-02-09 DIAGNOSIS — C20 Malignant neoplasm of rectum: Secondary | ICD-10-CM

## 2020-02-09 DIAGNOSIS — Z5111 Encounter for antineoplastic chemotherapy: Secondary | ICD-10-CM | POA: Diagnosis not present

## 2020-02-09 LAB — CBC WITH DIFFERENTIAL/PLATELET
Abs Immature Granulocytes: 0.04 10*3/uL (ref 0.00–0.07)
Basophils Absolute: 0.1 10*3/uL (ref 0.0–0.1)
Basophils Relative: 1 %
Eosinophils Absolute: 0.2 10*3/uL (ref 0.0–0.5)
Eosinophils Relative: 2 %
HCT: 39.5 % (ref 39.0–52.0)
Hemoglobin: 14.1 g/dL (ref 13.0–17.0)
Immature Granulocytes: 1 %
Lymphocytes Relative: 23 %
Lymphs Abs: 1.9 10*3/uL (ref 0.7–4.0)
MCH: 35.2 pg — ABNORMAL HIGH (ref 26.0–34.0)
MCHC: 35.7 g/dL (ref 30.0–36.0)
MCV: 98.5 fL (ref 80.0–100.0)
Monocytes Absolute: 0.7 10*3/uL (ref 0.1–1.0)
Monocytes Relative: 8 %
Neutro Abs: 5.5 10*3/uL (ref 1.7–7.7)
Neutrophils Relative %: 65 %
Platelets: 132 10*3/uL — ABNORMAL LOW (ref 150–400)
RBC: 4.01 MIL/uL — ABNORMAL LOW (ref 4.22–5.81)
RDW: 14.7 % (ref 11.5–15.5)
WBC: 8.4 10*3/uL (ref 4.0–10.5)
nRBC: 0 % (ref 0.0–0.2)

## 2020-02-09 NOTE — Telephone Encounter (Signed)
9/28: Per Dawson from Dr. Tasia Catchings: I was notified by cardiology that they did a chest x-ray on patient and he has bilateral pneumonia.  He has been started on a course of antibiotics.  Please follow-up with him later this week to see if shortness of breath/coughing has increased.  Depending on how he does we may need to postpone his next chemo.  9/29: Patient had labs today and per Dr. Tasia Catchings:  blood work looks fine. please check when does he finishes antibiotics. and please reschedule his next treatment appt to be 2-3 days after he finishes antibiotics. thanks.  Talked to patient and he states that his breathing and coughing are "a little better" than yesterday. His cardiologist told him that he should be feeling better by next week. Pt will complete antibiotic course on Firday 10/8. I notified him that his appts will need to be rescheduled to 2-3 days after he completes antibiotics and he voiced understanding.

## 2020-02-10 NOTE — Telephone Encounter (Signed)
Done... I have tried calling pt x3 and have been unable to reach by phone to make  him aware of his NEW sched date and time of his appts. I'll try calling him again later, But an updated appt reminder letter will be mailed out making him aware also.

## 2020-02-16 ENCOUNTER — Ambulatory Visit: Payer: Medicare Other

## 2020-02-16 ENCOUNTER — Ambulatory Visit: Payer: Medicare Other | Admitting: Oncology

## 2020-02-16 ENCOUNTER — Other Ambulatory Visit: Payer: Medicare Other

## 2020-02-21 ENCOUNTER — Other Ambulatory Visit: Payer: Self-pay | Admitting: Internal Medicine

## 2020-02-21 DIAGNOSIS — J189 Pneumonia, unspecified organism: Secondary | ICD-10-CM

## 2020-02-22 ENCOUNTER — Ambulatory Visit
Admission: RE | Admit: 2020-02-22 | Discharge: 2020-02-22 | Disposition: A | Discharge status: Home / Self Care | Payer: Medicare Other | Attending: Internal Medicine | Admitting: Internal Medicine

## 2020-02-22 ENCOUNTER — Other Ambulatory Visit: Payer: Self-pay

## 2020-02-22 ENCOUNTER — Ambulatory Visit
Admission: RE | Admit: 2020-02-22 | Discharge: 2020-02-22 | Disposition: A | Discharge status: Home / Self Care | Payer: Medicare Other | Source: Ambulatory Visit | Attending: Internal Medicine | Admitting: Internal Medicine

## 2020-02-22 DIAGNOSIS — J189 Pneumonia, unspecified organism: Secondary | ICD-10-CM | POA: Diagnosis present

## 2020-02-23 ENCOUNTER — Inpatient Hospital Stay: Payer: Medicare Other | Attending: Oncology

## 2020-02-23 ENCOUNTER — Encounter: Payer: Self-pay | Admitting: Oncology

## 2020-02-23 ENCOUNTER — Inpatient Hospital Stay: Payer: Medicare Other

## 2020-02-23 ENCOUNTER — Inpatient Hospital Stay (HOSPITAL_BASED_OUTPATIENT_CLINIC_OR_DEPARTMENT_OTHER): Payer: Medicare Other | Admitting: Oncology

## 2020-02-23 ENCOUNTER — Ambulatory Visit: Payer: Medicare Other

## 2020-02-23 VITALS — BP 159/78 | HR 53 | Temp 98.5°F | Resp 18 | Wt 210.0 lb

## 2020-02-23 DIAGNOSIS — R634 Abnormal weight loss: Secondary | ICD-10-CM | POA: Diagnosis not present

## 2020-02-23 DIAGNOSIS — R0602 Shortness of breath: Secondary | ICD-10-CM | POA: Diagnosis not present

## 2020-02-23 DIAGNOSIS — D696 Thrombocytopenia, unspecified: Secondary | ICD-10-CM | POA: Insufficient documentation

## 2020-02-23 DIAGNOSIS — Z5111 Encounter for antineoplastic chemotherapy: Secondary | ICD-10-CM

## 2020-02-23 DIAGNOSIS — N281 Cyst of kidney, acquired: Secondary | ICD-10-CM | POA: Insufficient documentation

## 2020-02-23 DIAGNOSIS — C20 Malignant neoplasm of rectum: Secondary | ICD-10-CM

## 2020-02-23 DIAGNOSIS — Z79899 Other long term (current) drug therapy: Secondary | ICD-10-CM | POA: Insufficient documentation

## 2020-02-23 DIAGNOSIS — I119 Hypertensive heart disease without heart failure: Secondary | ICD-10-CM | POA: Diagnosis not present

## 2020-02-23 DIAGNOSIS — J189 Pneumonia, unspecified organism: Secondary | ICD-10-CM | POA: Diagnosis not present

## 2020-02-23 DIAGNOSIS — F32A Depression, unspecified: Secondary | ICD-10-CM | POA: Insufficient documentation

## 2020-02-23 DIAGNOSIS — R058 Other specified cough: Secondary | ICD-10-CM | POA: Diagnosis not present

## 2020-02-23 DIAGNOSIS — Z85828 Personal history of other malignant neoplasm of skin: Secondary | ICD-10-CM | POA: Diagnosis not present

## 2020-02-23 DIAGNOSIS — R531 Weakness: Secondary | ICD-10-CM | POA: Diagnosis not present

## 2020-02-23 DIAGNOSIS — G4700 Insomnia, unspecified: Secondary | ICD-10-CM | POA: Diagnosis not present

## 2020-02-23 DIAGNOSIS — R918 Other nonspecific abnormal finding of lung field: Secondary | ICD-10-CM | POA: Insufficient documentation

## 2020-02-23 DIAGNOSIS — Z95828 Presence of other vascular implants and grafts: Secondary | ICD-10-CM

## 2020-02-23 LAB — CBC WITH DIFFERENTIAL/PLATELET
Abs Immature Granulocytes: 0.02 10*3/uL (ref 0.00–0.07)
Basophils Absolute: 0.1 10*3/uL (ref 0.0–0.1)
Basophils Relative: 1 %
Eosinophils Absolute: 0.4 10*3/uL (ref 0.0–0.5)
Eosinophils Relative: 5 %
HCT: 41.5 % (ref 39.0–52.0)
Hemoglobin: 14.6 g/dL (ref 13.0–17.0)
Immature Granulocytes: 0 %
Lymphocytes Relative: 25 %
Lymphs Abs: 2 10*3/uL (ref 0.7–4.0)
MCH: 34.8 pg — ABNORMAL HIGH (ref 26.0–34.0)
MCHC: 35.2 g/dL (ref 30.0–36.0)
MCV: 99 fL (ref 80.0–100.0)
Monocytes Absolute: 1.5 10*3/uL — ABNORMAL HIGH (ref 0.1–1.0)
Monocytes Relative: 19 %
Neutro Abs: 4 10*3/uL (ref 1.7–7.7)
Neutrophils Relative %: 50 %
Platelets: 176 10*3/uL (ref 150–400)
RBC: 4.19 MIL/uL — ABNORMAL LOW (ref 4.22–5.81)
RDW: 13.9 % (ref 11.5–15.5)
WBC: 8.1 10*3/uL (ref 4.0–10.5)
nRBC: 0 % (ref 0.0–0.2)

## 2020-02-23 LAB — COMPREHENSIVE METABOLIC PANEL
ALT: 30 U/L (ref 0–44)
AST: 38 U/L (ref 15–41)
Albumin: 3.3 g/dL — ABNORMAL LOW (ref 3.5–5.0)
Alkaline Phosphatase: 193 U/L — ABNORMAL HIGH (ref 38–126)
Anion gap: 8 (ref 5–15)
BUN: 9 mg/dL (ref 8–23)
CO2: 25 mmol/L (ref 22–32)
Calcium: 9 mg/dL (ref 8.9–10.3)
Chloride: 105 mmol/L (ref 98–111)
Creatinine, Ser: 0.84 mg/dL (ref 0.61–1.24)
GFR, Estimated: >60 mL/min (ref >60)
Glucose, Bld: 116 mg/dL — ABNORMAL HIGH (ref 70–99)
Potassium: 4.1 mmol/L (ref 3.5–5.1)
Sodium: 138 mmol/L (ref 135–145)
Total Bilirubin: 0.6 mg/dL (ref 0.3–1.2)
Total Protein: 7.3 g/dL (ref 6.5–8.1)

## 2020-02-23 MED ORDER — SODIUM CHLORIDE 0.9% FLUSH
10.0000 mL | Freq: Once | INTRAVENOUS | Status: AC
  Administered 2020-02-23: 10 mL via INTRAVENOUS
  Filled 2020-02-23: qty 10

## 2020-02-23 MED ORDER — HEPARIN SOD (PORK) LOCK FLUSH 100 UNIT/ML IV SOLN
500.0000 [IU] | Freq: Once | INTRAVENOUS | Status: AC
  Administered 2020-02-23: 500 [IU] via INTRAVENOUS
  Filled 2020-02-23: qty 5

## 2020-02-23 NOTE — Progress Notes (Signed)
Patient recently treated for pneumonia by PCP.  Still has some SOBr when he first wakes up the the mornings.  Went for a chest x-ray yesterday ordered by PCP.

## 2020-02-23 NOTE — Progress Notes (Signed)
.  Hematology/Oncology follow up  note [facility] [phone] [phone]   Patient Care Team: Albina Billet, MD as PCP - General (Internal Medicine) Clent Jacks, RN as Oncology Nurse Navigator Earlie Server, MD as Consulting Physician (Oncology)  REFERRING PROVIDER: Albina Billet, MD  CHIEF COMPLAINTS/REASON FOR VISIT:  Follow-up for colorectal cancer  HISTORY OF PRESENTING ILLNESS:   Garrett Peterson is a  74 y.o.  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 Alice Reichert for follow-up on history of colon polyps.  Patient also has reported recent history of change of bowel habits.  He has felt more constipated. No blood in the stool, abdominal pain, unintentional weight loss or fever or chills. Colonoscopy records is at [facility] clinic and is not available to me. 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  MMR is deferred to resection specimen.  # 09/20/2019, patient underwent robotic assisted laparoscopic low anterior resection. Pathology report was reviewed by me and discussed with patient and his wife. I also called pathology and discussed with Dr. Reuel Derby. The tumor is located 1.5 cm distal to the rectosigmoid junction.  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  I explained to patient the different final diagnosis of cancer of rectum rather than endoscopic impression of distal sigmoid colon cancer.  Tumor was not visualized on preop CT scan.  -pT3 pN1 M0, grade 2 adenocarcinoma. MMR status is intact  All margins were uninvolved by invasive carcinoma, high-grade dysplasia/intramucosal adenocarcinoma and low-grade dysplastic.  1 out of 8 regional lymph nodes were positive for malignancy. Patient has lymphovascular  invasion as well as perineural invasion. I discussed with patient that ideally his rectal cancer would be better treated with total neoadjuvant treatment prior to the surgery if we would know his final diagnosis will be an upper rectal cancer.  Preoperative treatments is associated with a more favorable long-term toxicity profile and fever local recurrence then postoperative therapy.  However overall survival appears to be similar.In this case, I would recommend chemotherapy and adjuvant chemo- radiation  # 10/13/2019 patient had CT abdomen pelvis with contrast for evaluation of concern of peritonitis and bowel perforation.  Pneumoperitoneum was seen on earlier radiograph.  There was moderate pneumoperitoneum, extensive colonic diverticulosis.  5.2 x 4.1 cm loculated fluid collection in the left lower abdomen concerning for an infected collection or abscess. Patient had a drain placed and finished a course of antibiotics.  Draining catheter was pulled.  #11/02/2019, patient started adjuvant FOLFOX treatments.  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  Discussed with the patient and encourage him to further discuss with primary care provider for the need of see pulmonology  INTERVAL HISTORY Garrett Peterson is a 74 y.o. male who has above history reviewed by me today presents for follow up visit for management of rectal cancer. Problems and complaints are listed below: Patient is on adjuvant FOLFOX.   Her last chemotherapy, patient has developed productive cough, weakness and was seen by primary care provider.  Chest x-ray showed lower lung infiltrates and patient was treated with a course of antibiotics.  He has finished antibiotics.  Cough has improved but not yet totally resolved.  He feels shortness of breath in the morning.  No fever, chills,  Review  of Systems  Constitutional: Positive for fatigue and unexpected weight change. Negative for appetite  change, chills and fever.  HENT:   Negative for hearing loss and voice change.   Eyes: Negative for eye problems and icterus.  Respiratory: Negative for chest tightness, cough and shortness of breath.   Cardiovascular: Negative for chest pain and leg swelling.  Gastrointestinal: Negative for abdominal distention and abdominal pain.  Endocrine: Negative for hot flashes.  Genitourinary: Negative for difficulty urinating, dysuria and frequency.   Musculoskeletal: Negative for arthralgias.  Skin: Negative for itching and rash.  Neurological: Negative for light-headedness and numbness.  Hematological: Negative for adenopathy. Does not bruise/bleed easily.  Psychiatric/Behavioral: Negative for confusion.    MEDICAL HISTORY:  Past Medical History:  Diagnosis Date  . Actinic keratosis   . Arthritis   . Basal cell carcinoma   . CAD (coronary artery disease)   . Cancer ([place]) 2021   rectal  . Hyperlipidemia   . Pre-diabetes     SURGICAL HISTORY: Past Surgical History:  Procedure Laterality Date  . APPENDECTOMY    . CARDIAC CATHETERIZATION    . COLONOSCOPY    . CORONARY ARTERY BYPASS GRAFT    . IR SINUS/FIST TUBE CHK-NON GI  10/27/2019  . open heart surgery    . PORTACATH PLACEMENT Right 10/13/2019   Procedure: INSERTION PORT-A-CATH;  Surgeon: Herbert Pun, MD;  Location: [facility] ORS;  Service: General;  Laterality: Right;    SOCIAL HISTORY: Social History   Socioeconomic History  . Marital status: Married    Spouse name: Not on file  . Number of children: Not on file  . Years of education: Not on file  . Highest education level: Not on file  Occupational History  . Not on file  Tobacco Use  . Smoking status: Never Smoker  . Smokeless tobacco: Never Used  Vaping Use  . Vaping Use: Never used  Substance and Sexual Activity  . Alcohol use: Not Currently    Alcohol/week: 0.0 standard drinks  . Drug use: Never  . Sexual activity: Not Currently  Other Topics Concern    . Not on file  Social History Narrative  . Not on file   Social Determinants of Health   Financial Resource Strain:   . Difficulty of Paying Living Expenses: Not on file  Food Insecurity:   . Worried About Charity fundraiser in the Last Year: Not on file  . Ran Out of Food in the Last Year: Not on file  Transportation Needs:   . Lack of Transportation (Medical): Not on file  . Lack of Transportation (Non-Medical): Not on file  Physical Activity:   . Days of Exercise per Week: Not on file  . Minutes of Exercise per Session: Not on file  Stress:   . Feeling of Stress : Not on file  Social Connections:   . Frequency of Communication with Friends and Family: Not on file  . Frequency of Social Gatherings with Friends and Family: Not on file  . Attends Religious Services: Not on file  . Active Member of Clubs or Organizations: Not on file  . Attends Archivist Meetings: Not on file  . Marital Status: Not on file  Intimate Partner Violence:   . Fear of Current or Ex-Partner: Not on file  . Emotionally Abused: Not on file  . Physically Abused: Not on file  . Sexually Abused: Not on file    FAMILY HISTORY: Family History  Problem Relation Age of  Onset  . Lung disease Father     ALLERGIES:  has No Known Allergies.  MEDICATIONS:  Current Outpatient Medications  Medication Sig Dispense Refill  . atorvastatin (LIPITOR) 20 MG tablet Take 20 mg by mouth every evening.     . lidocaine-prilocaine (EMLA) cream Apply to affected area once 30 g 3  . metoprolol tartrate (LOPRESSOR) 25 MG tablet Take 25 mg by mouth 2 (two) times daily.    . polyethylene glycol (MIRALAX) 17 g packet Take 17 g by mouth daily as needed for severe constipation. 20 each 1  . prochlorperazine (COMPAZINE) 10 MG tablet Take 1 tablet (10 mg total) by mouth every 6 (six) hours as needed (Nausea or vomiting). 30 tablet 1  . senna (SENOKOT) 8.6 MG TABS tablet Take 2 tablets (17.2 mg total) by mouth daily.  120 tablet 1  . sertraline (ZOLOFT) 25 MG tablet Take 1 tablet (25 mg total) by mouth daily. 30 tablet 0  . temazepam (RESTORIL) 30 MG capsule Take 30 mg by mouth at bedtime.      No current facility-administered medications for this visit.     PHYSICAL EXAMINATION: ECOG PERFORMANCE STATUS: 1 - Symptomatic but completely ambulatory Vitals:   02/23/20 0845  BP: (!) 159/78  Pulse: (!) 53  Resp: 18  Temp: 98.5 F (36.9 C)   Filed Weights   02/23/20 0845  Weight: 210 lb (95.3 kg)    Physical Exam Constitutional:      General: He is not in acute distress. HENT:     Head: Normocephalic and atraumatic.  Eyes:     General: No scleral icterus. Cardiovascular:     Rate and Rhythm: Normal rate and regular rhythm.     Heart sounds: Normal heart sounds.  Pulmonary:     Effort: Pulmonary effort is normal. No respiratory distress.     Breath sounds: No wheezing.  Abdominal:     General: Bowel sounds are normal. There is no distension.     Palpations: Abdomen is soft.  Musculoskeletal:        General: No deformity. Normal range of motion.     Cervical back: Normal range of motion and neck supple.  Skin:    General: Skin is warm and dry.     Findings: No erythema or rash.     Comments: Right anterior chest wall + Mediport  Neurological:     Mental Status: He is alert and oriented to person, place, and time. Mental status is at baseline.     Cranial Nerves: No cranial nerve deficit.     Coordination: Coordination normal.  Psychiatric:        Mood and Affect: Mood normal.     LABORATORY DATA:  I have reviewed the data as listed Lab Results  Component Value Date   WBC 8.1 02/23/2020   HGB 14.6 02/23/2020   HCT 41.5 02/23/2020   MCV 99.0 02/23/2020   PLT 176 02/23/2020   Recent Labs    01/05/20 0809 01/05/20 0809 01/19/20 0815 02/02/20 0817 02/23/20 0813  NA 141   < > 137 139 138  K 3.6   < > 3.7 3.5 4.1  CL 108   < > 104 104 105  CO2 24   < > _0 GLUCOSE  122*   < > 155* 160* 116*  BUN 9   < > _1 CREATININE 0.88   < > 0.78 0.94 0.84  CALCIUM 8.7*   < >  8.6* 8.8* 9.0  GFRNONAA >60   < > >60 >60 >60  GFRAA >60  --  >60 >60  --   PROT 7.1   < > 7.2 7.0 7.3  ALBUMIN 3.6   < > 3.5 3.2* 3.3*  AST 33   < > 31 35 38  ALT 23   < > _0 ALKPHOS 155*   < > 222* 261* 193*  BILITOT 0.6   < > 0.8 0.6 0.6   < > = values in this interval not displayed.   Iron/TIBC/Ferritin/ %Sat    Component Value Date/Time   IRON 100 09/06/2019 1200   TIBC 368 09/06/2019 1200   FERRITIN 198 09/06/2019 1200   IRONPCTSAT 27 09/06/2019 1200      RADIOGRAPHIC STUDIES: I have personally reviewed the radiological images as listed and agreed with the findings in the report. No results found.    ASSESSMENT & PLAN:  1. Rectal cancer ([place])   2. Encounter for antineoplastic chemotherapy   3. Thrombocytopenia ([place])   4. Community acquired pneumonia, unspecified laterality    #Rectal cancer stage III Patient is on adjuvant FOLFOX. Labs are reviewed and discussed with patient. Hold off chemotherapy given recent pneumonia, and not complete resolution of his symptoms He had a chest x-ray yesterday which the result is pending.  #Renal cyst, plan MRI abdomen with and without contrast for further evaluation of the renal cyst.  In the future  #Weight loss, decreased appetite.  Refer to nutritionist recommend patient to start nutritional supplements. #Insomnia, depressed mood, continue Zoloft 25 mg daily.  # Addendum, patient 's cxr showed multiple bilateral airspace oapacities. Recommend pt to obtain CT scan.  Patient was notified recommendation and reports that he was recently tested positive for COVID 19. Refer pt to monoclonal antibody infusion.   Return of visit: 2 weeks.   Earlie Server, MD, PhD Hematology Oncology [facility] Pager- [phone] 02/23/2020

## 2020-02-24 LAB — CEA: CEA: 10.2 ng/mL — ABNORMAL HIGH (ref 0.0–4.7)

## 2020-02-25 ENCOUNTER — Inpatient Hospital Stay: Payer: Medicare Other

## 2020-02-25 ENCOUNTER — Telehealth (HOSPITAL_COMMUNITY): Payer: Self-pay

## 2020-02-25 ENCOUNTER — Ambulatory Visit: Payer: Medicare Other

## 2020-02-25 NOTE — Telephone Encounter (Signed)
Santiago Glad, from the patient's PCP office called and stated the patient is still waiting for a callback from our [facility]. Per Santiago Glad, referral was placed on Wednesday 10/13. After reviewing documentation symptom start date was on 9/28. Per Santiago Glad, patient came to the office and c/o cough. Pt was tested for COVID on 9/28, which came back, negative. Pt called their PCP office with worsening symptoms on 10/5 and was sent for a CXR which was done on 10/12. Results included Covid PNA. Pt was referred to our infusion clinic the next day on 10/13. RN stated she would speak to the NP at the clinic and return call regarding eligibility.

## 2020-02-28 ENCOUNTER — Telehealth: Payer: Self-pay

## 2020-02-28 ENCOUNTER — Telehealth: Payer: Self-pay | Admitting: Nurse Practitioner

## 2020-02-28 ENCOUNTER — Inpatient Hospital Stay: Payer: Medicare Other

## 2020-02-28 DIAGNOSIS — C20 Malignant neoplasm of rectum: Secondary | ICD-10-CM

## 2020-02-28 NOTE — Telephone Encounter (Signed)
Will hold the CT for now and Dr. Tasia Catchings would like for him to be scheduled for 2 weeks lab/MD/Folfox/D3 dc pump with onpro.    Will update radiation about patient schedule.

## 2020-02-28 NOTE — Telephone Encounter (Addendum)
8:48 am--Order entered.  Left a general message on home phone that Dr. Tasia Catchings wanted to schedule an appt for him.  Will call again later.

## 2020-02-28 NOTE — Telephone Encounter (Signed)
Called patient to inform him of new appts.

## 2020-02-28 NOTE — Progress Notes (Signed)
Nutrition Follow-up:   Patient with rectal cancer stage II.  Chemotherapy on hold due to pneumonia, now COVID 19 +.  Spoke with patient via phone.  Patient reports that his appetite has improved since being off chemo and taste has improved.  Eating better.  Drinking 2 shakes per day.  Ate pork chop biscuit for breakfast. Lunch is grab and go and supper last night was homemade vegetable soup with chicken.     Medications: reviewed  Labs: reviewed  Anthropometrics:   Weight increased to 210 lb from 204 lb on 9/27  Patient wants to stay around 215 lb   NUTRITION DIAGNOSIS: Inadequate oral intake improving   INTERVENTION:  Encouraged patient to continue good sources of calories and protein. Increased needs due to covid19 and cancer.   Encouraged oral nutrition supplements at least BID     MONITORING, EVALUATION, GOAL: weight trends, intake   NEXT VISIT: Nov 22 phone f/u  Sonny Anthes B. Zenia Resides, [place], [place] Registered Dietitian [phone] (mobile)

## 2020-02-28 NOTE — Telephone Encounter (Signed)
Garrett Peterson, Dr. Tasia Catchings would like for you to reach out to patient in regards to monoclonal antibody therapy for COVID.

## 2020-02-28 NOTE — Telephone Encounter (Signed)
-----  Message from Earlie Server, MD sent at 02/28/2020  8:40 AM EDT ----- His cxr is not better, multiple area of opacity.and CEA is rising Recommend him to do CT chest abd pelvis w contrast Thanks. Please arrange

## 2020-02-28 NOTE — Telephone Encounter (Signed)
Attempted to call patient again but no answer.  I would like to inform him of MD plan for STAT CT before getting it scheduled.

## 2020-02-28 NOTE — Telephone Encounter (Signed)
Spoke to patient and he has tested positive for COVID on 02/23/20.  He went back to a drive thru testing site this morning for re-testing.  He stated he did not want to go for more CT testing and I explained to him the reasoning of additional CT imaging and he said "OK".

## 2020-02-28 NOTE — Telephone Encounter (Signed)
I called Garrett Peterson to discuss Covid symptoms and the use of casirivimab/imdevimab, a monoclonal antibody infusion for those with mild to moderate Covid symptoms and at a high risk of hospitalization.  He called our hotline to see if maybe he was a candidate after being told that a 10/12 CXR showed PNA.  He is currently on abx therapy.  He has been having DOE since PNA dx and so he went for COVID testing today.  He does not know result yet.  That said, as previously documented in his chart, and he confirmed today, he has been having DOE since at least 9/28, which led to a COVID swab at that time, which was negative.  It was due to worsening DOE, that he was referred to cardiology [name]) and then had CXR, showing presumed COVID PNA.  Pt does not qualify for infusion therapy as pt's symptoms first presented > 10 days prior to timing of infusion. Symptoms tier reviewed as well as criteria for ending isolation. Preventative practices reviewed. Patient verbalized understanding.  I did rec that if his COVID test returns + today, that he reach back out to his PCP, as he may benefit from steroid taper/inhalers, etc.    [name], NP

## 2020-02-28 NOTE — Telephone Encounter (Signed)
Done   Pt has been sched for lab/MD/Folfox/D3 pump dc/w onpro as requested A NEW appt reminder letter will be mailed out

## 2020-02-28 NOTE — Telephone Encounter (Signed)
General message to return call left on home and cell phone that appt is needed.

## 2020-03-01 ENCOUNTER — Ambulatory Visit
Admission: RE | Admit: 2020-03-01 | Discharge: 2020-03-01 | Disposition: A | Discharge status: Home / Self Care | Payer: Medicare Other | Attending: Internal Medicine | Admitting: Internal Medicine

## 2020-03-01 ENCOUNTER — Other Ambulatory Visit: Payer: Self-pay | Admitting: Internal Medicine

## 2020-03-01 ENCOUNTER — Other Ambulatory Visit: Payer: Self-pay

## 2020-03-01 ENCOUNTER — Ambulatory Visit
Admission: RE | Admit: 2020-03-01 | Discharge: 2020-03-01 | Disposition: A | Discharge status: Home / Self Care | Payer: Medicare Other | Source: Ambulatory Visit | Attending: Internal Medicine | Admitting: Internal Medicine

## 2020-03-01 DIAGNOSIS — R059 Cough, unspecified: Secondary | ICD-10-CM

## 2020-03-02 ENCOUNTER — Ambulatory Visit
Admission: RE | Admit: 2020-03-02 | Discharge: 2020-03-02 | Disposition: A | Discharge status: Home / Self Care | Payer: Medicare Other | Source: Ambulatory Visit | Attending: Oncology | Admitting: Oncology

## 2020-03-02 ENCOUNTER — Ambulatory Visit: Payer: Medicare Other

## 2020-03-02 ENCOUNTER — Telehealth: Payer: Self-pay | Admitting: *Deleted

## 2020-03-02 ENCOUNTER — Telehealth: Payer: Self-pay | Admitting: Oncology

## 2020-03-02 DIAGNOSIS — C20 Malignant neoplasm of rectum: Secondary | ICD-10-CM | POA: Diagnosis present

## 2020-03-02 DIAGNOSIS — J189 Pneumonia, unspecified organism: Secondary | ICD-10-CM

## 2020-03-02 MED ORDER — IOHEXOL 300 MG/ML  SOLN
100.0000 mL | Freq: Once | INTRAMUSCULAR | Status: AC | PRN
  Administered 2020-03-02: 100 mL via INTRAVENOUS

## 2020-03-02 NOTE — Telephone Encounter (Signed)
CT scan was reviewed. I called patient and he feels very shortness of breath. Reports  history of COVID 19 test positive 1-2 weeks ago. Advise patient to go to ER for further evaluation. He agrees with the plan and will go to ER

## 2020-03-02 NOTE — Telephone Encounter (Signed)
Call returned to patient.  On 02/28/20 Dr. Tasia Catchings suggested patient get a CT but he was not interested in getting at the time.  He would like to have the CT scan scheduled now.  Please schedule STAT CT scan and inform patient of appt detail.

## 2020-03-02 NOTE — Telephone Encounter (Signed)
Patient called reporting that he was told by Dr Hall Busing that Dr Tasia Catchings was going to call him and he states he has not heard anything form her.

## 2020-03-02 NOTE — Telephone Encounter (Signed)
Done STAT CT has been sched as requested Pt is aware is on his way to [facility] CT1

## 2020-03-03 ENCOUNTER — Observation Stay
Admission: EM | Admit: 2020-03-03 | Discharge: 2020-03-04 | Disposition: A | Discharge status: Home / Self Care | Payer: Medicare Other | Attending: Internal Medicine | Admitting: Internal Medicine

## 2020-03-03 ENCOUNTER — Other Ambulatory Visit: Payer: Self-pay

## 2020-03-03 ENCOUNTER — Emergency Department: Payer: Medicare Other

## 2020-03-03 DIAGNOSIS — I10 Essential (primary) hypertension: Secondary | ICD-10-CM | POA: Diagnosis present

## 2020-03-03 DIAGNOSIS — R531 Weakness: Secondary | ICD-10-CM

## 2020-03-03 DIAGNOSIS — J9601 Acute respiratory failure with hypoxia: Secondary | ICD-10-CM | POA: Insufficient documentation

## 2020-03-03 DIAGNOSIS — R0600 Dyspnea, unspecified: Secondary | ICD-10-CM

## 2020-03-03 DIAGNOSIS — J1282 Pneumonia due to coronavirus disease 2019: Secondary | ICD-10-CM | POA: Insufficient documentation

## 2020-03-03 DIAGNOSIS — U071 COVID-19: Secondary | ICD-10-CM | POA: Diagnosis not present

## 2020-03-03 DIAGNOSIS — E785 Hyperlipidemia, unspecified: Secondary | ICD-10-CM | POA: Diagnosis present

## 2020-03-03 DIAGNOSIS — R0902 Hypoxemia: Secondary | ICD-10-CM

## 2020-03-03 DIAGNOSIS — R0602 Shortness of breath: Secondary | ICD-10-CM | POA: Diagnosis present

## 2020-03-03 DIAGNOSIS — C20 Malignant neoplasm of rectum: Secondary | ICD-10-CM | POA: Diagnosis present

## 2020-03-03 DIAGNOSIS — I2510 Atherosclerotic heart disease of native coronary artery without angina pectoris: Secondary | ICD-10-CM | POA: Insufficient documentation

## 2020-03-03 DIAGNOSIS — R0609 Other forms of dyspnea: Secondary | ICD-10-CM

## 2020-03-03 DIAGNOSIS — Z85828 Personal history of other malignant neoplasm of skin: Secondary | ICD-10-CM | POA: Diagnosis not present

## 2020-03-03 LAB — CBC WITH DIFFERENTIAL/PLATELET
Abs Immature Granulocytes: 0.03 10*3/uL (ref 0.00–0.07)
Basophils Absolute: 0.1 10*3/uL (ref 0.0–0.1)
Basophils Relative: 1 %
Eosinophils Absolute: 0.5 10*3/uL (ref 0.0–0.5)
Eosinophils Relative: 5 %
HCT: 45.8 % (ref 39.0–52.0)
Hemoglobin: 15.6 g/dL (ref 13.0–17.0)
Immature Granulocytes: 0 %
Lymphocytes Relative: 18 %
Lymphs Abs: 1.8 10*3/uL (ref 0.7–4.0)
MCH: 34.2 pg — ABNORMAL HIGH (ref 26.0–34.0)
MCHC: 34.1 g/dL (ref 30.0–36.0)
MCV: 100.4 fL — ABNORMAL HIGH (ref 80.0–100.0)
Monocytes Absolute: 1.2 10*3/uL — ABNORMAL HIGH (ref 0.1–1.0)
Monocytes Relative: 12 %
Neutro Abs: 6.5 10*3/uL (ref 1.7–7.7)
Neutrophils Relative %: 64 %
Platelets: 158 10*3/uL (ref 150–400)
RBC: 4.56 MIL/uL (ref 4.22–5.81)
RDW: 13.5 % (ref 11.5–15.5)
WBC: 10 10*3/uL (ref 4.0–10.5)
nRBC: 0 % (ref 0.0–0.2)

## 2020-03-03 LAB — COMPREHENSIVE METABOLIC PANEL
ALT: 33 U/L (ref 0–44)
AST: 44 U/L — ABNORMAL HIGH (ref 15–41)
Albumin: 3.7 g/dL (ref 3.5–5.0)
Alkaline Phosphatase: 186 U/L — ABNORMAL HIGH (ref 38–126)
Anion gap: 10 (ref 5–15)
BUN: 8 mg/dL (ref 8–23)
CO2: 25 mmol/L (ref 22–32)
Calcium: 9.5 mg/dL (ref 8.9–10.3)
Chloride: 102 mmol/L (ref 98–111)
Creatinine, Ser: 0.89 mg/dL (ref 0.61–1.24)
GFR, Estimated: >60 mL/min (ref >60)
Glucose, Bld: 123 mg/dL — ABNORMAL HIGH (ref 70–99)
Potassium: 4.2 mmol/L (ref 3.5–5.1)
Sodium: 137 mmol/L (ref 135–145)
Total Bilirubin: 0.8 mg/dL (ref 0.3–1.2)
Total Protein: 8 g/dL (ref 6.5–8.1)

## 2020-03-03 LAB — BRAIN NATRIURETIC PEPTIDE: B Natriuretic Peptide: 84.1 pg/mL (ref 0.0–100.0)

## 2020-03-03 LAB — LACTIC ACID, PLASMA: Lactic Acid, Venous: 1.5 mmol/L (ref 0.5–1.9)

## 2020-03-03 LAB — MAGNESIUM: Magnesium: 2.3 mg/dL (ref 1.7–2.4)

## 2020-03-03 LAB — RESPIRATORY PANEL BY RT PCR (FLU A&B, COVID)
Influenza A by PCR: NEGATIVE
Influenza B by PCR: NEGATIVE
SARS Coronavirus 2 by RT PCR: NEGATIVE

## 2020-03-03 LAB — TROPONIN I (HIGH SENSITIVITY): Troponin I (High Sensitivity): 12 ng/L (ref <18)

## 2020-03-03 MED ORDER — ACETAMINOPHEN 325 MG PO TABS: 650.0000 mg | ORAL_TABLET | Freq: Four times a day (QID) | ORAL | Status: DC | PRN

## 2020-03-03 MED ORDER — METHYLPREDNISOLONE SODIUM SUCC 40 MG IJ SOLR
40.0000 mg | Freq: Two times a day (BID) | INTRAMUSCULAR | Status: DC
  Administered 2020-03-03 – 2020-03-04 (×2): 40 mg via INTRAVENOUS
  Filled 2020-03-03 (×2): qty 1

## 2020-03-03 MED ORDER — ALBUTEROL SULFATE HFA 108 (90 BASE) MCG/ACT IN AERS
2.0000 | INHALATION_SPRAY | RESPIRATORY_TRACT | Status: DC | PRN
  Filled 2020-03-03: qty 6.7

## 2020-03-03 MED ORDER — DEXAMETHASONE SODIUM PHOSPHATE 10 MG/ML IJ SOLN: 6.0000 mg | Freq: Once | INTRAMUSCULAR | Status: DC

## 2020-03-03 MED ORDER — AZITHROMYCIN 500 MG PO TABS
250.0000 mg | ORAL_TABLET | Freq: Every day | ORAL | Status: DC
  Administered 2020-03-04: 11:00:00 250 mg via ORAL
  Filled 2020-03-03: qty 1

## 2020-03-03 MED ORDER — ATORVASTATIN CALCIUM 20 MG PO TABS
20.0000 mg | ORAL_TABLET | Freq: Every evening | ORAL | Status: DC
  Administered 2020-03-03: 20 mg via ORAL
  Filled 2020-03-03: qty 1

## 2020-03-03 MED ORDER — ENOXAPARIN SODIUM 60 MG/0.6ML ~~LOC~~ SOLN
50.0000 mg | SUBCUTANEOUS | Status: DC
  Administered 2020-03-03: 50 mg via SUBCUTANEOUS
  Filled 2020-03-03 (×2): qty 0.6

## 2020-03-03 MED ORDER — METOPROLOL TARTRATE 25 MG PO TABS
25.0000 mg | ORAL_TABLET | Freq: Two times a day (BID) | ORAL | Status: DC
  Administered 2020-03-04: 11:00:00 25 mg via ORAL
  Filled 2020-03-03: qty 1

## 2020-03-03 MED ORDER — DM-GUAIFENESIN ER 30-600 MG PO TB12: 1.0000 | ORAL_TABLET | Freq: Two times a day (BID) | ORAL | Status: DC | PRN

## 2020-03-03 MED ORDER — IPRATROPIUM BROMIDE HFA 17 MCG/ACT IN AERS
2.0000 | INHALATION_SPRAY | RESPIRATORY_TRACT | Status: DC
  Administered 2020-03-03 – 2020-03-04 (×4): 2 via RESPIRATORY_TRACT
  Filled 2020-03-03 (×2): qty 12.9

## 2020-03-03 MED ORDER — INFLUENZA VAC A&B SA ADJ QUAD 0.5 ML IM PRSY: 0.5000 mL | PREFILLED_SYRINGE | INTRAMUSCULAR | Status: DC

## 2020-03-03 MED ORDER — ONDANSETRON HCL 4 MG/2ML IJ SOLN: 4.0000 mg | Freq: Three times a day (TID) | INTRAMUSCULAR | Status: DC | PRN

## 2020-03-03 MED ORDER — AZITHROMYCIN 500 MG PO TABS
500.0000 mg | ORAL_TABLET | Freq: Every day | ORAL | Status: AC
  Administered 2020-03-03: 500 mg via ORAL
  Filled 2020-03-03: qty 1

## 2020-03-03 MED ORDER — TEMAZEPAM 7.5 MG PO CAPS
30.0000 mg | ORAL_CAPSULE | Freq: Every day | ORAL | Status: DC
  Administered 2020-03-03: 22:00:00 30 mg via ORAL
  Filled 2020-03-03: qty 1
  Filled 2020-03-03: qty 4

## 2020-03-03 NOTE — ED Notes (Signed)
Oxygen saturation when ambulating dropped down to 79%. Pt remained in the 80's for several mins. After getting back in bed and resting pt saturation increased to 97% on room air.

## 2020-03-03 NOTE — Progress Notes (Signed)
PHARMACIST - PHYSICIAN COMMUNICATION  CONCERNING:  Enoxaparin (Lovenox) for DVT Prophylaxis    RECOMMENDATION: Patient was prescribed enoxaprin 73m q24 hours for VTE prophylaxis.   Filed Weights   03/03/20 0847  Weight: 99.8 kg (220 lb)    Body mass index is 32.49 kg/m.  Estimated Creatinine Clearance: 83.5 mL/min (by C-G formula based on SCr of 0.89 mg/dL).   Based on CFlorispatient is candidate for enoxaparin 0.572mkg TBW SQ every 24 hours based on BMI being >30.  DESCRIPTION: Pharm[facility]  Patient is now receiving enoxaparin 50 mg every 24 hours    Dejon Jungman, PharmD Clinical Pharmacist  03/03/2020 3:08 PM

## 2020-03-03 NOTE — H&P (Signed)
History and Physical    PORTER MOES PHK:[id number] DOB: October 30, 1945 DOA: 03/03/2020  Referring MD/NP/PA:   PCP: Albina Billet, MD   Patient coming from:  The patient is coming from home.  At baseline, pt is independent for most of ADL.        Chief Complaint: SOB  HPI: Garrett Peterson is a 74 y.o. male with medical history significant of hypertension, hyperlipidemia, prediabetes, CAD, rectal cancer (s/p resection with primary re-anastomosis by Dr. Peyton Najjar and patient is currently on chemotherapy via right chest port), who presents with shortness of breath.  Pt states that he had positive COVID-19 test 3 weeks ago, and finished 10-day course of antibiotics.  He was tested again last week which was negative.  Patient states that he felt better for a few days, then developed worsening shortness of breath.  He states that he has increased shortness of breath with exertion ambulation.  He has dry cough, no chest pain, fever or chills.  No nausea, vomiting, diarrhea or abdominal pain.  No symptoms of UTI or unilateral weakness. He states that he is fully vaccinated for COVID-19, including a booster shot. Patient has oxygen desaturation to 79% on ambulation, but improved to 97% on resting.    ED Course: pt was found to have negative Covid PCR, electrolytes renal function okay, WBC 10.0, lactic acid 1.5, troponin level 12, BNP 84, temperature normal, blood pressure 123/58, heart rate 54, RR 33 -->23.  Chest x-ray showed bilateral patchy infiltration. Pt is placed on MedSurg bed for observation.   Review of Systems:   General: no fevers, chills, no body weight gain, has fatigue HEENT: no blurry vision, hearing changes or sore throat Respiratory: has dyspnea, coughing, no wheezing CV: no chest pain, no palpitations GI: no nausea, vomiting, abdominal pain, diarrhea, constipation GU: no dysuria, burning on urination, increased urinary frequency, hematuria  Ext: no leg edema Neuro: no unilateral  weakness, numbness, or tingling, no vision change or hearing loss Skin: no rash, no skin tear. MSK: No muscle spasm, no deformity, no limitation of range of movement in spin Heme: No easy bruising.  Travel history: No recent long distant travel.  Allergy: No Known Allergies  Past Medical History:  Diagnosis Date  . Actinic keratosis   . Arthritis   . Basal cell carcinoma   . CAD (coronary artery disease)   . Cancer ([place]) 2021   rectal  . Hyperlipidemia   . Pre-diabetes     Past Surgical History:  Procedure Laterality Date  . APPENDECTOMY    . CARDIAC CATHETERIZATION    . COLONOSCOPY    . CORONARY ARTERY BYPASS GRAFT    . IR SINUS/FIST TUBE CHK-NON GI  10/27/2019  . open heart surgery    . PORTACATH PLACEMENT Right 10/13/2019   Procedure: INSERTION PORT-A-CATH;  Surgeon: Herbert Pun, MD;  Location: [facility] ORS;  Service: General;  Laterality: Right;    Social History:  reports that he has never smoked. He has never used smokeless tobacco. He reports previous alcohol use. He reports that he does not use drugs.  Family History:  Family History  Problem Relation Age of Onset  . Lung disease Father      Prior to Admission medications   Medication Sig Start Date End Date Taking? Authorizing Provider  atorvastatin (LIPITOR) 20 MG tablet Take 20 mg by mouth every evening.     [provider]  lidocaine-prilocaine (EMLA) cream Apply to affected area once 10/05/19   Tasia Catchings,  Talbert Cage, MD  metoprolol tartrate (LOPRESSOR) 25 MG tablet Take 25 mg by mouth 2 (two) times daily. 11/03/19   [provider]  polyethylene glycol (MIRALAX) 17 g packet Take 17 g by mouth daily as needed for severe constipation. 11/09/19   Earlie Server, MD  prochlorperazine (COMPAZINE) 10 MG tablet Take 1 tablet (10 mg total) by mouth every 6 (six) hours as needed (Nausea or vomiting). 10/05/19   Earlie Server, MD  senna (SENOKOT) 8.6 MG TABS tablet Take 2 tablets (17.2 mg total) by mouth daily. 11/09/19    Earlie Server, MD  sertraline (ZOLOFT) 25 MG tablet Take 1 tablet (25 mg total) by mouth daily. 02/02/20   Earlie Server, MD  temazepam (RESTORIL) 30 MG capsule Take 30 mg by mouth at bedtime.  06/09/14   [provider]    Physical Exam: Vitals:   03/03/20 1029 03/03/20 1030 03/03/20 1031 03/03/20 1300  BP:  (!) 123/58  (!) 116/59  Pulse: (!) 57 (!) 58 (!) 54 (!) 57  Resp: (!) 33 (!) 27 (!) 23 (!) 21  Temp:      TempSrc:      SpO2: 95% 95% 97% 95%  Weight:      Height:       General: Not in acute distress HEENT:       Eyes: PERRL, EOMI, no scleral icterus.       ENT: No discharge from the ears and nose, no pharynx injection, no tonsillar enlargement.        Neck: No JVD, no bruit, no mass felt. Heme: No neck lymph node enlargement. Cardiac: S1/S2, RRR, No murmurs, No gallops or rubs. Respiratory: has coarse breathing sound bilaterally GI: Soft, nondistended, nontender, no rebound pain, no organomegaly, BS present. GU: No hematuria Ext: No pitting leg edema bilaterally. 2+DP/PT pulse bilaterally. Musculoskeletal: No joint deformities, No joint redness or warmth, no limitation of ROM in spin. Skin: No rashes.  Neuro: Alert, oriented X3, cranial nerves II-XII grossly intact, moves all extremities normally.  Psych: Patient is not psychotic, no suicidal or hemocidal ideation.  Labs on Admission: I have personally reviewed following labs and imaging studies  CBC: Recent Labs  Lab 03/03/20 0930  WBC 10.0  NEUTROABS 6.5  HGB 15.6  HCT 45.8  MCV 100.4*  PLT 240   Basic Metabolic Panel: Recent Labs  Lab 03/03/20 0930  NA 137  K 4.2  CL 102  CO2 25  GLUCOSE 123*  BUN 8  CREATININE 0.89  CALCIUM 9.5  MG 2.3   GFR: Estimated Creatinine Clearance: 83.5 mL/min (by C-G formula based on SCr of 0.89 mg/dL). Liver Function Tests: Recent Labs  Lab 03/03/20 0930  AST 44*  ALT 33  ALKPHOS 186*  BILITOT 0.8  PROT 8.0  ALBUMIN 3.7   No results for input(s): LIPASE,  AMYLASE in the last 168 hours. No results for input(s): AMMONIA in the last 168 hours. Coagulation Profile: No results for input(s): INR, PROTIME in the last 168 hours. Cardiac Enzymes: No results for input(s): CKTOTAL, CKMB, CKMBINDEX, TROPONINI in the last 168 hours. BNP (last 3 results) No results for input(s): PROBNP in the last 8760 hours. HbA1C: No results for input(s): HGBA1C in the last 72 hours. CBG: No results for input(s): GLUCAP in the last 168 hours. Lipid Profile: No results for input(s): CHOL, HDL, LDLCALC, TRIG, CHOLHDL, LDLDIRECT in the last 72 hours. Thyroid Function Tests: No results for input(s): TSH, T4TOTAL, FREET4, T3FREE, THYROIDAB in the last 72 hours.  Anemia Panel: No results for input(s): VITAMINB12, FOLATE, FERRITIN, TIBC, IRON, RETICCTPCT in the last 72 hours. Urine analysis: No results found for: COLORURINE, APPEARANCEUR, LABSPEC, [place], GLUCOSEU, HGBUR, BILIRUBINUR, KETONESUR, PROTEINUR, UROBILINOGEN, NITRITE, LEUKOCYTESUR Sepsis Labs: _0 (procalcitonin:4,lacticidven:4) ) Recent Results (from the past 240 hour(s))  Respiratory Panel by RT PCR (Flu A&B, Covid) - Nasopharyngeal Swab     Status: None   Collection Time: 03/03/20  9:30 AM   Specimen: Nasopharyngeal Swab  Result Value Ref Range Status   SARS Coronavirus 2 by RT PCR NEGATIVE NEGATIVE Final    Comment: (NOTE) SARS-CoV-2 target nucleic acids are NOT DETECTED.  The SARS-CoV-2 RNA is generally detectable in upper respiratoy specimens during the acute phase of infection. The lowest concentration of SARS-CoV-2 viral copies this assay can detect is 131 copies/mL. A negative result does not preclude SARS-Cov-2 infection and should not be used as the sole basis for treatment or other patient management decisions. A negative result may occur with  improper specimen collection/handling, submission of specimen other than nasopharyngeal swab, presence of viral mutation(s) within the areas  targeted by this assay, and inadequate number of viral copies (<131 copies/mL). A negative result must be combined with clinical observations, patient history, and epidemiological information. The expected result is Negative.  Fact Sheet for Patients:  PinkCheek.be  Fact Sheet for Healthcare Providers:  GravelBags.it  This test is no t yet approved or cleared by the [place] FDA and  has been authorized for detection and/or diagnosis of SARS-CoV-2 by FDA under an Emergency Use Authorization (EUA). This EUA will remain  in effect (meaning this test can be used) for the duration of the COVID-19 declaration under Section 564(b)(1) of the Act, 21 U.S.C. section 360bbb-3(b)(1), unless the authorization is terminated or revoked sooner.     Influenza A by PCR NEGATIVE NEGATIVE Final   Influenza B by PCR NEGATIVE NEGATIVE Final    Comment: (NOTE) The Xpert Xpress SARS-CoV-2/FLU/RSV assay is intended as an aid in  the diagnosis of influenza from Nasopharyngeal swab specimens and  should not be used as a sole basis for treatment. Nasal washings and  aspirates are unacceptable for Xpert Xpress SARS-CoV-2/FLU/RSV  testing.  Fact Sheet for Patients: PinkCheek.be  Fact Sheet for Healthcare Providers: GravelBags.it  This test is not yet approved or cleared by the [place] FDA and  has been authorized for detection and/or diagnosis of SARS-CoV-2 by  FDA under an Emergency Use Authorization (EUA). This EUA will remain  in effect (meaning this test can be used) for the duration of the  Covid-19 declaration under Section 564(b)(1) of the Act, 21  U.S.C. section 360bbb-3(b)(1), unless the authorization is  terminated or revoked. Performed at [facility], [address] [place], [place] [address]      Radiological Exams on Admission: DG Chest 2  View  Result Date: 03/03/2020 CLINICAL DATA:  Shortness of breath EXAM: CHEST - 2 VIEW COMPARISON:  03/01/2020 FINDINGS: Right-sided chest port remains in place. Prior CABG. Stable heart size. Diffuse patchy airspace opacities most pronounced within the perihilar and bibasilar regions, not appreciably progressed compared to the previous study. Trace left pleural effusion. No pneumothorax. IMPRESSION: Persistent diffuse patchy airspace opacities, not appreciably progressed compared to the previous study. Electronically Signed   By: Davina Poke D.O.   On: 03/03/2020 09:43   CT CHEST ABDOMEN PELVIS W CONTRAST  Result Date: 03/02/2020 CLINICAL DATA:  History of rectal cancer. Multiple opacities on chest x-ray. EXAM: CT CHEST, ABDOMEN, AND  PELVIS WITH CONTRAST TECHNIQUE: Multidetector CT imaging of the chest, abdomen and pelvis was performed following the standard protocol during bolus administration of intravenous contrast. CONTRAST:  172m OMNIPAQUE IOHEXOL 300 MG/ML  SOLN COMPARISON:  10/27/2019 FINDINGS: CT CHEST FINDINGS Cardiovascular: Prior CABG. Cardiomegaly. Diffuse aortic atherosclerosis. Mediastinum/Nodes: Borderline size mediastinal lymph nodes with a precarinal lymph node measuring 10 mm in short axis diameter. This is not significantly changed since prior chest CT. No axillary or hilar adenopathy. Lungs/Pleura: Extensive ground-glass airspace opacities throughout the lungs, worsening since prior studies. There appears to be bronchiectasis in the left lower lobe. No effusions. Airspace opacities are most prevalent in the lower lobes. Musculoskeletal: Chest wall soft tissues are unremarkable. No acute bony abnormality. CT ABDOMEN PELVIS FINDINGS Hepatobiliary: No focal liver abnormality is seen. Status post cholecystectomy. No biliary dilatation. Pancreas: No focal abnormality or ductal dilatation. Spleen: No focal abnormality.  Normal size. Adrenals/Urinary Tract: Large cyst in the left kidney  measuring 8.9 cm. No hydronephrosis. No visible stones or hydronephrosis. Adrenal glands and urinary bladder unremarkable. Stomach/Bowel: Postoperative changes in the rectosigmoid colon. Sigmoid diverticulosis. Large stool burden throughout the colon. Stomach and small bowel decompressed, unremarkable. Vascular/Lymphatic: Aortic atherosclerosis. No evidence of aneurysm or adenopathy. Reproductive: Prostate enlargement with central calcifications. Other: No free fluid or free air. Bilateral inguinal hernias containing fat. Musculoskeletal: Degenerative changes in the lumbar spine. No acute bony abnormality. IMPRESSION: Extensive ground-glass airspace opacities throughout the lungs, worsening since prior study, most pronounced in the mid and lower lung zones. This is most concerning for infectious or inflammatory process. COVID pneumonia could have this appearance or inflammatory fibrosis following COVID pneumonia. Other inflammatory infectious processes are also possible. Trace left effusion. Aortic atherosclerosis. Large stool burden in the colon. Postoperative changes in the rectosigmoid colon without complicating feature. Sigmoid diverticulosis. Bilateral inguinal hernias containing fat. Prominent prostate with central calcifications. Electronically Signed   By: KRolm BaptiseM.D.   On: 03/02/2020 18:40     EKG: I have personally reviewed.  Sinus rhythm, QTC 420, bradycardia with heart rate 58, nonspecific T wave change.   Assessment/Plan Principal Problem:   Acute respiratory failure with hypoxia (HCC) Active Problems:   CAD (coronary artery disease)   Hyperlipidemia   Rectal cancer (HCC)   Pneumonia due to COVID-19 virus   HTN (hypertension)   Acute respiratory failure with hypoxia due to pneumonia due to COVID-19 virus: Covid PCR negative today, but the chest x-ray showed persistent patchy infiltration bilaterally.  Patient does not have any chest pain.  Low suspicions of for PE.  -Placed on  MedSurg [place] for ablation -Started Z-Pak empirically -Bronchodilators -Plan Mucinex for cough -Start Solu-Medrol 40 mg twice daily  CAD (coronary artery disease): No chest pain -Continue Lipitor, metoprolol  Hyperlipidemia -Lipitor  Rectal cancer ([facility]: On chemotherapy currently -Follow-up with oncologist, Dr. YTasia Catchings HTN (hypertension) -Continue metoprolol          DVT ppx: SQ Lovenox Code Status: Full code Family Communication:   Yes, patient's wife at bed side Disposition Plan:  Anticipate discharge back to previous environment Consults called:  none Admission status: Med-surg bed for obs   Status is: Observation  The patient remains OBS appropriate and will d/c before 2 midnights.  Dispo: The patient is from: Home              Anticipated d/c is to: Home              Anticipated d/c date is: 1 day  Patient currently is not medically stable to d/c.          Date of Service 03/03/2020    Ivor Costa [facility]   If 7PM-7AM, please contact night-coverage [email or web address] 03/03/2020, 1:52 PM

## 2020-03-03 NOTE — ED Provider Notes (Signed)
[facility] Emergency Department Provider Note ____________________________________________   First MD Initiated Contact with Patient 03/03/20 [phone]     (approximate)  I have reviewed the triage vital signs and the nursing notes.  HISTORY  Chief Complaint Weakness   HPI Garrett Peterson is a 74 y.o. malewho presents to the ED for evaluation of shortness of breath and weakness.   Chart review indicates history of rectal cancer s/p resection with primary re-anastomosis by Dr. Peyton Najjar and patient is currently on chemotherapy via right chest port. Patient reports being diagnosed with pneumonia 3 weeks ago, and finishing a 10-day course of antibiotics.  Patient reports he felt transiently better for a few days, before reworsening from a respiratory standpoint.  Patient reports shortness of breath is his only complaint.  He reports increased shortness of breath with ambulation and exertion, such that he can only take a few steps before feeling out of breath, and this resolves when he sits down.  Patient reports testing positive for COVID-19 2 weeks ago, and then tested negative this past week.  Patient reports uncertainty about this as he is fully vaccinated for COVID-19, including a booster shot.    Patient denies fevers, syncope, fall/trauma, increased lower extremity swelling, orthopnea, chest pain, back pain, vomiting, cough.  Denies increase in abdominal pain or changes to his stool habits.  Only complaint is shortness of breath.    Past Medical History:  Diagnosis Date  . Actinic keratosis   . Arthritis   . Basal cell carcinoma   . CAD (coronary artery disease)   . Cancer ([place]) 2021   rectal  . Hyperlipidemia   . Pre-diabetes     Patient Active Problem List   Diagnosis Date Noted  . Acute respiratory failure with hypoxia ([place]) 03/03/2020  . Pneumonia due to COVID-19 virus 03/03/2020  . Thrombocytopenia ([place]) 12/29/2019  . Port-A-Cath in place 11/02/2019   . Encounter for antineoplastic chemotherapy 11/02/2019  . Rectal cancer ([place]) 09/28/2019  . Colon cancer ([place]) 09/20/2019  . Goals of care, counseling/discussion 09/12/2019  . CAD (coronary artery disease) 07/15/2018  . Hx of adenomatous colonic polyps 07/15/2018  . Hyperlipidemia 07/15/2018  . Personal history of other malignant neoplasm of skin 06/27/2014    Past Surgical History:  Procedure Laterality Date  . APPENDECTOMY    . CARDIAC CATHETERIZATION    . COLONOSCOPY    . CORONARY ARTERY BYPASS GRAFT    . IR SINUS/FIST TUBE CHK-NON GI  10/27/2019  . open heart surgery    . PORTACATH PLACEMENT Right 10/13/2019   Procedure: INSERTION PORT-A-CATH;  Surgeon: Herbert Pun, MD;  Location: [facility] ORS;  Service: General;  Laterality: Right;    Prior to Admission medications   Medication Sig Start Date End Date Taking? Authorizing Provider  atorvastatin (LIPITOR) 20 MG tablet Take 20 mg by mouth every evening.     [provider]  lidocaine-prilocaine (EMLA) cream Apply to affected area once 10/05/19   Earlie Server, MD  metoprolol tartrate (LOPRESSOR) 25 MG tablet Take 25 mg by mouth 2 (two) times daily. 11/03/19   [provider]  polyethylene glycol (MIRALAX) 17 g packet Take 17 g by mouth daily as needed for severe constipation. 11/09/19   Earlie Server, MD  prochlorperazine (COMPAZINE) 10 MG tablet Take 1 tablet (10 mg total) by mouth every 6 (six) hours as needed (Nausea or vomiting). 10/05/19   Earlie Server, MD  senna (SENOKOT) 8.6 MG TABS tablet Take 2 tablets (17.2 mg  total) by mouth daily. 11/09/19   Earlie Server, MD  sertraline (ZOLOFT) 25 MG tablet Take 1 tablet (25 mg total) by mouth daily. 02/02/20   Earlie Server, MD  temazepam (RESTORIL) 30 MG capsule Take 30 mg by mouth at bedtime.  06/09/14   [provider]    Allergies Patient has no known allergies.  Family History  Problem Relation Age of Onset  . Lung disease Father     Social History Social History    Tobacco Use  . Smoking status: Never Smoker  . Smokeless tobacco: Never Used  Vaping Use  . Vaping Use: Never used  Substance Use Topics  . Alcohol use: Not Currently    Alcohol/week: 0.0 standard drinks  . Drug use: Never    Review of Systems  Constitutional: No fever/chills Eyes: No visual changes. ENT: No sore throat. Cardiovascular: Denies chest pain. Respiratory: Positive for shortness of breath. Gastrointestinal: No abdominal pain.  No nausea, no vomiting.  No diarrhea.  No constipation. Genitourinary: Negative for dysuria. Musculoskeletal: Negative for back pain. Skin: Negative for rash. Neurological: Negative for headaches, focal weakness or numbness.  ____________________________________________   PHYSICAL EXAM:  VITAL SIGNS: Vitals:   03/03/20 1030 03/03/20 1031  BP: (!) 123/58   Pulse: (!) 58 (!) 54  Resp: (!) 27 (!) 23  Temp:    SpO2: 95% 97%      Constitutional: Alert and oriented.  Well-appearing and conversational in short phrases only due to dyspnea.  Pleasant. Eyes: Conjunctivae are normal. PERRL. EOMI. Head: Atraumatic. Nose: No congestion/rhinnorhea. Mouth/Throat: Mucous membranes are dry.  Oropharynx non-erythematous. Neck: No stridor. No cervical spine tenderness to palpation. Cardiovascular: Normal rate, regular rhythm. Grossly normal heart sounds.  Good peripheral circulation. Respiratory: Dyspneic and tachypneic with respiratory rate of about 30.  Speaking in short phrases only..  No retractions. Lungs CTAB. Gastrointestinal: Soft , nondistended, nontender to palpation. No abdominal bruits. No CVA tenderness. Musculoskeletal: No lower extremity tenderness nor edema.  No joint effusions. No signs of acute trauma. Neurologic:  Normal speech and language. No gross focal neurologic deficits are appreciated. No gait instability noted. Skin:  Skin is warm, dry and intact. No rash noted. Psychiatric: Mood and affect are normal. Speech and  behavior are normal.  ____________________________________________   LABS (all labs ordered are listed, but only abnormal results are displayed)  Labs Reviewed  CBC WITH DIFFERENTIAL/PLATELET - Abnormal; Notable for the following components:      Result Value   MCV 100.4 (*)    MCH 34.2 (*)    Monocytes Absolute 1.2 (*)    All other components within normal limits  COMPREHENSIVE METABOLIC PANEL - Abnormal; Notable for the following components:   Glucose, Bld 123 (*)    AST 44 (*)    Alkaline Phosphatase 186 (*)    All other components within normal limits  RESPIRATORY PANEL BY RT PCR (FLU A&B, COVID)  LACTIC ACID, PLASMA  BRAIN NATRIURETIC PEPTIDE  MAGNESIUM  TROPONIN I (HIGH SENSITIVITY)   ____________________________________________  12 Lead EKG  Sinus rhythm, rate of 58 bpm.  Normal axis and intervals.  No evidence of acute ischemia.  Sinus bradycardia. ____________________________________________  RADIOLOGY  ED MD interpretation: 2 view CXR reviewed by me with patchy multifocal infiltrates consistent with COVID-19 without discrete lobar filtration to suggest bacterial pneumonia.  Official radiology report(s): DG Chest 2 View  Result Date: 03/03/2020 CLINICAL DATA:  Shortness of breath EXAM: CHEST - 2 VIEW COMPARISON:  03/01/2020 FINDINGS: Right-sided chest  port remains in place. Prior CABG. Stable heart size. Diffuse patchy airspace opacities most pronounced within the perihilar and bibasilar regions, not appreciably progressed compared to the previous study. Trace left pleural effusion. No pneumothorax. IMPRESSION: Persistent diffuse patchy airspace opacities, not appreciably progressed compared to the previous study. Electronically Signed   By: Davina Poke D.O.   On: 03/03/2020 09:43   CT CHEST ABDOMEN PELVIS W CONTRAST  Result Date: 03/02/2020 CLINICAL DATA:  History of rectal cancer. Multiple opacities on chest x-ray. EXAM: CT CHEST, ABDOMEN, AND PELVIS WITH  CONTRAST TECHNIQUE: Multidetector CT imaging of the chest, abdomen and pelvis was performed following the standard protocol during bolus administration of intravenous contrast. CONTRAST:  131m OMNIPAQUE IOHEXOL 300 MG/ML  SOLN COMPARISON:  10/27/2019 FINDINGS: CT CHEST FINDINGS Cardiovascular: Prior CABG. Cardiomegaly. Diffuse aortic atherosclerosis. Mediastinum/Nodes: Borderline size mediastinal lymph nodes with a precarinal lymph node measuring 10 mm in short axis diameter. This is not significantly changed since prior chest CT. No axillary or hilar adenopathy. Lungs/Pleura: Extensive ground-glass airspace opacities throughout the lungs, worsening since prior studies. There appears to be bronchiectasis in the left lower lobe. No effusions. Airspace opacities are most prevalent in the lower lobes. Musculoskeletal: Chest wall soft tissues are unremarkable. No acute bony abnormality. CT ABDOMEN PELVIS FINDINGS Hepatobiliary: No focal liver abnormality is seen. Status post cholecystectomy. No biliary dilatation. Pancreas: No focal abnormality or ductal dilatation. Spleen: No focal abnormality.  Normal size. Adrenals/Urinary Tract: Large cyst in the left kidney measuring 8.9 cm. No hydronephrosis. No visible stones or hydronephrosis. Adrenal glands and urinary bladder unremarkable. Stomach/Bowel: Postoperative changes in the rectosigmoid colon. Sigmoid diverticulosis. Large stool burden throughout the colon. Stomach and small bowel decompressed, unremarkable. Vascular/Lymphatic: Aortic atherosclerosis. No evidence of aneurysm or adenopathy. Reproductive: Prostate enlargement with central calcifications. Other: No free fluid or free air. Bilateral inguinal hernias containing fat. Musculoskeletal: Degenerative changes in the lumbar spine. No acute bony abnormality. IMPRESSION: Extensive ground-glass airspace opacities throughout the lungs, worsening since prior study, most pronounced in the mid and lower lung zones.  This is most concerning for infectious or inflammatory process. COVID pneumonia could have this appearance or inflammatory fibrosis following COVID pneumonia. Other inflammatory infectious processes are also possible. Trace left effusion. Aortic atherosclerosis. Large stool burden in the colon. Postoperative changes in the rectosigmoid colon without complicating feature. Sigmoid diverticulosis. Bilateral inguinal hernias containing fat. Prominent prostate with central calcifications. Electronically Signed   By: KRolm BaptiseM.D.   On: 03/02/2020 18:40    ____________________________________________   PROCEDURES and INTERVENTIONS  Procedure(s) performed (including Critical Care):  .1-3 Lead EKG Interpretation Performed by: SVladimir Crofts MD Authorized by: SVladimir Crofts MD     Interpretation: normal     ECG rate:  64   ECG rate assessment: normal     Rhythm: sinus rhythm     Ectopy: none     Conduction: normal      Medications  dexamethasone (DECADRON) injection 6 mg (has no administration in time range)    ____________________________________________   MDM / ED COURSE  74year old man with multiple medical comorbidities presenting with evidence of COVID-19 despite negative testing, with associated hypoxia with ambulation, and requiring medical admission.  Patient with normal vital signs while seated, but desaturated quickly with ambulation to the upper 70s.  Exam demonstrates a well-appearing patient without evidence of acute pathology while seated.  Blood work is unremarkable and without evidence of additional systemic acute pathology.  CXR with patchy multifocal infiltrates consistent with COVID-19  without evidence of bacterial or lobar pneumonia.  Due to the degree of his weakness, dyspnea on exertion and hypoxia with ambulation, provide the patient a dose of Decadron to treat severe COVID-19 and will admit the patient to hospitalist medicine for further work-up and management of his  condition.  Clinical Course as of Mar 03 1124  Fri Mar 03, 2020  1122 Educated patient on my clinical suspicion for COVID-19 despite his negative testing.  Long discussion at the bedside with patient and his wife about getting COVID-19 despite being vaccinated in the setting of his advanced age, chronic comorbidities and ongoing chemotherapy.  We discussed how he can have COVID-19 and to test negative.  Answered questions.  We discussed treatment with steroids and admission.  They are agreeable.   [DS]    Clinical Course User Index [DS] Vladimir Crofts, MD     ____________________________________________   FINAL CLINICAL IMPRESSION(S) / ED DIAGNOSES  Final diagnoses:  DOE (dyspnea on exertion)  Generalized weakness  Clinical diagnosis of COVID-19  Hypoxia     ED Discharge Orders    None       Tayonna Bacha Tamala Julian   Note:  This document was prepared using Dragon voice recognition software and may include unintentional dictation errors.   Vladimir Crofts, MD 03/03/20 1140

## 2020-03-03 NOTE — ED Notes (Signed)
Attempted to call report, floor unable to take patient at this time.

## 2020-03-03 NOTE — ED Notes (Signed)
Report given to floor RN

## 2020-03-03 NOTE — ED Triage Notes (Signed)
BIB [facility] from home c/o weakness X 1 week, pt recently had COVID and pneumonia in last 2 weeks. CBG 173, other VSS. Questionable low BP on initial arrival but has been WNL en route. Alert and oriented X 4 at this time. Denies pain.

## 2020-03-04 DIAGNOSIS — U071 COVID-19: Secondary | ICD-10-CM

## 2020-03-04 DIAGNOSIS — J9601 Acute respiratory failure with hypoxia: Secondary | ICD-10-CM

## 2020-03-04 DIAGNOSIS — J1282 Pneumonia due to coronavirus disease 2019: Secondary | ICD-10-CM

## 2020-03-04 DIAGNOSIS — C20 Malignant neoplasm of rectum: Secondary | ICD-10-CM

## 2020-03-04 DIAGNOSIS — I10 Essential (primary) hypertension: Secondary | ICD-10-CM | POA: Diagnosis not present

## 2020-03-04 DIAGNOSIS — E785 Hyperlipidemia, unspecified: Secondary | ICD-10-CM

## 2020-03-04 LAB — CBC
HCT: 40.5 % (ref 39.0–52.0)
Hemoglobin: 14.1 g/dL (ref 13.0–17.0)
MCH: 34.7 pg — ABNORMAL HIGH (ref 26.0–34.0)
MCHC: 34.8 g/dL (ref 30.0–36.0)
MCV: 99.8 fL (ref 80.0–100.0)
Platelets: 157 10*3/uL (ref 150–400)
RBC: 4.06 MIL/uL — ABNORMAL LOW (ref 4.22–5.81)
RDW: 13.3 % (ref 11.5–15.5)
WBC: 9.8 10*3/uL (ref 4.0–10.5)
nRBC: 0 % (ref 0.0–0.2)

## 2020-03-04 LAB — BASIC METABOLIC PANEL
Anion gap: 9 (ref 5–15)
BUN: 17 mg/dL (ref 8–23)
CO2: 25 mmol/L (ref 22–32)
Calcium: 9.4 mg/dL (ref 8.9–10.3)
Chloride: 102 mmol/L (ref 98–111)
Creatinine, Ser: 0.94 mg/dL (ref 0.61–1.24)
GFR, Estimated: >60 mL/min (ref >60)
Glucose, Bld: 136 mg/dL — ABNORMAL HIGH (ref 70–99)
Potassium: 5.3 mmol/L — ABNORMAL HIGH (ref 3.5–5.1)
Sodium: 136 mmol/L (ref 135–145)

## 2020-03-04 MED ORDER — ALBUTEROL SULFATE HFA 108 (90 BASE) MCG/ACT IN AERS: 2.0000 | INHALATION_SPRAY | Freq: Four times a day (QID) | RESPIRATORY_TRACT | 0 refills | Status: DC | PRN

## 2020-03-04 MED ORDER — PREDNISONE 10 MG PO TABS: 20.0000 mg | ORAL_TABLET | Freq: Every day | ORAL | 0 refills | Status: AC

## 2020-03-04 MED ORDER — AZITHROMYCIN 250 MG PO TABS: 250.0000 mg | ORAL_TABLET | Freq: Every day | ORAL | 0 refills | Status: AC

## 2020-03-04 MED ORDER — DM-GUAIFENESIN ER 30-600 MG PO TB12: 1.0000 | ORAL_TABLET | Freq: Two times a day (BID) | ORAL | 0 refills | Status: DC | PRN

## 2020-03-04 NOTE — Discharge Summary (Signed)
Physician Discharge Summary  LAVARR PRESIDENT TIW:[id number] DOB: [birth year] DOA: 03/03/2020  PCP: Albina Billet, MD  Admit date: 03/03/2020 Discharge date: 03/04/2020  Admitted From: Home  Discharge disposition: Home   Recommendations for Outpatient Follow-Up:   . Follow up with your primary care provider in one week.  . Check CBC, BMP, magnesium in the next visit  Discharge Diagnosis:   Principal Problem:   Acute respiratory failure with hypoxia (HCC) Active Problems:   CAD (coronary artery disease)   Hyperlipidemia   Rectal cancer ([place])   Pneumonia due to COVID-19 virus   HTN (hypertension)   Discharge Condition: Improved.  Diet recommendation: Low sodium, heart healthy.    Wound care: None.  Code status: Full.   History of Present Illness:   Garrett Peterson a 74 y.o.malewith medical history significant ofhypertension, hyperlipidemia, prediabetes, CAD, rectal cancer(s/p resection with primaryre-anastomosis by Dr.Cintronand patient is currently on chemotherapy via right chest port),presented to the hospital with shortness of breath. Patient stated that  hehad positive COVID-19 test 3 weeks ago, and finished 10-day course of treatment.  Repeat testing was negative.  Patient initially felt better but subsequently has started feeling short of breath with dyspnea as well.  He has been fully vaccinated for COVID-19 including booster shot. Patient had oxygen desaturation to79% on ambulation, but improved to 97% on resting.  In the ED, patient was found to have a negative Covid PCR.  Electrolytes are okay.  WBC at 10.0.  Chest x-ray showed bilateral patchy infiltrates.  Patient was then considered for observation in the hospital.   Hospital Course:   Following conditions were addressed during hospitalization as listed below,  Acute hypoxic respiratory failure secondary to recent COVID-19 virus:  Hypoxia mostly exertional but at rest saturating very well.  Patient appears to be completely comfortable at this time.  Not even on supplemental oxygen.  No increased work of breathing.  Chest appears to be clear.  Chest x-ray with infiltrates which is expected at this time..    Patient will be given prednisone p.o., Robitussin-DM, and will be considered for discharge home today. Flu test was negative as well.  Troponin was negative.    Patient was advised against overexertion.  Mild hyperkalemia potassium 5.3.  Borderline high.    Avoid potassium supplements.  Patient will need to follow-up with primary care patient as outpatient.  History of CAD  No chest pain. Continue Lipitor, metoprolol.  Troponins were negative.  Hyperlipidemia Continue Lipitor  Rectal cancer On chemotherapy currently.  Patient follows up with Dr. Tasia Catchings, oncology as outpatient.  HTN  -Continue metoprolol  Disposition.  At this time, patient is stable for disposition home.  Patient will need to follow-up with his primary care physician as outpatient.  Medical Consultants:    None.  Procedures:    None Subjective:   Today, patient feels okay.  Denies any increasing cough or shortness of breath, dyspnea, chest pain.  He feels very well and wishes to go home.  Discharge Exam:   Vitals:   03/04/20 0439 03/04/20 0820  BP: 120/64 120/63  Pulse: 70 79  Resp: (!) 24 18  Temp: 98.1 F (36.7 C) 97.8 F (36.6 C)  SpO2: 96% 95%   Vitals:   03/03/20 2030 03/04/20 0036 03/04/20 0439 03/04/20 0820  BP: 131/61 96/62 120/64 120/63  Pulse: 78 72 70 79  Resp: 19 16 (!) 24 18  Temp: 98 F (36.7 C) 97.6 F (36.4 C) 98.1 F (36.7 C)  97.8 F (36.6 C)  TempSrc: Oral Oral  Oral  SpO2: 93% 95% 96% 95%  Weight:      Height:       General: Alert awake, not in obvious distress HENT: pupils equally reacting to light,  No scleral pallor or icterus noted. Oral mucosa is moist.  Chest:  .  Diminished breath sounds bilaterally. No crackles or wheezes.  CVS: S1 &S2 heard. No  murmur.  Regular rate and rhythm. Abdomen: Soft, nontender, nondistended.  Bowel sounds are heard.   Extremities: No cyanosis, clubbing or edema.  Peripheral pulses are palpable. Psych: Alert, awake and oriented, normal mood CNS:  No cranial nerve deficits.  Power equal in all extremities.   Skin: Warm and dry.  No rashes noted.  The results of significant diagnostics from this hospitalization (including imaging, microbiology, ancillary and laboratory) are listed below for reference.     Diagnostic Studies:   DG Chest 2 View  Result Date: 03/03/2020 CLINICAL DATA:  Shortness of breath EXAM: CHEST - 2 VIEW COMPARISON:  03/01/2020 FINDINGS: Right-sided chest port remains in place. Prior CABG. Stable heart size. Diffuse patchy airspace opacities most pronounced within the perihilar and bibasilar regions, not appreciably progressed compared to the previous study. Trace left pleural effusion. No pneumothorax. IMPRESSION: Persistent diffuse patchy airspace opacities, not appreciably progressed compared to the previous study. Electronically Signed   By: Davina Poke D.O.   On: 03/03/2020 09:43     Labs:   Basic Metabolic Panel: Recent Labs  Lab 03/03/20 0930 03/04/20 0611  NA 137 136  K 4.2 5.3*  CL 102 102  CO2 25 25  GLUCOSE 123* 136*  BUN 8 17  CREATININE 0.89 0.94  CALCIUM 9.5 9.4  MG 2.3  --    GFR Estimated Creatinine Clearance: 79 mL/min (by C-G formula based on SCr of 0.94 mg/dL). Liver Function Tests: Recent Labs  Lab 03/03/20 0930  AST 44*  ALT 33  ALKPHOS 186*  BILITOT 0.8  PROT 8.0  ALBUMIN 3.7   No results for input(s): LIPASE, AMYLASE in the last 168 hours. No results for input(s): AMMONIA in the last 168 hours. Coagulation profile No results for input(s): INR, PROTIME in the last 168 hours.  CBC: Recent Labs  Lab 03/03/20 0930 03/04/20 0611  WBC 10.0 9.8  NEUTROABS 6.5  --   HGB 15.6 14.1  HCT 45.8 40.5  MCV 100.4* 99.8  PLT 158 157    Cardiac Enzymes: No results for input(s): CKTOTAL, CKMB, CKMBINDEX, TROPONINI in the last 168 hours. BNP: Invalid input(s): POCBNP CBG: No results for input(s): GLUCAP in the last 168 hours. D-Dimer No results for input(s): DDIMER in the last 72 hours. Hgb A1c No results for input(s): HGBA1C in the last 72 hours. Lipid Profile No results for input(s): CHOL, HDL, LDLCALC, TRIG, CHOLHDL, LDLDIRECT in the last 72 hours. Thyroid function studies No results for input(s): TSH, T4TOTAL, T3FREE, THYROIDAB in the last 72 hours.  Invalid input(s): FREET3 Anemia work up No results for input(s): VITAMINB12, FOLATE, FERRITIN, TIBC, IRON, RETICCTPCT in the last 72 hours. Microbiology Recent Results (from the past 240 hour(s))  Respiratory Panel by RT PCR (Flu A&B, Covid) - Nasopharyngeal Swab     Status: None   Collection Time: 03/03/20  9:30 AM   Specimen: Nasopharyngeal Swab  Result Value Ref Range Status   SARS Coronavirus 2 by RT PCR NEGATIVE NEGATIVE Final    Comment: (NOTE) SARS-CoV-2 target nucleic acids are NOT DETECTED.  The  SARS-CoV-2 RNA is generally detectable in upper respiratoy specimens during the acute phase of infection. The lowest concentration of SARS-CoV-2 viral copies this assay can detect is 131 copies/mL. A negative result does not preclude SARS-Cov-2 infection and should not be used as the sole basis for treatment or other patient management decisions. A negative result may occur with  improper specimen collection/handling, submission of specimen other than nasopharyngeal swab, presence of viral mutation(s) within the areas targeted by this assay, and inadequate number of viral copies (<131 copies/mL). A negative result must be combined with clinical observations, patient history, and epidemiological information. The expected result is Negative.  Fact Sheet for Patients:  PinkCheek.be  Fact Sheet for Healthcare Providers:   GravelBags.it  This test is no t yet approved or cleared by the [place] FDA and  has been authorized for detection and/or diagnosis of SARS-CoV-2 by FDA under an Emergency Use Authorization (EUA). This EUA will remain  in effect (meaning this test can be used) for the duration of the COVID-19 declaration under Section 564(b)(1) of the Act, 21 U.S.C. section 360bbb-3(b)(1), unless the authorization is terminated or revoked sooner.     Influenza A by PCR NEGATIVE NEGATIVE Final   Influenza B by PCR NEGATIVE NEGATIVE Final    Comment: (NOTE) The Xpert Xpress SARS-CoV-2/FLU/RSV assay is intended as an aid in  the diagnosis of influenza from Nasopharyngeal swab specimens and  should not be used as a sole basis for treatment. Nasal washings and  aspirates are unacceptable for Xpert Xpress SARS-CoV-2/FLU/RSV  testing.  Fact Sheet for Patients: PinkCheek.be  Fact Sheet for Healthcare Providers: GravelBags.it  This test is not yet approved or cleared by the [place] FDA and  has been authorized for detection and/or diagnosis of SARS-CoV-2 by  FDA under an Emergency Use Authorization (EUA). This EUA will remain  in effect (meaning this test can be used) for the duration of the  Covid-19 declaration under Section 564(b)(1) of the Act, 21  U.S.C. section 360bbb-3(b)(1), unless the authorization is  terminated or revoked. Performed at [facility], [address], [place], [place] [address]      Discharge Instructions:   Discharge Instructions    Call MD for:  difficulty breathing, headache or visual disturbances   Complete by: As directed    Call MD for:  temperature >100.4   Complete by: As directed    Diet general   Complete by: As directed    Discharge instructions   Complete by: As directed    Follow up with your primary care provider in 1 week.  Continue to take  medications as prescribed.  Do not overexert.   Increase activity slowly   Complete by: As directed      Allergies as of 03/04/2020   No Known Allergies     Medication List    TAKE these medications   albuterol 108 (90 Base) MCG/ACT inhaler Commonly known as: VENTOLIN HFA Inhale 2 puffs into the lungs every 6 (six) hours as needed for wheezing or shortness of breath.   atorvastatin 20 MG tablet Commonly known as: LIPITOR Take 20 mg by mouth every evening.   azithromycin 250 MG tablet Commonly known as: ZITHROMAX Take 1 tablet (250 mg total) by mouth daily for 3 days. Start taking on: March 05, 2020   dextromethorphan-guaiFENesin 30-600 MG 12hr tablet Commonly known as: MUCINEX DM Take 1 tablet by mouth 2 (two) times daily as needed for cough.   lidocaine-prilocaine cream Commonly  known as: EMLA Apply to affected area once   metoprolol tartrate 25 MG tablet Commonly known as: LOPRESSOR Take 25 mg by mouth 2 (two) times daily.   polyethylene glycol 17 g packet Commonly known as: MiraLax Take 17 g by mouth daily as needed for severe constipation.   predniSONE 10 MG tablet Commonly known as: DELTASONE Take 2 tablets (20 mg total) by mouth daily for 5 days.   prochlorperazine 10 MG tablet Commonly known as: COMPAZINE Take 1 tablet (10 mg total) by mouth every 6 (six) hours as needed (Nausea or vomiting).   senna 8.6 MG Tabs tablet Commonly known as: SENOKOT Take 2 tablets (17.2 mg total) by mouth daily.   sertraline 25 MG tablet Commonly known as: ZOLOFT Take 1 tablet (25 mg total) by mouth daily.   temazepam 30 MG capsule Commonly known as: RESTORIL Take 30 mg by mouth at bedtime.         Time coordinating discharge: 39 minutes  Signed:  Sal Spratley  [facility] 03/04/2020, 11:59 AM

## 2020-03-04 NOTE — Care Management Obs Status (Signed)
[facility] NOTIFICATION   Patient Details  Name: Garrett Peterson MRN: [id number] Date of Birth: December 24, 1945   Medicare Observation Status Notification Given:  Yes    Shelbie Hutching, RN 03/04/2020, 9:54 AM

## 2020-03-04 NOTE — TOC Transition Note (Signed)
Transition of Care [facility]) - CM/SW Discharge Note   Patient Details  Name: Garrett Peterson MRN: [id number] Date of Birth: 04/18/1946  [facility] CM/SW Contact:  Shelbie Hutching, RN Phone Number: 03/04/2020, 12:12 PM   Clinical Narrative:     Patient placed under observation for pneumonia.  Patient is now medically cleared for discharge home.  Patient is independent at home, current with his PCP and uses [facility] for prescriptions.  No discharge needs identified.    Final next level of care: Home/Self Care Barriers to Discharge: No Barriers Identified   Patient Goals and CMS Choice Patient states their goals for this hospitalization and ongoing recovery are:: Wants to follow up with pulmonology      Discharge Placement                       Discharge Plan and Services                                     Social Determinants of Health (SDOH) Interventions     Readmission Risk Interventions No flowsheet data found.

## 2020-03-06 ENCOUNTER — Ambulatory Visit: Payer: Medicare Other

## 2020-03-06 ENCOUNTER — Ambulatory Visit: Admission: RE | Admit: 2020-03-06 | Payer: Medicare Other | Source: Ambulatory Visit

## 2020-03-06 DIAGNOSIS — C20 Malignant neoplasm of rectum: Secondary | ICD-10-CM | POA: Insufficient documentation

## 2020-03-07 ENCOUNTER — Ambulatory Visit: Payer: Medicare Other

## 2020-03-07 NOTE — Telephone Encounter (Signed)
Done  Pts 03/13/20 lab/MD/Folfox/ Pump DC with OnPro on D3 appts  has been postpone one week to allow full recovery of pneumonia per MD Called and made him aware of his NEW sched 03/20/20 and 11/11 appt times

## 2020-03-08 ENCOUNTER — Ambulatory Visit: Payer: Medicare Other

## 2020-03-09 ENCOUNTER — Ambulatory Visit: Payer: Medicare Other

## 2020-03-10 ENCOUNTER — Ambulatory Visit: Payer: Medicare Other

## 2020-03-13 ENCOUNTER — Ambulatory Visit: Payer: Medicare Other | Admitting: Oncology

## 2020-03-13 ENCOUNTER — Ambulatory Visit: Payer: Medicare Other

## 2020-03-13 ENCOUNTER — Other Ambulatory Visit: Payer: Medicare Other

## 2020-03-13 ENCOUNTER — Ambulatory Visit: Admission: RE | Admit: 2020-03-13 | Payer: Medicare Other | Source: Ambulatory Visit

## 2020-03-13 DIAGNOSIS — C20 Malignant neoplasm of rectum: Secondary | ICD-10-CM | POA: Insufficient documentation

## 2020-03-13 DIAGNOSIS — Z510 Encounter for antineoplastic radiation therapy: Secondary | ICD-10-CM | POA: Insufficient documentation

## 2020-03-14 ENCOUNTER — Ambulatory Visit: Payer: Medicare Other

## 2020-03-14 ENCOUNTER — Ambulatory Visit
Admission: RE | Admit: 2020-03-14 | Discharge: 2020-03-14 | Disposition: A | Discharge status: Home / Self Care | Payer: Medicare Other | Source: Ambulatory Visit | Attending: Radiation Oncology | Admitting: Radiation Oncology

## 2020-03-14 DIAGNOSIS — Z510 Encounter for antineoplastic radiation therapy: Secondary | ICD-10-CM | POA: Diagnosis not present

## 2020-03-15 ENCOUNTER — Ambulatory Visit
Admission: RE | Admit: 2020-03-15 | Discharge: 2020-03-15 | Disposition: A | Discharge status: Home / Self Care | Payer: Medicare Other | Source: Ambulatory Visit | Attending: Radiation Oncology | Admitting: Radiation Oncology

## 2020-03-15 ENCOUNTER — Ambulatory Visit: Payer: Medicare Other

## 2020-03-15 DIAGNOSIS — Z510 Encounter for antineoplastic radiation therapy: Secondary | ICD-10-CM | POA: Diagnosis not present

## 2020-03-16 ENCOUNTER — Ambulatory Visit: Payer: Medicare Other

## 2020-03-16 ENCOUNTER — Ambulatory Visit
Admission: RE | Admit: 2020-03-16 | Discharge: 2020-03-16 | Disposition: A | Discharge status: Home / Self Care | Payer: Medicare Other | Source: Ambulatory Visit | Attending: Radiation Oncology | Admitting: Radiation Oncology

## 2020-03-16 DIAGNOSIS — Z510 Encounter for antineoplastic radiation therapy: Secondary | ICD-10-CM | POA: Diagnosis not present

## 2020-03-17 ENCOUNTER — Ambulatory Visit
Admission: RE | Admit: 2020-03-17 | Discharge: 2020-03-17 | Disposition: A | Discharge status: Home / Self Care | Payer: Medicare Other | Source: Ambulatory Visit | Attending: Radiation Oncology | Admitting: Radiation Oncology

## 2020-03-17 ENCOUNTER — Ambulatory Visit: Payer: Medicare Other

## 2020-03-17 DIAGNOSIS — Z510 Encounter for antineoplastic radiation therapy: Secondary | ICD-10-CM | POA: Diagnosis not present

## 2020-03-20 ENCOUNTER — Ambulatory Visit: Payer: Medicare Other

## 2020-03-20 ENCOUNTER — Other Ambulatory Visit: Payer: Self-pay

## 2020-03-20 ENCOUNTER — Inpatient Hospital Stay (HOSPITAL_BASED_OUTPATIENT_CLINIC_OR_DEPARTMENT_OTHER): Payer: Medicare Other | Admitting: Oncology

## 2020-03-20 ENCOUNTER — Encounter: Payer: Self-pay | Admitting: Oncology

## 2020-03-20 ENCOUNTER — Ambulatory Visit
Admission: RE | Admit: 2020-03-20 | Discharge: 2020-03-20 | Disposition: A | Discharge status: Home / Self Care | Payer: Medicare Other | Source: Ambulatory Visit | Attending: Radiation Oncology | Admitting: Radiation Oncology

## 2020-03-20 ENCOUNTER — Inpatient Hospital Stay: Payer: Medicare Other

## 2020-03-20 ENCOUNTER — Telehealth: Payer: Self-pay | Admitting: Pharmacy Technician

## 2020-03-20 ENCOUNTER — Inpatient Hospital Stay: Payer: Medicare Other | Attending: Oncology

## 2020-03-20 VITALS — BP 160/83 | HR 56 | Temp 97.7°F | Resp 20 | Wt 212.7 lb

## 2020-03-20 DIAGNOSIS — C20 Malignant neoplasm of rectum: Secondary | ICD-10-CM | POA: Diagnosis present

## 2020-03-20 DIAGNOSIS — Z5111 Encounter for antineoplastic chemotherapy: Secondary | ICD-10-CM

## 2020-03-20 DIAGNOSIS — Z8616 Personal history of COVID-19: Secondary | ICD-10-CM | POA: Diagnosis not present

## 2020-03-20 DIAGNOSIS — N281 Cyst of kidney, acquired: Secondary | ICD-10-CM | POA: Diagnosis not present

## 2020-03-20 DIAGNOSIS — Z85828 Personal history of other malignant neoplasm of skin: Secondary | ICD-10-CM | POA: Insufficient documentation

## 2020-03-20 DIAGNOSIS — Z510 Encounter for antineoplastic radiation therapy: Secondary | ICD-10-CM | POA: Diagnosis not present

## 2020-03-20 DIAGNOSIS — Z95828 Presence of other vascular implants and grafts: Secondary | ICD-10-CM

## 2020-03-20 DIAGNOSIS — R918 Other nonspecific abnormal finding of lung field: Secondary | ICD-10-CM | POA: Diagnosis not present

## 2020-03-20 DIAGNOSIS — J9611 Chronic respiratory failure with hypoxia: Secondary | ICD-10-CM | POA: Diagnosis not present

## 2020-03-20 LAB — CBC WITH DIFFERENTIAL/PLATELET
Abs Immature Granulocytes: 0.13 10*3/uL — ABNORMAL HIGH (ref 0.00–0.07)
Basophils Absolute: 0.1 10*3/uL (ref 0.0–0.1)
Basophils Relative: 1 %
Eosinophils Absolute: 0.4 10*3/uL (ref 0.0–0.5)
Eosinophils Relative: 4 %
HCT: 41.7 % (ref 39.0–52.0)
Hemoglobin: 14.3 g/dL (ref 13.0–17.0)
Immature Granulocytes: 1 %
Lymphocytes Relative: 10 %
Lymphs Abs: 0.9 10*3/uL (ref 0.7–4.0)
MCH: 34.6 pg — ABNORMAL HIGH (ref 26.0–34.0)
MCHC: 34.3 g/dL (ref 30.0–36.0)
MCV: 101 fL — ABNORMAL HIGH (ref 80.0–100.0)
Monocytes Absolute: 0.8 10*3/uL (ref 0.1–1.0)
Monocytes Relative: 9 %
Neutro Abs: 7 10*3/uL (ref 1.7–7.7)
Neutrophils Relative %: 75 %
Platelets: 163 10*3/uL (ref 150–400)
RBC: 4.13 MIL/uL — ABNORMAL LOW (ref 4.22–5.81)
RDW: 12.9 % (ref 11.5–15.5)
WBC: 9.2 10*3/uL (ref 4.0–10.5)
nRBC: 0 % (ref 0.0–0.2)

## 2020-03-20 LAB — COMPREHENSIVE METABOLIC PANEL
ALT: 61 U/L — ABNORMAL HIGH (ref 0–44)
AST: 42 U/L — ABNORMAL HIGH (ref 15–41)
Albumin: 3.5 g/dL (ref 3.5–5.0)
Alkaline Phosphatase: 119 U/L (ref 38–126)
Anion gap: 7 (ref 5–15)
BUN: 11 mg/dL (ref 8–23)
CO2: 27 mmol/L (ref 22–32)
Calcium: 9 mg/dL (ref 8.9–10.3)
Chloride: 103 mmol/L (ref 98–111)
Creatinine, Ser: 0.84 mg/dL (ref 0.61–1.24)
GFR, Estimated: >60 mL/min (ref >60)
Glucose, Bld: 135 mg/dL — ABNORMAL HIGH (ref 70–99)
Potassium: 4.1 mmol/L (ref 3.5–5.1)
Sodium: 137 mmol/L (ref 135–145)
Total Bilirubin: 0.8 mg/dL (ref 0.3–1.2)
Total Protein: 7.3 g/dL (ref 6.5–8.1)

## 2020-03-20 MED ORDER — HEPARIN SOD (PORK) LOCK FLUSH 100 UNIT/ML IV SOLN
500.0000 [IU] | Freq: Once | INTRAVENOUS | Status: AC
  Administered 2020-03-20: 500 [IU] via INTRAVENOUS
  Filled 2020-03-20: qty 5

## 2020-03-20 MED ORDER — SODIUM CHLORIDE 0.9% FLUSH
10.0000 mL | Freq: Once | INTRAVENOUS | Status: AC
  Administered 2020-03-20: 10 mL via INTRAVENOUS
  Filled 2020-03-20: qty 10

## 2020-03-20 MED ORDER — HEPARIN SOD (PORK) LOCK FLUSH 100 UNIT/ML IV SOLN
INTRAVENOUS | Status: AC
  Filled 2020-03-20: qty 5

## 2020-03-20 NOTE — Progress Notes (Signed)
.  Hematology/Oncology follow up  note [facility] [phone] [phone]   Patient Care Team: Albina Billet, MD as PCP - General (Internal Medicine) Clent Jacks, RN as Oncology Nurse Navigator Earlie Server, MD as Consulting Physician (Oncology)  REFERRING PROVIDER: Albina Billet, MD  CHIEF COMPLAINTS/REASON FOR VISIT:  Follow-up for colorectal cancer  HISTORY OF PRESENTING ILLNESS:   Garrett Peterson is a  74 y.o.  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 Alice Reichert for follow-up on history of colon polyps.  Patient also has reported recent history of change of bowel habits.  He has felt more constipated. No blood in the stool, abdominal pain, unintentional weight loss or fever or chills. Colonoscopy records is at [facility] clinic and is not available to me. 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  MMR is deferred to resection specimen.  # 09/20/2019, patient underwent robotic assisted laparoscopic low anterior resection. Pathology report was reviewed by me and discussed with patient and his wife. I also called pathology and discussed with Dr. Reuel Derby. The tumor is located 1.5 cm distal to the rectosigmoid junction.  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  I explained to patient the different final diagnosis of cancer of rectum rather than endoscopic impression of distal sigmoid colon cancer.  Tumor was not visualized on preop CT scan.  -pT3 pN1 M0, grade 2 adenocarcinoma. MMR status is intact  All margins were uninvolved by invasive carcinoma, high-grade dysplasia/intramucosal adenocarcinoma and low-grade dysplastic.  1 out of 8 regional lymph nodes were positive for malignancy. Patient has lymphovascular  invasion as well as perineural invasion. I discussed with patient that ideally his rectal cancer would be better treated with total neoadjuvant treatment prior to the surgery if we would know his final diagnosis will be an upper rectal cancer.  Preoperative treatments is associated with a more favorable long-term toxicity profile and fever local recurrence then postoperative therapy.  However overall survival appears to be similar.In this case, I would recommend chemotherapy and adjuvant chemo- radiation  # 10/13/2019 patient had CT abdomen pelvis with contrast for evaluation of concern of peritonitis and bowel perforation.  Pneumoperitoneum was seen on earlier radiograph.  There was moderate pneumoperitoneum, extensive colonic diverticulosis.  5.2 x 4.1 cm loculated fluid collection in the left lower abdomen concerning for an infected collection or abscess. Patient had a drain placed and finished a course of antibiotics.  Draining catheter was pulled.  #11/02/2019, patient started adjuvant FOLFOX treatments.  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  Discussed with the patient and encourage him to further discuss with primary care provider for the need of see pulmonology  INTERVAL HISTORY Garrett Peterson is a 74 y.o. male who has above history reviewed by me today presents for follow up visit for management of rectal cancer. Problems and complaints are listed below: Patient has finished 7 cycles of adjuvant FOLFOX.  Cycle 8 treatment was held as patient was admitted due to pneumonia, respiratory failure due to his recent Covid 19 infection.  Patient was treated with steroid.  Patient was discharged home, and he continues to have shortness of breath with exertion.  Was started on home oxygen 4 to 5 L with activity. Patient has establish care with  Dr. Raul Del and was seen in the beginning of November. He is going to see Dr. Raul Del this week again. Patient  was accompanied by his stepson today. Apart from the breathing issue, he feels well, no nausea vomiting diarrhea.  Energy level has improved. Weight is stable.  Appetite is fair.  Review of Systems  Constitutional: Positive for fatigue. Negative for appetite change, chills, fever and unexpected weight change.  HENT:   Negative for hearing loss and voice change.   Eyes: Negative for eye problems and icterus.  Respiratory: Negative for chest tightness, cough and shortness of breath.   Cardiovascular: Negative for chest pain and leg swelling.  Gastrointestinal: Negative for abdominal distention and abdominal pain.  Endocrine: Negative for hot flashes.  Genitourinary: Negative for difficulty urinating, dysuria and frequency.   Musculoskeletal: Negative for arthralgias.  Skin: Negative for itching and rash.  Neurological: Negative for light-headedness and numbness.  Hematological: Negative for adenopathy. Does not bruise/bleed easily.  Psychiatric/Behavioral: Negative for confusion.    MEDICAL HISTORY:  Past Medical History:  Diagnosis Date  . Actinic keratosis   . Arthritis   . Basal cell carcinoma   . CAD (coronary artery disease)   . Cancer ([place]) 2021   rectal  . Hyperlipidemia   . Pre-diabetes     SURGICAL HISTORY: Past Surgical History:  Procedure Laterality Date  . APPENDECTOMY    . CARDIAC CATHETERIZATION    . COLONOSCOPY    . CORONARY ARTERY BYPASS GRAFT    . IR SINUS/FIST TUBE CHK-NON GI  10/27/2019  . open heart surgery    . PORTACATH PLACEMENT Right 10/13/2019   Procedure: INSERTION PORT-A-CATH;  Surgeon: Herbert Pun, MD;  Location: [facility] ORS;  Service: General;  Laterality: Right;    SOCIAL HISTORY: Social History   Socioeconomic History  . Marital status: Married    Spouse name: Not on file  . Number of children: Not on file  . Years of education: Not on file  . Highest education level: Not on file  Occupational History  . Not on file  Tobacco  Use  . Smoking status: Never Smoker  . Smokeless tobacco: Never Used  Vaping Use  . Vaping Use: Never used  Substance and Sexual Activity  . Alcohol use: Not Currently    Alcohol/week: 0.0 standard drinks  . Drug use: Never  . Sexual activity: Not Currently  Other Topics Concern  . Not on file  Social History Narrative  . Not on file   Social Determinants of Health   Financial Resource Strain:   . Difficulty of Paying Living Expenses: Not on file  Food Insecurity:   . Worried About Charity fundraiser in the Last Year: Not on file  . Ran Out of Food in the Last Year: Not on file  Transportation Needs:   . Lack of Transportation (Medical): Not on file  . Lack of Transportation (Non-Medical): Not on file  Physical Activity:   . Days of Exercise per Week: Not on file  . Minutes of Exercise per Session: Not on file  Stress:   . Feeling of Stress : Not on file  Social Connections:   . Frequency of Communication with Friends and Family: Not on file  . Frequency of Social Gatherings with Friends and Family: Not on file  . Attends Religious Services: Not on file  . Active Member of Clubs or Organizations: Not on file  . Attends Archivist Meetings: Not on file  . Marital Status:  Not on file  Intimate Partner Violence:   . Fear of Current or Ex-Partner: Not on file  . Emotionally Abused: Not on file  . Physically Abused: Not on file  . Sexually Abused: Not on file    FAMILY HISTORY: Family History  Problem Relation Age of Onset  . Lung disease Father     ALLERGIES:  has No Known Allergies.  MEDICATIONS:  Current Outpatient Medications  Medication Sig Dispense Refill  . albuterol (VENTOLIN HFA) 108 (90 Base) MCG/ACT inhaler Inhale 2 puffs into the lungs every 6 (six) hours as needed for wheezing or shortness of breath. 1 each 0  . dextromethorphan-guaiFENesin (MUCINEX DM) 30-600 MG 12hr tablet Take 1 tablet by mouth 2 (two) times daily as needed for cough. 60  tablet 0  . lidocaine-prilocaine (EMLA) cream Apply to affected area once 30 g 3  . metoprolol tartrate (LOPRESSOR) 25 MG tablet Take 25 mg by mouth 2 (two) times daily.    . predniSONE (DELTASONE) 20 MG tablet Take 20 mg by mouth daily.    . temazepam (RESTORIL) 30 MG capsule Take 30 mg by mouth at bedtime.     [name] Kitchen atorvastatin (LIPITOR) 20 MG tablet Take 20 mg by mouth every evening.  (Patient not taking: Reported on 03/20/2020)    . polyethylene glycol (MIRALAX) 17 g packet Take 17 g by mouth daily as needed for severe constipation. (Patient not taking: Reported on 03/03/2020) 20 each 1  . prochlorperazine (COMPAZINE) 10 MG tablet Take 1 tablet (10 mg total) by mouth every 6 (six) hours as needed (Nausea or vomiting). (Patient not taking: Reported on 03/03/2020) 30 tablet 1  . senna (SENOKOT) 8.6 MG TABS tablet Take 2 tablets (17.2 mg total) by mouth daily. (Patient not taking: Reported on 03/03/2020) 120 tablet 1  . sertraline (ZOLOFT) 25 MG tablet Take 1 tablet (25 mg total) by mouth daily. (Patient not taking: Reported on 03/03/2020) 30 tablet 0   No current facility-administered medications for this visit.     PHYSICAL EXAMINATION: ECOG PERFORMANCE STATUS: 1 - Symptomatic but completely ambulatory Vitals:   03/20/20 0907  BP: (!) 160/83  Pulse: (!) 56  Resp: 20  Temp: 97.7 F (36.5 C)  SpO2: 99%   Filed Weights   03/20/20 0907  Weight: 212 lb 11.2 oz (96.5 kg)    Physical Exam Constitutional:      General: He is not in acute distress. HENT:     Head: Normocephalic and atraumatic.  Eyes:     General: No scleral icterus. Cardiovascular:     Rate and Rhythm: Normal rate and regular rhythm.     Heart sounds: Normal heart sounds.  Pulmonary:     Effort: Pulmonary effort is normal. No respiratory distress.     Breath sounds: No wheezing.     Comments: Bibasilar crackles.  Mild wheezing Abdominal:     General: Bowel sounds are normal. There is no distension.      Palpations: Abdomen is soft.  Musculoskeletal:        General: No deformity. Normal range of motion.     Cervical back: Normal range of motion and neck supple.  Skin:    General: Skin is warm and dry.     Findings: No erythema or rash.     Comments: Right anterior chest wall + Mediport  Neurological:     Mental Status: He is alert and oriented to person, place, and time. Mental status is at baseline.  Cranial Nerves: No cranial nerve deficit.     Coordination: Coordination normal.  Psychiatric:        Mood and Affect: Mood normal.     LABORATORY DATA:  I have reviewed the data as listed Lab Results  Component Value Date   WBC 9.2 03/20/2020   HGB 14.3 03/20/2020   HCT 41.7 03/20/2020   MCV 101.0 (H) 03/20/2020   PLT 163 03/20/2020   Recent Labs    01/05/20 0809 01/05/20 0809 01/19/20 0815 01/19/20 0815 02/02/20 0817 02/02/20 0817 02/23/20 0813 02/23/20 0813 03/03/20 0930 03/04/20 0611 03/20/20 0845  NA 141   < > 137   < > 139   < > 138   < > 137 136 137  K 3.6   < > 3.7   < > 3.5   < > 4.1   < > 4.2 5.3* 4.1  CL 108   < > 104   < > 104   < > 105   < > 102 102 103  CO2 24   < > 24   < > 26   < > 25   < > _0 GLUCOSE 122*   < > 155*   < > 160*   < > 116*   < > 123* 136* 135*  BUN 9   < > 11   < > 10   < > 9   < > _1 CREATININE 0.88   < > 0.78   < > 0.94   < > 0.84   < > 0.89 0.94 0.84  CALCIUM 8.7*   < > 8.6*   < > 8.8*   < > 9.0   < > 9.5 9.4 9.0  GFRNONAA >60   < > >60   < > >60  --  >60   < > >60 >60 >60  GFRAA >60  --  >60  --  >60  --   --   --   --   --   --   PROT 7.1   < > 7.2   < > 7.0   < > 7.3  --  8.0  --  7.3  ALBUMIN 3.6   < > 3.5   < > 3.2*   < > 3.3*  --  3.7  --  3.5  AST 33   < > 31   < > 35   < > 38  --  44*  --  42*  ALT 23   < > 17   < > 24   < > 30  --  33  --  61*  ALKPHOS 155*   < > 222*   < > 261*   < > 193*  --  186*  --  119  BILITOT 0.6   < > 0.8   < > 0.6   < > 0.6  --  0.8  --  0.8   < > = values in this interval not  displayed.   Iron/TIBC/Ferritin/ %Sat    Component Value Date/Time   IRON 100 09/06/2019 1200   TIBC 368 09/06/2019 1200   FERRITIN 198 09/06/2019 1200   IRONPCTSAT 27 09/06/2019 1200      RADIOGRAPHIC STUDIES: I have personally reviewed the radiological images as listed and agreed with the findings in the report. DG Chest 2 View  Result Date: 03/03/2020 CLINICAL DATA:  Shortness of breath EXAM: CHEST - 2 VIEW  COMPARISON:  03/01/2020 FINDINGS: Right-sided chest port remains in place. Prior CABG. Stable heart size. Diffuse patchy airspace opacities most pronounced within the perihilar and bibasilar regions, not appreciably progressed compared to the previous study. Trace left pleural effusion. No pneumothorax. IMPRESSION: Persistent diffuse patchy airspace opacities, not appreciably progressed compared to the previous study. Electronically Signed   By: Davina Poke D.O.   On: 03/03/2020 09:43   DG Chest 2 View  Result Date: 03/01/2020 CLINICAL DATA:  Shortness of breath and cough. History of rectal carcinoma. Reported history of recent COVID 19 pneumonitis EXAM: CHEST - 2 VIEW COMPARISON:  February 22, 2020. FINDINGS: There is airspace opacity in the mid and lower lung regions with relative consolidation in portions of each mid and lower lung region. No new opacity. Port-A-Cath tip in superior vena cava. No pneumothorax. Heart is upper normal in size with pulmonary vascularity normal. Patient is status post coronary artery bypass grafting. No adenopathy. No bone lesions. IMPRESSION: Persistent multifocal airspace disease consistent with multifocal pneumonia. No new opacity evident. Note that a degree of impinging spread of tumor superimposed could present in this manner. Stable cardiac silhouette. No adenopathy evident. These results will be called to the ordering clinician or representative by the Radiologist Assistant, and communication documented in the PACS or [employer].  Electronically Signed   By: Lowella Grip III M.D.   On: 03/01/2020 09:37   DG Chest 2 View  Result Date: 02/23/2020 CLINICAL DATA:  Pneumonia, cough. EXAM: CHEST - 2 VIEW COMPARISON:  October 13, 2019. FINDINGS: Stable cardiomediastinal silhouette. Status post coronary bypass graft. No pneumothorax is noted. Interval development of multiple airspace opacities are seen in both lungs concerning for multifocal pneumonia due to COVID-19. No significant pleural effusion is noted. Right internal jugular Port-A-Cath is unchanged. Bony thorax is unremarkable. IMPRESSION: Interval development of multiple bilateral airspace opacities concerning for multifocal pneumonia due to COVID-19. Electronically Signed   By: Marijo Conception M.D.   On: 02/23/2020 15:54   CT CHEST ABDOMEN PELVIS W CONTRAST  Result Date: 03/02/2020 CLINICAL DATA:  History of rectal cancer. Multiple opacities on chest x-ray. EXAM: CT CHEST, ABDOMEN, AND PELVIS WITH CONTRAST TECHNIQUE: Multidetector CT imaging of the chest, abdomen and pelvis was performed following the standard protocol during bolus administration of intravenous contrast. CONTRAST:  149m OMNIPAQUE IOHEXOL 300 MG/ML  SOLN COMPARISON:  10/27/2019 FINDINGS: CT CHEST FINDINGS Cardiovascular: Prior CABG. Cardiomegaly. Diffuse aortic atherosclerosis. Mediastinum/Nodes: Borderline size mediastinal lymph nodes with a precarinal lymph node measuring 10 mm in short axis diameter. This is not significantly changed since prior chest CT. No axillary or hilar adenopathy. Lungs/Pleura: Extensive ground-glass airspace opacities throughout the lungs, worsening since prior studies. There appears to be bronchiectasis in the left lower lobe. No effusions. Airspace opacities are most prevalent in the lower lobes. Musculoskeletal: Chest wall soft tissues are unremarkable. No acute bony abnormality. CT ABDOMEN PELVIS FINDINGS Hepatobiliary: No focal liver abnormality is seen. Status post  cholecystectomy. No biliary dilatation. Pancreas: No focal abnormality or ductal dilatation. Spleen: No focal abnormality.  Normal size. Adrenals/Urinary Tract: Large cyst in the left kidney measuring 8.9 cm. No hydronephrosis. No visible stones or hydronephrosis. Adrenal glands and urinary bladder unremarkable. Stomach/Bowel: Postoperative changes in the rectosigmoid colon. Sigmoid diverticulosis. Large stool burden throughout the colon. Stomach and small bowel decompressed, unremarkable. Vascular/Lymphatic: Aortic atherosclerosis. No evidence of aneurysm or adenopathy. Reproductive: Prostate enlargement with central calcifications. Other: No free fluid or free air. Bilateral inguinal hernias containing fat. Musculoskeletal:  Degenerative changes in the lumbar spine. No acute bony abnormality. IMPRESSION: Extensive ground-glass airspace opacities throughout the lungs, worsening since prior study, most pronounced in the mid and lower lung zones. This is most concerning for infectious or inflammatory process. COVID pneumonia could have this appearance or inflammatory fibrosis following COVID pneumonia. Other inflammatory infectious processes are also possible. Trace left effusion. Aortic atherosclerosis. Large stool burden in the colon. Postoperative changes in the rectosigmoid colon without complicating feature. Sigmoid diverticulosis. Bilateral inguinal hernias containing fat. Prominent prostate with central calcifications. Electronically Signed   By: Rolm Baptise M.D.   On: 03/02/2020 18:40      ASSESSMENT & PLAN:  1. Rectal cancer ([place])   2. Chronic respiratory failure with hypoxia (HCC)   3. Encounter for antineoplastic chemotherapy   4. Port-A-Cath in place    #Rectal cancer stage III Patient has completed 7 cycles of adjuvant FOLFOX. Repeat CT abdomen pelvis showed no recurrence disease.  CEA has trended up, which can be secondary to lung infection. Hold FOLFOX treatment today. Patient has been  started on radiation treatments.  I discussed with rad Onc Dr. Baruch Gouty who recommends to continue radiation with no interruption at this point.  In that case, I will add low-dose Xeloda 1500 mg twice daily to be used with radiation.  I called patient and discussed with him and he is in agreement.  If he is not able to tolerate, will does reduce or discontinue.  We will look up his coverage of Xeloda I explained to the patient the risks and benefits of chemotherapy including all but not limited to, hair loss, hearing loss, mouth sore, nausea, vomiting, low blood counts, bleeding, heart failure, kidney failure, hand-foot syndrome and risk of life threatening infection and even death, secondary malignancy etc.   Patient voices understanding and willing to proceed chemotherapy.   # Chemotherapy education; port placement. Hopefully the planned start chemotherapy next week. Antiemetics-Zofran and Compazine; EMLA cream sent to pharmacy   #Chronic respiratory failure, hypoxia on home oxygen 4 to 5 L with activity. Patient follows up with Dr. Raul Del and will have a repeat x-ray. I encourage patient to further discuss with pulmonology about the prognosis, anticipated duration of steroid.    #Renal cyst, plan MRI abdomen with and without contrast for further evaluation of the renal cyst.  In the future #Weight loss, weight has been stable. #Port-A-Cath in place, continue port flush every 8 weeks.   Return of visit: 1 week, lab MD.  Earlie Server, MD, PhD Hematology Oncology [facility] at [facility] Pager- [phone] 03/20/2020

## 2020-03-20 NOTE — Progress Notes (Signed)
DISCONTINUE ON PATHWAY REGIMEN - Colorectal     A cycle is every 14 days:     Oxaliplatin      Leucovorin      Fluorouracil      Fluorouracil   **Always confirm dose/schedule in your pharmacy ordering system**  REASON: Continuation Of Treatment PRIOR TREATMENT: ROS56: mFOLFOX6 q14 Days x 4 Months TREATMENT RESPONSE: Stable Disease (SD)  START ON PATHWAY REGIMEN - Colorectal     Administer Monday through Friday:     Capecitabine   **Always confirm dose/schedule in your pharmacy ordering system**  Patient Characteristics: Postoperative without Neoadjuvant Therapy (Pathologic Staging), Rectal, pT4, pN0  or  Any pT, pN+ Tumor Location: Rectal Therapeutic Status: Postoperative without Neoadjuvant Therapy (Pathologic Staging) AJCC M Category: cM0 AJCC T Category: pT3 AJCC N Category: pN1 AJCC 8 Stage Grouping: IIIB Intent of Therapy: Curative Intent, Discussed with Patient

## 2020-03-20 NOTE — Progress Notes (Signed)
Patient has been diagnosed with COVID pneumonia and has a follow up appointment with pulmonologist tomorrow.

## 2020-03-20 NOTE — Telephone Encounter (Signed)
Oral Oncology Garrett Peterson Advocate Encounter  After completing a benefits investigation, prior authorization for Xeloda (Capecitabine) is not required at this time through [employer].  Garrett Peterson's copay is $0.00.  Garrett Peterson Garrett Peterson [place] Phone [phone] Fax [phone] 03/20/2020 3:30 PM

## 2020-03-20 NOTE — Progress Notes (Signed)
No treatment today. Port deaccessed. Patient stable at discharge

## 2020-03-21 ENCOUNTER — Telehealth: Payer: Self-pay | Admitting: Pharmacist

## 2020-03-21 ENCOUNTER — Other Ambulatory Visit: Payer: Self-pay

## 2020-03-21 ENCOUNTER — Ambulatory Visit: Payer: Medicare Other

## 2020-03-21 ENCOUNTER — Telehealth: Payer: Self-pay

## 2020-03-21 ENCOUNTER — Ambulatory Visit
Admission: RE | Admit: 2020-03-21 | Discharge: 2020-03-21 | Disposition: A | Discharge status: Home / Self Care | Payer: Medicare Other | Source: Ambulatory Visit | Attending: Radiation Oncology | Admitting: Radiation Oncology

## 2020-03-21 DIAGNOSIS — C20 Malignant neoplasm of rectum: Secondary | ICD-10-CM

## 2020-03-21 DIAGNOSIS — Z510 Encounter for antineoplastic radiation therapy: Secondary | ICD-10-CM | POA: Diagnosis not present

## 2020-03-21 LAB — CEA: CEA: 12.6 ng/mL — ABNORMAL HIGH (ref 0.0–4.7)

## 2020-03-21 MED ORDER — CAPECITABINE 500 MG PO TABS: 1500.0000 mg | ORAL_TABLET | Freq: Two times a day (BID) | ORAL | 0 refills | Status: DC

## 2020-03-21 NOTE — Telephone Encounter (Signed)
Encounter opened in error

## 2020-03-21 NOTE — Telephone Encounter (Signed)
Dr Tasia Catchings received call from Dr Baruch Gouty and they are going to delay start of Xeloda til after Thanksgiving.   [place] Patient Garrett Peterson Phone [phone] Fax [phone] 03/21/2020 12:56 PM

## 2020-03-21 NOTE — Addendum Note (Signed)
Addended by: Earlie Server on: 03/21/2020 11:46 AM   Modules accepted: Orders

## 2020-03-21 NOTE — Telephone Encounter (Signed)
Per [place] from Dr. Tasia Catchings : Team, please let him know that Dr.Fleming called me and there is a new area of consolidation on his recent CXR. Dr.Flemming plans to treatment him with 10 days course of antibiotics. After discussing with Dr.Chrystal. we both feel that his radiation will be held at this point and I will re-evaluate him after Thanksgiving,. if he is better and breathing status is stable, will plan to resume treatment at that point. please cancel appt next week, and adjust it to be lab md during the week of 11/29.   Pt notified that treatment will be held until after thanksigiving.

## 2020-03-22 ENCOUNTER — Ambulatory Visit: Payer: Medicare Other

## 2020-03-22 ENCOUNTER — Inpatient Hospital Stay: Payer: Medicare Other

## 2020-03-22 NOTE — Telephone Encounter (Signed)
Done  Pt will RTC on 11/29 per MD. Pt is aware of his scheduled 04/10/20 lab/MD appt

## 2020-03-23 ENCOUNTER — Ambulatory Visit: Payer: Medicare Other

## 2020-03-24 ENCOUNTER — Ambulatory Visit: Payer: Medicare Other

## 2020-03-27 ENCOUNTER — Ambulatory Visit: Payer: Medicare Other

## 2020-03-28 ENCOUNTER — Ambulatory Visit: Payer: Medicare Other | Admitting: Oncology

## 2020-03-28 ENCOUNTER — Ambulatory Visit: Payer: Medicare Other

## 2020-03-28 ENCOUNTER — Other Ambulatory Visit: Payer: Medicare Other

## 2020-03-29 ENCOUNTER — Ambulatory Visit: Payer: Medicare Other

## 2020-03-30 ENCOUNTER — Ambulatory Visit: Payer: Medicare Other

## 2020-03-31 ENCOUNTER — Ambulatory Visit: Payer: Medicare Other

## 2020-04-03 ENCOUNTER — Ambulatory Visit: Payer: Medicare Other

## 2020-04-03 ENCOUNTER — Inpatient Hospital Stay: Payer: Medicare Other

## 2020-04-03 NOTE — Progress Notes (Signed)
Nutrition  Called patient for nutrition follow-up.  Patient unable to speak with RD as going out to an appointment.    Jakyrah Holladay B. Zenia Resides, [place], [place] Registered Dietitian [phone] (mobile)

## 2020-04-04 ENCOUNTER — Ambulatory Visit: Payer: Medicare Other

## 2020-04-05 ENCOUNTER — Ambulatory Visit: Payer: Medicare Other

## 2020-04-09 ENCOUNTER — Ambulatory Visit: Payer: Medicare Other

## 2020-04-10 ENCOUNTER — Ambulatory Visit: Payer: Medicare Other

## 2020-04-10 ENCOUNTER — Telehealth: Payer: Self-pay | Admitting: Licensed Clinical Social Worker

## 2020-04-10 ENCOUNTER — Inpatient Hospital Stay: Payer: Medicare Other

## 2020-04-10 ENCOUNTER — Inpatient Hospital Stay: Payer: Medicare Other | Admitting: Oncology

## 2020-04-10 NOTE — Telephone Encounter (Signed)
Patient called this morning to cancel his radiation tx for today and possibly for the rest of the week.I see he canceled his appt with Dr. Tasia Catchings also. He said it may be awhile before he can get treatment and that he is will be seeing a specialist at [facility] about his lung issues.

## 2020-04-11 ENCOUNTER — Ambulatory Visit: Payer: Medicare Other

## 2020-04-11 ENCOUNTER — Ambulatory Visit: Admission: RE | Admit: 2020-04-11 | Payer: Medicare Other | Source: Ambulatory Visit

## 2020-04-12 ENCOUNTER — Ambulatory Visit: Payer: Medicare Other

## 2020-04-12 ENCOUNTER — Telehealth: Payer: Self-pay

## 2020-04-12 NOTE — Telephone Encounter (Signed)
[place] message received from [place], [place] in radiation:  Dr. Baruch Gouty wanted to check in on the plan with Mr. Gelpi.   The patient called the other day and said they were referring him to [facility] and it will be a long time before he will resume radiation.   Should we just take him off the schedule and wait to hear back from you when he will restart chemo/rads  Dr. Tasia Catchings response:  he is battling with COVID lung complication.  I wonder if that is the reason for him to go to [facility].

## 2020-04-12 NOTE — Telephone Encounter (Signed)
Called patient to discuss his plans.  Patient plans to continue oncology care at [facility].  He would like to "figure out whats going on with his lungs" before pursuing further treatment.  Sates he called [facility] pulmonology today requesting to go to [facility] for bx if they can't perform at [facility] location.

## 2020-04-12 NOTE — Telephone Encounter (Signed)
FYI

## 2020-04-13 ENCOUNTER — Ambulatory Visit: Payer: Medicare Other

## 2020-04-14 ENCOUNTER — Other Ambulatory Visit: Payer: Medicare Other

## 2020-04-14 ENCOUNTER — Ambulatory Visit: Payer: Medicare Other | Admitting: Oncology

## 2020-04-14 ENCOUNTER — Ambulatory Visit: Payer: Medicare Other

## 2020-04-17 ENCOUNTER — Ambulatory Visit: Payer: Medicare Other

## 2020-04-18 ENCOUNTER — Ambulatory Visit: Payer: Medicare Other

## 2020-04-19 ENCOUNTER — Ambulatory Visit: Payer: Medicare Other

## 2020-04-19 ENCOUNTER — Encounter
Admission: RE | Admit: 2020-04-19 | Discharge: 2020-04-19 | Disposition: A | Discharge status: Home / Self Care | Payer: Medicare Other | Source: Ambulatory Visit | Attending: Pulmonary Disease | Admitting: Pulmonary Disease

## 2020-04-19 ENCOUNTER — Other Ambulatory Visit: Payer: Self-pay

## 2020-04-19 HISTORY — DX: Respiratory failure, unspecified with hypoxia: J96.91

## 2020-04-19 HISTORY — DX: Pneumonia, unspecified organism: J18.9

## 2020-04-19 HISTORY — DX: Benign neoplasm of colon, unspecified: D12.6

## 2020-04-19 NOTE — Patient Instructions (Signed)
Your procedure is scheduled on: Monday December 13. 2021 Report to the Registration Desk on the 1st floor of the [facility] AT 11:30 AM   REMEMBER: Instructions that are not followed completely may result in serious medical risk, up to and including death; or upon the discretion of your surgeon and anesthesiologist your surgery may need to be rescheduled.  Do not eat food OR DRINK after midnight the night before surgery.  No gum chewing, lozengers or hard candies.  TAKE THESE MEDICATIONS THE MORNING OF SURGERY WITH A SIP OF WATER: METOPROLOL PREDNISONE   Follow recommendations from Cardiologist, Pulmonologist or PCP regarding stopping ASPIRIN OR Aspirin, Coumadin, Plavix, Eliquis, Pradaxa, or Pletal.  One week prior to surgery: Stop Anti-inflammatories (NSAIDS) such as Advil, Aleve, Ibuprofen, Motrin, Naproxen, Naprosyn and Aspirin based products such as Excedrin, Goodys Powder, BC Powder. Stop ANY OVER THE COUNTER supplements until after surgery. (However, you may continue taking Vitamin D, Vitamin B, and multivitamin up until the day before surgery.)  No Alcohol for 24 hours before or after surgery.  No Smoking including e-cigarettes for 24 hours prior to surgery.  No chewable tobacco products for at least 6 hours prior to surgery.  No nicotine patches on the day of surgery.  Do not use any "recreational" drugs for at least a week prior to your surgery.  Please be advised that the combination of cocaine and anesthesia may have negative outcomes, up to and including death. If you test positive for cocaine, your surgery will be cancelled.  On the morning of surgery brush your teeth with toothpaste and water, you may rinse your mouth with mouthwash if you wish. Do not swallow any toothpaste or mouthwash.  Do not wear jewelry, make-up, hairpins, clips or nail polish.  Do not wear lotions, powders, or perfumes.   Do not shave body from the neck down 48 hours prior to surgery  just in case you cut yourself which could leave a site for infection.  Also, freshly shaved skin may become irritated if using the CHG soap.  Contact lenses, hearing aids and dentures may not be worn into surgery.  Do not bring valuables to the hospital. [facility] is not responsible for any missing/lost belongings or valuables.   SHOWER MORNING OF SURGERY  Notify your doctor if there is any change in your medical condition (cold, fever, infection).  Wear comfortable clothing (specific to your surgery type) to the hospital.  Plan for stool softeners for home use; pain medications have a tendency to cause constipation. You can also help prevent constipation by eating foods high in fiber such as fruits and vegetables and drinking plenty of fluids as your diet allows.  After surgery, you can help prevent lung complications by doing breathing exercises.  Take deep breaths and cough every 1-2 hours. Your doctor may order a device called an Incentive Spirometer to help you take deep breaths. When coughing or sneezing, hold a pillow firmly against your incision with both hands. This is called "splinting." Doing this helps protect your incision. It also decreases belly discomfort.  If you are being discharged the day of surgery, you will not be allowed to drive home. You will need a responsible adult (18 years or older) to drive you home and stay with you that night.   If you are taking public transportation, you will need to have a responsible adult (18 years or older) with you. Please confirm with your physician that it is acceptable to use public transportation.  Please call the [place] [facility]. at [phone] if you have any questions about these instructions.  Visitation Policy:  Patients undergoing a surgery or procedure may have one family member or support person with them as long as that person is not COVID-19 positive or experiencing its symptoms.  That person may  remain in the waiting area during the procedure.  Inpatient Visitation Update:   In an effort to ensure the safety of our team members and our patients, we are implementing a change to our visitation policy:  Effective Monday, Aug. 9, at 7 a.m., inpatients will be allowed one support person.  o The support person may change daily.  o The support person must pass our screening, gel in and out, and wear a mask at all times, including in the patient's room.  o Patients must also wear a mask when staff or their support person are in the room.  o Masking is required regardless of vaccination status.  Systemwide, no visitors 17 or younger.

## 2020-04-20 ENCOUNTER — Encounter (HOSPITAL_COMMUNITY): Payer: Self-pay | Admitting: Urgent Care

## 2020-04-20 ENCOUNTER — Encounter
Admission: RE | Admit: 2020-04-20 | Discharge: 2020-04-20 | Disposition: A | Discharge status: Home / Self Care | Payer: Medicare Other | Source: Ambulatory Visit | Attending: Pulmonary Disease | Admitting: Pulmonary Disease

## 2020-04-20 ENCOUNTER — Ambulatory Visit: Payer: Medicare Other

## 2020-04-20 DIAGNOSIS — Z20822 Contact with and (suspected) exposure to covid-19: Secondary | ICD-10-CM | POA: Diagnosis not present

## 2020-04-20 DIAGNOSIS — Z01812 Encounter for preprocedural laboratory examination: Secondary | ICD-10-CM | POA: Diagnosis not present

## 2020-04-20 LAB — CBC
HCT: 44.8 % (ref 39.0–52.0)
Hemoglobin: 15.3 g/dL (ref 13.0–17.0)
MCH: 34.1 pg — ABNORMAL HIGH (ref 26.0–34.0)
MCHC: 34.2 g/dL (ref 30.0–36.0)
MCV: 99.8 fL (ref 80.0–100.0)
Platelets: 171 10*3/uL (ref 150–400)
RBC: 4.49 MIL/uL (ref 4.22–5.81)
RDW: 12.9 % (ref 11.5–15.5)
WBC: 14.6 10*3/uL — ABNORMAL HIGH (ref 4.0–10.5)
nRBC: 0 % (ref 0.0–0.2)

## 2020-04-20 LAB — PROTIME-INR
INR: 1 (ref 0.8–1.2)
Prothrombin Time: 13.1 seconds (ref 11.4–15.2)

## 2020-04-20 LAB — APTT: aPTT: 27 seconds (ref 24–36)

## 2020-04-21 ENCOUNTER — Ambulatory Visit: Payer: Medicare Other

## 2020-04-21 LAB — SARS CORONAVIRUS 2 (TAT 6-24 HRS): SARS Coronavirus 2: NEGATIVE

## 2020-04-24 ENCOUNTER — Encounter
Admission: RE | Disposition: A | Discharge status: Home / Self Care | Payer: Self-pay | Source: Ambulatory Visit | Attending: Pulmonary Disease

## 2020-04-24 ENCOUNTER — Ambulatory Visit
Admission: RE | Admit: 2020-04-24 | Discharge: 2020-04-24 | Disposition: A | Discharge status: Home / Self Care | Payer: Medicare Other | Source: Ambulatory Visit | Attending: Pulmonary Disease | Admitting: Pulmonary Disease

## 2020-04-24 ENCOUNTER — Ambulatory Visit: Payer: Medicare Other | Admitting: Anesthesiology

## 2020-04-24 ENCOUNTER — Ambulatory Visit: Payer: Medicare Other

## 2020-04-24 ENCOUNTER — Other Ambulatory Visit: Payer: Self-pay

## 2020-04-24 DIAGNOSIS — J479 Bronchiectasis, uncomplicated: Secondary | ICD-10-CM | POA: Diagnosis present

## 2020-04-24 DIAGNOSIS — Z85048 Personal history of other malignant neoplasm of rectum, rectosigmoid junction, and anus: Secondary | ICD-10-CM | POA: Diagnosis not present

## 2020-04-24 DIAGNOSIS — Z951 Presence of aortocoronary bypass graft: Secondary | ICD-10-CM | POA: Insufficient documentation

## 2020-04-24 DIAGNOSIS — Z9049 Acquired absence of other specified parts of digestive tract: Secondary | ICD-10-CM | POA: Insufficient documentation

## 2020-04-24 HISTORY — PX: FLEXIBLE BRONCHOSCOPY: SHX5094

## 2020-04-24 SURGERY — BRONCHOSCOPY, WITH EBUS
Anesthesia: General

## 2020-04-24 SURGERY — BRONCHOSCOPY, FLEXIBLE
Anesthesia: General

## 2020-04-24 MED ORDER — PHENYLEPHRINE HCL 0.25 % NA SOLN
1.0000 | Freq: Four times a day (QID) | NASAL | Status: DC | PRN
  Filled 2020-04-24: qty 15

## 2020-04-24 MED ORDER — OXYCODONE HCL 5 MG PO TABS: 5.0000 mg | ORAL_TABLET | Freq: Once | ORAL | Status: DC | PRN

## 2020-04-24 MED ORDER — ONDANSETRON HCL 4 MG/2ML IJ SOLN
INTRAMUSCULAR | Status: AC
  Filled 2020-04-24: qty 2

## 2020-04-24 MED ORDER — LACTATED RINGERS IV SOLN: INTRAVENOUS | Status: DC

## 2020-04-24 MED ORDER — CHLORHEXIDINE GLUCONATE 0.12 % MT SOLN
OROMUCOSAL | Status: AC
  Administered 2020-04-24: 15 mL via OROMUCOSAL
  Filled 2020-04-24: qty 15

## 2020-04-24 MED ORDER — ROCURONIUM BROMIDE 10 MG/ML (PF) SYRINGE
PREFILLED_SYRINGE | INTRAVENOUS | Status: AC
  Filled 2020-04-24: qty 10

## 2020-04-24 MED ORDER — PHENYLEPHRINE HCL (PRESSORS) 10 MG/ML IV SOLN
INTRAVENOUS | Status: DC | PRN
  Administered 2020-04-24 (×2): 100 ug via INTRAVENOUS

## 2020-04-24 MED ORDER — FAMOTIDINE 20 MG PO TABS: 20.0000 mg | ORAL_TABLET | Freq: Once | ORAL | Status: AC

## 2020-04-24 MED ORDER — FENTANYL CITRATE (PF) 100 MCG/2ML IJ SOLN
INTRAMUSCULAR | Status: DC | PRN
  Administered 2020-04-24: 50 ug via INTRAVENOUS

## 2020-04-24 MED ORDER — FAMOTIDINE 20 MG PO TABS
ORAL_TABLET | ORAL | Status: AC
  Administered 2020-04-24: 20 mg via ORAL
  Filled 2020-04-24: qty 1

## 2020-04-24 MED ORDER — DEXAMETHASONE SODIUM PHOSPHATE 10 MG/ML IJ SOLN
INTRAMUSCULAR | Status: AC
  Filled 2020-04-24: qty 1

## 2020-04-24 MED ORDER — LIDOCAINE HCL URETHRAL/MUCOSAL 2 % EX GEL
1.0000 "application " | Freq: Once | CUTANEOUS | Status: DC
  Filled 2020-04-24: qty 5

## 2020-04-24 MED ORDER — LIDOCAINE HCL (PF) 2 % IJ SOLN
INTRAMUSCULAR | Status: AC
  Filled 2020-04-24: qty 5

## 2020-04-24 MED ORDER — SUGAMMADEX SODIUM 200 MG/2ML IV SOLN
INTRAVENOUS | Status: DC | PRN
  Administered 2020-04-24: 300 mg via INTRAVENOUS

## 2020-04-24 MED ORDER — LIDOCAINE HCL (PF) 1 % IJ SOLN
30.0000 mL | Freq: Once | INTRAMUSCULAR | Status: DC
  Filled 2020-04-24: qty 30

## 2020-04-24 MED ORDER — ROCURONIUM BROMIDE 100 MG/10ML IV SOLN
INTRAVENOUS | Status: DC | PRN
  Administered 2020-04-24: 50 mg via INTRAVENOUS

## 2020-04-24 MED ORDER — OXYCODONE HCL 5 MG/5ML PO SOLN: 5.0000 mg | Freq: Once | ORAL | Status: DC | PRN

## 2020-04-24 MED ORDER — GLYCOPYRROLATE 0.2 MG/ML IJ SOLN
INTRAMUSCULAR | Status: DC | PRN
  Administered 2020-04-24: .2 mg via INTRAVENOUS

## 2020-04-24 MED ORDER — ORAL CARE MOUTH RINSE: 15.0000 mL | Freq: Once | OROMUCOSAL | Status: AC

## 2020-04-24 MED ORDER — LIDOCAINE HCL (CARDIAC) PF 100 MG/5ML IV SOSY
PREFILLED_SYRINGE | INTRAVENOUS | Status: DC | PRN
  Administered 2020-04-24: 100 mg via INTRAVENOUS

## 2020-04-24 MED ORDER — FENTANYL CITRATE (PF) 100 MCG/2ML IJ SOLN
INTRAMUSCULAR | Status: AC
  Filled 2020-04-24: qty 2

## 2020-04-24 MED ORDER — FENTANYL CITRATE (PF) 100 MCG/2ML IJ SOLN: 25.0000 ug | INTRAMUSCULAR | Status: DC | PRN

## 2020-04-24 MED ORDER — ONDANSETRON HCL 4 MG/2ML IJ SOLN
INTRAMUSCULAR | Status: DC | PRN
  Administered 2020-04-24: 4 mg via INTRAVENOUS

## 2020-04-24 MED ORDER — DEXAMETHASONE SODIUM PHOSPHATE 10 MG/ML IJ SOLN
INTRAMUSCULAR | Status: DC | PRN
  Administered 2020-04-24: 10 mg via INTRAVENOUS

## 2020-04-24 MED ORDER — CHLORHEXIDINE GLUCONATE 0.12 % MT SOLN: 15.0000 mL | Freq: Once | OROMUCOSAL | Status: AC

## 2020-04-24 MED ORDER — BUTAMBEN-TETRACAINE-BENZOCAINE 2-2-14 % EX AERO
1.0000 | INHALATION_SPRAY | Freq: Once | CUTANEOUS | Status: DC
  Filled 2020-04-24: qty 20

## 2020-04-24 MED ORDER — PROPOFOL 10 MG/ML IV BOLUS
INTRAVENOUS | Status: DC | PRN
  Administered 2020-04-24: 130 mg via INTRAVENOUS

## 2020-04-24 NOTE — Procedures (Signed)
FLEXIBLE BRONCHOSCOPY PROCEDURE NOTE    Flexible bronchoscopy was performed on 13/13/21  by :Lanney Gins   assistance by : Maebelle Munroe RT and 2)[facility] staff 3) Anesthesia   Indication for the procedure was :  Pre-procedural H&P. The following assessment was performed on the day of the procedure prior to initiating sedation History:  Chest pain n Dyspnea n Hemoptysis n Cough n Fever n Other pertinent items y  Examination Vital signs -reviewed as per nursing documentation today Cardiac    Murmurs: n  Rubs : n  Gallop: n Lungs Wheezing: y Rales : y Rhonchi :y  Other pertinent findings: n   Pre-procedural assessment for Procedural Sedation included: Depth of sedation: As per anesthesia team  ASA Classification:  2 Mallampati airway assessment: 2    Medication list reviewed: yes  The patient's interval history was taken and revealed: no new complaints The pre- procedure physical examination revealed: No new findings Refer to prior clinic note for details.  Informed Consent: Informed consent was obtained from:  patient after explanation of procedure and risks, benefits, as well as alternative procedures available.  Explanation of level of sedation and possible transfusion was also provided.    Procedural Preparation: Time out was performed and patient was identified by name and birthdate and procedure to be performed and side for sampling, if any, was specified. Pt was intubated by anesthesia.  The patient was appropriately draped.  Procedure Findings: Bronchoscope was inserted via ETT  without difficulty.  Posterior oropharynx, epiglottis, arytenoids, false cords and vocal cords were not visualized as these were bypassed by endotracheal tube.   The distal trachea was normal in circumference and appearance without mucosal, cartilaginous or branching abnormalities.  The main carina was mildly splayed  .   All right and left lobar airways were visualized to the  Sub-segmental level.  Sub- sub segmental carinae were identified in all the distal airways.   Secretions were visible in the following airways and appeared to be normal.  The mucosa was : normal   Airways were notable for:        exophytic lesions :none        extrinsic compression in the following distributions: none .       Friable mucosa: n       Anthrocotic material /pigmentation: n   Pictorial documentation attached: n      Specimens obtained included:   Broncho-alveolar lavage site:BAL   sent for microbiology and cytology                              90 ml volume infused 75 ml volume returned with cellular appearance   Immediate sampling complications included:none  Epinephrine none ml was used topically  The bronchoscopy was terminated due to completion of the planned procedure and the bronchoscope was removed.   Total dosage of Lidocaine was none mg Total fluoroscopy time was none  minutes  Supplemental oxygen was provided at none  lpm by nasal canula post operatively    Estimated Blood loss: 0cc.  Complications included:  none   Preliminary CXR findings :  None   Disposition: home with grandson  Follow up with Dr. Lanney Gins  in 5 days for result discussion.   Claudette Stapler MD  [place] Division of Pulmonary & Critical Care Medicine

## 2020-04-24 NOTE — Transfer of Care (Signed)
Immediate Anesthesia Transfer of Care Note  Patient: Garrett Peterson  Procedure(s) Performed: FLEXIBLE BRONCHOSCOPY (N/A )  Patient Location: PACU  Anesthesia Type:General  Level of Consciousness: drowsy  Airway & Oxygen Therapy: Patient Spontanous Breathing and Patient connected to face mask oxygen  Post-op Assessment: Report given to RN  Post vital signs: stable  Last Vitals:  Vitals Value Taken Time  BP 138/76 04/24/20 1323  Temp    Pulse 57 04/24/20 1324  Resp 17 04/24/20 1324  SpO2 99 % 04/24/20 1324  Vitals shown include unvalidated device data.  Last Pain:  Vitals:   04/24/20 1145  TempSrc: Temporal  PainSc: 0-No pain         Complications: No complications documented.

## 2020-04-24 NOTE — OR Nursing (Signed)
Lab notfied me that the CMV DNA ultra Quantitative lab was not done.  It needed to be drawn in a Lav. Top frozen tube.  Patient had already left the building.  Dr. Lanney Gins notified per secure chat.

## 2020-04-24 NOTE — Discharge Instructions (Signed)
AMBULATORY SURGERY  DISCHARGE INSTRUCTIONS   1) The drugs that you were given will stay in your system until tomorrow so for the next 24 hours you should not:  A) Drive an automobile B) Make any legal decisions C) Drink any alcoholic beverage   2) You may resume regular meals tomorrow.  Today it is better to start with liquids and gradually work up to solid foods.  You may eat anything you prefer, but it is better to start with liquids, then soup and crackers, and gradually work up to solid foods.   3) Please notify your doctor immediately if you have any unusual bleeding, trouble breathing, redness and pain at the surgery site, drainage, fever, or pain not relieved by medication.    4) Additional Instructions:  Coughing is normal.  Slight blood tinged sputum is not usual.  May eat and drink in 2 hours  Please contact your physician with any problems or Same Day Surgery at [phone], Monday through Friday 6 am to 4 pm, or [place] at [facility] number at [phone].

## 2020-04-24 NOTE — Anesthesia Preprocedure Evaluation (Signed)
Anesthesia Evaluation  Patient identified by MRN, date of birth, ID band Patient awake    Reviewed: Allergy & Precautions, H&P , NPO status , Patient's Chart, lab work & pertinent test results, reviewed documented beta blocker date and time   History of Anesthesia Complications Negative for: history of anesthetic complications  Airway Mallampati: III  TM Distance: >3 FB Neck ROM: limited    Dental  (+) Poor Dentition, Chipped, Missing   Pulmonary shortness of breath and with exertion, pneumonia, COPD,  COPD inhaler and oxygen dependent,    + rhonchi  + decreased breath sounds      Cardiovascular hypertension, + CAD  negative cardio ROS Normal cardiovascular exam     Neuro/Psych negative neurological ROS  negative psych ROS   GI/Hepatic negative GI ROS, Neg liver ROS, neg GERD  ,  Endo/Other  negative endocrine ROS  Renal/GU negative Renal ROS  negative genitourinary   Musculoskeletal  (+) Arthritis ,   Abdominal   Peds  Hematology negative hematology ROS (+)   Anesthesia Other Findings Past Medical History: No date: Arthritis No date: Basal cell carcinoma No date: CAD (coronary artery disease) No date: Hyperlipidemia No date: Pre-diabetes Past Surgical History: No date: APPENDECTOMY No date: CARDIAC CATHETERIZATION No date: COLONOSCOPY No date: CORONARY ARTERY BYPASS GRAFT No date: open heart surgery BMI    Body Mass Index: 34.06 kg/m     Reproductive/Obstetrics negative OB ROS                             Anesthesia Physical  Anesthesia Plan  ASA: III  Anesthesia Plan: General ETT   Post-op Pain Management:    Induction: Intravenous  PONV Risk Score and Plan: 3 and Ondansetron, Dexamethasone, Midazolam and Treatment may vary due to age or medical condition  Airway Management Planned: Oral ETT  Additional Equipment:   Intra-op Plan:   Post-operative Plan:  Extubation in OR and Possible Post-op intubation/ventilation  Informed Consent: I have reviewed the patients History and Physical, chart, labs and discussed the procedure including the risks, benefits and alternatives for the proposed anesthesia with the patient or authorized representative who has indicated his/her understanding and acceptance.     Dental Advisory Given  Plan Discussed with: CRNA  Anesthesia Plan Comments: (Patient and grandson consented for risks of anesthesia including but not limited to:  - adverse reactions to medications - damage to eyes, teeth, lips or other oral mucosa - nerve damage due to positioning  - sore throat or hoarseness - Damage to heart, brain, nerves, lungs, other parts of body or loss of life  They voiced understanding.)        Anesthesia Quick Evaluation

## 2020-04-24 NOTE — Anesthesia Procedure Notes (Signed)
Procedure Name: Intubation Date/Time: 04/24/2020 12:58 PM Performed by: Lerry Liner, CRNA Pre-anesthesia Checklist: Patient identified, Emergency Drugs available, Suction available and Patient being monitored Patient Re-evaluated:Patient Re-evaluated prior to induction Oxygen Delivery Method: Circle system utilized Preoxygenation: Pre-oxygenation with 100% oxygen Induction Type: IV induction Ventilation: Mask ventilation without difficulty Laryngoscope Size: McGraph and 4 Grade View: Grade I Tube type: Oral Tube size: 8.0 mm Number of attempts: 1 Airway Equipment and Method: Stylet and Oral airway Placement Confirmation: ETT inserted through vocal cords under direct vision,  positive ETCO2 and breath sounds checked- equal and bilateral Secured at: 23 cm Tube secured with: Tape Dental Injury: Teeth and Oropharynx as per pre-operative assessment

## 2020-04-24 NOTE — H&P (Signed)
Pulmonary Medicine          Date: 04/24/2020,   MRN# [id number] ATA PECHA 1945/05/30     AdmissionWeight: 97.1 kg                 CurrentWeight: 97.1 kg  Referring physician: Dr. Raul Del    CHIEF COMPLAINT:   Current persistent cough with bronchiectasis   HISTORY OF PRESENT ILLNESS   This is a pleasant 74 year old male with a history of rectal cancer, CAD, basal cell carcinoma adenomatous polyps of the colon, actinic keratosis, diabetes, history of pneumonia who came in to [facility] outpatient pulmonary clinic with complaints of chronic cough for last 3 months as well as dyspnea with minimal exertion.  He is accompanied by his grandson today who shares that patient has overall been in his chronic stable state without any dyspnea in the past and has over the last 2 months is progressively been getting worse.  He met with his primary pulmonologist and had discussed doing bronchoscopic evaluation with BAL for atypical infection.  Today we discussed procedure patient is agreeable and wishes to proceed.  Questions were answered risks and benefits were reviewed.    PAST MEDICAL HISTORY   Past Medical History:  Diagnosis Date  . Actinic keratosis   . Adenomatous polyp of colon   . Arthritis   . Basal cell carcinoma   . CAD (coronary artery disease)   . Cancer ([place]) 2021   rectal  . Hyperlipidemia   . Pneumonia   . Pre-diabetes   . Respiratory failure with hypoxia [facility])      SURGICAL HISTORY   Past Surgical History:  Procedure Laterality Date  . APPENDECTOMY    . CARDIAC CATHETERIZATION     PT DENIES  . COLONOSCOPY     2011,2015,2021  . CORONARY ARTERY BYPASS GRAFT    . IR SINUS/FIST TUBE CHK-NON GI  10/27/2019  . open heart surgery    . PORTACATH PLACEMENT Right 10/13/2019   Procedure: INSERTION PORT-A-CATH;  Surgeon: Herbert Pun, MD;  Location: [facility] ORS;  Service: General;  Laterality: Right;  . ROBOT ASSISTED LAPAROSCOPIC PARTIAL COLECTOMY   09/2019     FAMILY HISTORY   Family History  Problem Relation Age of Onset  . Lung disease Father      SOCIAL HISTORY   Social History   Tobacco Use  . Smoking status: Never Smoker  . Smokeless tobacco: Former Systems developer    Types: Secondary school teacher  . Vaping Use: Never used  Substance Use Topics  . Alcohol use: Yes    Alcohol/week: 0.0 standard drinks    Comment: OCCAS  . Drug use: Never     MEDICATIONS    Home Medication:    Current Medication:  Current Facility-Administered Medications:  .  butamben-tetracaine-benzocaine (CETACAINE) spray 1 spray, 1 spray, Topical, Once, Stevi Hollinshead, MD .  lactated ringers infusion, , Intravenous, Continuous, Alvin Critchley, MD .  lidocaine (PF) (XYLOCAINE) 1 % injection 30 mL, 30 mL, Other, Once, Yola Paradiso, MD .  lidocaine (XYLOCAINE) 2 % jelly 1 application, 1 application, Topical, Once, Janeisha Ryle, MD .  phenylephrine (NEO-SYNEPHRINE) 0.25 % nasal spray 1 spray, 1 spray, Each Nare, Q6H PRN, Ottie Glazier, MD    ALLERGIES   Patient has no known allergies.     REVIEW OF SYSTEMS    Review of Systems:  Gen:  Denies  fever, sweats, chills weigh loss  HEENT: Denies blurred vision, double vision, ear pain,  eye pain, hearing loss, nose bleeds, sore throat Cardiac:  No dizziness, chest pain or heaviness, chest tightness,edema Resp:   Denies cough or sputum porduction, shortness of breath,wheezing, hemoptysis,  Gi: Denies swallowing difficulty, stomach pain, nausea or vomiting, diarrhea, constipation, bowel incontinence Gu:  Denies bladder incontinence, burning urine Ext:   Denies Joint pain, stiffness or swelling Skin: Denies  skin rash, easy bruising or bleeding or hives Endoc:  Denies polyuria, polydipsia , polyphagia or weight change Psych:   Denies depression, insomnia or hallucinations   Other:  All other systems negative   VS: BP (!) 142/81   Pulse 70   Temp (!) 97 F (36.1 C) (Temporal)   Resp 16    Ht _0  (1.753 m)   Wt 97.1 kg   SpO2 96%   BMI 31.61 kg/m      PHYSICAL EXAM    GENERAL:NAD, no fevers, chills, no weakness no fatigue HEAD: Normocephalic, atraumatic.  EYES: Pupils equal, round, reactive to light. Extraocular muscles intact. No scleral icterus.  MOUTH: Moist mucosal membrane. Dentition intact. No abscess noted.  EAR, NOSE, THROAT: Clear without exudates. No external lesions.  NECK: Supple. No thyromegaly. No nodules. No JVD.  PULMONARY: Diffuse coarse rhonchi right sided +wheezes CARDIOVASCULAR: S1 and S2. Regular rate and rhythm. No murmurs, rubs, or gallops. No edema. Pedal pulses 2+ bilaterally.  GASTROINTESTINAL: Soft, nontender, nondistended. No masses. Positive bowel sounds. No hepatosplenomegaly.  MUSCULOSKELETAL: No swelling, clubbing, or edema. Range of motion full in all extremities.  NEUROLOGIC: Cranial nerves II through XII are intact. No gross focal neurological deficits. Sensation intact. Reflexes intact.  SKIN: No ulceration, lesions, rashes, or cyanosis. Skin warm and dry. Turgor intact.  PSYCHIATRIC: Mood, affect within normal limits. The patient is awake, alert and oriented x 3. Insight, judgment intact.       IMAGING    No results found.   Cardiovascular: Prior CABG. Cardiomegaly. Diffuse aortic atherosclerosis.  Mediastinum/Nodes: Borderline size mediastinal lymph nodes with a precarinal lymph node measuring 10 mm in short axis diameter. This is not significantly changed since prior chest CT. No axillary or hilar adenopathy.  Lungs/Pleura: Extensive ground-glass airspace opacities throughout the lungs, worsening since prior studies. There appears to be bronchiectasis in the left lower lobe. No effusions. Airspace opacities are most prevalent in the lower lobes.  Musculoskeletal: Chest wall soft tissues are unremarkable. No acute bony abnormality.  CT ABDOMEN PELVIS FINDINGS  Hepatobiliary: No focal liver abnormality  is seen. Status post cholecystectomy. No biliary dilatation.  Pancreas: No focal abnormality or ductal dilatation.  Spleen: No focal abnormality.  Normal size.  Adrenals/Urinary Tract: Large cyst in the left kidney measuring 8.9 cm. No hydronephrosis. No visible stones or hydronephrosis. Adrenal glands and urinary bladder unremarkable.  Stomach/Bowel: Postoperative changes in the rectosigmoid colon. Sigmoid diverticulosis. Large stool burden throughout the colon. Stomach and small bowel decompressed, unremarkable.  Vascular/Lymphatic: Aortic atherosclerosis. No evidence of aneurysm or adenopathy.  Reproductive: Prostate enlargement with central calcifications.  Other: No free fluid or free air. Bilateral inguinal hernias containing fat.  Musculoskeletal: Degenerative changes in the lumbar spine. No acute bony abnormality.  IMPRESSION: Extensive ground-glass airspace opacities throughout the lungs, worsening since prior study, most pronounced in the mid and lower lung zones. This is most concerning for infectious or inflammatory process. COVID pneumonia could have this appearance or inflammatory fibrosis following COVID pneumonia. Other inflammatory infectious processes are also possible.  Trace left effusion.  Aortic atherosclerosis.  Large stool burden in the colon.  Postoperative changes in the rectosigmoid colon without complicating feature.  Sigmoid diverticulosis.  Bilateral inguinal hernias containing fat.  Prominent prostate with central calcifications.   Electronically Signed   By: Rolm Baptise M.D.   On: 03/02/2020 18:40  ASSESSMENT/PLAN   Bilateral basilar predominant infiltrates with bronchiectasis    Unclear etiology possible atypical infection versus autoimmune disease  -Patient is here today to rule out infectious etiology as well as bronchoscopic evaluation with possible endobronchial biopsy   -Reviewed risks/complications and  benefits with patient, risks include infection, pneumothorax/pneumomediastinum which may require chest tube placement as well as overnight/prolonged hospitalization and possible mechanical ventilation. Other risks include bleeding and very rarely death.  Patient understands risks and wishes to proceed.  Additional questions were answered, and patient is aware that post procedure patient will be going home with family and may experience cough with possible clots on expectoration as well as phlegm which may last few days as well as hoarseness of voice post intubation and mechanical ventilation.     Thank you for allowing me to participate in the care of this patient.   Patient/Family are satisfied with care plan and all questions have been answered.  This document was prepared using Dragon voice recognition software and may include unintentional dictation errors.     Ottie Glazier, M.D.  Div[facility] [place]

## 2020-04-25 ENCOUNTER — Encounter: Payer: Self-pay | Admitting: Pulmonary Disease

## 2020-04-25 ENCOUNTER — Ambulatory Visit: Payer: Medicare Other

## 2020-04-25 LAB — ACID FAST SMEAR (AFB, MYCOBACTERIA): Acid Fast Smear: NEGATIVE

## 2020-04-25 NOTE — Anesthesia Postprocedure Evaluation (Signed)
Anesthesia Post Note  Patient: SANAD FEARNOW  Procedure(s) Performed: FLEXIBLE BRONCHOSCOPY (N/A )  Patient location during evaluation: PACU Anesthesia Type: General Level of consciousness: awake and alert Pain management: pain level controlled Vital Signs Assessment: post-procedure vital signs reviewed and stable Respiratory status: spontaneous breathing, nonlabored ventilation, respiratory function stable and patient connected to nasal cannula oxygen Cardiovascular status: blood pressure returned to baseline and stable Postop Assessment: no apparent nausea or vomiting Anesthetic complications: no   No complications documented.   Last Vitals:  Vitals:   04/24/20 1344 04/24/20 1350  BP:  (!) 142/73  Pulse: 64 61  Resp: 12 16  Temp: (!) 36.2 C 36.5 C  SpO2: 96% 99%    Last Pain:  Vitals:   04/24/20 1350  TempSrc: Oral  PainSc: 0-No pain                 Precious Haws Lorette Peterkin

## 2020-04-26 ENCOUNTER — Ambulatory Visit: Payer: Medicare Other

## 2020-04-26 LAB — CYTOLOGY - NON PAP

## 2020-04-27 ENCOUNTER — Ambulatory Visit: Admission: RE | Admit: 2020-04-27 | Payer: Medicare Other | Source: Ambulatory Visit

## 2020-04-27 DIAGNOSIS — Z510 Encounter for antineoplastic radiation therapy: Secondary | ICD-10-CM | POA: Insufficient documentation

## 2020-04-27 DIAGNOSIS — C20 Malignant neoplasm of rectum: Secondary | ICD-10-CM | POA: Insufficient documentation

## 2020-04-27 LAB — CULTURE, BAL-QUANTITATIVE W GRAM STAIN: Culture: NO GROWTH

## 2020-04-27 LAB — ASPERGILLUS ANTIGEN, BAL/SERUM: Aspergillus Ag, BAL/Serum: 0.05 Index (ref 0.00–0.49)

## 2020-04-28 ENCOUNTER — Ambulatory Visit: Payer: Medicare Other

## 2020-04-29 LAB — ANAEROBIC CULTURE

## 2020-05-01 ENCOUNTER — Inpatient Hospital Stay: Payer: Medicare Other | Attending: Oncology

## 2020-05-01 ENCOUNTER — Ambulatory Visit: Payer: Medicare Other

## 2020-05-01 LAB — PNEUMOCYSTIS PCR: Result Pneumocystis PCR: NEGATIVE

## 2020-05-01 NOTE — Progress Notes (Signed)
Nutrition Follow-up:  Patient with rectal cancer stage II.  Chemotherapy on hold due to lung issue from COVID 19.  Patient followed by Dr Tasia Catchings.  Spoke with patient via phone for nutrition follow-up.  Patient reports that he wants to take care of his lung issue before taking care of cancer.  Reports short of breath especially if talks to much. Reports that he is eating and drinking boost shakes.  Tries to eat small meals.  Reports that he has gained weight.    Medications: reviewed  Labs: reviewed  Anthropometrics:   Weight 12/13 214lb per chart increased from 210 lb  Patient reports recent weight of 217 lb   NUTRITION DIAGNOSIS: Inadequate oral intake improving   INTERVENTION:  Encouraged patient to continue small frequent meals Encouraged high calorie, high protein foods. Encouraged high calorie oral nutrition supplements (350 calories or higher) Patient has RD contact information and will reach out to RD if needed in the future   NEXT VISIT:  No follow-up at this time.  RD available as needed  Garrett Peterson, [place], [place] Registered Dietitian [phone] (mobile)

## 2020-05-02 ENCOUNTER — Ambulatory Visit: Payer: Medicare Other

## 2020-05-03 ENCOUNTER — Ambulatory Visit: Payer: Medicare Other

## 2020-05-04 ENCOUNTER — Ambulatory Visit: Payer: Medicare Other

## 2020-05-08 ENCOUNTER — Ambulatory Visit: Payer: Medicare Other

## 2020-05-08 ENCOUNTER — Inpatient Hospital Stay: Payer: Medicare Other

## 2020-05-09 ENCOUNTER — Ambulatory Visit: Payer: Medicare Other

## 2020-05-10 ENCOUNTER — Ambulatory Visit: Payer: Medicare Other

## 2020-05-11 ENCOUNTER — Other Ambulatory Visit: Payer: Self-pay

## 2020-05-11 ENCOUNTER — Ambulatory Visit
Admission: RE | Admit: 2020-05-11 | Discharge: 2020-05-11 | Disposition: A | Discharge status: Home / Self Care | Payer: Medicare Other | Source: Ambulatory Visit | Attending: Pulmonary Disease | Admitting: Pulmonary Disease

## 2020-05-11 ENCOUNTER — Ambulatory Visit: Payer: Medicare Other

## 2020-05-11 ENCOUNTER — Other Ambulatory Visit: Payer: Self-pay | Admitting: Pulmonary Disease

## 2020-05-11 ENCOUNTER — Other Ambulatory Visit
Admission: RE | Admit: 2020-05-11 | Discharge: 2020-05-11 | Disposition: A | Discharge status: Home / Self Care | Payer: Medicare Other | Source: Ambulatory Visit | Attending: Pulmonary Disease | Admitting: Pulmonary Disease

## 2020-05-11 DIAGNOSIS — R0603 Acute respiratory distress: Secondary | ICD-10-CM

## 2020-05-11 DIAGNOSIS — R600 Localized edema: Secondary | ICD-10-CM | POA: Insufficient documentation

## 2020-05-11 LAB — FIBRIN DERIVATIVES D-DIMER (ARMC ONLY): Fibrin derivatives D-dimer (ARMC): 1115.48 ng/mL (FEU) — ABNORMAL HIGH (ref 0.00–499.00)

## 2020-05-15 ENCOUNTER — Other Ambulatory Visit: Payer: Self-pay | Admitting: Pulmonary Disease

## 2020-05-15 ENCOUNTER — Ambulatory Visit
Admission: RE | Admit: 2020-05-15 | Discharge: 2020-05-15 | Disposition: A | Discharge status: Home / Self Care | Payer: Medicare Other | Source: Ambulatory Visit | Attending: Pulmonary Disease | Admitting: Pulmonary Disease

## 2020-05-15 ENCOUNTER — Ambulatory Visit: Payer: Medicare Other

## 2020-05-15 ENCOUNTER — Other Ambulatory Visit: Payer: Self-pay

## 2020-05-15 DIAGNOSIS — R7989 Other specified abnormal findings of blood chemistry: Secondary | ICD-10-CM

## 2020-05-15 LAB — POCT I-STAT CREATININE: Creatinine, Ser: 1.1 mg/dL (ref 0.61–1.24)

## 2020-05-15 MED ORDER — IOHEXOL 350 MG/ML SOLN
75.0000 mL | Freq: Once | INTRAVENOUS | Status: AC | PRN
  Administered 2020-05-15: 75 mL via INTRAVENOUS

## 2020-05-16 ENCOUNTER — Emergency Department (HOSPITAL_COMMUNITY): Payer: Medicare Other

## 2020-05-16 ENCOUNTER — Other Ambulatory Visit: Payer: Self-pay

## 2020-05-16 ENCOUNTER — Telehealth: Payer: Self-pay | Admitting: Physician Assistant

## 2020-05-16 ENCOUNTER — Inpatient Hospital Stay (HOSPITAL_COMMUNITY)
Admission: EM | Admit: 2020-05-16 | Discharge: 2020-05-19 | DRG: 197 | Disposition: A | Discharge status: Home / Self Care | Payer: Medicare Other | Source: Ambulatory Visit | Attending: Student in an Organized Health Care Education/Training Program | Admitting: Student in an Organized Health Care Education/Training Program

## 2020-05-16 ENCOUNTER — Encounter (HOSPITAL_COMMUNITY): Payer: Self-pay | Admitting: Emergency Medicine

## 2020-05-16 ENCOUNTER — Ambulatory Visit: Payer: Medicare Other

## 2020-05-16 DIAGNOSIS — E785 Hyperlipidemia, unspecified: Secondary | ICD-10-CM | POA: Diagnosis present

## 2020-05-16 DIAGNOSIS — Z720 Tobacco use: Secondary | ICD-10-CM | POA: Diagnosis not present

## 2020-05-16 DIAGNOSIS — R0602 Shortness of breath: Secondary | ICD-10-CM | POA: Diagnosis not present

## 2020-05-16 DIAGNOSIS — Z923 Personal history of irradiation: Secondary | ICD-10-CM

## 2020-05-16 DIAGNOSIS — Z86010 Personal history of colonic polyps: Secondary | ICD-10-CM | POA: Diagnosis not present

## 2020-05-16 DIAGNOSIS — M1990 Unspecified osteoarthritis, unspecified site: Secondary | ICD-10-CM | POA: Diagnosis present

## 2020-05-16 DIAGNOSIS — C20 Malignant neoplasm of rectum: Secondary | ICD-10-CM | POA: Diagnosis present

## 2020-05-16 DIAGNOSIS — J8410 Pulmonary fibrosis, unspecified: Secondary | ICD-10-CM | POA: Diagnosis present

## 2020-05-16 DIAGNOSIS — Z20822 Contact with and (suspected) exposure to covid-19: Secondary | ICD-10-CM | POA: Diagnosis present

## 2020-05-16 DIAGNOSIS — R7989 Other specified abnormal findings of blood chemistry: Secondary | ICD-10-CM | POA: Diagnosis present

## 2020-05-16 DIAGNOSIS — I2510 Atherosclerotic heart disease of native coronary artery without angina pectoris: Secondary | ICD-10-CM | POA: Diagnosis present

## 2020-05-16 DIAGNOSIS — J470 Bronchiectasis with acute lower respiratory infection: Secondary | ICD-10-CM | POA: Diagnosis present

## 2020-05-16 DIAGNOSIS — I700 Atherosclerosis of aorta: Secondary | ICD-10-CM | POA: Diagnosis present

## 2020-05-16 DIAGNOSIS — R600 Localized edema: Secondary | ICD-10-CM | POA: Diagnosis present

## 2020-05-16 DIAGNOSIS — J9690 Respiratory failure, unspecified, unspecified whether with hypoxia or hypercapnia: Secondary | ICD-10-CM | POA: Diagnosis present

## 2020-05-16 DIAGNOSIS — R7303 Prediabetes: Secondary | ICD-10-CM | POA: Diagnosis present

## 2020-05-16 DIAGNOSIS — J9611 Chronic respiratory failure with hypoxia: Secondary | ICD-10-CM | POA: Diagnosis present

## 2020-05-16 DIAGNOSIS — T451X5A Adverse effect of antineoplastic and immunosuppressive drugs, initial encounter: Secondary | ICD-10-CM | POA: Diagnosis present

## 2020-05-16 DIAGNOSIS — Z825 Family history of asthma and other chronic lower respiratory diseases: Secondary | ICD-10-CM | POA: Diagnosis not present

## 2020-05-16 DIAGNOSIS — Z95828 Presence of other vascular implants and grafts: Secondary | ICD-10-CM

## 2020-05-16 DIAGNOSIS — Z7952 Long term (current) use of systemic steroids: Secondary | ICD-10-CM | POA: Diagnosis not present

## 2020-05-16 DIAGNOSIS — Z951 Presence of aortocoronary bypass graft: Secondary | ICD-10-CM | POA: Diagnosis not present

## 2020-05-16 DIAGNOSIS — U099 Post covid-19 condition, unspecified: Secondary | ICD-10-CM | POA: Diagnosis present

## 2020-05-16 DIAGNOSIS — I10 Essential (primary) hypertension: Secondary | ICD-10-CM | POA: Diagnosis present

## 2020-05-16 DIAGNOSIS — Z79899 Other long term (current) drug therapy: Secondary | ICD-10-CM

## 2020-05-16 DIAGNOSIS — J189 Pneumonia, unspecified organism: Secondary | ICD-10-CM

## 2020-05-16 DIAGNOSIS — I340 Nonrheumatic mitral (valve) insufficiency: Secondary | ICD-10-CM | POA: Diagnosis not present

## 2020-05-16 DIAGNOSIS — I361 Nonrheumatic tricuspid (valve) insufficiency: Secondary | ICD-10-CM | POA: Diagnosis not present

## 2020-05-16 DIAGNOSIS — Z85828 Personal history of other malignant neoplasm of skin: Secondary | ICD-10-CM

## 2020-05-16 DIAGNOSIS — R0902 Hypoxemia: Secondary | ICD-10-CM

## 2020-05-16 DIAGNOSIS — J849 Interstitial pulmonary disease, unspecified: Secondary | ICD-10-CM | POA: Diagnosis present

## 2020-05-16 LAB — RESP PANEL BY RT-PCR (RSV, FLU A&B, COVID)  RVPGX2
Influenza A by PCR: NEGATIVE
Influenza B by PCR: NEGATIVE
Resp Syncytial Virus by PCR: NEGATIVE
SARS Coronavirus 2 by RT PCR: NEGATIVE

## 2020-05-16 LAB — CBC WITH DIFFERENTIAL/PLATELET
Abs Immature Granulocytes: 0.07 10*3/uL (ref 0.00–0.07)
Basophils Absolute: 0.1 10*3/uL (ref 0.0–0.1)
Basophils Relative: 1 %
Eosinophils Absolute: 0.3 10*3/uL (ref 0.0–0.5)
Eosinophils Relative: 2 %
HCT: 44.7 % (ref 39.0–52.0)
Hemoglobin: 15 g/dL (ref 13.0–17.0)
Immature Granulocytes: 1 %
Lymphocytes Relative: 8 %
Lymphs Abs: 1.2 10*3/uL (ref 0.7–4.0)
MCH: 33.5 pg (ref 26.0–34.0)
MCHC: 33.6 g/dL (ref 30.0–36.0)
MCV: 99.8 fL (ref 80.0–100.0)
Monocytes Absolute: 1 10*3/uL (ref 0.1–1.0)
Monocytes Relative: 7 %
Neutro Abs: 11.8 10*3/uL — ABNORMAL HIGH (ref 1.7–7.7)
Neutrophils Relative %: 81 %
Platelets: 195 10*3/uL (ref 150–400)
RBC: 4.48 MIL/uL (ref 4.22–5.81)
RDW: 12.9 % (ref 11.5–15.5)
WBC: 14.5 10*3/uL — ABNORMAL HIGH (ref 4.0–10.5)
nRBC: 0 % (ref 0.0–0.2)

## 2020-05-16 LAB — COMPREHENSIVE METABOLIC PANEL
ALT: 53 U/L — ABNORMAL HIGH (ref 0–44)
AST: 36 U/L (ref 15–41)
Albumin: 3.6 g/dL (ref 3.5–5.0)
Alkaline Phosphatase: 111 U/L (ref 38–126)
Anion gap: 9 (ref 5–15)
BUN: 9 mg/dL (ref 8–23)
CO2: 28 mmol/L (ref 22–32)
Calcium: 9.3 mg/dL (ref 8.9–10.3)
Chloride: 101 mmol/L (ref 98–111)
Creatinine, Ser: 1.12 mg/dL (ref 0.61–1.24)
GFR, Estimated: >60 mL/min (ref >60)
Glucose, Bld: 161 mg/dL — ABNORMAL HIGH (ref 70–99)
Potassium: 4.2 mmol/L (ref 3.5–5.1)
Sodium: 138 mmol/L (ref 135–145)
Total Bilirubin: 0.8 mg/dL (ref 0.3–1.2)
Total Protein: 7.2 g/dL (ref 6.5–8.1)

## 2020-05-16 LAB — PROCALCITONIN: Procalcitonin: <0.1 ng/mL

## 2020-05-16 MED ORDER — POLYETHYLENE GLYCOL 3350 17 G PO PACK: 17.0000 g | PACK | Freq: Every day | ORAL | Status: DC | PRN

## 2020-05-16 MED ORDER — PREDNISONE 20 MG PO TABS
20.0000 mg | ORAL_TABLET | Freq: Every day | ORAL | Status: DC
  Administered 2020-05-17 – 2020-05-19 (×3): 20 mg via ORAL
  Filled 2020-05-16 (×3): qty 1

## 2020-05-16 MED ORDER — TEMAZEPAM 15 MG PO CAPS
30.0000 mg | ORAL_CAPSULE | Freq: Every day | ORAL | Status: DC
  Administered 2020-05-18: 30 mg via ORAL
  Filled 2020-05-16 (×2): qty 2

## 2020-05-16 MED ORDER — METOPROLOL TARTRATE 25 MG PO TABS
25.0000 mg | ORAL_TABLET | Freq: Two times a day (BID) | ORAL | Status: DC
  Administered 2020-05-17 – 2020-05-19 (×6): 25 mg via ORAL
  Filled 2020-05-16 (×5): qty 1

## 2020-05-16 MED ORDER — SULFAMETHOXAZOLE-TRIMETHOPRIM 400-80 MG PO TABS
1.0000 | ORAL_TABLET | Freq: Two times a day (BID) | ORAL | Status: DC
  Administered 2020-05-17 – 2020-05-19 (×5): 1 via ORAL
  Filled 2020-05-16 (×6): qty 1

## 2020-05-16 NOTE — ED Provider Notes (Signed)
[facility] EMERGENCY DEPARTMENT Provider Note   CSN: [id number] Arrival date & time: 05/16/20  7858     History Chief Complaint  Patient presents with   Pneumonia   sent by MD    [name] is a 75 y.o. male.  HPI Patient is a 75 year old male who presents the emergency department due to worsening shortness of breath.  Has been an ongoing issue for the past 2 months.  He was sent here by his pulmonologist Dr. Lanney Gins.  Per his pulmonologist, patient has colon cancer and is followed by oncology.  He has been receiving FOLFOX chemotherapy.  Patient has had refractory pneumonia for the past 2 months.  He previously completed 2 rounds of antibiotics and then has been on Bactrim as well as prednisone for the past 2 months.  His CT initially showed infiltrates and this was repeated yesterday which shows worsening findings.  Imaging findings noted below.  He had a bronchoscopy done by his pulmonologist.  Consideration for possible opportunistic infection.  Aspergillus, anaerobic, aerobic, HSV.  All findings were negative.  ILD?  Reaction to FOLFOX chemotherapy?  His pulmonologist requests possible admission for patient.  He states there is not currently a cardiothoracic surgeon at [facility].  States that he feels that ILD evaluation and/or surgical lung biopsy are possible next steps.  CTA of the chest performed yesterday.  Findings below:  IMPRESSION: 1. No definite evidence of pulmonary embolus. 2. Bilateral patchy airspace opacities are noted which are slightly increased compared to prior exam, concerning for multifocal pneumonia or potentially post infectious scarring from previous infection. Some degree of bronchiectasis is also noted in both lower lobes. 3. Aortic atherosclerosis.     Past Medical History:  Diagnosis Date   Actinic keratosis    Adenomatous polyp of colon    Arthritis    Basal cell carcinoma    CAD (coronary artery disease)    Cancer  ([place]) 2021   rectal   Hyperlipidemia    Pneumonia    Pre-diabetes    Respiratory failure with hypoxia [facility])     Patient Active Problem List   Diagnosis Date Noted   Acute respiratory failure with hypoxia ([place]) 03/03/2020   Pneumonia due to COVID-19 virus 03/03/2020   HTN (hypertension) 03/03/2020   Generalized weakness    Thrombocytopenia ([place]) 12/29/2019   Port-A-Cath in place 11/02/2019   Encounter for antineoplastic chemotherapy 11/02/2019   Rectal cancer ([place]) 09/28/2019   Colon cancer ([place]) 09/20/2019   Goals of care, counseling/discussion 09/12/2019   CAD (coronary artery disease) 07/15/2018   Hx of adenomatous colonic polyps 07/15/2018   Hyperlipidemia 07/15/2018   Personal history of other malignant neoplasm of skin 06/27/2014    Past Surgical History:  Procedure Laterality Date   APPENDECTOMY     CARDIAC CATHETERIZATION     PT DENIES   COLONOSCOPY     2011,2015,2021   CORONARY ARTERY BYPASS GRAFT     FLEXIBLE BRONCHOSCOPY N/A 04/24/2020   Procedure: FLEXIBLE BRONCHOSCOPY;  Surgeon: Ottie Glazier, MD;  Location: [facility] ORS;  Service: Thoracic;  Laterality: N/A;   IR SINUS/FIST TUBE CHK-NON GI  10/27/2019   open heart surgery     PORTACATH PLACEMENT Right 10/13/2019   Procedure: INSERTION PORT-A-CATH;  Surgeon: Herbert Pun, MD;  Location: [facility] ORS;  Service: General;  Laterality: Right;   ROBOT ASSISTED LAPAROSCOPIC PARTIAL COLECTOMY  09/2019       Family History  Problem Relation Age of Onset   Lung disease  Father     Social History   Tobacco Use   Smoking status: Never Smoker   Smokeless tobacco: Former Systems developer    Types: Nurse, children's Use: Never used  Substance Use Topics   Alcohol use: Yes    Alcohol/week: 0.0 standard drinks    Comment: OCCAS   Drug use: Never    Home Medications Prior to Admission medications   Medication Sig Start Date End Date Taking? Authorizing Provider  albuterol  (VENTOLIN HFA) 108 (90 Base) MCG/ACT inhaler Inhale 2 puffs into the lungs every 6 (six) hours as needed for wheezing or shortness of breath. Patient not taking: Reported on 04/18/2020 03/04/20   Flora Lipps, MD  atorvastatin (LIPITOR) 20 MG tablet Take 20 mg by mouth every evening.     [provider]  capecitabine (XELODA) 500 MG tablet Take 3 tablets (1,500 mg total) by mouth 2 (two) times daily after a meal. Take Monday through Friday during radiation. Patient not taking: Reported on 04/18/2020 03/21/20   Earlie Server, MD  dextromethorphan-guaiFENesin [facility] DM) 30-600 MG 12hr tablet Take 1 tablet by mouth 2 (two) times daily as needed for cough. Patient not taking: Reported on 04/18/2020 03/04/20   Flora Lipps, MD  lidocaine-prilocaine (EMLA) cream Apply to affected area once Patient not taking: Reported on 04/18/2020 10/05/19   Earlie Server, MD  metoprolol tartrate (LOPRESSOR) 25 MG tablet Take 25 mg by mouth 2 (two) times daily. 11/03/19   [provider]  naproxen sodium (ALEVE) 220 MG tablet Take 220 mg by mouth daily as needed (pain).    [provider]  predniSONE (DELTASONE) 10 MG tablet Take 10 mg by mouth daily. Take with 20 mg to equal 30 mg daily    [provider]  predniSONE (DELTASONE) 20 MG tablet Take 20 mg by mouth daily. Take with 10 mg to equal 30 mg daily 03/14/20   [provider]  senna (SENOKOT) 8.6 MG TABS tablet Take 2 tablets (17.2 mg total) by mouth daily. Patient not taking: Reported on 03/03/2020 11/09/19   Earlie Server, MD  sertraline (ZOLOFT) 25 MG tablet Take 1 tablet (25 mg total) by mouth daily. Patient not taking: Reported on 03/03/2020 02/02/20   Earlie Server, MD  temazepam (RESTORIL) 30 MG capsule Take 30 mg by mouth at bedtime.  06/09/14   [provider]    Allergies    Patient has no known allergies.  Review of Systems   Review of Systems  All other systems reviewed and are negative. Physical Exam Updated  Vital Signs BP (!) 169/83    Pulse 74    Temp 98.3 F (36.8 C) (Oral)    Resp 20    Ht _0  (1.753 m)    Wt 98.9 kg    SpO2 96%    BMI 32.19 kg/m   Physical Exam Vitals and nursing note reviewed.  Constitutional:      General: He is not in acute distress.    Appearance: Normal appearance. He is not ill-appearing, toxic-appearing or diaphoretic.  HENT:     Head: Normocephalic and atraumatic.     Right Ear: External ear normal.     Left Ear: External ear normal.     Nose: Nose normal.     Mouth/Throat:     Mouth: Mucous membranes are moist.     Pharynx: Oropharynx is clear. No oropharyngeal exudate or posterior oropharyngeal erythema.  Eyes:     Extraocular Movements: Extraocular  movements intact.  Cardiovascular:     Rate and Rhythm: Normal rate and regular rhythm.     Pulses: Normal pulses.     Heart sounds: Normal heart sounds. No murmur heard. No friction rub. No gallop.   Pulmonary:     Effort: Pulmonary effort is normal. No respiratory distress.     Breath sounds: Normal breath sounds. No stridor. No wheezing, rhonchi or rales.     Comments: Saturating in the mid 90s on 4 L via nasal cannula. Speaking in clear complete sentences. Abdominal:     General: Abdomen is flat.     Tenderness: There is no abdominal tenderness.  Musculoskeletal:        General: Normal range of motion.     Cervical back: Normal range of motion and neck supple. No tenderness.  Skin:    General: Skin is warm and dry.  Neurological:     General: No focal deficit present.     Mental Status: He is alert and oriented to person, place, and time.  Psychiatric:        Mood and Affect: Mood normal.        Behavior: Behavior normal.     ED Results / Procedures / Treatments   Labs (all labs ordered are listed, but only abnormal results are displayed) Labs Reviewed  COMPREHENSIVE METABOLIC PANEL - Abnormal; Notable for the following components:      Result Value   Glucose, Bld 161 (*)    ALT 53 (*)     All other components within normal limits  CBC WITH DIFFERENTIAL/PLATELET - Abnormal; Notable for the following components:   WBC 14.5 (*)    Neutro Abs 11.8 (*)    All other components within normal limits  RESP PANEL BY RT-PCR (RSV, FLU A&B, COVID)  [id number]  PROCALCITONIN    EKG None  Radiology DG Chest 2 View  Result Date: 05/16/2020 CLINICAL DATA:  Shortness of breath. EXAM: CHEST - 2 VIEW COMPARISON:  03/03/2020 FINDINGS: Diffuse interstitial lung disease again noted with patchy areas of airspace opacity in the lower lungs bilaterally, similar to prior but slightly more confluent today. Cardiopericardial silhouette is at upper limits of normal for size. Right Port-A-Cath again noted. IMPRESSION: Diffuse interstitial lung disease with some more confluent patchy opacity in the lower lungs bilaterally, increased since prior chest x-ray. Electronically Signed   By: Misty Stanley M.D.   On: 05/16/2020 10:56   CT ANGIO CHEST PE W OR WO CONTRAST  Result Date: 05/15/2020 CLINICAL DATA:  Positive D-dimer test. EXAM: CT ANGIOGRAPHY CHEST WITH CONTRAST TECHNIQUE: Multidetector CT imaging of the chest was performed using the standard protocol during bolus administration of intravenous contrast. Multiplanar CT image reconstructions and MIPs were obtained to evaluate the vascular anatomy. CONTRAST:  70m OMNIPAQUE IOHEXOL 350 MG/ML SOLN COMPARISON:  March 02, 2020. FINDINGS: Cardiovascular: Satisfactory opacification of the pulmonary arteries to the segmental level. No evidence of pulmonary embolism. Normal heart size. No pericardial effusion. Status post coronary bypass graft. Atherosclerosis of thoracic aorta is noted without aneurysm formation. Mediastinum/Nodes: No enlarged mediastinal, hilar, or axillary lymph nodes. Thyroid gland, trachea, and esophagus demonstrate no significant findings. Lungs/Pleura: No pneumothorax or pleural effusion is noted. Bilateral patchy airspace opacities are noted  which are slightly increased compared to prior exam, concerning for multifocal pneumonia or potentially post infectious scarring from previous infection. Some degree of bronchiectasis is also noted in both lower lobes. Upper Abdomen: No acute abnormality. Musculoskeletal: No chest wall abnormality. No  acute or significant osseous findings. Review of the MIP images confirms the above findings. IMPRESSION: 1. No definite evidence of pulmonary embolus. 2. Bilateral patchy airspace opacities are noted which are slightly increased compared to prior exam, concerning for multifocal pneumonia or potentially post infectious scarring from previous infection. Some degree of bronchiectasis is also noted in both lower lobes. 3. Aortic atherosclerosis. Aortic Atherosclerosis (ICD10-I70.0). Electronically Signed   By: Marijo Conception M.D.   On: 05/15/2020 12:18    Procedures Procedures (including critical care time)  Medications Ordered in ED Medications - No data to display  ED Course  I have reviewed the triage vital signs and the nursing notes.  Pertinent labs & imaging results that were available during my care of the patient were reviewed by me and considered in my medical decision making (see chart for details).  Clinical Course as of 05/16/20 2157  Tue May 16, 2020  1632 WBC(!): 14.5 [LJ]  1632 NEUT#(!): 11.8 [LJ]  1633 DG Chest 2 View Diffuse interstitial lung disease with some more confluent patchy opacity in the lower lungs bilaterally, increased since prior chest x-ray. [LJ]    Clinical Course User Index [LJ] Moody Bruins   MDM Rules/Calculators/A&P                          Patient is a 75 year old male who presents the emergency department with refractory pneumonia as well as worsening hypoxia.  Patient was sent to the emergency department by his pulmonologist for possible admission and surgical lung biopsy.  Patient has been discussed with pulmonology as well as cardiothoracic  surgery.  Unfortunately, given patient's worsening symptoms and need for surgical lung biopsy, it appears that outpatient follow-up is not reasonable at this time.  We will discuss with the medicine team for admission.  Respiratory panel is negative.  Patient has been discussed with Dr. Roxan Hockey with cardiothoracic surgery.  Patient also discussed with Dr. Lamonte Sakai with pulmonology.  They will both consult.  Final Clinical Impression(s) / ED Diagnoses Final diagnoses:  Chronic pneumonia  Hypoxia   Rx / DC Orders ED Discharge Orders    None       Rayna Sexton, PA-C 05/16/20 2158    Gareth Morgan, MD 05/23/20 [phone]

## 2020-05-16 NOTE — ED Triage Notes (Addendum)
Sent by MD to be admitted-- told he had pneumonia-- covid neg on Thursday-- has been vaccinated x 3-- On home O2 4L/M/[place] consistently -- able to speak in complete sentences/. No shortness of breath  Short of breath started 2 months Also has a port a cath for chemo

## 2020-05-17 ENCOUNTER — Encounter (HOSPITAL_COMMUNITY): Payer: Self-pay | Admitting: Student in an Organized Health Care Education/Training Program

## 2020-05-17 ENCOUNTER — Ambulatory Visit: Payer: Medicare Other

## 2020-05-17 DIAGNOSIS — J849 Interstitial pulmonary disease, unspecified: Secondary | ICD-10-CM

## 2020-05-17 DIAGNOSIS — J9611 Chronic respiratory failure with hypoxia: Secondary | ICD-10-CM

## 2020-05-17 DIAGNOSIS — I2510 Atherosclerotic heart disease of native coronary artery without angina pectoris: Secondary | ICD-10-CM

## 2020-05-17 LAB — CBC
HCT: 43.5 % (ref 39.0–52.0)
Hemoglobin: 15.1 g/dL (ref 13.0–17.0)
MCH: 34.1 pg — ABNORMAL HIGH (ref 26.0–34.0)
MCHC: 34.7 g/dL (ref 30.0–36.0)
MCV: 98.2 fL (ref 80.0–100.0)
Platelets: 173 10*3/uL (ref 150–400)
RBC: 4.43 MIL/uL (ref 4.22–5.81)
RDW: 12.8 % (ref 11.5–15.5)
WBC: 10.9 10*3/uL — ABNORMAL HIGH (ref 4.0–10.5)
nRBC: 0 % (ref 0.0–0.2)

## 2020-05-17 LAB — PROTIME-INR
INR: 1 (ref 0.8–1.2)
INR: 1.1 (ref 0.8–1.2)
Prothrombin Time: 13.2 seconds (ref 11.4–15.2)
Prothrombin Time: 13.3 seconds (ref 11.4–15.2)

## 2020-05-17 LAB — COMPREHENSIVE METABOLIC PANEL
ALT: 52 U/L — ABNORMAL HIGH (ref 0–44)
AST: 37 U/L (ref 15–41)
Albumin: 3.2 g/dL — ABNORMAL LOW (ref 3.5–5.0)
Alkaline Phosphatase: 105 U/L (ref 38–126)
Anion gap: 12 (ref 5–15)
BUN: 12 mg/dL (ref 8–23)
CO2: 31 mmol/L (ref 22–32)
Calcium: 9.1 mg/dL (ref 8.9–10.3)
Chloride: 96 mmol/L — ABNORMAL LOW (ref 98–111)
Creatinine, Ser: 1.11 mg/dL (ref 0.61–1.24)
GFR, Estimated: >60 mL/min (ref >60)
Glucose, Bld: 187 mg/dL — ABNORMAL HIGH (ref 70–99)
Potassium: 4.1 mmol/L (ref 3.5–5.1)
Sodium: 139 mmol/L (ref 135–145)
Total Bilirubin: 0.6 mg/dL (ref 0.3–1.2)
Total Protein: 6.7 g/dL (ref 6.5–8.1)

## 2020-05-17 LAB — MRSA PCR SCREENING: MRSA by PCR: NEGATIVE

## 2020-05-17 MED ORDER — CHLORHEXIDINE GLUCONATE CLOTH 2 % EX PADS
6.0000 | MEDICATED_PAD | Freq: Every day | CUTANEOUS | Status: DC
  Administered 2020-05-19: 6 via TOPICAL

## 2020-05-17 NOTE — Consult Note (Addendum)
NAME:  Garrett Peterson, MRN:  [id number], DOB:  31-Dec-1945, LOS: 1 ADMISSION DATE:  05/16/2020, CONSULTATION DATE:  05/17/2020 REFERRING MD:  Dr. Vernia Buff CHIEF COMPLAINT:  Hypoxia   Brief History:  [name] is a 75 y/o gentleman with a PMHx of rectal carcinoma s/p resection on FOLFOX chemotherapy with progressively worsening DOE and SOB, sent to the ED by his pulmonologist for evaluation of chronic hypoxic respiratory failure.   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  Patient was positive for Covid in October and admitted to the hospital 3 weeks after for worsening shortness of breath. He was given Z-Pak and steroid at discharge which did not relieve his symptoms. He was then referred to pulmonology in [facility] Dr. Lanney Gins who performed a bronchoscopy on 12/13/121.  Bronchoalveolar lavage study showed negative aerobic culture, aspergillosis testing, and acid-fast smear/culture.  Cytology was negative for atypia.  Patient was taking prednisone for 2 months and also started on Bactrim last week.  CT chest 02/2020 shows extensive groundglass appearance throughout.  CTA done yesterday was negative for PE and showed bilateral patchy opacities concerning for multifocal pneumonia versus postinfectious scarring.  Aortic atherosclerosis also seen.  Doppler ultrasound was negative for DVT bilaterally.  Patient states that he is short of breath with exertion and bent forward. Better with rest.  He denies chest pain with exertion or orthopnea.  He endorses LE edema.  Patient reports that he has had echocardiogram done in the past and results was normal.  No echo results found in the chart.  Patient was diagnosed with rectal cancer in June, has had resection  surgery and started on chemotherapy with FOLFOX in July.  Currently stopping chemotherapy given pulmonary issue.  Past Medical History:  Rectal carcinoma s/p resection receiving chemotherapy and radiation, chronic hypoxic respiratory failure, HTN, HLD, CAD s/p CABG, pre-diabetes   Significant Hospital Events:  01/05 >> Admitted to [facility]  Consults:  TCCM  Procedures:  None  Significant Diagnostic Tests:   CTA (01/05):  1. No definite evidence of pulmonary embolus. 2. Bilateral patchy airspace opacities are noted which are slightly increased compared to prior exam, concerning for multifocal pneumonia or potentially post infectious scarring from previous infection. Some degree of bronchiectasis is also noted in both lower lobes. 3. Aortic atherosclerosis.  Micro Data:  None   Antimicrobials:  None    Interim History / Subjective:   [name] was evaluated at bedside this morning.  At this time, he states that he is feeling at his baseline without any increased work of breathing, shortness of breath or dyspnea on exertion.  He describes his breathing as gradually worsening since September.  He states starting in September, he would have episodes of sudden onset dyspnea with a choking sensation that would last for several minutes.  The episodes became more more frequent until they were daily and then eventually constant.  He notes that approximately 4 weeks ago, he required set up with home oxygen at 4 L.  He notes 2 episodes of diaphoresis in the past month, but denies any fever, chills, congestion, sore throat, nausea, vomiting, diarrhea, abdominal pain.  He denies any night sweats or weight loss.  Garrett Peterson notes that he was told he had Covid pneumonia in the beginning  of October, however he never had formal testing for COVID-19, so it is unverified.  Mr. Nyoka Peterson denies any prior knowledge of having lung disease including interstitial lung disease.  He is a lifetime  non-smoker.  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  Did not work on the line, but had regular exposure -Denies any exposure to birds -Denies any exposure to construction or mining  Objective   Blood pressure 131/85, pulse 66, temperature 97.7 F (36.5 C), temperature source Oral, resp. rate 18, height 5' 8" (1.727 m), weight 96.4 kg, SpO2 96 %.        Intake/Output Summary (Last 24 hours) at 05/17/2020 1048 Last data filed at 05/17/2020 0000 Gross per 24 hour  Intake 0 ml  Output --  Net 0 ml   Filed Weights   05/16/20 0952 05/16/20 2355  Weight: 98.9 kg 96.4 kg   Examination: Physical Exam Vitals and nursing note reviewed.  Constitutional:      General: He is not in acute distress.    Appearance: He is obese.  HENT:     Head: Normocephalic and atraumatic.     Mouth/Throat:     Mouth: Mucous membranes are moist.     Pharynx: Oropharynx is clear.  Eyes:     Extraocular Movements: Extraocular movements intact.     Pupils: Pupils are equal, round, and reactive to light.  Cardiovascular:     Rate and Rhythm: Normal rate and regular rhythm.     Heart sounds: No murmur heard.   Pulmonary:     Effort: No tachypnea, accessory muscle usage or respiratory distress.     Breath sounds: Rales (throughout all lung fields, right worse than left) present. No decreased breath sounds or wheezing.  Abdominal:     General: Bowel sounds are normal.     Palpations: Abdomen is soft.  Musculoskeletal:     Comments: No joint erythema or edema. No lower extremity edema.  Skin:    General: Skin is warm and dry.     Comments: No rashes.  Neurological:     General: No focal deficit present.     Mental Status: He is alert and oriented to person, place, and time. Mental status is at baseline.  Psychiatric:        Mood and Affect: Mood normal.        Behavior: Behavior  normal.    Relevant Previous Labs:  03/14/2020 CRP: 5 ACE level: 26 Rheumatoid arthritis factor: <10 ANA: Negative A.Fumigatus #1 Abs - [facility] Negative Negative   Micropolyspora faeni, IgG - [facility] Negative Negative   Thermoactinomyces vulgaris, IgG - [facility] Negative Negative   A. Pullulans Abs - [facility] Negative Negative   Thermoact. Saccharii - [facility] Negative Negative   Pigeon Serum Abs - [facility] Negative Negative    Pulmonary Function Test (03/14/2020) FVC 1.38 liters ( 39%), fev1 1.19 liters (43%), ratio 86, fef l/s 58%, exp flow volume loop is restrictive, c/w severe restriction  TTE (04/03/2020)  NORMAL LEFT VENTRICULAR SYSTOLIC FUNCTION (EF >72%) NORMAL RIGHT VENTRICULAR SYSTOLIC FUNCTION  MILD VALVULAR REGURGITATION NO VALVULAR STENOSIS  Normal pulmonary pressure  Assessment & Plan:   # Chronic Hypoxic Respiratory Failure  # Pulmonary Fibrosis   Mr. Nyoka Peterson presents with a ~62-monthhistory of dyspnea with rest and on exertion that has progressed to hypoxia with need for supplemental oxygen in the past 4 weeks.  All  previous CT scans that included any imaging of the lung were personally reviewed.  CT scan dating back to 2011 did have some evidence of very mild changes consistent with ILD in the lung bases.  Until 2021, there are no additional CT scans that include the lung bases.  After diagnosis with rectal carcinoma, patient had a pan scan that showed progression of reticular and nodular densities in the lower lung zones consistent with ILD. He initiated chemotherapy with FOLFOX protocol on November 02, 2019.  After the initiation of chemo, patient had a CT chest in October that showed profound progression of pulmonary fibrosis. Around this time, patient had PFTs consistent with severe restriction pattern. CTA on this admission shows continued marked worsening.   Given that patient has had rapid pulmonary deterioration in the past 6 months since starting chemotherapy, he is  certainly at risk of having a drug induced lung injury. Oxaliplatin is the most likely culprit in this case and has been associated with pulmonary toxicity and pulmonary fibrosis.  Given his recent history of pneumonia with unknown pathogen, he could also be experiencing post infectious scarring, however would not expect such progressive hypoxia.  However given that patient's evidence of ILD on imaging precedes his chemotherapy and dates back at least 10 years, this may be a progression of his underlying ILD.  We will first start with a bronchoscopy with a cell count to evaluate for eosinophils to rule out eosinophilic pneumonitis from oxaliplatin.  Eventually patient may still require a biopsy, however it is quite high risk given acute deterioration can occur post biopsy, and patient does not have much reserve left  - Bronchoscopy tomorrow with cell count, cytology, cultures - Repeat CRP, ESR - NPO at midnight - Continue home supplemental oxygen of 4L - Continue Prednisone 20 mg daily - Continue Bactrim prophylaxis  - ILD Questionnaire provided  Best practice (evaluated daily)  Per primary  Goals of Care:  Per primary  Review of Systems:   Negative except as noted above.   Past Medical History:  He,  has a past medical history of Actinic keratosis, Adenomatous polyp of colon, Arthritis, Basal cell carcinoma, CAD (coronary artery disease), Cancer ([place]) (2021), Hyperlipidemia, Pneumonia, Pre-diabetes, and Respiratory failure with hypoxia ([place]).   Surgical History:   Past Surgical History:  Procedure Laterality Date  . APPENDECTOMY    . CARDIAC CATHETERIZATION     PT DENIES  . COLONOSCOPY     2011,2015,2021  . CORONARY ARTERY BYPASS GRAFT    . FLEXIBLE BRONCHOSCOPY N/A 04/24/2020   Procedure: FLEXIBLE BRONCHOSCOPY;  Surgeon: Ottie Glazier, MD;  Location: [facility] ORS;  Service: Thoracic;  Laterality: N/A;  . IR SINUS/FIST TUBE CHK-NON GI  10/27/2019  . open heart surgery    . PORTACATH  PLACEMENT Right 10/13/2019   Procedure: INSERTION PORT-A-CATH;  Surgeon: Herbert Pun, MD;  Location: [facility] ORS;  Service: General;  Laterality: Right;  . ROBOT ASSISTED LAPAROSCOPIC PARTIAL COLECTOMY  09/2019    Social History:   reports that he has never smoked. He has quit using smokeless tobacco.  His smokeless tobacco use included chew. He reports current alcohol use. He reports that he does not use drugs.   Family History:  His family history includes Lung disease in his father.   Allergies No Known Allergies   Home Medications  Prior to Admission medications   Medication Sig Start Date End Date Taking? Authorizing Provider  metoprolol tartrate (LOPRESSOR) 25 MG tablet Take 25 mg by mouth 2 (  two) times daily. 11/03/19  Yes [provider]  predniSONE (DELTASONE) 20 MG tablet Take 20 mg by mouth daily with breakfast.   Yes [provider]  temazepam (RESTORIL) 30 MG capsule Take 30 mg by mouth at bedtime.  06/09/14  Yes [provider]  albuterol (VENTOLIN HFA) 108 (90 Base) MCG/ACT inhaler Inhale 2 puffs into the lungs every 6 (six) hours as needed for wheezing or shortness of breath. Patient not taking: No sig reported 03/04/20   Flora Lipps, MD  capecitabine (XELODA) 500 MG tablet Take 3 tablets (1,500 mg total) by mouth 2 (two) times daily after a meal. Take Monday through Friday during radiation. Patient not taking: No sig reported 03/21/20   Earlie Server, MD  dextromethorphan-guaiFENesin [facility] DM) 30-600 MG 12hr tablet Take 1 tablet by mouth 2 (two) times daily as needed for cough. Patient not taking: No sig reported 03/04/20   Pokhrel, Corrie Mckusick, MD  lidocaine-prilocaine (EMLA) cream Apply to affected area once Patient not taking: No sig reported 10/05/19   Earlie Server, MD  sertraline (ZOLOFT) 25 MG tablet Take 1 tablet (25 mg total) by mouth daily. Patient not taking: No sig reported 02/02/20   Earlie Server, MD    Dr. Jose Persia Internal Medicine  PGY-2  05/17/2020, 10:48 AM

## 2020-05-17 NOTE — Plan of Care (Signed)
  Problem: Education: ?Goal: Knowledge of General Education information will improve ?Description: Including pain rating scale, medication(s)/side effects and non-pharmacologic comfort measures ?Outcome: Progressing ?  ?Problem: Health Behavior/Discharge Planning: ?Goal: Ability to manage health-related needs will improve ?Outcome: Progressing ?  ?Problem: Coping: ?Goal: Level of anxiety will decrease ?Outcome: Progressing ?  ?

## 2020-05-17 NOTE — Consult Note (Signed)
Reason for Consult:progressive lung disease Referring Physician: THA  Garrett Peterson is an 75 y.o. male.  HPI: [name] is a 75 yo man with a past history of rectal carcinoma, hypertension, hyperlipidemia and CAD (CABG in 2010). Had surgery for rectal cancer in June. Then treated with FOLFOX. Had started radiation but only had 5 of 25 planned treatments before respiratory issues arose. Presented with worsening shortness of breath. First started noticing SOB in October. Chart indicates he tested positive for COVID although he denies it. Admitted to [facility] in December. Had bronchoscopy which was nondiagnostic. Treated with prednisone and Bactrim. Now presents with worsening shortness of breath.  Currently he is on 4L [place]. SOB with walking 5 or 6 steps. Past Medical History:  Diagnosis Date  . Actinic keratosis   . Adenomatous polyp of colon   . Arthritis   . Basal cell carcinoma   . CAD (coronary artery disease)   . Cancer ([place]) 2021   rectal  . Hyperlipidemia   . Pneumonia   . Pre-diabetes   . Respiratory failure with hypoxia [facility])     Past Surgical History:  Procedure Laterality Date  . APPENDECTOMY    . CARDIAC CATHETERIZATION     PT DENIES  . COLONOSCOPY     2011,2015,2021  . CORONARY ARTERY BYPASS GRAFT    . FLEXIBLE BRONCHOSCOPY N/A 04/24/2020   Procedure: FLEXIBLE BRONCHOSCOPY;  Surgeon: Ottie Glazier, MD;  Location: [facility] ORS;  Service: Thoracic;  Laterality: N/A;  . IR SINUS/FIST TUBE CHK-NON GI  10/27/2019  . open heart surgery    . PORTACATH PLACEMENT Right 10/13/2019   Procedure: INSERTION PORT-A-CATH;  Surgeon: Herbert Pun, MD;  Location: [facility] ORS;  Service: General;  Laterality: Right;  . ROBOT ASSISTED LAPAROSCOPIC PARTIAL COLECTOMY  09/2019    Family History  Problem Relation Age of Onset  . Lung disease Father     Social History:  reports that he has never smoked. He has quit using smokeless tobacco.  His smokeless tobacco use included chew. He  reports current alcohol use. He reports that he does not use drugs.  Allergies: No Known Allergies  Medications:  Scheduled: . [START ON 05/18/2020] Chlorhexidine Gluconate Cloth  6 each Topical Q0600  . metoprolol tartrate  25 mg Oral BID  . predniSONE  20 mg Oral Q breakfast  . sulfamethoxazole-trimethoprim  1 tablet Oral Q12H  . temazepam  30 mg Oral QHS    Results for orders placed or performed during the hospital encounter of 05/16/20 (from the past 48 hour(s))  Resp panel by RT-PCR (RSV, Flu A&B, Covid) Nasopharyngeal Swab     Status: None   Collection Time: 05/16/20 10:06 AM   Specimen: Nasopharyngeal Swab; Nasopharyngeal(NP) swabs in vial transport medium  Result Value Ref Range   SARS Coronavirus 2 by RT PCR NEGATIVE NEGATIVE    Comment: (NOTE) SARS-CoV-2 target nucleic acids are NOT DETECTED.  The SARS-CoV-2 RNA is generally detectable in upper respiratory specimens during the acute phase of infection. The lowest concentration of SARS-CoV-2 viral copies this assay can detect is 138 copies/mL. A negative result does not preclude SARS-Cov-2 infection and should not be used as the sole basis for treatment or other patient management decisions. A negative result may occur with  improper specimen collection/handling, submission of specimen other than nasopharyngeal swab, presence of viral mutation(s) within the areas targeted by this assay, and inadequate number of viral copies(<138 copies/mL). A negative result must be combined with clinical observations, patient history,  and epidemiological information. The expected result is Negative.  Fact Sheet for Patients:  EntrepreneurPulse.com.au  Fact Sheet for Healthcare Providers:  IncredibleEmployment.be  This test is no t yet approved or cleared by the [place] FDA and  has been authorized for detection and/or diagnosis of SARS-CoV-2 by FDA under an Emergency Use Authorization (EUA).  This EUA will remain  in effect (meaning this test can be used) for the duration of the COVID-19 declaration under Section 564(b)(1) of the Act, 21 U.S.C.section 360bbb-3(b)(1), unless the authorization is terminated  or revoked sooner.       Influenza A by PCR NEGATIVE NEGATIVE   Influenza B by PCR NEGATIVE NEGATIVE    Comment: (NOTE) The Xpert Xpress SARS-CoV-2/FLU/RSV plus assay is intended as an aid in the diagnosis of influenza from Nasopharyngeal swab specimens and should not be used as a sole basis for treatment. Nasal washings and aspirates are unacceptable for Xpert Xpress SARS-CoV-2/FLU/RSV testing.  Fact Sheet for Patients: EntrepreneurPulse.com.au  Fact Sheet for Healthcare Providers: IncredibleEmployment.be  This test is not yet approved or cleared by the [place] FDA and has been authorized for detection and/or diagnosis of SARS-CoV-2 by FDA under an Emergency Use Authorization (EUA). This EUA will remain in effect (meaning this test can be used) for the duration of the COVID-19 declaration under Section 564(b)(1) of the Act, 21 U.S.C. section 360bbb-3(b)(1), unless the authorization is terminated or revoked.     Resp Syncytial Virus by PCR NEGATIVE NEGATIVE    Comment: (NOTE) Fact Sheet for Patients: EntrepreneurPulse.com.au  Fact Sheet for Healthcare Providers: IncredibleEmployment.be  This test is not yet approved or cleared by the [place] FDA and has been authorized for detection and/or diagnosis of SARS-CoV-2 by FDA under an Emergency Use Authorization (EUA). This EUA will remain in effect (meaning this test can be used) for the duration of the COVID-19 declaration under Section 564(b)(1) of the Act, 21 U.S.C. section 360bbb-3(b)(1), unless the authorization is terminated or revoked.  Performed at [facility], [place] [address] [place], [place] [address]    Comprehensive metabolic panel     Status: Abnormal   Collection Time: 05/16/20 10:18 AM  Result Value Ref Range   Sodium 138 135 - 145 mmol/L   Potassium 4.2 3.5 - 5.1 mmol/L   Chloride 101 98 - 111 mmol/L   CO2 28 22 - 32 mmol/L   Glucose, Bld 161 (H) 70 - 99 mg/dL    Comment: Glucose reference range applies only to samples taken after fasting for at least 8 hours.   BUN 9 8 - 23 mg/dL   Creatinine, Ser 1.12 0.61 - 1.24 mg/dL   Calcium 9.3 8.9 - 10.3 mg/dL   Total Protein 7.2 6.5 - 8.1 g/dL   Albumin 3.6 3.5 - 5.0 g/dL   AST 36 15 - 41 U/L   ALT 53 (H) 0 - 44 U/L   Alkaline Phosphatase 111 38 - 126 U/L   Total Bilirubin 0.8 0.3 - 1.2 mg/dL   GFR, Estimated >60 >60 mL/min    Comment: (NOTE) Calculated using the CKD-EPI Creatinine Equation (2021)    Anion gap 9 5 - 15    Comment: Performed at [facility] [address], [place], [place] [address]  CBC with Differential     Status: Abnormal   Collection Time: 05/16/20 10:18 AM  Result Value Ref Range   WBC 14.5 (H) 4.0 - 10.5 K/uL   RBC 4.48 4.22 - 5.81 MIL/uL  Hemoglobin 15.0 13.0 - 17.0 g/dL   HCT 44.7 39.0 - 52.0 %   MCV 99.8 80.0 - 100.0 fL   MCH 33.5 26.0 - 34.0 pg   MCHC 33.6 30.0 - 36.0 g/dL   RDW 12.9 11.5 - 15.5 %   Platelets 195 150 - 400 K/uL   nRBC 0.0 0.0 - 0.2 %   Neutrophils Relative % 81 %   Neutro Abs 11.8 (H) 1.7 - 7.7 K/uL   Lymphocytes Relative 8 %   Lymphs Abs 1.2 0.7 - 4.0 K/uL   Monocytes Relative 7 %   Monocytes Absolute 1.0 0.1 - 1.0 K/uL   Eosinophils Relative 2 %   Eosinophils Absolute 0.3 0.0 - 0.5 K/uL   Basophils Relative 1 %   Basophils Absolute 0.1 0.0 - 0.1 K/uL   Immature Granulocytes 1 %   Abs Immature Granulocytes 0.07 0.00 - 0.07 K/uL    Comment: Performed at [facility] [address] [place], [place] [address]  Procalcitonin - Baseline     Status: None   Collection Time: 05/16/20 10:07 PM  Result Value Ref Range   Procalcitonin <0.10 ng/mL    Comment:         Interpretation: PCT (Procalcitonin) <= 0.5 ng/mL: Systemic infection (sepsis) is not likely. Local bacterial infection is possible. (NOTE)       Sepsis PCT Algorithm           Lower Respiratory Tract                                      Infection PCT Algorithm    ----------------------------     ----------------------------         PCT < 0.25 ng/mL                PCT < 0.10 ng/mL          Strongly encourage             Strongly discourage   discontinuation of antibiotics    initiation of antibiotics    ----------------------------     -----------------------------       PCT 0.25 - 0.50 ng/mL            PCT 0.10 - 0.25 ng/mL               OR       >80% decrease in PCT            Discourage initiation of                                            antibiotics      Encourage discontinuation           of antibiotics    ----------------------------     -----------------------------         PCT >= 0.50 ng/mL              PCT 0.26 - 0.50 ng/mL               AND        <80% decrease in PCT             Encourage initiation of  antibiotics       Encourage continuation           of antibiotics    ----------------------------     -----------------------------        PCT >= 0.50 ng/mL                  PCT > 0.50 ng/mL               AND         increase in PCT                  Strongly encourage                                      initiation of antibiotics    Strongly encourage escalation           of antibiotics                                     -----------------------------                                           PCT <= 0.25 ng/mL                                                 OR                                        > 80% decrease in PCT                                      Discontinue / Do not initiate                                             antibiotics  Performed at [facility], [address] [place], [place] [address]    MRSA PCR Screening     Status: None   Collection Time: 05/16/20 11:54 PM   Specimen: Nasopharyngeal  Result Value Ref Range   MRSA by PCR NEGATIVE NEGATIVE    Comment:        The GeneXpert MRSA Assay (FDA approved for NASAL specimens only), is one component of a comprehensive MRSA colonization surveillance program. It is not intended to diagnose MRSA infection nor to guide or monitor treatment for MRSA infections. Performed at [facility], [place] [address] [place], [place] [address]   Comprehensive metabolic panel     Status: Abnormal   Collection Time: 05/17/20 12:25 AM  Result Value Ref Range   Sodium 139 135 - 145 mmol/L   Potassium 4.1 3.5 - 5.1 mmol/L   Chloride 96 (L) 98 - 111 mmol/L   CO2 31 22 - 32 mmol/L   Glucose, Bld 187 (H)  70 - 99 mg/dL    Comment: Glucose reference range applies only to samples taken after fasting for at least 8 hours.   BUN 12 8 - 23 mg/dL   Creatinine, Ser 1.11 0.61 - 1.24 mg/dL   Calcium 9.1 8.9 - 10.3 mg/dL   Total Protein 6.7 6.5 - 8.1 g/dL   Albumin 3.2 (L) 3.5 - 5.0 g/dL   AST 37 15 - 41 U/L   ALT 52 (H) 0 - 44 U/L   Alkaline Phosphatase 105 38 - 126 U/L   Total Bilirubin 0.6 0.3 - 1.2 mg/dL   GFR, Estimated >60 >60 mL/min    Comment: (NOTE) Calculated using the CKD-EPI Creatinine Equation (2021)    Anion gap 12 5 - 15    Comment: Performed at [facility] [address] [place], [place] [address]  CBC     Status: Abnormal   Collection Time: 05/17/20 12:25 AM  Result Value Ref Range   WBC 10.9 (H) 4.0 - 10.5 K/uL   RBC 4.43 4.22 - 5.81 MIL/uL   Hemoglobin 15.1 13.0 - 17.0 g/dL   HCT 43.5 39.0 - 52.0 %   MCV 98.2 80.0 - 100.0 fL   MCH 34.1 (H) 26.0 - 34.0 pg   MCHC 34.7 30.0 - 36.0 g/dL   RDW 12.8 11.5 - 15.5 %   Platelets 173 150 - 400 K/uL   nRBC 0.0 0.0 - 0.2 %    Comment: Performed at [facility], [place] [address] [place], [place] [address]  Protime-INR     Status: None   Collection Time: 05/17/20  12:25 AM  Result Value Ref Range   Prothrombin Time 13.2 11.4 - 15.2 seconds   INR 1.0 0.8 - 1.2    Comment: (NOTE) INR goal varies based on device and disease states. Performed at [facility], [place] [address], [place], [place] [address]   Protime-INR     Status: None   Collection Time: 05/17/20  2:59 AM  Result Value Ref Range   Prothrombin Time 13.3 11.4 - 15.2 seconds   INR 1.1 0.8 - 1.2    Comment: (NOTE) INR goal varies based on device and disease states. Performed at [facility], [place] [address] [place], [place] [address]     DG Chest 2 View  Result Date: 05/16/2020 CLINICAL DATA:  Shortness of breath. EXAM: CHEST - 2 VIEW COMPARISON:  03/03/2020 FINDINGS: Diffuse interstitial lung disease again noted with patchy areas of airspace opacity in the lower lungs bilaterally, similar to prior but slightly more confluent today. Cardiopericardial silhouette is at upper limits of normal for size. Right Port-A-Cath again noted. IMPRESSION: Diffuse interstitial lung disease with some more confluent patchy opacity in the lower lungs bilaterally, increased since prior chest x-ray. Electronically Signed   By: Misty Stanley M.D.   On: 05/16/2020 10:56  CT ANGIOGRAPHY CHEST WITH CONTRAST  TECHNIQUE: Multidetector CT imaging of the chest was performed using the standard protocol during bolus administration of intravenous contrast. Multiplanar CT image reconstructions and MIPs were obtained to evaluate the vascular anatomy.  CONTRAST:  68m OMNIPAQUE IOHEXOL 350 MG/ML SOLN  COMPARISON:  March 02, 2020.  FINDINGS: Cardiovascular: Satisfactory opacification of the pulmonary arteries to the segmental level. No evidence of pulmonary embolism. Normal heart size. No pericardial effusion. Status post coronary bypass graft. Atherosclerosis of thoracic aorta is noted without aneurysm formation.  Mediastinum/Nodes: No enlarged mediastinal, hilar, or axillary lymph nodes.  Thyroid gland,  trachea, and esophagus demonstrate no significant findings.  Lungs/Pleura: No pneumothorax or pleural effusion is noted. Bilateral patchy airspace opacities are noted which are slightly increased compared to prior exam, concerning for multifocal pneumonia or potentially post infectious scarring from previous infection. Some degree of bronchiectasis is also noted in both lower lobes.  Upper Abdomen: No acute abnormality.  Musculoskeletal: No chest wall abnormality. No acute or significant osseous findings.  Review of the MIP images confirms the above findings.  IMPRESSION: 1. No definite evidence of pulmonary embolus. 2. Bilateral patchy airspace opacities are noted which are slightly increased compared to prior exam, concerning for multifocal pneumonia or potentially post infectious scarring from previous infection. Some degree of bronchiectasis is also noted in both lower lobes. 3. Aortic atherosclerosis.  Aortic Atherosclerosis (ICD10-I70.0).   Electronically Signed   By: Marijo Conception M.D.   On: 05/15/2020 12:18  Review of Systems  Constitutional: Positive for activity change and fatigue. Negative for chills and fever.  Respiratory: Positive for shortness of breath. Negative for wheezing and stridor.   Cardiovascular: Negative for chest pain.   Blood pressure 129/79, pulse 65, temperature 98.2 F (36.8 C), temperature source Oral, resp. rate 19, height _0  (1.727 m), weight 96.4 kg, SpO2 94 %. Physical Exam Vitals reviewed.  Constitutional:      Appearance: Normal appearance.  HENT:     Head: Normocephalic and atraumatic.  Eyes:     Extraocular Movements: Extraocular movements intact.  Cardiovascular:     Rate and Rhythm: Normal rate and regular rhythm.     Heart sounds: No murmur heard.   Pulmonary:     Effort: No respiratory distress.     Breath sounds: Rales present. No wheezing.  Abdominal:     General: There is no distension.      Palpations: Abdomen is soft.  Musculoskeletal:     Cervical back: Neck supple.     Right lower leg: No edema.     Left lower leg: No edema.  Lymphadenopathy:     Cervical: No cervical adenopathy.  Skin:    General: Skin is warm and dry.  Neurological:     General: No focal deficit present.     Mental Status: He is alert and oriented to person, place, and time.     Cranial Nerves: No cranial nerve deficit.     Motor: No weakness.     Assessment/Plan: 75 yo man with a past history of of rectal carcinoma, hypertension, hyperlipidemia and CAD (CABG in 2010). Probably has some form of underlying mild ILD. Presents with 3 months of progressive dyspnea. CT chest shows "Bilateral patchy airspace opacities are noted which are slightly increased compared to prior exam, concerning for multifocal pneumonia or potentially post infectious scarring from previous infection."  Discussed with Dr. Loanne Drilling of [facility]. She is planning bronchoscopy. No indication for surgical biopsy at this time  We will be available should surgical biopsy be needed.  Melrose Nakayama 05/17/2020, 6:01 PM

## 2020-05-17 NOTE — H&P (Addendum)
Date: 05/17/2020               Patient Name:  Garrett Peterson MRN: [id number]  DOB: 1946-02-03 Age / Sex: 75 y.o., male   PCP: Albina Billet, MD         Medical Service: Internal Medicine Teaching Service         Attending Physician: Dr. Evette Doffing, Mallie Mussel, *    First Contact: Dr. Gilberto Better Pager: [phone]  Second Contact: Dr. 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  Patient was positive for Covid in October and admitted to the hospital 3 weeks after for worsening shortness of breath. He was given Z-Pak and steroid at discharge which did not relieve his symptoms. He was then referred to pulmonology in [facility] Dr. Lanney Gins who performed a bronchoscopy on 12/13/121.  Bronchoalveolar lavage study showed negative aerobic culture, aspergillosis testing, and acid-fast smear/culture.  Cytology was negative for atypia.  Patient was taking prednisone for 2 months and also started on Bactrim last week.  CT chest 02/2020 shows extensive groundglass appearance throughout.  CTA done yesterday was negative for PE and showed bilateral patchy opacities concerning for multifocal pneumonia versus postinfectious scarring.  Aortic atherosclerosis also seen.  Doppler ultrasound was negative for DVT bilaterally.  Patient states that he is short of breath with exertion and bent forward. Better with rest.  He denies chest pain with exertion or orthopnea.  He endorses LE edema.  Patient reports that he has had echocardiogram done in the past and results was normal.  No echo results  found in the chart.  Patient was diagnosed with rectal cancer in June, has had resection surgery and started on chemotherapy with FOLFOX in July.  Currently stopping chemotherapy given pulmonary issue.  Meds:  Current Meds  Medication Sig  . metoprolol tartrate (LOPRESSOR) 25 MG tablet Take 25 mg by mouth 2 (two) times daily.  . predniSONE (DELTASONE) 20 MG tablet Take 20 mg by mouth daily with breakfast.  . temazepam (RESTORIL) 30 MG capsule Take 30 mg by mouth at bedtime.      Allergies: Allergies as of 05/16/2020  . (No Known Allergies)   Past Medical History:  Diagnosis Date  . Actinic keratosis   . Adenomatous polyp of colon   . Arthritis   . Basal cell carcinoma   . CAD (coronary artery disease)   . Cancer ([place]) 2021   rectal  . Hyperlipidemia   . Pneumonia   . Pre-diabetes   . Respiratory failure with hypoxia ([facility])     Family History:  Father died of lung disease, contributed TNT toxicity  Social History:  Drinks 1 pint of liquor a week Never smoked Denies recreational drug use  Review of Systems: A complete ROS was negative except as per HPI.   Physical Exam: Blood pressure 130/63, pulse 77, temperature 97.7 F (36.5 C), temperature source Oral, resp. rate 17, height _0  (1.727 m), weight 96.4 kg, SpO2 98 %.  Physical Exam Constitutional:      General: He is not  in acute distress. HENT:     Head: Normocephalic.  Eyes:     General:        Right eye: No discharge.        Left eye: No discharge.  Cardiovascular:     Rate and Rhythm: Normal rate and regular rhythm.  Pulmonary:     Comments: Rhonchi bilaterally Abdominal:     General: Bowel sounds are normal.     Palpations: Abdomen is soft.     Tenderness: There is no abdominal tenderness.  Musculoskeletal:     Right lower leg: Edema (Trace) present.     Left lower leg: Edema (Trace) present.  Skin:    General: Skin is warm.  Neurological:     Mental Status: He is alert.  Psychiatric:         Mood and Affect: Mood normal.     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  Respiratory failure Suspected Interstitial lung disease 2/2 chemotherapy Patient has been having worsening shortness of breath in the last 2 months.  Has seen pulmonology and had bronchoscopy done, which showed negative aerobic culture, aspergillosis, and acid-fast smears/culture.  Cytology was negative for atypia. Low suspicion for infectious cause given all negative test and not responded to antibiotics. procal < 0.1.  Covid and flu PCR were negative. [name] is pulmonary toxicity secondary to FOLFOX chemotherapy, especially oxaliplatin.  Chemotherapy is being held. Patient also had Covid in October. Post Covid pulmonary complications such as lung scarring can also contribute to his short of breath. Pulmonology and cardiothoracic surgeon were consulted and will see the patient in the morning.  Possible lung biopsy? -Continue prednisone 20 mg daily -Continue Bactrim -N.p.o. after midnight   Hypertension Continue metoprolol tartrate 25 mg twice daily   Abnormal LFT ALT mildly elevated at 52.  AST, total bili and alk phos were within normal limits.  Per chart, patient started to have abnormal LFTs since July 2021.  Could be a side effect of FOLFOX. -Monitor LFT   Code: Full VTE: SCDs  Dispo: Admit patient to Inpatient with expected length of stay greater  than 2 midnights.    Signed: Gaylan Gerold, DO 05/17/2020, 1:53 AM  Pager: [phone] After 5pm on weekdays and 1pm on weekends: On Call pager: [phone]

## 2020-05-17 NOTE — Telephone Encounter (Signed)
Hi Rahul, is anything needed from outpatient office at this time? I see patient is still admitted, and nothing was documented on encounter notes sent to triage.  Thanks!

## 2020-05-17 NOTE — Progress Notes (Signed)
Subjective:  Garrett Peterson is a 75 y.o. 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 States his breathing is not good. He feels pretty well at rest, but states he can walk only 7-8 steps before feeling short of breath. Denies cough. Has soreness to his central chest. Good appetite. Notes sweating one night after starting a new medication, which has since improved. Is on prednisone 55m at home, could not tell any difference in symptoms after starting this. He is on 4L at home, for the past 3-4 weeks. Prior to that did not require O2 at home. This issue started pretty suddenly upon waking about 2 months ago. It was intermittent at first and became more frequent over time.  Denies any acute events bringing him to the hospital, but states that his pulmonologist has been treating him with medications outpatient and has not felt any improvement. Notes recent course of Abx and CTA which was negative for PE. States he had a "partial biopsy" that "came back okay."  Objective:  Vital signs in last 24 hours: Vitals:   05/16/20 1625 05/16/20 1946 05/16/20 2355 05/17/20 0000  BP: (!) 169/83 (!) 183/75 (!) 160/95 130/63  Pulse: 74 98 77   Resp: _0 Temp: 98.3 F (36.8 C) 97.9 F (36.6 C) 97.7 F (36.5 C)   TempSrc: Oral Oral Oral   SpO2: 96% 99% 98%   Weight:   96.4 kg   Height:   _1  (1.727 m)    Gen: Well-developed, well nourished, chronically ill-appearing, NAD HEENT: NCAT head, hearing intact, EOMI Neck: supple, ROM intact, no JVD CV: RRR, S1, S2 normal, No rubs, no murmurs, no gallops Pulm: Crackles on bilateral lung fields, most prominent at the bases Extm: ROM intact, Peripheral pulses intact, No peripheral edema Skin: Dry, Warm, normal turgor, no obvious rash, post-surgical scar on chest Neuro: AAOx3 Psych: Normal mood and affect  Assessment/Plan:  Principal  Problem:   Respiratory failure (HCC) Active Problems:   HTN (hypertension)  [name] a 75y.o. 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  Acute on chronic hypoxic respiratory failure Suspected interstitial lung disease 2/2 chemo/radiation side effect vs post-COVID sequelae vs atypical infection Present w/ worsening dyspnea on exertion after failing outpatient therapy with steroids and antibiotics. Prior work-up include broncho with work-up for infectious etiology as well as rheumatologic antibodies. Does have hx of FOLFOX chemotherapy for rectal carcinoma as well as COVID pneumonia in October. Pulm on board. Currently at 4L satting 96%. Afebrile overnight. Vitals stable. Chest CT with bilateral patchy opacities with bronchiectasis. Pro-calcitonin <0.1 - Appreciate pulm recs - Continous pulse ox - O2 as needed to keep sat >92 - C/w home meds: prednisone 264mdaily, Bactrim 400-8050mID  Rectal Carcinoma s/p radiation, resection, chemo Diagnosed in 09/2019 as pT3 pN1 M0 grade 2 adenocarcinoma s/p resection. Started on adjuvant chemo treatment in 6/202 with FOLFOX. Per last Onc note, currently in remission with repeat CT/abd/pelvi showing no evidence of recurrence. - Monitor  CAD s/p CABG - C/w home med: lopressor 32m54mD  DVT prophx: lovenox Diet: Regular Bowel: Miralax Code: Full  Prior to Admission Living Arrangement: Home  Anticipated Discharge Location: Home Barriers to Discharge: Medical work-up Dispo: Anticipated discharge in  approximately 1-2 day(s).   [name], MD 05/17/2020, 6:37 AM Pager: [phone] After 5pm on weekdays and 1pm on weekends: On Call Pager: [phone]

## 2020-05-18 ENCOUNTER — Encounter (HOSPITAL_COMMUNITY)
Admission: EM | Disposition: A | Discharge status: Home / Self Care | Payer: Self-pay | Source: Ambulatory Visit | Attending: Student in an Organized Health Care Education/Training Program

## 2020-05-18 ENCOUNTER — Inpatient Hospital Stay (HOSPITAL_COMMUNITY): Payer: Medicare Other | Admitting: Certified Registered Nurse Anesthetist

## 2020-05-18 ENCOUNTER — Encounter (HOSPITAL_COMMUNITY): Payer: Self-pay | Admitting: Student in an Organized Health Care Education/Training Program

## 2020-05-18 ENCOUNTER — Inpatient Hospital Stay (HOSPITAL_COMMUNITY): Payer: Medicare Other

## 2020-05-18 DIAGNOSIS — I361 Nonrheumatic tricuspid (valve) insufficiency: Secondary | ICD-10-CM

## 2020-05-18 DIAGNOSIS — R0602 Shortness of breath: Secondary | ICD-10-CM | POA: Diagnosis not present

## 2020-05-18 DIAGNOSIS — J9611 Chronic respiratory failure with hypoxia: Secondary | ICD-10-CM | POA: Diagnosis not present

## 2020-05-18 DIAGNOSIS — I340 Nonrheumatic mitral (valve) insufficiency: Secondary | ICD-10-CM | POA: Diagnosis not present

## 2020-05-18 HISTORY — PX: BRONCHIAL WASHINGS: SHX5105

## 2020-05-18 HISTORY — PX: VIDEO BRONCHOSCOPY: SHX5072

## 2020-05-18 LAB — BODY FLUID CELL COUNT WITH DIFFERENTIAL
Eos, Fluid: 4 %
Lymphs, Fluid: 29 %
Monocyte-Macrophage-Serous Fluid: 32 % — ABNORMAL LOW (ref 50–90)
Neutrophil Count, Fluid: 35 % — ABNORMAL HIGH (ref 0–25)
Total Nucleated Cell Count, Fluid: 88 cu mm (ref 0–1000)

## 2020-05-18 LAB — COMPREHENSIVE METABOLIC PANEL
ALT: 48 U/L — ABNORMAL HIGH (ref 0–44)
AST: 32 U/L (ref 15–41)
Albumin: 3.4 g/dL — ABNORMAL LOW (ref 3.5–5.0)
Alkaline Phosphatase: 89 U/L (ref 38–126)
Anion gap: 9 (ref 5–15)
BUN: 11 mg/dL (ref 8–23)
CO2: 28 mmol/L (ref 22–32)
Calcium: 8.9 mg/dL (ref 8.9–10.3)
Chloride: 101 mmol/L (ref 98–111)
Creatinine, Ser: 1.08 mg/dL (ref 0.61–1.24)
GFR, Estimated: >60 mL/min (ref >60)
Glucose, Bld: 127 mg/dL — ABNORMAL HIGH (ref 70–99)
Potassium: 3.8 mmol/L (ref 3.5–5.1)
Sodium: 138 mmol/L (ref 135–145)
Total Bilirubin: 1.3 mg/dL — ABNORMAL HIGH (ref 0.3–1.2)
Total Protein: 6.9 g/dL (ref 6.5–8.1)

## 2020-05-18 LAB — ECHOCARDIOGRAM COMPLETE BUBBLE STUDY
Area-P 1/2: 3.65 cm2
Calc EF: 66 %
S' Lateral: 2.5 cm
Single Plane A2C EF: 65 %
Single Plane A4C EF: 66.3 %

## 2020-05-18 LAB — CBC
HCT: 43.7 % (ref 39.0–52.0)
Hemoglobin: 15 g/dL (ref 13.0–17.0)
MCH: 33.9 pg (ref 26.0–34.0)
MCHC: 34.3 g/dL (ref 30.0–36.0)
MCV: 98.6 fL (ref 80.0–100.0)
Platelets: 166 10*3/uL (ref 150–400)
RBC: 4.43 MIL/uL (ref 4.22–5.81)
RDW: 12.9 % (ref 11.5–15.5)
WBC: 8.2 10*3/uL (ref 4.0–10.5)
nRBC: 0 % (ref 0.0–0.2)

## 2020-05-18 LAB — SEDIMENTATION RATE: Sed Rate: 25 mm/hr — ABNORMAL HIGH (ref 0–16)

## 2020-05-18 LAB — C-REACTIVE PROTEIN: CRP: <0.5 mg/dL (ref <1.0)

## 2020-05-18 SURGERY — VIDEO BRONCHOSCOPY WITHOUT FLUORO
Anesthesia: General

## 2020-05-18 MED ORDER — ONDANSETRON HCL 4 MG/2ML IJ SOLN
INTRAMUSCULAR | Status: DC | PRN
  Administered 2020-05-18: 4 mg via INTRAVENOUS

## 2020-05-18 MED ORDER — LIDOCAINE 2% (20 MG/ML) 5 ML SYRINGE
INTRAMUSCULAR | Status: DC | PRN
  Administered 2020-05-18: 40 mg via INTRAVENOUS

## 2020-05-18 MED ORDER — EPHEDRINE SULFATE-NACL 50-0.9 MG/10ML-% IV SOSY
PREFILLED_SYRINGE | INTRAVENOUS | Status: DC | PRN
  Administered 2020-05-18: 10 mg via INTRAVENOUS

## 2020-05-18 MED ORDER — SODIUM CHLORIDE 0.9 % IV SOLN: INTRAVENOUS | Status: DC | PRN

## 2020-05-18 MED ORDER — FENTANYL CITRATE (PF) 250 MCG/5ML IJ SOLN
INTRAMUSCULAR | Status: DC | PRN
  Administered 2020-05-18 (×2): 50 ug via INTRAVENOUS

## 2020-05-18 MED ORDER — PROPOFOL 10 MG/ML IV BOLUS
INTRAVENOUS | Status: DC | PRN
  Administered 2020-05-18: 140 mg via INTRAVENOUS

## 2020-05-18 MED ORDER — LIDOCAINE HCL (PF) 1 % IJ SOLN
INTRAMUSCULAR | Status: AC
  Filled 2020-05-18: qty 30

## 2020-05-18 MED ORDER — SUGAMMADEX SODIUM 200 MG/2ML IV SOLN
INTRAVENOUS | Status: DC | PRN
  Administered 2020-05-18: 50 mg via INTRAVENOUS
  Administered 2020-05-18: 200 mg via INTRAVENOUS

## 2020-05-18 MED ORDER — ENOXAPARIN SODIUM 40 MG/0.4ML ~~LOC~~ SOLN
40.0000 mg | SUBCUTANEOUS | Status: DC
  Administered 2020-05-18: 40 mg via SUBCUTANEOUS
  Filled 2020-05-18: qty 0.4

## 2020-05-18 MED ORDER — ROCURONIUM BROMIDE 100 MG/10ML IV SOLN
INTRAVENOUS | Status: DC | PRN
  Administered 2020-05-18: 40 mg via INTRAVENOUS

## 2020-05-18 NOTE — Anesthesia Preprocedure Evaluation (Addendum)
Anesthesia Evaluation  Patient identified by MRN, date of birth, ID band Patient awake    Reviewed: Allergy & Precautions, NPO status , Patient's Chart, lab work & pertinent test results  Airway Mallampati: II  TM Distance: >3 FB Neck ROM: Full    Dental  (+) Teeth Intact, Dental Advisory Given   Pulmonary    breath sounds clear to auscultation       Cardiovascular hypertension, Pt. on home beta blockers + CAD and + CABG   Rhythm:Regular Rate:Normal     Neuro/Psych negative neurological ROS  negative psych ROS   GI/Hepatic negative GI ROS, Neg liver ROS,   Endo/Other  negative endocrine ROS  Renal/GU negative Renal ROS     Musculoskeletal  (+) Arthritis ,   Abdominal Normal abdominal exam  (+)   Peds  Hematology negative hematology ROS (+)   Anesthesia Other Findings - HLD  Reproductive/Obstetrics                            Anesthesia Physical Anesthesia Plan  ASA: III  Anesthesia Plan: General   Post-op Pain Management:    Induction: Intravenous  PONV Risk Score and Plan: 2 and Ondansetron and Treatment may vary due to age or medical condition  Airway Management Planned: Oral ETT  Additional Equipment: None  Intra-op Plan:   Post-operative Plan: Extubation in OR  Informed Consent: I have reviewed the patients History and Physical, chart, labs and discussed the procedure including the risks, benefits and alternatives for the proposed anesthesia with the patient or authorized representative who has indicated his/her understanding and acceptance.     Dental advisory given  Plan Discussed with: CRNA  Anesthesia Plan Comments:        Anesthesia Quick Evaluation

## 2020-05-18 NOTE — Op Note (Signed)
[facility] Cardiopulmonary Patient Name: Garrett Peterson Date: 05/18/2020 MRN: [id number] Attending MD: Rodman Pickle , MD Date of Birth: [birth year] CSN: Finalized Age: 75 Admit Type: Inpatient Gender: Male Procedure:             Bronchoscopy Indications:           Immune compromised with pneumonia Providers:             Rodman Pickle, MD, Erenest Rasher, RN, Darlene H. Rosana Hoes,                         Technician Referring MD:           Medicines:             General Anesthesia Complications:         No immediate complications Estimated Blood Loss:  Estimated blood loss: none. Procedure:             Pre-Anesthesia Assessment:                        - Please see anesthesia report                        After obtaining informed consent, the bronchoscope was                         passed under direct vision. Throughout the procedure,                         the patient's blood pressure, pulse, and oxygen                         saturations were monitored continuously. the                         [place] [id number] was introduced through the                         mouth, via the endotracheal tube and advanced to the                         tracheobronchial tree. The procedure was accomplished                         without difficulty. Scope In: Scope Out: Findings:      The endotracheal tube is in good position. The visualized portion of the       trachea is of normal caliber. The carina is sharp. The tracheobronchial       tree was examined to at least the first subsegmental level. Bronchial       mucosa and anatomy are normal; Scattered thick white secretions present.       There are no endobronchial lesions.      Bronchoalveolar lavage was performed in the RLL superior segment (B6) of       the lung and sent for cell count, bacterial culture, viral smears &       culture, fungal & AFB analysis and cytology for immunocompromised host       protocol. 120  mL of fluid were instilled. 35 mL were returned. The       return was  cloudy. There were no mucoid plugs in the return fluid. Impression:            - Immune compromised with pneumonia                        - The airway examination was normal.                        - Bronchoalveolar lavage was performed. Moderate Sedation:      General anesthesia Recommendation:        - Await BAL results. Procedure Code(s):     --- Professional ---                        [phone], Bronchoscopy, rigid or flexible, including                         fluoroscopic guidance, when performed; with bronchial                         alveolar lavage Diagnosis Code(s):     --- Professional ---                        J18.9, Pneumonia, unspecified organism CPT copyright 2019 American Medical Association. All rights reserved. The codes documented in this report are preliminary and upon coder review may  be revised to meet current compliance requirements. Rodman Pickle, MD 05/18/2020 10:10:15 AM This report has been signed electronically. Number of Addenda: 0

## 2020-05-18 NOTE — Anesthesia Procedure Notes (Signed)
Procedure Name: Intubation Date/Time: 05/18/2020 9:41 AM Performed by: Janene Harvey, CRNA Pre-anesthesia Checklist: Patient identified, Emergency Drugs available, Suction available and Patient being monitored Patient Re-evaluated:Patient Re-evaluated prior to induction Oxygen Delivery Method: Circle system utilized Preoxygenation: Pre-oxygenation with 100% oxygen Induction Type: IV induction Ventilation: Mask ventilation without difficulty Laryngoscope Size: Mac and 4 Grade View: Grade II Tube type: Oral Tube size: 8.5 mm Number of attempts: 1 Airway Equipment and Method: Stylet and Oral airway Placement Confirmation: ETT inserted through vocal cords under direct vision,  positive ETCO2 and breath sounds checked- equal and bilateral Secured at: 23 cm Tube secured with: Tape Dental Injury: Teeth and Oropharynx as per pre-operative assessment

## 2020-05-18 NOTE — Plan of Care (Signed)
  Problem: Education: Goal: Knowledge of General Education information will improve Description: Including pain rating scale, medication(s)/side effects and non-pharmacologic comfort measures Outcome: Progressing   Problem: Health Behavior/Discharge Planning: Goal: Ability to manage health-related needs will improve Outcome: Progressing   Problem: Clinical Measurements: Goal: Ability to maintain clinical measurements within normal limits will improve Outcome: Progressing   Problem: Clinical Measurements: Goal: Respiratory complications will improve Outcome: Progressing   Problem: Activity: Goal: Risk for activity intolerance will decrease Outcome: Progressing   Problem: Nutrition: Goal: Adequate nutrition will be maintained Outcome: Progressing   Problem: Pain Managment: Goal: General experience of comfort will improve Outcome: Progressing

## 2020-05-18 NOTE — Anesthesia Postprocedure Evaluation (Signed)
Anesthesia Post Note  Patient: Garrett Peterson  Procedure(s) Performed: VIDEO BRONCHOSCOPY WITHOUT FLUORO (N/A ) BRONCHIAL WASHINGS     Patient location during evaluation: PACU Anesthesia Type: General Level of consciousness: awake and alert Pain management: pain level controlled Vital Signs Assessment: post-procedure vital signs reviewed and stable Respiratory status: spontaneous breathing, nonlabored ventilation, respiratory function stable and patient connected to nasal cannula oxygen Cardiovascular status: blood pressure returned to baseline and stable Postop Assessment: no apparent nausea or vomiting Anesthetic complications: no   No complications documented.  Last Vitals:  Vitals:   05/18/20 1020 05/18/20 1030  BP: (!) 120/53 140/61  Pulse: 66 65  Resp: 20 (!) 25  Temp:    SpO2: 91% 96%    Last Pain:  Vitals:   05/18/20 1030  TempSrc:   PainSc: 0-No pain                 Effie Berkshire

## 2020-05-18 NOTE — Transfer of Care (Signed)
Immediate Anesthesia Transfer of Care Note  Patient: Garrett Peterson  Procedure(s) Performed: VIDEO BRONCHOSCOPY WITHOUT FLUORO (N/A ) BRONCHIAL WASHINGS  Patient Location: Endoscopy Unit  Anesthesia Type:General  Level of Consciousness: drowsy and patient cooperative  Airway & Oxygen Therapy: Patient Spontanous Breathing and Patient connected to face mask oxygen  Post-op Assessment: Report given to RN and Post -op Vital signs reviewed and stable  Post vital signs: Reviewed  Last Vitals:  Vitals Value Taken Time  BP 145/71 05/18/20 1011  Temp    Pulse 64 05/18/20 1012  Resp 20 05/18/20 1012  SpO2 99 % 05/18/20 1012  Vitals shown include unvalidated device data.  Last Pain:  Vitals:   05/18/20 0825  TempSrc: Tympanic  PainSc: 0-No pain         Complications: No complications documented.

## 2020-05-18 NOTE — Progress Notes (Addendum)
Subjective:   [name] states he is feeling okay after his bronchoscopy and did not experience any complications. He denies SOB at this time.   His grandson at the bedside. He notes some concern that his grandfather does not understand the severity of his lung disease, as he continues to take off his oxygen when ambulating. His grandson noticed his oxygen saturation dropped to the high 70s when Mr. Staffa walked to the bathroom and back without oxygen.   Objective:  Vital signs in last 24 hours: Vitals:   05/17/20 1943 05/17/20 2316 05/18/20 0347 05/18/20 0406  BP: 137/71 134/75 121/72   Pulse: 67 (!) 58 (!) 56   Resp: _0 Temp: 98.3 F (36.8 C) 98.7 F (37.1 C) 98.1 F (36.7 C)   TempSrc: Oral Oral Oral   SpO2: 97% 98% 99%   Weight:    96.7 kg  Height:       Physical Exam Vitals and nursing note reviewed.  Constitutional:      General: He is not in acute distress.    Appearance: He is normal weight.  HENT:     Head: Normocephalic and atraumatic.  Cardiovascular:     Rate and Rhythm: Normal rate and regular rhythm.     Heart sounds: No murmur heard.   Pulmonary:     Effort: Pulmonary effort is normal. No respiratory distress.     Breath sounds: Rales (fine crackles heard in bilateral bases) present. No wheezing.  Abdominal:     General: Bowel sounds are normal.     Palpations: Abdomen is soft.  Skin:    General: Skin is warm and dry.  Neurological:     General: No focal deficit present.     Mental Status: He is alert and oriented to person, place, and time. Mental status is at baseline.  Psychiatric:        Mood and Affect: Mood normal.        Behavior: Behavior normal.    Assessment/Plan:  Principal Problem:   Chronic hypoxemic respiratory failure (HCC) Active Problems:   Rectal cancer (HCC)   HTN (hypertension)  Garrett Peterson is a 75 y.o. 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  # Chronic hypoxic respiratory failure Suspected pre-existing ILD with exacerbation 2/2 chemo/radiation vs post-COVID sequelae vs atypical infection. Cell count from BAL this morning shows only 4% eosinophil, ruling out acute eosinophilic pneumonitis. Patient's oxygen requirement is 2L currently, which is an improvement compared to baseline. Will wait on cultures prior to consideration of high dose steroids.    - Pulmonology following; appreciate their recommendations  - Bronchoscopy today with cell count, cultures and cytology; results pending  - Continous pulse ox - Supplemental oxygen to maintain saturation > 92% - Continue home meds: Prednisone 3m daily, Bactrim 400-873mBID  # Rectal Carcinoma s/p radiation, resection, chemo Diagnosed in 09/2019 as pT3 pN1 M0 grade 2 adenocarcinoma s/p resection. Started on adjuvant chemo treatment in 6/202 with FOLFOX. Per last Onc note, currently in remission with repeat CT/abd/pelvi showing no evidence of recurrence.   - Monitor  # CAD s/p CABG - Continue home med: Lopressor 2536mID  DVT prophx: lovenox Diet: Regular Bowel: Miralax Code: Full  Prior to Admission Living Arrangement: Home  Anticipated Discharge Location: Home Barriers to Discharge: Medical work-up Dispo: Anticipated discharge in approximately 1-2 day(s).  Dr. Jose Persia Internal Medicine PGY-2  Pager: [phone] After 5pm on weekdays and 1pm on weekends: On Call pager [phone]  05/18/2020, 6:05 AM    Internal Medicine Attending:   I saw and examined the patient. I reviewed the resident's note and I agree with the resident's findings and plan as documented in the resident's note.  Hospital day #2 for this 75 year old person with a chronic hypoxic respiratory failure due to new possible interstitial lung disease.  Patient is doing well after bronchoscopy today, there has been no worsening in his respiratory status.  Plan is to  keep the patient admitted while we wait for the bronchoscopy cultures, if an atypical infection is identified we will treat that.  If cultures are clear we will probably pursue high-dose steroids.  Greatly appreciate Dr. Cordelia Pen consultation.  Other medical issues are stable.  He is in good spirits.  We updated his grandson at the bedside.  Xarelto for DVT prophylaxis.  Anticipate discharge back to home in a few days.  Lalla Brothers, MD

## 2020-05-18 NOTE — Progress Notes (Signed)
  Echocardiogram 2D Echocardiogram has been performed.  Garrett Peterson 05/18/2020, 3:09 PM

## 2020-05-18 NOTE — Plan of Care (Signed)

## 2020-05-19 DIAGNOSIS — J9611 Chronic respiratory failure with hypoxia: Secondary | ICD-10-CM | POA: Diagnosis not present

## 2020-05-19 MED ORDER — PREDNISONE 10 MG PO TABS: 60.0000 mg | ORAL_TABLET | Freq: Every day | ORAL | Status: DC

## 2020-05-19 MED ORDER — PREDNISONE 20 MG PO TABS: 60.0000 mg | ORAL_TABLET | Freq: Every day | ORAL | 0 refills | Status: DC

## 2020-05-19 MED ORDER — HEPARIN SOD (PORK) LOCK FLUSH 100 UNIT/ML IV SOLN
500.0000 [IU] | INTRAVENOUS | Status: AC | PRN
  Administered 2020-05-19: 500 [IU]
  Filled 2020-05-19: qty 5

## 2020-05-19 MED ORDER — PREDNISONE 20 MG PO TABS
40.0000 mg | ORAL_TABLET | Freq: Once | ORAL | Status: AC
  Administered 2020-05-19: 40 mg via ORAL
  Filled 2020-05-19: qty 2

## 2020-05-19 MED ORDER — SULFAMETHOXAZOLE-TRIMETHOPRIM 400-80 MG PO TABS: 1.0000 | ORAL_TABLET | Freq: Two times a day (BID) | ORAL | 0 refills | Status: DC

## 2020-05-19 NOTE — Consult Note (Addendum)
NAME:  Garrett Peterson, MRN:  [id number], DOB:  12-Apr-1946, LOS: 3 ADMISSION DATE:  05/16/2020, CONSULTATION DATE:  05/17/2020 REFERRING MD:  Dr. Vernia Buff CHIEF COMPLAINT:  Hypoxia   Brief History:  [name] is a 75 y/o gentleman with a PMHx of rectal carcinoma s/p resection on FOLFOX chemotherapy with progressively worsening DOE and SOB, sent to the ED by his pulmonologist for evaluation of chronic hypoxic respiratory failure.   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  Patient was positive for Covid in October and admitted to the hospital 3 weeks after for worsening shortness of breath. He was given Z-Pak and steroid at discharge which did not relieve his symptoms. He was then referred to pulmonology in [facility] Dr. Lanney Gins who performed a bronchoscopy on 12/13/121.  Bronchoalveolar lavage study showed negative aerobic culture, aspergillosis testing, and acid-fast smear/culture.  Cytology was negative for atypia.  Patient was taking prednisone for 2 months and also started on Bactrim last week.  CT chest 02/2020 shows extensive groundglass appearance throughout.  CTA done yesterday was negative for PE and showed bilateral patchy opacities concerning for multifocal pneumonia versus postinfectious scarring.  Aortic atherosclerosis also seen.  Doppler ultrasound was negative for DVT bilaterally.  Patient states that he is short of breath with exertion and bent forward. Better with rest.  He denies chest pain with exertion or orthopnea.  He endorses LE edema.  Patient reports that he has had echocardiogram done in the past and results was normal.  No echo results found in the chart.  Patient was diagnosed with rectal cancer in June, has had resection  surgery and started on chemotherapy with FOLFOX in July.  Currently stopping chemotherapy given pulmonary issue.  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  Did not work on the line, but had regular exposure -Denies any exposure to birds -Denies any exposure to Health visitor   Past Medical History:  Rectal carcinoma s/p resection receiving chemotherapy and radiation, chronic hypoxic respiratory failure, HTN, HLD, CAD s/p CABG, pre-diabetes   Significant Hospital Events:  01/05 >> Admitted to [facility]  Consults:  TCCM  Procedures:  None  Significant Diagnostic Tests:   CTA (01/05):  1. No definite evidence of pulmonary embolus. 2. Bilateral patchy airspace opacities are noted which are slightly increased compared to prior exam, concerning for multifocal pneumonia or potentially post infectious scarring from previous infection. Some degree of bronchiectasis is also noted in both lower lobes. 3. Aortic atherosclerosis.  Micro Data:  None   Antimicrobials:  None    Interim History / Subjective:   S/p bronchoscopy. Reports unchanged dyspnea on exertion. Sitting in chair  Objective   Blood pressure 136/74, pulse 68, temperature 97.7 F (36.5 C), temperature source Oral, resp. rate 20, height _0  (1.727 m), weight 97.5 kg, SpO2 96 %.    FiO2 (%):  [99 %] 99 %   Intake/Output Summary (Last 24 hours) at 05/19/2020 0849 Last data filed at 05/19/2020 0620 Gross per 24 hour  Intake 590 ml  Output 405 ml  Net 185 ml   Filed Weights   05/16/20 2355 05/18/20 0406 05/19/20 0337  Weight: 96.4 kg 96.7  kg 97.5 kg   Physical Exam: General: Obese, well-appearing, no acute distress HENT: [place], AT, OP clear, MMM Eyes: EOMI, no scleral icterus Respiratory: Fine inspiratory bibasilar crackles Cardiovascular: RRR, -M/R/G, no  JVD Extremities:-Edema,-tenderness Neuro: AAO x4, CNII-XII grossly intact Skin: Intact, no rashes or bruising Psych: Normal mood, normal affect  Relevant Previous Labs:  03/14/2020 CRP: 5 ACE level: 26 Rheumatoid arthritis factor: <10 ANA: Negative A.Fumigatus #1 Abs - [facility] Negative Negative   Micropolyspora faeni, IgG - [facility] Negative Negative   Thermoactinomyces vulgaris, IgG - [facility] Negative Negative   A. Pullulans Abs - [facility] Negative Negative   Thermoact. Saccharii - [facility] Negative Negative   Pigeon Serum Abs - [facility] Negative Negative    Pulmonary Function Test (03/14/2020) FVC 1.38 liters ( 39%), fev1 1.19 liters (43%), ratio 86, fef l/s 58%, exp flow volume loop is restrictive, c/w severe restriction  TTE (04/03/2020)  NORMAL LEFT VENTRICULAR SYSTOLIC FUNCTION (EF >16%) NORMAL RIGHT VENTRICULAR SYSTOLIC FUNCTION  MILD VALVULAR REGURGITATION NO VALVULAR STENOSIS  Normal pulmonary pressure   Ref Range & Units 1 d ago  Fluid Type-FCT  BODY FLUID RLL VC   Comment: CORRECTED ON 01/06 AT 1153: PREVIOUSLY REPORTED AS Bronch [place]  Color, Fluid  PINK   Appearance, Fluid CLEAR CLOUDYAbnormal   Total Nucleated Cell Count, Fluid 0 - 1,000 cu mm 88   Neutrophil Count, Fluid 0 - 25 % 35High   Lymphs, Fluid % 29   Monocyte-Macrophage-Serous Fluid 50 - 90 % 32Low   Eos, Fluid % 4   Comment: Performed at [facility], [place] [address], [place], [place] [address]   Assessment & Plan:   Dyspnea on exertion - persistent, unchanged Chronic Hypoxic Respiratory Failure - on baseline oxygen 4-5L  Pulmonary Fibrosis, unknown etiology - worsening compared to 02/2020 chest imaging with worsening subjective symptoms  Suspect he has had longstanding underlying ILD without complication that has been exacerbated in setting of recent chemotherapy/radiation +/- viral infection now resulting in oxygen requirement. Underwent bronchoscopy on 05/19/19 which demonstrated mixed  cellularity on BAL (increased neutrophil >1%, lymphocyte >15% and eos >3%). No definitive evidence of infection. Based on imaging and clinical presentation, differential would including drug-induced pneumonitis, fibrotic+/- cellular NSIP, hypersensitivity pneumonitis (though no clear exposures), COP or IPF. Recommend continuing to hold chemotherapy at this time and start high dose steroids with plan to repeat chest imaging in 4-6 weeks.  - Start high dose prednisone 60 mg x 1 month. Will be tapered as an outpatient  - Continue Bactrim prophylaxis  - Will arrange for follow-up with me on 06/06/20 and Dr. Chase Caller in ILD clinic in Feb - Continue home supplemental oxygen of 4L - ILD Questionnaire provided  Independent Care Time: 35 Minutes.   Rodman Pickle, M.D. [facility] Pulmonary/Critical Care Medicine 05/19/2020 11:41 AM   Please see Amion for pager number to reach on-call Pulmonary and Critical Care Team.

## 2020-05-19 NOTE — Discharge Summary (Signed)
Name: Garrett Peterson MRN: [id number] DOB: 19-Mar-1946 75 y.o. PCP: Albina Billet, MD  Date of Admission: 05/16/2020  9:45 AM Date of Discharge: 05/19/2020 Attending Physician: Axel Filler, *  Discharge Diagnosis: 1. Chronic hypoxic respiratory failure  Discharge Medications: Allergies as of 05/19/2020   No Known Allergies     Medication List    TAKE these medications   albuterol 108 (90 Base) MCG/ACT inhaler Commonly known as: VENTOLIN HFA Inhale 2 puffs into the lungs every 6 (six) hours as needed for wheezing or shortness of breath.   capecitabine 500 MG tablet Commonly known as: XELODA Take 3 tablets (1,500 mg total) by mouth 2 (two) times daily after a meal. Take Monday through Friday during radiation.   dextromethorphan-guaiFENesin 30-600 MG 12hr tablet Commonly known as: MUCINEX DM Take 1 tablet by mouth 2 (two) times daily as needed for cough.   lidocaine-prilocaine cream Commonly known as: EMLA Apply to affected area once   metoprolol tartrate 25 MG tablet Commonly known as: LOPRESSOR Take 25 mg by mouth 2 (two) times daily.   predniSONE 20 MG tablet Commonly known as: DELTASONE Take 3 tablets (60 mg total) by mouth daily with breakfast. Start taking on: May 20, 2020 What changed: how much to take   sertraline 25 MG tablet Commonly known as: ZOLOFT Take 1 tablet (25 mg total) by mouth daily.   sulfamethoxazole-trimethoprim 400-80 MG tablet Commonly known as: BACTRIM Take 1 tablet by mouth every 12 (twelve) hours.   temazepam 30 MG capsule Commonly known as: RESTORIL Take 30 mg by mouth at bedtime.       Disposition and follow-up:   Mr.Garrett Peterson was discharged from [facility] in Stable condition.  At the hospital follow up visit please address:  1.  Chronic hypoxic respiratory failure- follow up with pulmonology regarding initiation of high dose prednisone, 60 mg for one month followed with taper.   2.  Labs /  imaging needed at time of follow-up: none  3.  Pending labs/ test needing follow-up: Fungus culture, acid fast smear/culture, pneumocystis smear  Follow-up Appointments: Dr. Loanne Drilling 06/06/2020  Dr. Chase Caller in ILD clinic in February   Hospital Course by problem list: 1. 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 Bronchoscopy was performed during hospital admission which showed cell count from BAL with mixed picture- 88 cells total, 4% eos, 35% neutrophil, 32% monocyte-macrophage, 29% lymphs. Culture showed NGTD. TTE results did not show any evidence of shunt that may be contributing to hypoxia. Pulmonology recommended high dose steroids, 60 mg prednisone for one month and then to taper. Continued bactrim 400-44m BID. Plan to follow up with pulmonology and ILD clinic.   Discharge Vitals:   BP 136/74 (BP Location: Left Arm)   Pulse 68   Temp 97.7 F (36.5 C) (Oral)   Resp 20   Ht _0  (1.727 m)   Wt 97.5 kg   SpO2 96%   BMI 32.68 kg/m   Pertinent Labs, Studies, and Procedures:   Recent Results (from the past 720 hour(s))  Acid Fast Smear (AFB)     Status: None   Collection Time: 04/24/20  2:19 PM   Specimen: Bronchoalveolar Lavage; Respiratory  Result Value Ref Range Status   AFB Specimen Processing Concentration  Final   Acid Fast Smear Negative  Final    Comment: (NOTE)  Performed At: [facility] [place], [place] [id number] Rush Farmer MD [id number]    Source (AFB) BRONCHIAL ALVEOLAR LAVAGE  Final    Comment: Performed at [facility], [address] [place], [place] [address]  Aspergillus Ag, BAL/Serum     Status: None   Collection Time: 04/24/20  2:19 PM   Specimen: Bronchial Alveolar Lavage; Respiratory  Result Value Ref Range Status   Aspergillus Ag, BAL/Serum 0.05 0.00 - 0.49 Index  Final    Comment: (NOTE) Performed At: [facility] [place], [place] [id number] Rush Farmer MD PJ:[id number]   Culture, BAL-quantitative     Status: None   Collection Time: 04/24/20  2:19 PM   Specimen: Bronchoalveolar Lavage; Respiratory  Result Value Ref Range Status   Specimen Description   Final    BRONCHIAL ALVEOLAR LAVAGE Performed at [facility], [address] [place], [place] [address]    Special Requests   Final    NONE Performed at [facility], [address] [place], [place] [address]    Gram Stain   Final    RARE WBC PRESENT, PREDOMINANTLY MONONUCLEAR NO ORGANISMS SEEN    Culture   Final    NO GROWTH 2 DAYS Performed at [facility], [place] [address] [place], [place] [address]    Report Status 04/27/2020 FINAL  Final  Anaerobic culture     Status: None   Collection Time: 04/24/20  2:19 PM   Specimen: Bronchoalveolar Lavage  Result Value Ref Range Status   Specimen Description   Final    BRONCHIAL ALVEOLAR LAVAGE Performed at [facility], [address], [place], [place] [address]    Special Requests   Final    NONE Performed at [facility], [address], [place],  [address]    Culture   Final    NO ANAEROBES ISOLATED Performed at [facility], [place] [address], [place],  [address]    Report Status 04/29/2020 FINAL  Final  Resp panel by RT-PCR (RSV, Flu A&B, Covid) Nasopharyngeal Swab     Status: None   Collection Time: 05/16/20 10:06 AM   Specimen: Nasopharyngeal Swab; Nasopharyngeal(NP) swabs in vial transport medium  Result Value Ref Range Status   SARS Coronavirus 2 by RT PCR NEGATIVE NEGATIVE Final    Comment: (NOTE) SARS-CoV-2 target nucleic acids are NOT DETECTED.  The SARS-CoV-2 RNA is generally detectable in upper respiratory specimens during the acute phase of infection. The lowest concentration of SARS-CoV-2 viral copies this assay can detect  is 138 copies/mL. A negative result does not preclude SARS-Cov-2 infection and should not be used as the sole basis for treatment or other patient management decisions. A negative result may occur with  improper specimen collection/handling, submission of specimen other than nasopharyngeal swab, presence of viral mutation(s) within the areas targeted by this assay, and inadequate number of viral copies(<138 copies/mL). A negative result must be combined with clinical observations, patient history, and epidemiological information. The expected result is Negative.  Fact Sheet for Patients:  EntrepreneurPulse.com.au  Fact Sheet for Healthcare Providers:  IncredibleEmployment.be  This test is no t yet approved or cleared by the [place] FDA and  has been authorized for detection and/or diagnosis of SARS-CoV-2 by FDA under an Emergency Use Authorization (EUA). This EUA will remain  in effect (meaning this test can be used) for the duration of the COVID-19 declaration under Section 564(b)(1) of the Act, 21  U.S.C.section 360bbb-3(b)(1), unless the authorization is terminated  or revoked sooner.       Influenza A by PCR NEGATIVE NEGATIVE Final   Influenza B by PCR NEGATIVE NEGATIVE Final    Comment: (NOTE) The Xpert Xpress SARS-CoV-2/FLU/RSV plus assay is intended as an aid in the diagnosis of influenza from Nasopharyngeal swab specimens and should not be used as a sole basis for treatment. Nasal washings and aspirates are unacceptable for Xpert Xpress SARS-CoV-2/FLU/RSV testing.  Fact Sheet for Patients: EntrepreneurPulse.com.au  Fact Sheet for Healthcare Providers: IncredibleEmployment.be  This test is not yet approved or cleared by the [place] FDA and has been authorized for detection and/or diagnosis of SARS-CoV-2 by FDA under an Emergency Use Authorization (EUA). This EUA will remain in effect  (meaning this test can be used) for the duration of the COVID-19 declaration under Section 564(b)(1) of the Act, 21 U.S.C. section 360bbb-3(b)(1), unless the authorization is terminated or revoked.     Resp Syncytial Virus by PCR NEGATIVE NEGATIVE Final    Comment: (NOTE) Fact Sheet for Patients: EntrepreneurPulse.com.au  Fact Sheet for Healthcare Providers: IncredibleEmployment.be  This test is not yet approved or cleared by the [place] FDA and has been authorized for detection and/or diagnosis of SARS-CoV-2 by FDA under an Emergency Use Authorization (EUA). This EUA will remain in effect (meaning this test can be used) for the duration of the COVID-19 declaration under Section 564(b)(1) of the Act, 21 U.S.C. section 360bbb-3(b)(1), unless the authorization is terminated or revoked.  Performed at [facility], [place] [address] [place], [place] [address]   MRSA PCR Screening     Status: None   Collection Time: 05/16/20 11:54 PM   Specimen: Nasopharyngeal  Result Value Ref Range Status   MRSA by PCR NEGATIVE NEGATIVE Final    Comment:        The GeneXpert MRSA Assay (FDA approved for NASAL specimens only), is one component of a comprehensive MRSA colonization surveillance program. It is not intended to diagnose MRSA infection nor to guide or monitor treatment for MRSA infections. Performed at [facility], [place] [address] [place], [place] [address]   Culture, respiratory     Status: None   Collection Time: 05/18/20 10:04 AM   Specimen: Bronchoalveolar Lavage; Respiratory  Result Value Ref Range Status   Specimen Description BRONCHIAL ALVEOLAR LAVAGE  Final   Special Requests RLL  Final   Gram Stain NO WBC SEEN NO ORGANISMS SEEN   Final   Culture   Final    NO GROWTH 2 DAYS Performed at [facility], [address] [place], [place] [address]    Report Status 05/20/2020 FINAL  Final    CBC Latest Ref Rng  & Units 05/18/2020 05/17/2020 05/16/2020  WBC 4.0 - 10.5 K/uL 8.2 10.9(H) 14.5(H)  Hemoglobin 13.0 - 17.0 g/dL 15.0 15.1 15.0  Hematocrit 39.0 - 52.0 % 43.7 43.5 44.7  Platelets 150 - 400 K/uL 166 173 195    BMP Latest Ref Rng & Units 05/18/2020 05/17/2020 05/16/2020  Glucose 70 - 99 mg/dL 127(H) 187(H) 161(H)  BUN 8 - 23 mg/dL _0 Creatinine 0.61 - 1.24 mg/dL 1.08 1.11 1.12  Sodium 135 - 145 mmol/L 138 139 138  Potassium 3.5 - 5.1 mmol/L 3.8 4.1 4.2  Chloride 98 - 111 mmol/L 101 96(L) 101  CO2 22 - 32 mmol/L _1 Calcium 8.9 - 10.3 mg/dL 8.9 9.1 9.3   DG Chest  2 View  Result Date: 05/16/2020 CLINICAL DATA:  Shortness of breath. EXAM: CHEST - 2 VIEW COMPARISON:  03/03/2020 FINDINGS: Diffuse interstitial lung disease again noted with patchy areas of airspace opacity in the lower lungs bilaterally, similar to prior but slightly more confluent today. Cardiopericardial silhouette is at upper limits of normal for size. Right Port-A-Cath again noted. IMPRESSION: Diffuse interstitial lung disease with some more confluent patchy opacity in the lower lungs bilaterally, increased since prior chest x-ray. Electronically Signed   By: Misty Stanley M.D.   On: 05/16/2020 10:56   CTA Chest 05/15/2020 IMPRESSION: 1. No definite evidence of pulmonary embolus. 2. Bilateral patchy airspace opacities are noted which are slightly increased compared to prior exam, concerning for multifocal pneumonia or potentially post infectious scarring from previous infection. Some degree of bronchiectasis is also noted in both lower lobes. 3. Aortic atherosclerosis.  Discharge Instructions: Discharge Instructions    Call MD for:  difficulty breathing, headache or visual disturbances   Complete by: As directed    Call MD for:  extreme fatigue   Complete by: As directed    Call MD for:  persistant dizziness or light-headedness   Complete by: As directed    Call MD for:  severe uncontrolled pain   Complete by: As  directed    Call MD for:  temperature >100.4   Complete by: As directed    Diet - low sodium heart healthy   Complete by: As directed    Discharge instructions   Complete by: As directed    Mr. Lasalle, Abee were admitted to the hospital for shortness of breath that was worsening.  You were evaluated by the lung doctors while you were here.  We did some scans of your lungs and found that you have worsening scarring, also called fibrosis.  We did a procedure called a bronchoscopy, where we looked in your lungs with a camera and also pulled some fluid to send for work-up.  So far the work-up has revealed there is no sign of infection, however some results are still out.  Your medication was changed.  Your steroid prednisone dose was increased to 60 mg/day, which is 3 tablets in the morning.  In addition, we started you on a antibiotic called Bactrim to help prevent infections while you are on such a high dose of steroid.  Please make sure to follow-up with Dr. Loanne Drilling on the date listed in your discharge summary paperwork.  In addition, follow-up with your primary care doctor as needed.  It was a pleasure meeting you and your grandson, and we wish you the best.  - Dr. Charleen Kirks   Increase activity slowly   Complete by: As directed    No wound care   Complete by: As directed       Signed: Harlow Ohms, DO Internal Medicine PGY-2    05/19/2020, 4:39 PM

## 2020-05-19 NOTE — Progress Notes (Signed)
   Subjective:   Mr. Halt reports he is feeling great this morning. He was able to bend over this morning to pick something up without experiencing shortness of breathe and this made him excited. He is hopeful to go home today.    Objective:  Vital signs in last 24 hours: Vitals:   05/18/20 1926 05/18/20 2146 05/18/20 2318 05/19/20 0337  BP: 134/75 130/72 (!) 126/58 131/72  Pulse: 66 66 60 (!) 59  Resp: _0 Temp: 98.4 F (36.9 C)  98.2 F (36.8 C) 97.6 F (36.4 C)  TempSrc: Oral  Oral Oral  SpO2: 93%  96% 96%  Weight:    97.5 kg  Height:       Physical Exam Vitals and nursing note reviewed.  Constitutional:      General: He is not in acute distress.    Appearance: He is normal weight.  HENT:     Head: Normocephalic and atraumatic.  Pulmonary:     Effort: Pulmonary effort is normal. No respiratory distress.  Skin:    General: Skin is warm and dry.  Neurological:     General: No focal deficit present.     Mental Status: He is alert and oriented to person, place, and time. Mental status is at baseline.  Psychiatric:        Mood and Affect: Mood normal.        Behavior: Behavior normal.    Assessment/Plan:  Principal Problem:   Chronic hypoxemic respiratory failure (HCC) Active Problems:   Rectal cancer (HCC)   HTN (hypertension)  Garrett Peterson is a 75 y.o. 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  # Chronic hypoxic respiratory failure Suspected pre-existing ILD, possibly NSIP versus IPF, with exacerbation 2/2 chemo/radiation vs post-COVID sequelae vs atypical infection. Cell count from BAL shows mixed picture with 88 cells total; 4% eos, 35% neutrophil, 32% monocyte-macrophage, 29% lymphs. Culture so far is NGTD @ 24 hours. TTE results do not show any evidence of shunt that may be contributing to hypoxia.   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 Started on adjuvant chemo treatment in 6/202 with FOLFOX. Per last Onc note, currently in remission with repeat CT/abd/pelvi showing no evidence of recurrence.   - Monitor  # CAD s/p CABG - Continue home med: Lopressor 247mBID  DVT prophx: Lovenox Diet: Regular Bowel: Miralax Code: Full  Prior to Admission Living Arrangement: Home  Anticipated Discharge Location: Home Barriers to Discharge: Medical work-up Dispo: Anticipated discharge in approximately 0-1 day(s).   Dr. IuJose Persianternal Medicine PGY-2  Pager: [phone] 5pm on weekdays and 1pm on weekends: On Call pager [phone] 14841/11/2020, 6:31 AM

## 2020-05-20 LAB — CULTURE, RESPIRATORY W GRAM STAIN
Culture: NO GROWTH
Gram Stain: NONE SEEN

## 2020-05-22 LAB — ACID FAST SMEAR (AFB, MYCOBACTERIA): Acid Fast Smear: NEGATIVE

## 2020-05-22 LAB — HYPERSENSITIVITY PNEUMONITIS
A. Pullulans Abs: NEGATIVE
A.Fumigatus #1 Abs: NEGATIVE
Micropolyspora faeni, IgG: NEGATIVE
Pigeon Serum Abs: NEGATIVE
Thermoact. Saccharii: NEGATIVE
Thermoactinomyces vulgaris, IgG: NEGATIVE

## 2020-06-06 ENCOUNTER — Other Ambulatory Visit: Payer: Self-pay

## 2020-06-06 ENCOUNTER — Encounter: Payer: Self-pay | Admitting: Pulmonary Disease

## 2020-06-06 ENCOUNTER — Telehealth: Payer: Self-pay | Admitting: Pulmonary Disease

## 2020-06-06 ENCOUNTER — Ambulatory Visit (INDEPENDENT_AMBULATORY_CARE_PROVIDER_SITE_OTHER): Payer: Medicare Other | Admitting: Pulmonary Disease

## 2020-06-06 VITALS — BP 128/62 | HR 60 | Temp 97.4°F | Ht 69.0 in | Wt 220.5 lb

## 2020-06-06 DIAGNOSIS — J849 Interstitial pulmonary disease, unspecified: Secondary | ICD-10-CM | POA: Diagnosis not present

## 2020-06-06 DIAGNOSIS — J189 Pneumonia, unspecified organism: Secondary | ICD-10-CM | POA: Diagnosis not present

## 2020-06-06 DIAGNOSIS — J9611 Chronic respiratory failure with hypoxia: Secondary | ICD-10-CM

## 2020-06-06 MED ORDER — SULFAMETHOXAZOLE-TRIMETHOPRIM 400-80 MG PO TABS: 1.0000 | ORAL_TABLET | Freq: Two times a day (BID) | ORAL | 0 refills | Status: DC

## 2020-06-06 MED ORDER — PREDNISONE 5 MG PO TABS: ORAL_TABLET | ORAL | 0 refills | Status: DC

## 2020-06-06 NOTE — Progress Notes (Signed)
Subjective:   PATIENT ID: Garrett Peterson GENDER: male DOB: 06/12/1945, MRN: [id number]   HPI  Chief Complaint  Patient presents with  . Hospitalization Follow-up    Here for HFU--1/4-1/7 hospital stay with bronch and ct done.  SHOB is about the same.       Reason for Visit: Hospital follow-up   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  Pulmonary was consulted during his admission from 1/4-1/7. He had previously underwent bronchoscopy on 03/14/20 with Dr. Lanney Gins in [place]; results were negative for cultures and cytology. Prior to admission he was on prednisone for 2 months and started on bactrim the week prior. His CT demonstrated progressive GGO so he was advised for admission. When seen at [facility], he underwent second bronchoscopy with BAL on 05/18/20 which demonstrated mixed cellularity, neg cultures, neg cytology. His steroids were increased to 60 mg daily with plan for slow taper.  He reports since discharge that his breathing is unchanged despite steroids and feels his quality of life is poor. He ambulates within the house and motivates himself to stay active but it is not at the level he was prior. Dyspnea worsens with exertion and improves with rest. Denies cough and wheezing. He is completed 7 out of 8 of chemo treatments and not planned for any further infusions due to his respiratory issues.  Social History: Never smoker  I have personally reviewed patient's past medical/family/social history, allergies, current medications.  Past Medical History:  Diagnosis Date  . Actinic keratosis   . Adenomatous polyp of colon   . Arthritis   . Basal cell carcinoma   . CAD (coronary artery disease)   . Cancer ([place]) 2021   rectal  . Hyperlipidemia   . Pneumonia   . Pre-diabetes   .  Respiratory failure with hypoxia (HCC)      Family History  Problem Relation Age of Onset  . Lung disease Father      Social History   Occupational History  . Not on file  Tobacco Use  . Smoking status: Never Smoker  . Smokeless tobacco: Former Systems developer    Types: Secondary school teacher  . Vaping Use: Never used  Substance and Sexual Activity  . Alcohol use: Yes    Alcohol/week: 0.0 standard drinks    Comment: OCCAS  . Drug use: Never  . Sexual activity: Not Currently    No Known Allergies   Outpatient Medications Prior to Visit  Medication Sig Dispense Refill  . metoprolol tartrate (LOPRESSOR) 25 MG tablet Take 25 mg by mouth 2 (two) times daily.    . temazepam (RESTORIL) 30 MG capsule Take 30 mg by mouth at bedtime.     . predniSONE (DELTASONE) 20 MG tablet Take 3 tablets (60 mg total) by mouth daily with breakfast. 90 tablet 0  . sulfamethoxazole-trimethoprim (BACTRIM) 400-80 MG tablet Take 1 tablet by mouth every 12 (twelve) hours. 60 tablet 0  . albuterol (VENTOLIN HFA) 108 (90 Base) MCG/ACT inhaler Inhale 2 puffs into the lungs every 6 (six) hours as needed for wheezing or shortness of breath. (Patient not taking: No sig reported) 1 each 0  . capecitabine (XELODA) 500 MG tablet Take 3 tablets (1,500 mg total) by mouth 2 (two) times daily after a meal. Take Monday through Friday during radiation. (Patient not  taking: No sig reported) 126 tablet 0  . dextromethorphan-guaiFENesin (MUCINEX DM) 30-600 MG 12hr tablet Take 1 tablet by mouth 2 (two) times daily as needed for cough. (Patient not taking: No sig reported) 60 tablet 0  . lidocaine-prilocaine (EMLA) cream Apply to affected area once (Patient not taking: No sig reported) 30 g 3  . sertraline (ZOLOFT) 25 MG tablet Take 1 tablet (25 mg total) by mouth daily. (Patient not taking: Reported on 06/06/2020) 30 tablet 0   No facility-administered medications prior to visit.    Review of Systems  Constitutional: Positive for  malaise/fatigue. Negative for chills, diaphoresis, fever and weight loss.  HENT: Negative for congestion.   Respiratory: Positive for shortness of breath. Negative for cough, hemoptysis, sputum production and wheezing.   Cardiovascular: Negative for chest pain, palpitations and leg swelling.     Objective:   Vitals:   06/06/20 0930  BP: 128/62  Pulse: 60  Temp: (!) 97.4 F (36.3 C)  TempSrc: Tympanic  SpO2: 96%  Weight: 220 lb 8 oz (100 kg)  Height: 5' 9" (1.753 m)   SpO2: 96 % (4 liters)  Physical Exam: General: Chronically ill-appearing, no acute distress HENT: [place], AT Eyes: EOMI, no scleral icterus Respiratory: Faint bibasilar crackles.  No wheezing or rales Cardiovascular: RRR, -M/R/G, no JVD Extremities:-Edema,-tenderness Neuro: AAO x4, CNII-XII grossly intact Skin: Intact, no rashes or bruising Psych: Normal mood, normal affect  Data Reviewed:  Imaging: CT Abdomen from 01/26/10 demonstrated minimal/mild subpleural reticulation that has remained unchanged on CT Chest 09/07/19. In CT Chest 03/02/20 extensive bilateral ground glass opacities with mid-lower lung predominance. CTA 05/16/19 with progressive bilateral opacities, negative for PE.      PFT: 03/14/20 FVC 1.38 (39%) FEV1 1.19 (43%) Ratio 86   Interpretation: TLC not available. Decreased FVC and FEV1 suggest severe restrictive defect  Labs: 03/14/2020 CRP: 5 ACE level: 26 Rheumatoid arthritis factor: <10 ANA: Negative A.Fumigatus #1 Abs - [facility] Negative Negative   Micropolyspora faeni, IgG - [facility] Negative Negative   Thermoactinomyces vulgaris, IgG - [facility] Negative Negative   A. Pullulans Abs - [facility] Negative Negative   Thermoact. Saccharii - [facility] Negative Negative   P[place]on Serum Abs - [facility] Negative Negative    BAL 05/18/20: Mixed cellularity. Neg for infection. Neg cytology  Ref Range & Units 1 d ago  Fluid Type-FCT  BODY FLUID RLL VC   Comment: CORRECTED ON 01/06 AT 1153: PREVIOUSLY  REPORTED AS Bronch [place]  Color, Fluid  PINK   Appearance, Fluid CLEAR CLOUDYAbnormal   Total Nucleated Cell Count, Fluid 0 - 1,000 cu mm 88   Neutrophil Count, Fluid 0 - 25 % 35High   Lymphs, Fluid % 29   Monocyte-Macrophage-Serous Fluid 50 - 90 % 32Low   Eos, Fluid % 4   Comment: Performed at [facility] [address] [place], [place] [address]   Imaging, labs and test noted above have been reviewed independently by me.    Assessment & Plan:   Discussion: 75 year old male with underlying ILD (unknown etiology, ANA -, RA -) without complication exacerbated in the setting of recent chemotherapy/radiation +/- viral infection.  Bronchoscopy on 05/18/2020 demonstrated mixed cellularity on BAL (increased neutrophil > 1%, lymphocyte > 15% and eosinophil > 3%).  No infection.  Reviewed CTA and would be concerned about drug-induced pneumonitis, fibrotic +/-cellular NSIP, hyperpneumonitis (though no clear exposures) COP or IPF. Improved resting oxygenation however still requires 4-5L O2 with exertion. High dose steroids seem to have partial  effect and I now suspect that he has a more fibrotic component to his ILD. Will start weaning steroids. Counseled patient and wife regarding the clinical course of viral/drug induced pneumonitis with the expectation he may be slow to heal from this.  --Recommend continuing to hold chemotherapy --Decrease prednisone to 30 mg daily x 2 week, followed by 20 mg until seen in clinic --Continue supplemental oxygen for goal >88%. Recommend wearing with exertion and sleep --Order CT Chest without contrast on 06/26/20 --Follow-up scheduled with Dr. Chase Caller on 07/04/20   Health Maintenance Immunization History  Administered Date(s) Administered  . PFIZER(Purple Top)SARS-COV-2 Vaccination 01/18/2020  . Zoster Recombinat (Shingrix) 01/12/2018, 03/21/2018   CT Lung Screen-not indicated  Orders Placed This Encounter  Procedures  . CT Chest Wo Contrast     Standing Status:   Future    Standing Expiration Date:   06/06/2021    Order Specific Question:   Preferred imaging location?    Answer:   [place] ordered this encounter  Medications  . predniSONE (DELTASONE) 5 MG tablet    Sig: Take 6 tablets daily for two weeks, followed by 4 tablets daily for two weeks    Dispense:  140 tablet    Refill:  0  . sulfamethoxazole-trimethoprim (BACTRIM) 400-80 MG tablet    Sig: Take 1 tablet by mouth every 12 (twelve) hours.    Dispense:  60 tablet    Refill:  0    No follow-ups on file.  I have spent a total time of 35-minutes on the day of the appointment reviewing prior documentation, coordinating care and discussing medical diagnosis and plan with the patient/family. Imaging, labs and tests included in this note have been reviewed and interpreted independently by me.  Greer, MD [facility] 06/06/2020 10:26 AM  Office Number [phone]

## 2020-06-06 NOTE — Patient Instructions (Signed)
--  Recommend continuing to hold chemotherapy --Decrease prednisone to 30 mg daily x 2 week, followed by 20 mg until seen in clinic --Order CT Chest without contrast on 06/26/20 --Follow-up scheduled with Dr. Chase Caller on 07/04/20

## 2020-06-06 NOTE — Telephone Encounter (Signed)
Pt aware of the appt

## 2020-06-08 ENCOUNTER — Encounter: Payer: Self-pay | Admitting: Pulmonary Disease

## 2020-06-09 ENCOUNTER — Telehealth: Payer: Self-pay | Admitting: *Deleted

## 2020-06-09 ENCOUNTER — Other Ambulatory Visit: Payer: Self-pay

## 2020-06-09 DIAGNOSIS — C20 Malignant neoplasm of rectum: Secondary | ICD-10-CM

## 2020-06-09 NOTE — Telephone Encounter (Signed)
He said that he still does not have his lungs fixed yet and he is still seeing pulmonology, but that he needs to see a dentist and they want to make sure that the chemotherapy is out of his system enough to treat him. Please call him if you have any other questions regarding this. He did not say that he was going to reschedule at this time until he gets his lungs straightened out.

## 2020-06-09 NOTE — Telephone Encounter (Signed)
Called requesting if he can get dental exam and treatment Monday. States that the dentist can see him Monday, but wanted our office to say that patient is OK to have this done since he had chemotherapy. Please advise

## 2020-06-09 NOTE — Telephone Encounter (Signed)
Message on 12/1 states that patient was going to go to [facility] pulmonology for work up of [place] lung complicaitons.  He was to call us back to scheudle f/u.  Can you ask him if he plans on returning to see Dr. Tasia Catchings and/or receive treatment?

## 2020-06-09 NOTE — Telephone Encounter (Addendum)
Garrett Peterson, please schedule patient next week for port flush with labs and Mychart visit 2-3 days after labs. Cancel PF on 2/21. Lab orders entered. Please notify pt of appt.

## 2020-06-09 NOTE — Telephone Encounter (Signed)
Per MD, there are not restrictions from Oncology aspect for patient to get dental work done. Patient has been notified. Pt was also notified that Dr. Tasia Catchings highly recommends for him to have a lab/MD follow up with her. Patient states that it is difficult for him to get out to appts due to his breathing problems, but he is willing to come for labs and have a Mychart vist.

## 2020-06-09 NOTE — Telephone Encounter (Signed)
Done.. Pt was made aware.[name] Kitchen

## 2020-06-14 ENCOUNTER — Other Ambulatory Visit: Payer: Self-pay

## 2020-06-14 ENCOUNTER — Inpatient Hospital Stay: Payer: Medicare Other | Attending: Oncology

## 2020-06-14 DIAGNOSIS — N281 Cyst of kidney, acquired: Secondary | ICD-10-CM | POA: Diagnosis not present

## 2020-06-14 DIAGNOSIS — R918 Other nonspecific abnormal finding of lung field: Secondary | ICD-10-CM | POA: Insufficient documentation

## 2020-06-14 DIAGNOSIS — Z85828 Personal history of other malignant neoplasm of skin: Secondary | ICD-10-CM | POA: Diagnosis not present

## 2020-06-14 DIAGNOSIS — Z8616 Personal history of COVID-19: Secondary | ICD-10-CM | POA: Diagnosis not present

## 2020-06-14 DIAGNOSIS — J9611 Chronic respiratory failure with hypoxia: Secondary | ICD-10-CM | POA: Diagnosis not present

## 2020-06-14 DIAGNOSIS — C20 Malignant neoplasm of rectum: Secondary | ICD-10-CM | POA: Diagnosis present

## 2020-06-14 LAB — CBC WITH DIFFERENTIAL/PLATELET
Abs Immature Granulocytes: 0.06 10*3/uL (ref 0.00–0.07)
Basophils Absolute: 0 10*3/uL (ref 0.0–0.1)
Basophils Relative: 0 %
Eosinophils Absolute: 0.1 10*3/uL (ref 0.0–0.5)
Eosinophils Relative: 1 %
HCT: 42.7 % (ref 39.0–52.0)
Hemoglobin: 15 g/dL (ref 13.0–17.0)
Immature Granulocytes: 1 %
Lymphocytes Relative: 8 %
Lymphs Abs: 1 10*3/uL (ref 0.7–4.0)
MCH: 34.2 pg — ABNORMAL HIGH (ref 26.0–34.0)
MCHC: 35.1 g/dL (ref 30.0–36.0)
MCV: 97.3 fL (ref 80.0–100.0)
Monocytes Absolute: 0.5 10*3/uL (ref 0.1–1.0)
Monocytes Relative: 4 %
Neutro Abs: 10.6 10*3/uL — ABNORMAL HIGH (ref 1.7–7.7)
Neutrophils Relative %: 86 %
Platelets: 158 10*3/uL (ref 150–400)
RBC: 4.39 MIL/uL (ref 4.22–5.81)
RDW: 12.9 % (ref 11.5–15.5)
WBC: 12.3 10*3/uL — ABNORMAL HIGH (ref 4.0–10.5)
nRBC: 0 % (ref 0.0–0.2)

## 2020-06-14 LAB — COMPREHENSIVE METABOLIC PANEL
ALT: 110 U/L — ABNORMAL HIGH (ref 0–44)
AST: 55 U/L — ABNORMAL HIGH (ref 15–41)
Albumin: 3.9 g/dL (ref 3.5–5.0)
Alkaline Phosphatase: 118 U/L (ref 38–126)
Anion gap: 10 (ref 5–15)
BUN: 18 mg/dL (ref 8–23)
CO2: 26 mmol/L (ref 22–32)
Calcium: 9.4 mg/dL (ref 8.9–10.3)
Chloride: 101 mmol/L (ref 98–111)
Creatinine, Ser: 1.2 mg/dL (ref 0.61–1.24)
GFR, Estimated: >60 mL/min (ref >60)
Glucose, Bld: 279 mg/dL — ABNORMAL HIGH (ref 70–99)
Potassium: 5.2 mmol/L — ABNORMAL HIGH (ref 3.5–5.1)
Sodium: 137 mmol/L (ref 135–145)
Total Bilirubin: 0.8 mg/dL (ref 0.3–1.2)
Total Protein: 7.5 g/dL (ref 6.5–8.1)

## 2020-06-14 LAB — ACID FAST CULTURE WITH REFLEXED SENSITIVITIES (MYCOBACTERIA): Acid Fast Culture: NEGATIVE

## 2020-06-14 MED ORDER — SODIUM CHLORIDE 0.9% FLUSH
10.0000 mL | INTRAVENOUS | Status: DC | PRN
  Administered 2020-06-14: 10 mL via INTRAVENOUS
  Filled 2020-06-14: qty 10

## 2020-06-14 MED ORDER — HEPARIN SOD (PORK) LOCK FLUSH 100 UNIT/ML IV SOLN
INTRAVENOUS | Status: AC
  Filled 2020-06-14: qty 5

## 2020-06-14 MED ORDER — HEPARIN SOD (PORK) LOCK FLUSH 100 UNIT/ML IV SOLN
500.0000 [IU] | Freq: Once | INTRAVENOUS | Status: AC
  Administered 2020-06-14: 500 [IU] via INTRAVENOUS
  Filled 2020-06-14: qty 5

## 2020-06-15 LAB — CEA: CEA: 25.1 ng/mL — ABNORMAL HIGH (ref 0.0–4.7)

## 2020-06-16 ENCOUNTER — Encounter: Payer: Self-pay | Admitting: Oncology

## 2020-06-16 ENCOUNTER — Telehealth: Payer: Self-pay

## 2020-06-16 ENCOUNTER — Inpatient Hospital Stay (HOSPITAL_BASED_OUTPATIENT_CLINIC_OR_DEPARTMENT_OTHER): Payer: Medicare Other | Admitting: Oncology

## 2020-06-16 DIAGNOSIS — C20 Malignant neoplasm of rectum: Secondary | ICD-10-CM

## 2020-06-16 DIAGNOSIS — Z95828 Presence of other vascular implants and grafts: Secondary | ICD-10-CM | POA: Diagnosis not present

## 2020-06-16 DIAGNOSIS — J9611 Chronic respiratory failure with hypoxia: Secondary | ICD-10-CM | POA: Diagnosis not present

## 2020-06-16 NOTE — Progress Notes (Signed)
HEMATOLOGY-ONCOLOGY TeleHEALTH VISIT PROGRESS NOTE  I connected with Garrett Peterson on 06/16/20  at  3:00 PM EST by video enabled telemedicine visit and verified that I am speaking with the correct person using two identifiers. I discussed the limitations, risks, security and privacy concerns of performing an evaluation and management service by telemedicine and the availability of in-person appointments. The patient expressed understanding and agreed to proceed.   Other persons participating in the visit and their role in the encounter:  None  Patient's location: Home  Provider's location: office Chief Complaint:.  Follow-up for rectal cancer   INTERVAL HISTORY IBRAHAM LEVI is a 75 y.o. male who has above history reviewed by me today presents for follow up visit for management of rectal cancer Problems and complaints are listed below:  Patient was last seen by me in November 2021. He has finished 7 cycles of adjuvant chemotherapy.  Due to decreased performance status secondary to pneumonia/acute hypoxic respiratory failure, chemotherapy was interrupted, and the patient proceeded with radiation treatments.  Patient then continues to have refractory respiratory symptoms.  He follows up with pulmonology and is currently on steroid tapering course.  He is on nasal cannula oxygen.    Review of Systems  Constitutional: Positive for fatigue. Negative for appetite change, chills, fever and unexpected weight change.  HENT:   Negative for hearing loss and voice change.   Eyes: Negative for eye problems and icterus.  Respiratory: Positive for shortness of breath. Negative for chest tightness and cough.        Patient is on nasal cannula oxygen  Cardiovascular: Negative for chest pain and leg swelling.  Gastrointestinal: Negative for abdominal distention and abdominal pain.  Endocrine: Negative for hot flashes.  Genitourinary: Negative for difficulty urinating, dysuria and frequency.    Musculoskeletal: Negative for arthralgias.  Skin: Negative for itching and rash.  Neurological: Negative for light-headedness and numbness.  Hematological: Negative for adenopathy. Does not bruise/bleed easily.  Psychiatric/Behavioral: Negative for confusion.    Past Medical History:  Diagnosis Date  . Actinic keratosis   . Adenomatous polyp of colon   . Arthritis   . Basal cell carcinoma   . CAD (coronary artery disease)   . Cancer ([place]) 2021   rectal  . Hyperlipidemia   . Pneumonia   . Pre-diabetes   . Respiratory failure with hypoxia [facility])    Past Surgical History:  Procedure Laterality Date  . APPENDECTOMY    . BRONCHIAL WASHINGS  05/18/2020   Procedure: BRONCHIAL WASHINGS;  Surgeon: Margaretha Seeds, MD;  Location: [facility] ENDOSCOPY;  Service: Pulmonary;;  . CARDIAC CATHETERIZATION     PT DENIES  . COLONOSCOPY     2011,2015,2021  . CORONARY ARTERY BYPASS GRAFT    . FLEXIBLE BRONCHOSCOPY N/A 04/24/2020   Procedure: FLEXIBLE BRONCHOSCOPY;  Surgeon: Ottie Glazier, MD;  Location: [facility] ORS;  Service: Thoracic;  Laterality: N/A;  . IR SINUS/FIST TUBE CHK-NON GI  10/27/2019  . open heart surgery    . PORTACATH PLACEMENT Right 10/13/2019   Procedure: INSERTION PORT-A-CATH;  Surgeon: Herbert Pun, MD;  Location: [facility] ORS;  Service: General;  Laterality: Right;  . ROBOT ASSISTED LAPAROSCOPIC PARTIAL COLECTOMY  09/2019  . VIDEO BRONCHOSCOPY N/A 05/18/2020   Procedure: VIDEO BRONCHOSCOPY WITHOUT FLUORO;  Surgeon: Margaretha Seeds, MD;  Location: [facility];  Service: Pulmonary;  Laterality: N/A;    Family History  Problem Relation Age of Onset  . Lung disease Father     Social History  Socioeconomic History  . Marital status: Married    Spouse name: Not on file  . Number of children: Not on file  . Years of education: Not on file  . Highest education level: Not on file  Occupational History  . Not on file  Tobacco Use  . Smoking status: Never Smoker  . Smokeless  tobacco: Former Systems developer    Types: Secondary school teacher  . Vaping Use: Never used  Substance and Sexual Activity  . Alcohol use: Yes    Alcohol/week: 0.0 standard drinks    Comment: OCCAS  . Drug use: Never  . Sexual activity: Not Currently  Other Topics Concern  . Not on file  Social History Narrative  . Not on file   Social Determinants of Health   Financial Resource Strain: Not on file  Food Insecurity: Not on file  Transportation Needs: Not on file  Physical Activity: Not on file  Stress: Not on file  Social Connections: Not on file  Intimate Partner Violence: Not on file    Current Outpatient Medications on File Prior to Visit  Medication Sig Dispense Refill  . metoprolol tartrate (LOPRESSOR) 25 MG tablet Take 25 mg by mouth 2 (two) times daily.    . predniSONE (DELTASONE) 5 MG tablet Take 6 tablets daily for two weeks, followed by 4 tablets daily for two weeks 140 tablet 0  . sulfamethoxazole-trimethoprim (BACTRIM) 400-80 MG tablet Take 1 tablet by mouth every 12 (twelve) hours. 60 tablet 0  . temazepam (RESTORIL) 30 MG capsule Take 30 mg by mouth at bedtime.     [name] Kitchen albuterol (VENTOLIN HFA) 108 (90 Base) MCG/ACT inhaler Inhale 2 puffs into the lungs every 6 (six) hours as needed for wheezing or shortness of breath. (Patient not taking: No sig reported) 1 each 0  . capecitabine (XELODA) 500 MG tablet Take 3 tablets (1,500 mg total) by mouth 2 (two) times daily after a meal. Take Monday through Friday during radiation. (Patient not taking: No sig reported) 126 tablet 0  . dextromethorphan-guaiFENesin (MUCINEX DM) 30-600 MG 12hr tablet Take 1 tablet by mouth 2 (two) times daily as needed for cough. (Patient not taking: No sig reported) 60 tablet 0  . lidocaine-prilocaine (EMLA) cream Apply to affected area once (Patient not taking: No sig reported) 30 g 3  . sertraline (ZOLOFT) 25 MG tablet Take 1 tablet (25 mg total) by mouth daily. (Patient not taking: No sig reported) 30 tablet 0    No current facility-administered medications on file prior to visit.    No Known Allergies     Observations/Objective: Today's Vitals   06/16/20 1429  PainSc: 0-No pain   There is no height or weight on file to calculate BMI.  Physical Exam Neurological:     Mental Status: He is alert.     CBC    Component Value Date/Time   WBC 12.3 (H) 06/14/2020 1146   RBC 4.39 06/14/2020 1146   HGB 15.0 06/14/2020 1146   HGB 14.4 09/20/2011 1104   HCT 42.7 06/14/2020 1146   HCT 41.1 09/20/2011 1104   PLT 158 06/14/2020 1146   PLT 135 (L) 09/20/2011 1104   MCV 97.3 06/14/2020 1146   MCV 99 09/20/2011 1104   MCH 34.2 (H) 06/14/2020 1146   MCHC 35.1 06/14/2020 1146   RDW 12.9 06/14/2020 1146   RDW 12.9 09/20/2011 1104   LYMPHSABS 1.0 06/14/2020 1146   LYMPHSABS 1.9 09/20/2011 1104   MONOABS 0.5 06/14/2020 1146  MONOABS 0.4 09/20/2011 1104   EOSABS 0.1 06/14/2020 1146   EOSABS 0.2 09/20/2011 1104   BASOSABS 0.0 06/14/2020 1146   BASOSABS 0.1 09/20/2011 1104    CMP     Component Value Date/Time   NA 137 06/14/2020 1146   NA 142 09/20/2011 1104   K 5.2 (H) 06/14/2020 1146   K 3.8 09/20/2011 1104   CL 101 06/14/2020 1146   CL 107 09/20/2011 1104   CO2 26 06/14/2020 1146   CO2 28 09/20/2011 1104   GLUCOSE 279 (H) 06/14/2020 1146   GLUCOSE 119 (H) 09/20/2011 1104   BUN 18 06/14/2020 1146   BUN 12 09/20/2011 1104   CREATININE 1.20 06/14/2020 1146   CREATININE 0.99 09/20/2011 1104   CALCIUM 9.4 06/14/2020 1146   CALCIUM 8.8 09/20/2011 1104   PROT 7.5 06/14/2020 1146   ALBUMIN 3.9 06/14/2020 1146   AST 55 (H) 06/14/2020 1146   ALT 110 (H) 06/14/2020 1146   ALKPHOS 118 06/14/2020 1146   BILITOT 0.8 06/14/2020 1146   GFRNONAA >60 06/14/2020 1146   GFRNONAA >60 09/20/2011 1104   GFRAA >60 02/02/2020 0817   GFRAA >60 09/20/2011 1104     Assessment and Plan: 1. Rectal cancer ([place])   2. Chronic respiratory failure with hypoxia (HCC)   3. Port-A-Cath in place     Cancer Staging Rectal cancer [facility]) Staging form: Colon and Rectum, AJCC 8th Edition - Pathologic stage from 09/20/2019: Stage IIIB (pT3, pN1a, cM0) - Signed by Earlie Server, MD on 06/16/2020   History of stage III rectal cancer. Patient did not finish the planned adjuvant chemotherapy and concurrent chemoradiation. Labs reviewed and discussed with patient. CEA is trending up to 25. I recommend patient to proceed with CT chest abdomen pelvis for restaging.  Recommend patient to reestablish care with Dr. Baruch Gouty for follow-up.  Chronic respiratory failure with hypoxia secondary to previous COVID-19 infection. Continue follow-up with pulmonology.  Port-A-Cath in place, recommend port flush every 8 weeks. He agrees with the plan.  Follow Up Instructions: To be determined.  Pending CT results.   I discussed the assessment and treatment plan with the patient. The patient was provided an opportunity to ask questions and all were answered. The patient agreed with the plan and demonstrated an understanding of the instructions.  The patient was advised to call back or seek an in-person evaluation if the symptoms worsen or if the condition fails to improve as anticipated.   Earlie Server, MD 06/16/2020 11:23 PM

## 2020-06-16 NOTE — Telephone Encounter (Signed)
Dr. Tasia Catchings would like CT ch/a/pelvis w contrast. Pt is scheduled for CT chest w/o (ordered by Dr. Loanne Drilling). I will call office to check if they want to cancel their scan since Dr. Tasia Catchings is ordering one.   Christina, with pt being schedule already for a CT scan, will this interfere with [place] getting a auth for a CT chest abd pelvis  ? if so, do they need to cancel their CT before we can get auth/schedule?

## 2020-06-19 ENCOUNTER — Other Ambulatory Visit: Payer: Self-pay

## 2020-06-19 ENCOUNTER — Telehealth: Payer: Self-pay | Admitting: Pulmonary Disease

## 2020-06-19 DIAGNOSIS — C20 Malignant neoplasm of rectum: Secondary | ICD-10-CM

## 2020-06-19 NOTE — Telephone Encounter (Signed)
Spoke to [employer] at [employer] and informed her of Dr. Tasia Catchings ordering more extensive CT scan. Asked for a return call to let us know if Dr. Loanne Drilling would like to cancel or keep her CT chest w/o. Will order CT chest ch/a/p once we get a response

## 2020-06-19 NOTE — Telephone Encounter (Signed)
No auth required by this plan.

## 2020-06-19 NOTE — Telephone Encounter (Signed)
Pt does not need 2 CT chest. As long as oncology shares the CT Chest  results with Dr. Loanne Drilling. Thanks

## 2020-06-19 NOTE — Telephone Encounter (Signed)
Please try to contact him tomorrow again. We need to be sure he know about this appt.

## 2020-06-19 NOTE — Telephone Encounter (Signed)
Called and spoke with Garrett Peterson. She is aware that we will cancel our CT. I did ask for the CT to be completed before 07/04/20 since the patient is scheduled to see MR and these results would be discussed. She will relay the message to the schedulers.   PCCs, can you all cancel the CT that was ordered by Dr. Loanne Drilling? Thanks!

## 2020-06-19 NOTE — Telephone Encounter (Signed)
Patient is currently scheduled for a CT chest w/o contrast on 06/23/20. Patient was seen by his oncologist last week, Dr, Tasia Catchings. They have ordered a CT chest, abdomen and pelvis which has not been scheduled yet. Oncologist is wanting to know if patient still needed to have an additional CT chest scan.   I checked [facility] and Dr. Loanne Drilling and she is not available until Thursday.   Judson Roch, can you please advise on behalf of Dr. Loanne Drilling? Thanks!

## 2020-06-19 NOTE — Telephone Encounter (Signed)
FYI... As of 4:13 pm pts 06/23/20 CT Chest WO had not been cx    So,Ct Chest/ABD/PELVIS per Dr.Yu was sched for 06/30/20 I was unable to reach pt by phone. A NEW appt reminder letter will be mailed out making him  Aware of his sched 06/30/20 CT

## 2020-06-19 NOTE — Telephone Encounter (Signed)
  Received call from [place], from Dr. Cordelia Pen office. They will cancel their CT. Please check to see if CT ch/a/pelv can take the spot of the CT chest that they are cancelling on 2/11. If not, please check if CT can be done prior to 2/22. Inform pt of new appts/changes. thanks

## 2020-06-20 LAB — FUNGUS CULTURE WITH STAIN

## 2020-06-20 LAB — FUNGUS CULTURE RESULT

## 2020-06-20 LAB — FUNGAL ORGANISM REFLEX

## 2020-06-20 NOTE — Telephone Encounter (Signed)
Called and informed patient of new CT ch/a/p date 2/18 @ 8:30 and that he does not need to go the one scheduled on 2/11 as it will be cancelled. Pt voiced understanding.

## 2020-06-23 ENCOUNTER — Ambulatory Visit
Admission: RE | Admit: 2020-06-23 | Discharge: 2020-06-23 | Disposition: A | Discharge status: Home / Self Care | Payer: Medicare Other | Source: Ambulatory Visit | Attending: Pulmonary Disease | Admitting: Pulmonary Disease

## 2020-06-23 ENCOUNTER — Other Ambulatory Visit: Payer: Self-pay

## 2020-06-23 DIAGNOSIS — J9611 Chronic respiratory failure with hypoxia: Secondary | ICD-10-CM

## 2020-06-23 DIAGNOSIS — J849 Interstitial pulmonary disease, unspecified: Secondary | ICD-10-CM

## 2020-06-26 ENCOUNTER — Ambulatory Visit: Payer: Medicare Other | Admitting: Radiation Oncology

## 2020-06-26 NOTE — Progress Notes (Signed)
Updated patient on CT results. 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 He reports improved oxygenation which is promising. I advised him to continue his prednisone taper as instructed. Will await further recommendations after he is seen by Dr. Chase Caller in two more weeks.

## 2020-06-30 ENCOUNTER — Ambulatory Visit
Admission: RE | Admit: 2020-06-30 | Discharge: 2020-06-30 | Disposition: A | Discharge status: Home / Self Care | Payer: Medicare Other | Source: Ambulatory Visit | Attending: Oncology | Admitting: Oncology

## 2020-06-30 ENCOUNTER — Other Ambulatory Visit: Payer: Self-pay

## 2020-06-30 ENCOUNTER — Ambulatory Visit
Admission: RE | Admit: 2020-06-30 | Discharge: 2020-06-30 | Disposition: A | Discharge status: Home / Self Care | Payer: Medicare Other | Source: Home / Self Care | Attending: Oncology | Admitting: Oncology

## 2020-06-30 DIAGNOSIS — C20 Malignant neoplasm of rectum: Secondary | ICD-10-CM | POA: Diagnosis not present

## 2020-06-30 MED ORDER — IOHEXOL 300 MG/ML  SOLN
100.0000 mL | Freq: Once | INTRAMUSCULAR | Status: AC | PRN
  Administered 2020-06-30: 100 mL via INTRAVENOUS

## 2020-07-04 ENCOUNTER — Encounter: Payer: Self-pay | Admitting: Internal Medicine

## 2020-07-04 ENCOUNTER — Telehealth: Payer: Self-pay

## 2020-07-04 ENCOUNTER — Telehealth: Payer: Self-pay | Admitting: Internal Medicine

## 2020-07-04 ENCOUNTER — Other Ambulatory Visit: Payer: Self-pay

## 2020-07-04 ENCOUNTER — Ambulatory Visit (INDEPENDENT_AMBULATORY_CARE_PROVIDER_SITE_OTHER): Payer: Medicare Other | Admitting: Internal Medicine

## 2020-07-04 VITALS — BP 114/72 | Temp 97.8°F | Ht 68.0 in | Wt 219.4 lb

## 2020-07-04 DIAGNOSIS — Z01811 Encounter for preprocedural respiratory examination: Secondary | ICD-10-CM

## 2020-07-04 DIAGNOSIS — R5381 Other malaise: Secondary | ICD-10-CM | POA: Diagnosis not present

## 2020-07-04 DIAGNOSIS — J849 Interstitial pulmonary disease, unspecified: Secondary | ICD-10-CM

## 2020-07-04 DIAGNOSIS — J9611 Chronic respiratory failure with hypoxia: Secondary | ICD-10-CM | POA: Diagnosis not present

## 2020-07-04 DIAGNOSIS — Z7952 Long term (current) use of systemic steroids: Secondary | ICD-10-CM

## 2020-07-04 MED ORDER — PREDNISONE 10 MG (21) PO TBPK: ORAL_TABLET | Freq: Every day | ORAL | 0 refills | Status: DC

## 2020-07-04 NOTE — Patient Instructions (Addendum)
ICD-10-CM   1. ILD (interstitial lung disease) ([place])  J84.9 Pulmonary function test    AMB referral to pulmonary rehabilitation  2. Chronic hypoxemic respiratory failure ([facility])  J96.11 Pulmonary function test    AMB referral to pulmonary rehabilitation  3. Physical deconditioning  R53.81   4. Long term systemic steroid user  Z79.52   5. 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 - lot of features for IPF Thought CT is not typicaly  My concern is that it'll be very difficult to reverse what is going on but nevertheless it is worth a try  Second part of the goal is to try to prevent it from getting worse  Third part of the goal would be to help improve your symptoms and quality of life  I touched base with Dr Tasia Catchings your oncologist after you left   Plan -Do interstitial lung disease questionnaire [it takes 30 minutes to fill -leave it at the front desk today or bring it back with you next time]  - will discuss at case conference March 2022    -[place] for room air at rest but need 5L [place] with exertion > 50 feet   - monitor o2 levels at home   -Restart prednisone at 30 mg/day x 2 weeks and then 20 mg/day at 2 weeks and then 10 mg/day at 2 weeks and then 5 mg/day to continue till further notice  -Start paperwork for nintedanib [this medication does have GI side effects of nausea vomiting diarrhea weight loss and making colitis worse -I got clearance for this from your oncologist -but we can start paperwork today on this]  - -Continue other medications including Bactrim  -Okay for radiation to the perineal area for colon cancer   - Ok for colonoscopy for concern for recurrence but best  done in hospital setting with anesthesia (I d/w Dr Talbert Cage)  -I will discuss your scans at a case conference in March 2022 test  -Refer to pulmonary rehabilitation at [facility]  -Other things in the care option including right heart catheterization and clinical trials as a care option to be discussed in the future  -Do spirometry and DLCO in 4-8 weeks    Follow-up -Return to see Dr. Chase Caller or nurse practitioner in 3-4 weeks -30-minute slot -Take a second appointment at this point to see Dr. Chase Caller in 8 weeks 30-minute slot

## 2020-07-04 NOTE — Telephone Encounter (Signed)
-----  Message from Earlie Server, MD sent at 07/03/2020  9:10 PM EST ----- Please let him know that CT showed rectal wall thickening. I am reaching out to his GI to see if repeat colonoscopy and biopsy may be attempted.

## 2020-07-04 NOTE — Telephone Encounter (Signed)
MR please verify the prednisone rx that was sent to the pharmacy.  thanks

## 2020-07-04 NOTE — Progress Notes (Signed)
Hospital admission 03/03/2020-03/04/2020  -  at [facility] for acute hypoxemic respiratory failure Garrett Peterson a 75 y.o.malewith medical history significant ofhypertension, hyperlipidemia, prediabetes, CAD, rectal cancer(s/p resection with primaryre-anastomosis by Dr.Cintronand patient is currently on chemotherapy via right chest port),presented to the hospitalwith shortness of breath.Patient stated thathehad positive COVID-19 test 3 weeks ago (patient and wife deny this to be true on pccm visit 07/04/20), and finished 10-day course oftreatment.Repeat testing was negative. Patient initially felt better but subsequently has started feeling short of breath with dyspnea as well. He has been fully vaccinated for COVID-19 including booster shot. Patient hadoxygen desaturation to79% on ambulation, but improved to 97% on resting.In the ED, patient was found to have a negative Covid PCR. Electrolytes are okay. WBC at 10.0. Chest x-ray showed bilateral patchy infiltrates.  Patient was then considered for observation in the hospital.  Acute hypoxic respiratory failure secondary to recent COVID-19 virus: Hypoxia mostly exertional but at rest saturating very well. Patient appears to be completely comfortable at this time.  Not even on supplemental oxygen.  No increased work of breathing.  Chest appears to be clear.  Chest x-ray with infiltrates which is expected at this time..   Patient will be given prednisone p.o., Robitussin-DM, and will be considered for discharge home today.Flu test was negative as well. Troponin was negative.   Patient was advised against overexertion.    03/20/2020: Office visit with oncology at [facility] dr Tasia Catchings  #Rectal cancer stage III Patient has completed 7 cycles of adjuvant FOLFOX. Repeat CT abdomen pelvis showed no recurrence disease.  CEA has trended up, which can be secondary to lung infection. Hold FOLFOX treatment today. Patient has  been started on radiation treatments.  I discussed with rad Onc Dr. Baruch Gouty who recommends to continue radiation with no interruption at this point.  In that case, I will add low-dose Xeloda 1500 mg twice daily to be used with radiation.  I called patient and discussed with him and he is in agreement.  If he is not able to tolerate, will does reduce or discontinue.  We will look up his coverage of Xeloda I explained to the patient the risks and benefits of chemotherapy including all but not limited to, hair loss, hearing loss, mouth sore, nausea, vomiting, low blood counts, bleeding, heart failure, kidney failure, hand-foot syndrome and risk of life threatening infection and even death, secondary malignancy etc.   Patient voices understanding and willing to proceed chemotherapy.   # Chemotherapy education; port placement. Hopefully the planned start chemotherapy next week. Antiemetics-Zofran and Compazine; EMLA cream sent to pharmacy   #Chronic respiratory failure, hypoxia on home oxygen 4 to 5 L with activity. Patient follows up with Dr. Raul Del and will have a repeat x-ray. I encourage patient to further discuss with pulmonology about the prognosis, anticipated duration of steroid.    #Renal cyst, plan MRI abdomen with and without contrast for further evaluation of the renal cyst.  In the future #Weight loss, weight has been stable. #Port-A-Cath in place, continue port flush every 8 weeks.    03/21/2020: Office visit Dr. Herbert Seta clinic [place]  Here with worsening sob and ground glass infiltrates (extensive). Preceding scan of ILD. ? Developing ipf flare, ? covid lung ( test have all been negative),? pneumonia on the right, doubt chf. W/u in progress.  -oxygen at 4-5 liters with activity. -increase prednisone to 30 mg q day -trial levaquin 500 mg q day  -per above ? Bronchoscopy -follow up  in one week -BNP -f/u in 10 days       ANA Direct - [facility] Negative Negative    RA Latex Turbid. - [facility] 0.0 - 13.9 IU/mL <10.0   A.Fumigatus #1 Abs - [facility] Negative Negative  Micropolyspora faeni, IgG - [facility] Negative Negative  Thermoactinomyces vulgaris, IgG - [facility] Negative Negative  A. Pullulans Abs - [facility] Negative Negative  Thermoact. Saccharii - [facility] Negative Negative  Pigeon Serum Abs - [facility] Negative Negative   Angio Convert Enzyme - [facility] 14 - 82 U/L 26   C Reactive Protein - [facility] 0 - 10 mg/L 5   IMPRESSION:  Extensive ground-glass airspace opacities throughout the lungs,  worsening since prior study, most pronounced in the mid and lower  lung zones. This is most concerning for infectious or inflammatory  process. COVID pneumonia could have this appearance or inflammatory  fibrosis following COVID pneumonia. Other inflammatory infectious  processes are also possible.   Trace left effusion.   Aortic atherosclerosis.       04/10/2020: Office visit at [facility] clinic in [place] by Dr. Raul Del pulmonary  Here with worsening sob and ground glass infiltrates (extensive). Preceding scan of ILD. ? Developing ipf flare, ? covid lung ( test have all been negative),?pneumonia on the right, doubtchf. Has had his covid booster shor. W/u in progress. Sob is getting worse -oxygen at4-5 liters with activity. -prednisone to 30 mg q day -Bronchoscopy vs OLB ( will discuss with the team)  -follow up in one week --f/u in 14 ayswith cxr.  Consult on May 11, 2020 with Dr Lanney Gins at [place] [facility] in [facility]  Mr. Duell Holdren is 75 y.o. male who was referred to [facility] clinic due to   Patient did have airway inspection with bronchoscopy as well as bronchoalveolar lavage this was negative for PCP via PCR, and aerobic cultures were done which were negative as well as aerobic cultures were done on BAL which is also negative, Aspergillus testing was done on bronchoalveolar fluid which was also negative and  acid-fast smear and culture was done which are both negative. Cytology was also collected and was negative for atypia.  He has been on prednisone for appx 2 months now - 30m daily , we will step down to 250m   He does cough with phlegm production, we discussed bringing in additional resp culture.   DIAGNOSIS:  A. RIGHT LUNG; LAVAGE: [facility] on [facility], [facility].  - REACTIVE BRONCHIAL EPITHELIUM AND ACUTE INFLAMMATION.   GROSS DESCRIPTION:  A. Site: Right lung  Procedure: Bronchoscopy  Cytology specimen: Lavage  Cytotechnologist(s): JaMolli BarrowsAshlee Howze-Soremekun  Specimen material collected and submitted for:  0 diff Quik stained slides  0 Pap stained slides  Specimen material submitted for: Cell block and ThinPrep   The cell block material is fixed in formalin for 6 hours   cycle is every 14 days: Oxaliplatin  Leucovorin  Fluorouracil  Fluorouracil   **Always confirm dose/schedule in your pharmacy ordering system**  REASON: Continuation Of Treatment PRIOR TREATMENT: ROS56: mFOLFOX6 q14 Days x 4 Months TREATMENT RESPONSE: Stable Disease (SD)  START ON PATHWAY REGIMEN - Colorectal   Administer Monday through Friday: Capecitabine   **Always confirm dose/schedule in your pharmacy ordering system**  Patient Characteristics: Postoperative without Neoadjuvant Therapy (Pathologic Staging), Rectal, pT4, pN0 or Any pT, pN+ Tumor Location: Rectal Therapeutic Status: Postoperative without Neoadjuvant Therapy (Pathologic Staging) AJCC M Category: cM0 AJCC T Category: pT3  AJCC N Category: pN1 AJCC 8 Stage Grouping: IIIB Intent of Therapy: Curative Intent, Discussed with Patient  As per up-to-date dyspnea occurs in 13% of patients on Oxaliplatin and numerous reports of pulmonary interstitial fibrosis.+-  Past medical History: He has a past medical history of COVID-19, History of pneumonia, adenomatous colonic  polyps (07/15/2018), Hyperlipidemia (07/15/2018), Hypertension, and Respiratory failure with hypoxia (CMS-HCC).  has No Known Allergies.   Plan  Acute hypoxemic respiratory failure - patient has been on FOLFOX - there is reported dyspnea (13%), and reports of interstitial infiltrates and fibrosis. This is rare but with negative infectious workup we should consider inflammatory component.  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 Bronchoscopy was performed during hospital admission which showed cell count from BAL with mixed picture- 88 cells total, 4% eos, 35% neutrophil, 32% monocyte-macrophage, 29% lymphs. Culture showed NGTD. TTE results did not show any evidence of shunt that may be contributing to hypoxia. Pulmonology recommended high dose steroids, 60 mg prednisone for one month and then to taper. Continued bactrim 400-72m BID. Plan to follow up with pulmonology and ILD clinic.   1.  Chronic hypoxic respiratory failure- follow up with pulmonology regarding initiation of high dose prednisone, 60 mg for one month followed with taper.    Echo 05/18/20 . Image acquisition challenging due to patient body habitus.  Delayed to access port that had clotted off.  IMPRESSIONS    1. Negative bubble study.  2. Left ventricular ejection fraction, by estimation, is 65 to 70%. The  left ventricle has normal function. The left ventricle has no regional  wall motion abnormalities. There is moderate concentric left ventricular  hypertrophy. Left ventricular  diastolic parameters  are consistent with Grade I diastolic dysfunction  (impaired relaxation).  3. Right ventricular systolic function is mildly reduced. The right  ventricular size is mildly enlarged. There is normal pulmonary artery  systolic pressure.  4. The mitral valve is normal in structure. Mild mitral valve  regurgitation. No evidence of mitral stenosis.  5. The aortic valve is normal in structure. Aortic valve regurgitation is  not visualized. No aortic stenosis is present.  6. The inferior vena cava is normal in size with greater than 50%  respiratory variability, suggesting right atrial pressure of 3 mmHg.   ov 06/06/20  Chief Complaint  Patient presents with  . Hospitalization Follow-up    Here for HFU--1/4-1/7 hospital stay with bronch and ct done.  SHOB is about the same.       Reason for Visit: Hospital follow-up   Mr. 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  Pulmonary was consulted during his admission from 1/4-1/7. He had previously underwent bronchoscopy on 03/14/20 [place] Dr[place] results were negative for cultures and cytology. Prior to admission he was on prednisone for 2 months and started on bactrim the week prior. His CT demonstrated progressive GGO  so he was advised for admission. When seen at [facility], he underwent second bronchoscopy with BAL on 05/18/20 which demonstrated mixed cellularity, neg cultures, neg cytology. His steroids were increased to 60 mg daily with plan for slow taper.  He reports since discharge that his breathing is unchanged despite steroids and feels his quality of life is poor. He ambulates within the house and motivates himself to stay active but it is not at the level he was prior. Dyspnea worsens with exertion and improves with rest. Denies cough and  wheezing. He is completed 7 out of 8 of chemo treatments and not planned for any further infusions due to his respiratory issues.  Social History: Never smoker  PLAN --Recommend continuing to hold chemotherapy --Decrease prednisone to 30 mg daily x 2 week, followed by 20 mg until seen in clinic --Order CT Chest without contrast on 06/26/20 --Follow-up scheduled with Dr. Chase Caller on 07/04/20    OV 07/04/2020  Subjective:  Patient ID: [name], male , DOB: 1945/06/19 , age 80 y.o. , MRN: [id number] , ADDRESS: [place] [id number] PCP Albina Billet, MD Patient Care Team: Albina Billet, MD as PCP - General (Internal Medicine) Clent Jacks, RN as Oncology Nurse Navigator Earlie Server, MD as Consulting Physician (Oncology)  This Provider for this visit: Treatment Team:  Attending Provider: Brand Males, MD    07/04/2020 -referred to the ILD center for comprehensive evaluation by Dr. Rodman Pickle primary pulmonologist Chief Complaint  Patient presents with  . Follow-up    Chest congestion     HPI MASSIMO HARTLAND 75 y.o. -very complicated story.  Details of the story is copied and pasted above.  Meeting him for the first time with his wife.  It appears that he was previously healthy as of April 2021.  Although CT scan of the chest at the time of my personal visualization showed presence of early ILD [CT abdomen 10 years prior in 2011 had no ILD].  He was diagnosed with anorectal cancer.  He was undergoing treatment at [facility].  Chemotherapy as above.  Then in the fall/winter 2020 when he started falling ill with respiratory illnesses.  This is all documented above.  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  CT scan of the chest by October 2021 started showing significant amount of groundglass opacities.  He only had ANA test, rheumatoid factor and angiotensin-converting enzyme.  This is all negative.  For serology.  Otherwise has not had any other  serology.  He had hypersensitive pneumonitis panel this was negative.  This was at outside facility [facility] clinic pulmonology program.  He has had 2 hospitalizations the most recent one being in January.  It appears that he was treated with prednisone each time.  It is unclear to me as this to him and his wife if he has been on chronic prednisone but reading the notes it appears so.  Currently is just finished a taper starting at 60 mg/day in January and finished at 20 mg and stop.  He says since stopping yesterday or today he is beginning to feel a little bit worse.  Room air oxygen at rest was fine but when he desaturated after he walked more than 2 laps.  However he needed 5 L to correct.  Most recent echocardiogram was normal.  Most recent lavage showed mostly neutrophils.  Again culture negative.  His most recent CT scan of the chest in February 2022 without contrast shows  improvement in groundglass opacities but no fibrosis.  His serologies ANA negative and rheumatoid factor negative and angiotensin converting enzyme negative  Definitely earlier on in the year it was not consistent with UIP and suggested alternate diagnosis.  Unclear to me if he has probable UIP pattern at this point in time in February 2022.  We did not have time to do an extensive detailed ILD questionnaire.  It appears that his goals are to get better and also feel better at the same time control his cancer.  They are also wondering about prognosis.  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  He is currently looking into getting radiation locally.  Oncologist wants to do a colonoscopy.  She feels the recurrence is because all his chemo has been on hold because of respiratory issues.  SYMPTOM SCALE - ILD 07/04/2020   O2 use 4L   Shortness of Breath 0 -> 5 scale with 5  being worst (score 6 If unable to do)  At rest 1  Simple tasks - showers, clothes change, eating, shaving 4  Household (dishes, doing bed, laundry) x  Shopping x  Walking level at own pace 3  Walking up Stairs 4  Total (30-36) Dyspnea Score 12  How bad is your cough? 0  How bad is your fatigue 4  How bad is nausea 0  How bad is vomiting?  0  How bad is diarrhea? 0  How bad is anxiety? 5  How bad is depression 5    [facility] Dec 2021 - [name]  ANA Direct - [facility] Negative Negative   RA Latex Turbid. - [facility] 0.0 - 13.9 IU/mL <10.0   A.Fumigatus #1 Abs - [facility] Negative Negative  Micropolyspora faeni, IgG - [facility] Negative Negative  Thermoactinomyces vulgaris, IgG - [facility] Negative Negative  A. Pullulans Abs - [facility] Negative Negative  Thermoact. Saccharii - [facility] Negative Negative  Pigeon Serum Abs - [facility] Negative Negative   Angio Convert Enzyme - [facility] 14 - 82 U/L 26   Walk tst 07/04/2020    94% - ra at rest -> walked 2 laps without desat and dropped to 86% in middle of 3rd lap. Then need 5L [place] to correct to walk 3 laps in office   CT chest 06/23/20   IMPRESSION: 1. 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 Fibrosis has progressed since 03/02/2020 chest CT with resolved consolidative opacities. 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 UIP is difficult to exclude but is less favored. Follow-up high-resolution chest CT suggested in 6-12 months. Findings are indeterminate for UIP per consensus guidelines: Diagnosis of Idiopathic Pulmonary Fibrosis: An Official ATS/ERS/JRS/ALAT Clinical Practice Guideline. [place], Iss 5,  [id number], Jan 11 2017. 2. Stable mild cardiomegaly. 3. Aortic Atherosclerosis (ICD10-I70.0).   Electronically Signed   By: [name] M.D.   On: 06/24/2020 15:15       CT Abd 06/30/20   IMPRESSION: 1. Surgical sutures at the rectosigmoid junction. Development of irregular rectal wall thickening, suspicious for locally recurrent disease. 2. No findings of metastatic disease within the chest, abdomen, or pelvis. 3.  Interstitial lung disease, as on 06/24/2020 high-resolution chest CT. Likely post infectious/inflammatory fibrosis. 4. Lower pole left renal lesion is technically indeterminate but favored to represent a complex cyst. 5.  Aortic and branch vessel atherosclerosis.   Electronically Signed   By: Abigail Miyamoto M.D.   On: 06/30/2020 11:00   has a past medical history of Actinic keratosis, Adenomatous polyp of colon, Arthritis, Basal cell carcinoma, CAD (coronary artery disease), Cancer ([place]) (2021), Hyperlipidemia, Pneumonia, Pre-diabetes, and Respiratory failure with hypoxia ([place]).   reports that he has never smoked. He has quit using smokeless tobacco.  His smokeless tobacco use included chew.  Past Surgical History:  Procedure Laterality Date  . APPENDECTOMY    . BRONCHIAL WASHINGS  05/18/2020   Procedure: BRONCHIAL WASHINGS;  Surgeon: Margaretha Seeds, MD;  Location: [facility] ENDOSCOPY;  Service: Pulmonary;;  . CARDIAC CATHETERIZATION     PT DENIES  . COLONOSCOPY     2011,2015,2021  . CORONARY ARTERY BYPASS GRAFT    . FLEXIBLE BRONCHOSCOPY N/A 04/24/2020   Procedure: FLEXIBLE BRONCHOSCOPY;  Surgeon: Ottie Glazier, MD;  Location: [facility] ORS;  Service: Thoracic;  Laterality: N/A;  . IR SINUS/FIST TUBE CHK-NON GI  10/27/2019  . open heart surgery    . PORTACATH PLACEMENT Right 10/13/2019   Procedure: INSERTION PORT-A-CATH;  Surgeon: Herbert Pun, MD;  Location: [facility] ORS;  Service: General;  Laterality: Right;  . ROBOT ASSISTED LAPAROSCOPIC PARTIAL  COLECTOMY  09/2019  . VIDEO BRONCHOSCOPY N/A 05/18/2020   Procedure: VIDEO BRONCHOSCOPY WITHOUT FLUORO;  Surgeon: Margaretha Seeds, MD;  Location: [place];  Service: Pulmonary;  Laterality: N/A;    No Known Allergies  Immunization History  Administered Date(s) Administered  . PFIZER(Purple Top)SARS-COV-2 Vaccination 01/18/2020  . Zoster Recombinat (Shingrix) 01/12/2018, 03/21/2018    Family History  Problem Relation Age of Onset  . Lung disease Father      Current Outpatient Medications:  .  predniSONE (STERAPRED UNI-PAK 21 TAB) 10 MG (21) TBPK tablet, Take by mouth daily. Take 54m for 2 weeks, 284mfor 2 weeks then 1019mor 2 weeks then 5mg61mily until further notice., Disp: 14 tablet, Rfl: 0 .  albuterol (VENTOLIN HFA) 108 (90 Base) MCG/ACT inhaler, Inhale 2 puffs into the lungs every 6 (six) hours as needed for wheezing or shortness of breath. (Patient not taking: No sig reported), Disp: 1 each, Rfl: 0 .  capecitabine (XELODA) 500 MG tablet, Take 3 tablets (1,500 mg total) by mouth 2 (two) times daily after a meal. Take Monday through Friday during radiation. (Patient not taking: No sig reported), Disp: 126 tablet, Rfl: 0 .  dextromethorphan-guaiFENesin (MUCINEX DM) 30-600 MG 12hr tablet, Take 1 tablet by mouth 2 (two) times daily as needed for cough. (Patient not taking: No sig reported), Disp: 60 tablet, Rfl: 0 .  lidocaine-prilocaine (EMLA) cream, Apply to affected area once (Patient not taking: No sig reported), Disp: 30 g, Rfl: 3 .  metoprolol tartrate (LOPRESSOR) 25 MG tablet, Take 25 mg by mouth 2 (two) times daily., Disp: , Rfl:  .  sertraline (ZOLOFT) 25 MG tablet, Take 1 tablet (25 mg total) by mouth daily. (Patient not taking: No sig reported), Disp: 30 tablet, Rfl: 0 .  sulfamethoxazole-trimethoprim (BACTRIM) 400-80 MG tablet, Take 1 tablet by mouth every 12 (twelve) hours., Disp: 60 tablet, Rfl: 0 .  temazepam (RESTORIL) 30 MG capsule, Take 30 mg by mouth at bedtime.  , Disp: , Rfl:       Objective:   Vitals:  07/04/20 1401  BP: 114/72  Temp: 97.8 F (36.6 C)  TempSrc: Oral  SpO2: 93%  Weight: 219 lb 6.4 oz (99.5 kg)  Height: 5' 8" (1.727 m)    Estimated body mass index is 33.36 kg/m as calculated from the following:   Height as of this encounter: 5' 8" (1.727 m).   Weight as of this encounter: 219 lb 6.4 oz (99.5 kg).  _0 @  [employer]   07/04/20 1401  Weight: 219 lb 6.4 oz (99.5 kg)     Physical Exam  General Appearance:    Alert, cooperative, no distress, appears stated age - older , Deconditioned looking - yes , OBESE  - cushingoid, Sitting on Wheelchair -  yes  Head:    Normocephalic, without obvious abnormality, atraumatic  Eyes:    PERRL, conjunctiva/corneas clear,  Ears:    Normal TM's and external ear canals, both ears  Nose:   Nares normal, septum midline, mucosa normal, no drainage    or sinus tenderness. OXYGEN ON  - yes . Patient is @ 4L [place]   Throat:   Lips, mucosa, and tongue normal; teeth and gums normal. Cyanosis on lips - no  Neck:   Supple, symmetrical, trachea midline, no adenopathy;    thyroid:  no enlargement/tenderness/nodules; no carotid   bruit or JVD  Back:     Symmetric, no curvature, ROM normal, no CVA tenderness  Lungs:     Distress - no , Wheeze no, Barrell Chest - no, Purse lip breathing - no, Crackles - yes at bse   Chest Wall:    No tenderness or deformity.    Heart:    Regular rate and rhythm, S1 and S2 normal, no rub   or gallop, Murmur - no  Breast Exam:    NOT DONE  Abdomen:     Soft, non-tender, bowel sounds active all four quadrants,    no masses, no organomegaly. Visceral obesity - yes  Genitalia:   NOT DONE  Rectal:   NOT DONE  Extremities:   Extremities - normal, Has Cane - no, Clubbing - no, Edema - no  Pulses:   2+ and symmetric all extremities  Skin:   Stigmata of Connective Tissue Disease - no  Lymph nodes:   Cervical, supraclavicular, and axillary nodes normal   Psychiatric:  Neurologic:   Pleasant - yes, Anxious - yes, Flat affect - no  CAm-ICU - neg, Alert and Oriented x 3 - yes, Moves all 4s - yes, Speech - normal, Cognition - intact        Assessment:       ICD-10-CM   1. ILD (interstitial lung disease) ([place])  J84.9 Pulmonary function test    AMB referral to pulmonary rehabilitation  2. Chronic hypoxemic respiratory failure (HCC)  J96.11 Pulmonary function test    AMB referral to pulmonary rehabilitation  3. Physical deconditioning  R53.81   4. 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  I will discuss his scans at case conference coming up in March 2022.  I will also have him do an ILD questionnaire.  This will then help [place] narrow and the specific type of ILD.  I think a surgical lung biopsy would be the best tool to understand his ILD but given all his issues it might be risky.  We will just work with the  data that we have    Based on current description of CT scan I think there is very little hope for reversibility.  Nevertheless it appears some of his hypoxemia might be better.  Therefore it is worth continuing with a prednisone taper which have reinitiated.  Definitely the next goal is to try to prevent things from getting worse therefore we discussed antifibrotic's.  We will go with nintedanib because this more literature and the non-- IPF usage.  I discussed with his oncologist given the concern for colon cancer and colitis.  She feels that there is no absolute contraindication.  She feels it is okay to use nintedanib in the setting of close monitoring.  I did explain to her about the side effect profile of nintedanib.  Therefore we will start here.  If this fails then we can try to get pirfenidone which is a little more difficult with insurance company  We will also get him to pulmonary rehabilitation for his  deconditioning  In terms of preoperative respiratory evaluation: I have advised his oncologist that it is okay for him to have colonoscopy but in a hospital setting with anesthesia support.  I have communicated this with his wife.   Plan:     Patient Instructions     ICD-10-CM   1. ILD (interstitial lung disease) ([facility])  J84.9 Pulmonary function test    AMB referral to pulmonary rehabilitation  2. Chronic hypoxemic respiratory failure (HCC)  J96.11 Pulmonary function test    AMB referral to pulmonary rehabilitation  3. Physical deconditioning  R53.81   4. 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 - lot of features for IPF Thought CT is not typicaly  My concern is that it'll be very difficult to reverse what is going on but nevertheless it is worth a try  Second part of the goal is to try to prevent it from getting worse  Third part of the goal would be to help improve your symptoms and quality of life  I touched base with Dr Tasia Catchings your oncologist after you left   Plan -Do interstitial lung disease questionnaire [it takes 30 minutes to fill -leave it at the front desk today or bring it back with you next time]  - will discuss at case conference March 2022    -[place] for room air at rest but need 5L [place] with exertion > 50 feet   - monitor o2 levels at home   -Restart prednisone at 30 mg/day x 2 weeks and then 20 mg/day at 2 weeks and then 10 mg/day at 2 weeks and then 5 mg/day to continue till further notice  -Start paperwork for nintedanib [this medication does have GI side effects of nausea vomiting diarrhea weight loss and making colitis worse -I got clearance for this  from your oncologist -but we can start paperwork today on this]  - -Continue other medications including Bactrim  -Okay for radiation to the perineal area for colon cancer   - Ok for colonoscopy for concern for recurrence but best done in hospital setting with anesthesia (I d/w Dr Talbert Cage)  -I will discuss your scans at a case conference in  March 2022 test  -Refer to pulmonary rehabilitation at [facility]  -Other things in the care option including right heart catheterization and clinical trials as a care option to be discussed in the future  -Do spirometry and DLCO in 4-8 weeks    Follow-up -Return to see Dr. Chase Caller or nurse practitioner in 3-4 weeks -30-minute slot -Take a second appointment at this point to see Dr. Chase Caller in 8 weeks 30-minute slot  ( Level 05 visit: Estb 40-54 min in  visit type: on-site physical face to visit  in total care time and counseling or/and coordination of care by this undersigned MD - Dr Brand Males. This includes one or more of the following on this same day 07/04/2020: pre-charting, chart review, note writing, documentation discussion of test results, diagnostic or treatment recommendations, prognosis, risks and benefits of management options, instructions, education, compliance or risk-factor reduction. It excludes time spent by the [place] or office staff in the care of the patient. Actual time 120 min)    SIGNATURE    Dr. Brand Males, M.D., F.C.C.P,  Pulmonary and Critical Care Medicine Staff Physician, [place] Director - Interstitial Lung Disease  Program  Pulmonary [place] [facility] [place], [place], [address]  Pager: [phone], If no answer or between  15:00h - 7:00h: call [phone] Telephone: [phone]  6:24 PM 07/04/2020

## 2020-07-04 NOTE — Telephone Encounter (Signed)
Left message for patient to return call.

## 2020-07-05 ENCOUNTER — Other Ambulatory Visit
Admission: RE | Admit: 2020-07-05 | Discharge: 2020-07-05 | Disposition: A | Discharge status: Home / Self Care | Payer: Medicare Other | Source: Ambulatory Visit | Attending: Internal Medicine | Admitting: Internal Medicine

## 2020-07-05 DIAGNOSIS — Z20822 Contact with and (suspected) exposure to covid-19: Secondary | ICD-10-CM | POA: Insufficient documentation

## 2020-07-05 DIAGNOSIS — Z01812 Encounter for preprocedural laboratory examination: Secondary | ICD-10-CM | POA: Diagnosis present

## 2020-07-05 NOTE — Telephone Encounter (Signed)
Patient came by office today and was given results.

## 2020-07-05 NOTE — Telephone Encounter (Signed)
Call returned to pharmacy, confirmed patient DOB. Prednisone information given. Voiced understanding.   Nothing further needed at this time.

## 2020-07-05 NOTE — Telephone Encounter (Signed)
Restart prednisone at 30 mg/day x 2 weeks and then 20 mg/day at 2 weeks and then 10 mg/day at 2 weeks and then 5 mg/day to continue till further notice

## 2020-07-06 ENCOUNTER — Telehealth: Payer: Self-pay | Admitting: Pharmacy Technician

## 2020-07-06 DIAGNOSIS — Z5181 Encounter for therapeutic drug level monitoring: Secondary | ICD-10-CM

## 2020-07-06 LAB — SARS CORONAVIRUS 2 (TAT 6-24 HRS): SARS Coronavirus 2: NEGATIVE

## 2020-07-06 NOTE — Telephone Encounter (Signed)
Received New start paperwork for OFEV 173m. Will update as we work through the benefits process.  [facility] [place] [facility]. Will update once we receive a response.   Key: [id number] Case ID: [id number]

## 2020-07-07 ENCOUNTER — Encounter: Payer: Self-pay | Admitting: Internal Medicine

## 2020-07-07 NOTE — Telephone Encounter (Signed)
Ran test claim, patient's copay for 1 month is $1,776.91. Called patient and advised that income documents will be required for application and program will likely call or send a letter. Advised patient he could drop documents off to office for submission or send directly to the program.  Patient states he would like to wait to hear form the program and will send directly to them.

## 2020-07-07 NOTE — Telephone Encounter (Signed)
Received notification from [facility] regarding a prior authorization for [place]. Authorization has been APPROVED from 07/06/20 to 05/12/21.   Authorization # [id number]  Faxed in patient's BI Cares Application. Awaiting response.

## 2020-07-09 LAB — ACID FAST CULTURE WITH REFLEXED SENSITIVITIES (MYCOBACTERIA): Acid Fast Culture: NEGATIVE

## 2020-07-10 ENCOUNTER — Ambulatory Visit
Admission: RE | Admit: 2020-07-10 | Discharge: 2020-07-10 | Disposition: A | Discharge status: Home / Self Care | Payer: Medicare Other | Attending: Internal Medicine | Admitting: Internal Medicine

## 2020-07-10 ENCOUNTER — Ambulatory Visit: Payer: Medicare Other | Admitting: Dermatology

## 2020-07-10 ENCOUNTER — Encounter
Admission: RE | Disposition: A | Discharge status: Home / Self Care | Payer: Self-pay | Source: Home / Self Care | Attending: Internal Medicine

## 2020-07-10 ENCOUNTER — Ambulatory Visit: Payer: Medicare Other | Admitting: Anesthesiology

## 2020-07-10 DIAGNOSIS — K641 Second degree hemorrhoids: Secondary | ICD-10-CM | POA: Diagnosis not present

## 2020-07-10 DIAGNOSIS — R933 Abnormal findings on diagnostic imaging of other parts of digestive tract: Secondary | ICD-10-CM | POA: Diagnosis present

## 2020-07-10 DIAGNOSIS — Z85828 Personal history of other malignant neoplasm of skin: Secondary | ICD-10-CM | POA: Insufficient documentation

## 2020-07-10 DIAGNOSIS — Z7952 Long term (current) use of systemic steroids: Secondary | ICD-10-CM | POA: Diagnosis not present

## 2020-07-10 DIAGNOSIS — Z79899 Other long term (current) drug therapy: Secondary | ICD-10-CM | POA: Diagnosis not present

## 2020-07-10 DIAGNOSIS — Z9049 Acquired absence of other specified parts of digestive tract: Secondary | ICD-10-CM | POA: Diagnosis not present

## 2020-07-10 DIAGNOSIS — D122 Benign neoplasm of ascending colon: Secondary | ICD-10-CM | POA: Diagnosis not present

## 2020-07-10 DIAGNOSIS — K6289 Other specified diseases of anus and rectum: Secondary | ICD-10-CM | POA: Diagnosis not present

## 2020-07-10 DIAGNOSIS — Z9221 Personal history of antineoplastic chemotherapy: Secondary | ICD-10-CM | POA: Diagnosis not present

## 2020-07-10 DIAGNOSIS — K5730 Diverticulosis of large intestine without perforation or abscess without bleeding: Secondary | ICD-10-CM | POA: Diagnosis not present

## 2020-07-10 DIAGNOSIS — Z980 Intestinal bypass and anastomosis status: Secondary | ICD-10-CM | POA: Insufficient documentation

## 2020-07-10 DIAGNOSIS — Z85048 Personal history of other malignant neoplasm of rectum, rectosigmoid junction, and anus: Secondary | ICD-10-CM | POA: Diagnosis not present

## 2020-07-10 HISTORY — DX: Essential (primary) hypertension: I10

## 2020-07-10 HISTORY — PX: COLONOSCOPY WITH PROPOFOL: SHX5780

## 2020-07-10 SURGERY — COLONOSCOPY WITH PROPOFOL
Anesthesia: General

## 2020-07-10 MED ORDER — PROPOFOL 500 MG/50ML IV EMUL
INTRAVENOUS | Status: DC | PRN
  Administered 2020-07-10: 200 ug/kg/min via INTRAVENOUS

## 2020-07-10 MED ORDER — PROPOFOL 10 MG/ML IV BOLUS
INTRAVENOUS | Status: DC | PRN
  Administered 2020-07-10: 50 mg via INTRAVENOUS

## 2020-07-10 MED ORDER — SODIUM CHLORIDE 0.9 % IV SOLN: INTRAVENOUS | Status: DC

## 2020-07-10 NOTE — Anesthesia Postprocedure Evaluation (Signed)
Anesthesia Post Note  Patient: Garrett Peterson  Procedure(s) Performed: COLONOSCOPY WITH PROPOFOL (N/A )  Patient location during evaluation: Endoscopy Anesthesia Type: General Level of consciousness: awake and alert Pain management: pain level controlled Vital Signs Assessment: post-procedure vital signs reviewed and stable Respiratory status: spontaneous breathing, nonlabored ventilation, respiratory function stable and patient connected to nasal cannula oxygen Cardiovascular status: blood pressure returned to baseline and stable Postop Assessment: no apparent nausea or vomiting Anesthetic complications: no   No complications documented.   Last Vitals:  Vitals:   07/10/20 1345 07/10/20 1355  BP: 134/75   Pulse: (!) 41 78  Resp: (!) 26 (!) 24  Temp:    SpO2: 100% 100%    Last Pain:  Vitals:   07/10/20 1355  TempSrc:   PainSc: 0-No pain                 Arita Miss

## 2020-07-10 NOTE — Anesthesia Preprocedure Evaluation (Signed)
Anesthesia Evaluation  Patient identified by MRN, date of birth, ID band Patient awake    Reviewed: Allergy & Precautions, NPO status , Patient's Chart, lab work & pertinent test results  History of Anesthesia Complications Negative for: history of anesthetic complications  Airway Mallampati: II  TM Distance: >3 FB Neck ROM: Full    Dental  (+) Teeth Intact, Dental Advisory Given   Pulmonary shortness of breath and Long-Term Oxygen Therapy, neg sleep apnea, neg COPD, Patient abstained from smoking.Not current smoker,  Pulmonary fibrosis , 2/2 chemo/radiation for colon cancer? On home O2 4L/min   breath sounds clear to auscultation       Cardiovascular Exercise Tolerance: Good METShypertension, Pt. on home beta blockers + CAD and + CABG  (-) Past MI (-) dysrhythmias  Rhythm:Regular Rate:Normal  Unremarkable TTE   Neuro/Psych negative neurological ROS  negative psych ROS   GI/Hepatic negative GI ROS, Neg liver ROS, neg GERD  ,  Endo/Other  negative endocrine ROSneg diabetes  Renal/GU negative Renal ROS     Musculoskeletal  (+) Arthritis ,   Abdominal Normal abdominal exam  (+)   Peds  Hematology negative hematology ROS (+)   Anesthesia Other Findings Past Medical History: No date: Actinic keratosis No date: Adenomatous polyp of colon No date: Arthritis No date: Basal cell carcinoma No date: CAD (coronary artery disease) 2021: Cancer (HCC)     Comment:  rectal No date: Hyperlipidemia No date: Hypertension No date: Pneumonia No date: Pre-diabetes No date: Respiratory failure with hypoxia (HCC)  Reproductive/Obstetrics                             Anesthesia Physical  Anesthesia Plan  ASA: II  Anesthesia Plan: General   Post-op Pain Management:    Induction: Intravenous  PONV Risk Score and Plan: 2 and Ondansetron, Propofol infusion and TIVA  Airway Management Planned: Nasal  Cannula  Additional Equipment: None  Intra-op Plan:   Post-operative Plan:   Informed Consent: I have reviewed the patients History and Physical, chart, labs and discussed the procedure including the risks, benefits and alternatives for the proposed anesthesia with the patient or authorized representative who has indicated his/her understanding and acceptance.     Dental advisory given  Plan Discussed with: CRNA and Surgeon  Anesthesia Plan Comments: (Discussed risks of anesthesia with patient, including possibility of difficulty with spontaneous ventilation under anesthesia necessitating airway intervention, PONV, and rare risks such as cardiac or respiratory or neurological events. Patient understands.)        Anesthesia Quick Evaluation

## 2020-07-10 NOTE — Interval H&P Note (Signed)
History and Physical Interval Note:  07/10/2020 12:26 PM  Garrett Peterson  has presented today for surgery, with the diagnosis of PERS HX COLON CA ABN CT SCAN.  The various methods of treatment have been discussed with the patient and family. After consideration of risks, benefits and other options for treatment, the patient has consented to  Procedure(s): COLONOSCOPY WITH PROPOFOL (N/A) as a surgical intervention.  The patient's history has been reviewed, patient examined, no change in status, stable for surgery.  I have reviewed the patient's chart and labs.  Questions were answered to the patient's satisfaction.     [place], [place]

## 2020-07-10 NOTE — Op Note (Signed)
[facility] Gastroenter[facility] Patient Name: Garrett Peterson Procedure Date: 07/10/2020 12:59 PM MRN: [id number] Account #: [id number] Date of Birth: 04-08-1946 Admit Type: Outpatient Age: 75 Room: [facility] ENDO ROOM 2 Gender: Male Note Status: Finalized Procedure:             Colonoscopy Indications:           High risk colon cancer surveillance: Personal history                         of rectal cancer Providers:             Benay Pike. Alice Reichert MD, MD Referring MD:          Leona Carry. Hall Busing, MD (Referring MD) Medicines:             Propofol per Anesthesia Complications:         No immediate complications. Procedure:             Pre-Anesthesia Assessment:                        - The risks and benefits of the procedure and the                         sedation options and risks were discussed with the                         patient. All questions were answered and informed                         consent was obtained.                        - Patient identification and proposed procedure were                         verified prior to the procedure by the nurse. The                         procedure was verified in the procedure room.                        - ASA Grade Assessment: III - A patient with severe                         systemic disease.                        - After reviewing the risks and benefits, the patient                         was deemed in satisfactory condition to undergo the                         procedure.                        After obtaining informed consent, the colonoscope was                         passed under direct vision. Throughout the  procedure,                         the patient's blood pressure, pulse, and oxygen                         saturations were monitored continuously. The                         Colonoscope was introduced through the anus and                         advanced to the the cecum, identified by appendiceal                          orifice and ileocecal valve. The colonoscopy was                         performed without difficulty. The patient tolerated                         the procedure well. The quality of the bowel                         preparation was good. The ileocecal valve, appendiceal                         orifice, and rectum were photographed. Findings:      The perianal and digital rectal examinations were normal. Pertinent       negatives include normal sphincter tone and no palpable rectal lesions.      Non-bleeding internal hemorrhoids were found during retroflexion. The       hemorrhoids were Grade II (internal hemorrhoids that prolapse but reduce       spontaneously).      Anal papilla(e) were hypertrophied.      There was evidence of a prior functional end-to-end colo-colonic       anastomosis in the proximal rectum. This was patent and was       characterized by healthy appearing mucosa, an intact appearance and       visible sutures. The anastomosis was traversed. Biopsies were taken with       a cold forceps for histology.      Multiple small and large-mouthed diverticula were found in the sigmoid       colon and descending colon. There was no evidence of diverticular       bleeding.      A 7 mm polyp was found in the proximal ascending colon. The polyp was       sessile. The polyp was removed with a hot snare. Resection and retrieval       were complete.      The exam was otherwise without abnormality. Impression:            - Non-bleeding internal hemorrhoids.                        - Anal papilla(e) were hypertrophied.                        - Patent functional end-to-end colo-colonic  anastomosis, characterized by healthy appearing                         mucosa, an intact appearance and visible sutures.                         Biopsied.                        - Moderate diverticulosis in the sigmoid colon and in                         the  descending colon. There was no evidence of                         diverticular bleeding.                        - One 7 mm polyp in the proximal ascending colon,                         removed with a hot snare. Resected and retrieved.                        - The examination was otherwise normal.                        - NO evident recurrence of colorectal cancer based on                         luminal evaluation performed today. Recommendation:        - Patient has a contact number available for                         emergencies. The signs and symptoms of potential                         delayed complications were discussed with the patient.                         Return to normal activities tomorrow. Written                         discharge instructions were provided to the patient.                        - Resume previous diet.                        - Continue present medications.                        - Await pathology results.                        - Repeat colonoscopy is recommended for surveillance.                         The colonoscopy date will be determined after  pathology results from today's exam become available                         for review.                        - Return to GI office PRN.                        - Return to Dr. Earlie Server as previously scheduled.                        - The findings and recommendations were discussed with                         the patient. Procedure Code(s):     --- Professional ---                        [phone], Colonoscopy, flexible; with removal of                         tumor(s), polyp(s), or other lesion(s) by snare                         technique                        45380, 48, Colonoscopy, flexible; with biopsy, single                         or multiple Diagnosis Code(s):     --- Professional ---                        K57.30, Diverticulosis of large intestine without                          perforation or abscess without bleeding                        K63.5, Polyp of colon                        K64.1, Second degree hemorrhoids                        Z98.0, Intestinal bypass and anastomosis status                        K62.89, Other specified diseases of anus and rectum                        Z85.048, Personal history of other malignant neoplasm                         of rectum, rectosigmoid junction, and anus CPT copyright 2019 American Medical Association. All rights reserved. The codes documented in this report are preliminary and upon coder review may  be revised to meet current compliance requirements. Efrain Sella MD, MD 07/10/2020 1:37:47 PM This report has been signed electronically. Number of Addenda: 0 Note Initiated On: 07/10/2020 12:59 PM Scope Withdrawal Time:  0 hours 10 minutes 4 seconds  Total Procedure Duration: 0 hours 13 minutes 42 seconds  Estimated Blood Loss:  Estimated blood loss was minimal.      [facility]

## 2020-07-10 NOTE — Transfer of Care (Signed)
Immediate Anesthesia Transfer of Care Note  Patient: Garrett Peterson  Procedure(s) Performed: COLONOSCOPY WITH PROPOFOL (N/A )  Patient Location: PACU  Anesthesia Type:General  Level of Consciousness: sedated  Airway & Oxygen Therapy: Patient Spontanous Breathing and Patient connected to nasal cannula oxygen  Post-op Assessment: Report given to RN and Post -op Vital signs reviewed and stable  Post vital signs: Reviewed and stable  Last Vitals:  Vitals Value Taken Time  BP 114/62 07/10/20 1335  Temp    Pulse 80 07/10/20 1335  Resp 14 07/10/20 1335  SpO2 99 % 07/10/20 1335  Vitals shown include unvalidated device data.  Last Pain:  Vitals:   07/10/20 1248  TempSrc: Temporal      Patients Stated Pain Goal: 0 (58/94/83 4758)  Complications: No complications documented.

## 2020-07-10 NOTE — H&P (Signed)
  Outpatient short stay form Pre-procedure 07/10/2020 11:11 AM Garrett Peterson, M.D.  Primary Physician: Benita Stabile, M.D.  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 He unfortunately had a surveillance CT of the abd/pelvis suspicious for a local recurrence distal to the suture line in the rectum. He presents for diagnostic colonoscopy as a result.    No current facility-administered medications for this encounter.  Current Outpatient Medications:  .  atorvastatin (LIPITOR) 20 MG tablet, Take 20 mg by mouth daily., Disp: , Rfl:  .  albuterol (VENTOLIN HFA) 108 (90 Base) MCG/ACT inhaler, Inhale 2 puffs into the lungs every 6 (six) hours as needed for wheezing or shortness of breath. (Patient not taking: No sig reported), Disp: 1 each, Rfl: 0 .  capecitabine (XELODA) 500 MG tablet, Take 3 tablets (1,500 mg total) by mouth 2 (two) times daily after a meal. Take Monday through Friday during radiation., Disp: 126 tablet, Rfl: 0 .  dextromethorphan-guaiFENesin (MUCINEX DM) 30-600 MG 12hr tablet, Take 1 tablet by mouth 2 (two) times daily as needed for cough., Disp: 60 tablet, Rfl: 0 .  lidocaine-prilocaine (EMLA) cream, Apply to affected area once, Disp: 30 g, Rfl: 3 .  metoprolol tartrate (LOPRESSOR) 25 MG tablet, Take 25 mg by mouth 2 (two) times daily., Disp: , Rfl:  .  predniSONE (STERAPRED UNI-PAK 21 TAB) 10 MG (21) TBPK tablet, Take by mouth daily. Take 53m for 2 weeks, 277mfor 2 weeks then 102mor 2 weeks then 5mg3mily until further notice., Disp: 14 tablet, Rfl: 0 .  sertraline (ZOLOFT) 25 MG tablet, Take 1 tablet (25 mg total) by mouth daily. (Patient not taking: No sig reported), Disp: 30 tablet, Rfl: 0 .  sulfamethoxazole-trimethoprim (BACTRIM) 400-80 MG tablet,  Take 1 tablet by mouth every 12 (twelve) hours., Disp: 60 tablet, Rfl: 0 .  temazepam (RESTORIL) 30 MG capsule, Take 30 mg by mouth at bedtime. , Disp: , Rfl:   No medications prior to admission.     No Known Allergies   Past Medical History:  Diagnosis Date  . Actinic keratosis   . Adenomatous polyp of colon   . Arthritis   . Basal cell carcinoma   . CAD (coronary artery disease)   . Cancer (HCC)[place]21   rectal  . Hyperlipidemia   . Hypertension   . Pneumonia   . Pre-diabetes   . Respiratory failure with hypoxia (HCC)[place]  Review of systems:  Otherwise negative.    Physical Exam  Gen: Alert, oriented. Appears stated age.  HEENT: [place]/AT. PERRLA. Lungs: CTA, no wheezes. CV: RR nl S1, S2. Abd: soft, benign, no masses. BS+ Ext: No edema. Pulses 2+    Planned procedures: Proceed with colonoscopy. The patient understands the nature of the planned procedure, indications, risks, alternatives and potential complications including but not limited to bleeding, infection, perforation, damage to internal organs and possible oversedation/side effects from anesthesia. The patient agrees and gives consent to proceed.  Please refer to procedure notes for findings, recommendations and patient disposition/instructions.     Garrett K. ToleAlice ReichertD. Gastroenterology 07/10/2020  11:11 AM

## 2020-07-11 LAB — SURGICAL PATHOLOGY

## 2020-07-13 ENCOUNTER — Encounter: Payer: Self-pay | Admitting: Internal Medicine

## 2020-07-18 ENCOUNTER — Ambulatory Visit: Payer: Self-pay | Admitting: Internal Medicine

## 2020-07-20 ENCOUNTER — Telehealth: Payer: Self-pay | Admitting: Internal Medicine

## 2020-07-20 NOTE — Telephone Encounter (Signed)
Beth: you are seeing this compleex ILD patient on 3/23 I think. 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 Possible chemo related flare. He wil be on anti fibrotic. Please refer for Right heart cath with [facility] or [facility]   Raquel Sarna: Please also give him last 2 weks inn April  30 min followup with me - you can do this now  [place]

## 2020-07-20 NOTE — Progress Notes (Signed)
Interstitial Lung Disease Multidisciplinary Conference   Garrett Peterson    MRN [id number]    DOB 10/06/1945  Primary Care Physician:Tate, Leona Carry, MD  Referring Physician: Ann Peterson  Time of Conference: 7.30am- 8.30am Date of conference: 07/18/20 Location of Conference: -  Virtual  Participating Pulmonary: Dr. 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 2 admits. Most recent Jan 2022 - > 80% polys on BAL. TTbx end of 2021 at [facility] non-diagnostic.  Given dx of drug induced pneumonitis. However, CT April 2021 even before chemo had ILD (abasent in 2011). So, is this UIP with flare up from chemo? Clinically white male , > 65, negatie serology - so I am thinking IPF.  What is the pattern on CT? xxxxxxx clinical dx  - steroid role consdier anti fib  Serology: negative  MDD discussion of CT scan    - Date or time period of scan :2011 - fine 2021 April - > Had ILD pre chemo. ST+Periph GGAT +, some bronchiectassis +, peripheral bronchielectasis, CCG + -> PROB UIP 2021 Oct (in chemo) -> dramatic different CT, more cylindrical bronhictrasis, has dramatic bronchiectasis, GGO, very very dense consolidation, CCG + - WRSRS - PROB UIP with FLARE 2022 feb - similar except conslidative areas are more GGO- PROB UIP  clinically he is becoming more fibrotic per rad  Final RAd DX - PROB UIP with slare and worsening  Pathology discussion of biopsy no path but has BAL:  PFTs: No flowsheet data found.    Labs: *Results for Garrett Peterson (MRN [id number]) as of 07/20/2020 18:07  Ref. 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 Cnsider antifibrotics   Time Spent in preparation and discussion:  > 30 min    SIGNATURE   Dr. Brand Peterson, M.D., F.C.C.P,  Pulmonary and Critical Care Medicine Staff Physician, [place] Director - Interstitial Lung Disease  Program  P[facility] [place] [facility] [place], [place], [address]  Pager: [phone], If no answer or between  15:00h - 7:00h: call [phone] Telephone: [phone]  6:05 PM 07/20/2020 ...................................................................................................................[name] Kitchen References: Diagnosis of Hypersensitivity Pneumonitis in Adults. An Official ATS/JRS/ALAT Clinical Practice Guideline. Ragu G et al, [place] Aug 1;202(3):e36-e69.       Diagnosis of Idiopathic Pulmonary Fibrosis. An Official ATS/ERS/JRS/ALAT Clinical Practice Guideline. Raghu G et al, [place]. 2018 Sep 1;198(5):e44-e68.   IPF Suspected   Histopath ology Pattern      UIP  Probable UIP  Indeterminate for  UIP  Alternative  diagnosis  UIP  IPF  IPF  IPF  Non-IPF dx   HRCT   Probabe UIP  IPF  IPF  IPF (Likely)**  Non-IPF dx  Pattern  Indeterminate for UIP  IPF  IPF (Likely)**  Indeterminate  for IPF**  Non-IPF dx    Alternative diagnosis  IPF (Likely)**/ non-IPF dx  Non-IPF dx  Non-IPF dx  Non-IPF dx     Idiopathic pulmonary fibrosis diagnosis based upon HRCT and Biopsy paterns.  ** IPF is the likely diagnosis when any of following features are  present:  . Moderate-to-severe traction bronchiectasis/bronchiolectasis (defined as mild traction bronchiectasis/bronchiolectasis in four or more lobes including the lingual as a lobe, or moderate to severe traction bronchiectasis in two or more lobes) in a man over age 54 years or in a woman over age 53 years . Extensive (>30%) reticulation on HRCT and an age >70 years  . Increased neutrophils and/or absence of lymphocytosis in BAL fluid  . Multidisciplinary discussion reaches a confident diagnosis of IPF.   **Indeterminate for IPF  . Without an adequate biopsy is unlikely to be IPF  . With an adequate biopsy may be reclassified to a more specific diagnosis after multidisciplinary discussion and/or additional consultation.   dx = diagnosis; HRCT = high-resolution computed tomography; IPF = idiopathic pulmonary fibrosis; UIP = usual interstitial pneumonia.

## 2020-07-21 NOTE — Telephone Encounter (Signed)
Noted, thanks

## 2020-07-24 NOTE — Telephone Encounter (Signed)
[employer] still waiting for patient's income. Letter was mailed to patient on 2/25.  Called patient, no answer mailbox is full

## 2020-07-25 NOTE — Telephone Encounter (Signed)
Attempted to call pt but unable to reach. Unable to leave VM due to mailbox being full. Will try to call back later.

## 2020-07-26 NOTE — Telephone Encounter (Signed)
Pt has been scheduled an appt with MR by Melodye Ped when pt called office back yesterday 3/15 but we have not been able to discuss info with pt per MR that he also has cc'd Beth with due to pt's upcoming appt being with her.  Attempted to call pt to discuss info with him but unable to reach. Left message for him to return call.

## 2020-07-27 NOTE — Telephone Encounter (Signed)
Called patient, left message to follow up on income documents for [facility] application

## 2020-07-27 NOTE — Telephone Encounter (Signed)
Spoke with patient about upcoming appointment with [facility] as well as appointment in April. Patient confirmed both as well as PFT. Nothing further needed at this time.

## 2020-08-01 NOTE — Telephone Encounter (Signed)
Spoke to patient, he states he sent in his income documentation to [place] 2 weeks ago. Called [employer], they do not see where they were received.  Phone#[phone]  Patient will gather documents and bring them to the office tomorrow 08/02/20.

## 2020-08-01 NOTE — Progress Notes (Signed)
_0  ID: Garrett Peterson, male    DOB: 04/17/1946, 75 y.o.   MRN: [id number]  Chief Complaint  Patient presents with  . Shortness of Breath    Reports less dyspnea since last office visit    Referring provider: Albina Billet, MD  HPI: 75 year old male, never smoked.  Significant for chronic hypoxic respiratory failure, coronary artery disease, hypertension.  Patient of Dr. Chase Caller  Previous [facility] pulmonary encounter: 07/04/20- Dr. Chase Caller  Referred to the ILD center for comprehensive evaluation by Dr. Rodman Pickle primary pulmonologist  Garrett Peterson 75 y.o. -very complicated story.  Details of the story is copied and pasted above.  Meeting him for the first time with his wife.  It appears that he was previously healthy as of April 2021.  Although CT scan of the chest at the time of my personal visualization showed presence of early ILD [CT abdomen 10 years prior in 2011 had no ILD].  He was diagnosed with anorectal cancer.  He was undergoing treatment at [facility].  Chemotherapy as above.  Then in the fall/winter 2020 when he started falling ill with respiratory illnesses.  This is all documented above.  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  CT scan of the chest by October 2021 started showing significant amount of groundglass opacities.  He only had ANA test, rheumatoid factor and angiotensin-converting enzyme.  This is all negative.  For serology.  Otherwise has not had any other serology.  He had hypersensitive pneumonitis panel this was negative.  This was at outside facility [facility] clinic pulmonology program.  He has had 2 hospitalizations the most recent one being in January.  It appears that he was treated with prednisone each time.  It is unclear to me as this to him and his wife if he has been on chronic prednisone but reading the notes it appears so.  Currently is just finished a taper starting at 60 mg/day in January and finished at 20 mg and stop.  He says since stopping  yesterday or today he is beginning to feel a little bit worse.  Room air oxygen at rest was fine but when he desaturated after he walked more than 2 laps.  However he needed 5 L to correct.  Most recent echocardiogram was normal.  Most recent lavage showed mostly neutrophils.  Again culture negative.  His most recent CT scan of the chest in February 2022 without contrast shows improvement in groundglass opacities but no fibrosis.  His serologies ANA negative and rheumatoid factor negative and angiotensin converting enzyme negative  Definitely earlier on in the year it was not consistent with UIP and suggested alternate diagnosis.  Unclear to me if he has probable UIP pattern at this point in time in February 2022.  We did not have time to do an extensive detailed ILD questionnaire.  It appears that his goals are to get better and also feel better at the same time control his cancer.  They are also wondering about prognosis.  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  He is currently looking into getting radiation locally.  Oncologist wants to do a colonoscopy.  She feels the recurrence is because all his chemo has been on hold because of respiratory issues.  SYMPTOM SCALE - ILD 07/04/2020   O2 use 4L   Shortness  of Breath 0 -> 5 scale with 5 being worst (score 6 If unable to do)  At rest 1  Simple tasks - showers, clothes change, eating, shaving 4  Household (dishes, doing bed, laundry) x  Shopping x  Walking level at own pace 3  Walking up Stairs 4  Total (30-36) Dyspnea Score 12  How bad is your cough? 0  How bad is your fatigue 4  How bad is nausea 0  How bad is vomiting?  0  How bad is diarrhea? 0  How bad is anxiety? 5  How bad is depression 5    SErology Dec 2021 - [name]  ANA Direct - [facility] Negative Negative    RA Latex Turbid. - [facility] 0.0 - 13.9 IU/mL <10.0   A.Fumigatus #1 Abs - [facility] Negative Negative  Micropolyspora faeni, IgG - [facility] Negative Negative  Thermoactinomyces vulgaris, IgG - [facility] Negative Negative  A. Pullulans Abs - [facility] Negative Negative  Thermoact. Saccharii - [facility] Negative Negative  [place] Serum Abs - [facility] Negative Negative   Angio Convert Enzyme - [facility] 14 - 82 U/L 26   Walk tst 07/04/2020    94% - ra at rest -> walked 2 laps without desat and dropped to 86% in middle of 3rd lap. Then need 5L [place] to correct to walk 3 laps in office   CT chest 06/23/20   IMPRESSION: 1. 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 Fibrosis has progressed since 03/02/2020 chest CT with resolved consolidative opacities. 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 UIP is difficult to exclude but is less favored. Follow-up high-resolution chest CT suggested in 6-12 months. Findings are indeterminate for UIP per consensus guidelines: Diagnosis of Idiopathic Pulmonary Fibrosis: An Official ATS/ERS/JRS/ALAT Clinical Practice Guideline. [place], Iss 5, [phone], Jan 11 2017. 2. Stable mild cardiomegaly. 3. Aortic Atherosclerosis (ICD10-I70.0).   Electronically Signed   By: [name] M.D.   On: 06/24/2020 15:15     08/02/2020 Patient presents today for 1 month follow-up with spirometry/DLCO. Patient was discussed at ILD conference in March 2022, diagnosed with IPF. Recent exacerbation likely chemo related. Started on Ofev. Patient was referred for right heart cath and referred to pulmonary rehab.  He is currently on an extended prednisone taper.  He started 34m prednisone dose today x 2 weeks. He is still taking Bactrim. Breathing has improved. He has not started antifibrotic medication yet, his wifes states that their insurance has improved OFEV medication.   PFT 08/02/2020 - FVC 1.61 (41%), FEV1 1.55 (55%), ratio 96, DLCOcor 12.27 (52%)   SYMPTOM SCALE - ILD 07/04/2020  08/02/2020   O2 use 4L  4L  Shortness of Breath 0 -> 5 scale with 5 being worst (score 6 If unable to do)   At rest 1 0  Simple tasks - showers, clothes change, eating, shaving 4 3  Household (dishes, doing bed, laundry) x x  Shopping x x  Walking level at own pace 3 3  Walking up Stairs 4 4  Total (30-36) Dyspnea Score 12 10  How bad is your cough? 0 0  How bad is your fatigue 4 3  How bad is nausea 0 0  How bad is vomiting?  0 0  How bad is diarrhea?  0 0  How bad is anxiety? 5 3  How bad is depression 5 3      No Known Allergies  Immunization History  Administered Date(s) Administered  . PFIZER(Purple Top)SARS-COV-2 Vaccination 01/18/2020  . Zoster Recombinat (Shingrix) 01/12/2018, 03/21/2018    Past Medical History:  Diagnosis Date  . Actinic keratosis   . Adenomatous polyp of colon   . Arthritis   . Basal cell carcinoma   . CAD (coronary artery disease)   . Cancer ([place]) 2021   rectal  . Hyperlipidemia   . Hypertension   . Pneumonia   . Pre-diabetes   . Respiratory failure with hypoxia (HCC)     Tobacco History: Social History   Tobacco Use  Smoking Status Never Smoker  Smokeless Tobacco Former Systems developer  . Types: Chew   Counseling given: Not Answered   Outpatient Medications Prior to Visit  Medication Sig Dispense Refill  . atorvastatin (LIPITOR) 20 MG tablet Take 20 mg by mouth daily.    . metoprolol tartrate (LOPRESSOR) 25 MG tablet Take 25 mg by mouth 2 (two) times daily.    . predniSONE (STERAPRED UNI-PAK 21 TAB) 10 MG (21) TBPK tablet Take by mouth daily. Take 63m for 2 weeks, 28mfor 2 weeks then 1037mor 2 weeks then  5mg30mily until further notice. 14 tablet 0  . sulfamethoxazole-trimethoprim (BACTRIM) 400-80 MG tablet Take 1 tablet by mouth every 12 (twelve) hours. 60 tablet 0  . temazepam (RESTORIL) 30 MG capsule Take 30 mg by mouth at bedtime.     . alMarland Kitchenuterol (VENTOLIN HFA) 108 (90 Base) MCG/ACT inhaler Inhale 2 puffs into the lungs every 6 (six) hours as needed for wheezing or shortness of breath. (Patient not taking: Reported on 08/02/2020) 1 each 0  . capecitabine (XELODA) 500 MG tablet Take 3 tablets (1,500 mg total) by mouth 2 (two) times daily after a meal. Take Monday through Friday during radiation. (Patient not taking: Reported on 08/02/2020) 126 tablet 0  . dextromethorphan-guaiFENesin (MUCINEX DM) 30-600 MG 12hr tablet Take 1 tablet by mouth 2 (two) times daily as needed for cough. (Patient not taking: Reported on 08/02/2020) 60 tablet 0  . lidocaine-prilocaine (EMLA) cream Apply to affected area once (Patient not taking: Reported on 08/02/2020) 30 g 3  . sertraline (ZOLOFT) 25 MG tablet Take 1 tablet (25 mg total) by mouth daily. (Patient not taking: Reported on 08/02/2020) 30 tablet 0   No facility-administered medications prior to visit.   Review of Systems  Review of Systems  Constitutional: Positive for fatigue.  Respiratory: Positive for shortness of breath. Negative for cough and wheezing.    Physical Exam  BP 122/76   Pulse 90   Temp 97.8 F (36.6 C)   Ht _0  (1.753 m)   Wt 214 lb (97.1 kg)   SpO2 95%   BMI 31.60 kg/m  Physical Exam Constitutional:      Appearance: Normal appearance.  Cardiovascular:     Rate and Rhythm: Normal rate and regular rhythm.  Pulmonary:     Effort: Pulmonary effort is normal.     Breath sounds: Rales present. No wheezing.     Comments: O2 4L with exertion Neurological:     General: No focal deficit present.     Mental Status: He is alert and oriented to person, place, and time. Mental status is at baseline.  Psychiatric:        Mood and  Affect: Mood normal.  Behavior: Behavior normal.        Thought Content: Thought content normal.        Judgment: Judgment normal.      Lab Results:  CBC    Component Value Date/Time   WBC 12.3 (H) 06/14/2020 1146   RBC 4.39 06/14/2020 1146   HGB 15.0 06/14/2020 1146   HGB 14.4 09/20/2011 1104   HCT 42.7 06/14/2020 1146   HCT 41.1 09/20/2011 1104   PLT 158 06/14/2020 1146   PLT 135 (L) 09/20/2011 1104   MCV 97.3 06/14/2020 1146   MCV 99 09/20/2011 1104   MCH 34.2 (H) 06/14/2020 1146   MCHC 35.1 06/14/2020 1146   RDW 12.9 06/14/2020 1146   RDW 12.9 09/20/2011 1104   LYMPHSABS 1.0 06/14/2020 1146   LYMPHSABS 1.9 09/20/2011 1104   MONOABS 0.5 06/14/2020 1146   MONOABS 0.4 09/20/2011 1104   EOSABS 0.1 06/14/2020 1146   EOSABS 0.2 09/20/2011 1104   BASOSABS 0.0 06/14/2020 1146   BASOSABS 0.1 09/20/2011 1104    BMET    Component Value Date/Time   NA 137 06/14/2020 1146   NA 142 09/20/2011 1104   K 5.2 (H) 06/14/2020 1146   K 3.8 09/20/2011 1104   CL 101 06/14/2020 1146   CL 107 09/20/2011 1104   CO2 26 06/14/2020 1146   CO2 28 09/20/2011 1104   GLUCOSE 279 (H) 06/14/2020 1146   GLUCOSE 119 (H) 09/20/2011 1104   BUN 18 06/14/2020 1146   BUN 12 09/20/2011 1104   CREATININE 1.20 06/14/2020 1146   CREATININE 0.99 09/20/2011 1104   CALCIUM 9.4 06/14/2020 1146   CALCIUM 8.8 09/20/2011 1104   GFRNONAA >60 06/14/2020 1146   GFRNONAA >60 09/20/2011 1104   GFRAA >60 02/02/2020 0817   GFRAA >60 09/20/2011 1104    BNP    Component Value Date/Time   BNP 84.1 03/03/2020 0930    ProBNP No results found for: PROBNP  Imaging: No results found.   Assessment & Plan:   IPF (idiopathic pulmonary fibrosis) ([place]) - Patient was discussed at [place] in March 2022, diagnosed with IPF. Recent exacerbation likely chemo related. Pulmonary testing today showed moderate-severe restriction with diffusion defect/ FEV1 1.55 (55%), DLCOcor 52%. He is on an extended  prednisone taper, current dose 60m x2 weeks. He reports improvement in breathing. Patient to stop Bactrim once he gets to 569mprednisone. Started on OFEV, awaiting insurance approval and medication delivery. Re-referring to cardiology for right heart cath and pulmonary rehab. FU in April with Dr. RaChase Caller  Chronic hypoxemic respiratory failure (HCC) - Continues to benefit from supplemental oxygen - O2 95% 4L today    ElMartyn EhrichNP 08/08/2020

## 2020-08-02 ENCOUNTER — Ambulatory Visit (INDEPENDENT_AMBULATORY_CARE_PROVIDER_SITE_OTHER): Payer: Medicare Other | Admitting: Internal Medicine

## 2020-08-02 ENCOUNTER — Ambulatory Visit (INDEPENDENT_AMBULATORY_CARE_PROVIDER_SITE_OTHER): Payer: Medicare Other | Admitting: Primary Care

## 2020-08-02 ENCOUNTER — Other Ambulatory Visit: Payer: Self-pay

## 2020-08-02 ENCOUNTER — Telehealth: Payer: Self-pay | Admitting: Primary Care

## 2020-08-02 VITALS — BP 122/76 | HR 90 | Temp 97.8°F | Ht 69.0 in | Wt 214.0 lb

## 2020-08-02 DIAGNOSIS — J849 Interstitial pulmonary disease, unspecified: Secondary | ICD-10-CM | POA: Diagnosis not present

## 2020-08-02 DIAGNOSIS — J9611 Chronic respiratory failure with hypoxia: Secondary | ICD-10-CM | POA: Diagnosis not present

## 2020-08-02 DIAGNOSIS — J84112 Idiopathic pulmonary fibrosis: Secondary | ICD-10-CM

## 2020-08-02 LAB — PULMONARY FUNCTION TEST
DL/VA % pred: 103 %
DL/VA: 4.1 ml/min/mmHg/L
DLCO cor % pred: 52 %
DLCO cor: 12.27 ml/min/mmHg
DLCO unc % pred: 52 %
DLCO unc: 12.41 ml/min/mmHg
FEF 25-75 Pre: 3.08 L/sec
FEF2575-%Pred-Pre: 154 %
FEV1-%Pred-Pre: 55 %
FEV1-Pre: 1.55 L
FEV1FVC-%Pred-Pre: 132 %
FEV6-%Pred-Pre: 44 %
FEV6-Pre: 1.61 L
FEV6FVC-%Pred-Pre: 107 %
FVC-%Pred-Pre: 41 %
FVC-Pre: 1.61 L
Pre FEV1/FVC ratio: 96 %
Pre FEV6/FVC Ratio: 100 %

## 2020-08-02 NOTE — Patient Instructions (Signed)
Spirometry/DLCO performed today.

## 2020-08-02 NOTE — Telephone Encounter (Signed)
Patient dropped off income documents. Faxed to [employer]. Awaiting response.

## 2020-08-02 NOTE — Patient Instructions (Signed)
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 This was your baseline test and we will be monitoring lung function every 3-6 months   I think we will be able to stop Bactrim, let me ask Dr. Chase Caller and I will contact you once ive heard back  Continue to taper prednisone as directed   We will follow-up on [name] pulmonary rehab / you can call [phone] to see if you can get scheduled with them   We will refer to cardiologist for possible right heart cath   Keep follow-up with Dr. Chase Caller in April    Chronic Respiratory Failure  Respiratory failure is a condition in which the lungs do not work well and the breathing (respiratory) system fails. When respiratory failure occurs, it becomes difficult for the lungs to get enough oxygen or to eliminate carbon dioxide (or both). If the lungs do not work properly, the heart, brain, and other body systems do not get enough oxygen. Respiratory failure is life-threatening if not treated. Respiratory failure can be acute or chronic. Acute respiratory failure is sudden and severe and requires emergency medical treatment. Chronic respiratory failure happens over time--months to years--and is usually due to a medical condition that gets worse. What are the causes? This condition may be caused by any problem that affects the heart or lungs. Causes include:  Lung or airway disease, such as: ? Chronic bronchitis and emphysema (COPD). ? Asthma. ? Cystic fibrosis. ? Pulmonary hypertension. ? Pulmonary fibrosis. This is scarring of the lung tissue.  Infection, such as pneumonia.  Nerve or muscle injury or diseases that make chest movements difficult, such as: ? Lou Gehrig's disease. ? Guillain-Barre syndrome. ? Stroke. ? Spinal cord injuries.  Fluid in the lungs (pulmonary edema). This may be due to heart failure  or lung injury.  A blood clot in a lung (pulmonary embolism).  Trauma to the chest that makes breathing difficult. This may include a collapsed lung (pneumothorax). What increases the risk? You are more likely to develop this condition if:  You smoke or vape, have a history of smoking or vaping, or have exposure to secondhand smoke.  You have a weak immune system.  You have a family history of breathing problems or lung disease.  You have sleep apnea.  You have congestive heart failure.  You are obese.  You have been exposed to hazardous substances at work, such as chemicals, asbestos, industrial dyes, or chemical fumes. What are the signs or symptoms? Symptoms of this condition include:  Shortness of breath or difficulty breathing.  A cough with or without mucus (sputum).  Wheezing.  Chest pain or tightness.  A bluish color to the fingernail or toenail beds.  Confusion.  Drowsiness or feeling tired easily, especially with minimal activity. How is this diagnosed? This condition may be diagnosed based on:  Your medical history.  A physical exam.  Other tests, such as: ? Imaging tests. These may include a chest X-ray or CT scan. ? Blood tests, such as an arterial blood gas test. This test is done to check if you have enough oxygen in your blood or if your carbon dioxide levels are too high. ? An electrocardiogram. This test records the electrical activity of your heart. ? An echocardiogram. This test uses sound waves to make an image of your heart. ? Pulmonary function tests. These help to determine if you have chronic  lung disease. ? Bronchoscopy. During this test, your health care provider uses a tube with a camera to look inside your airways for signs of inflammation or other problems. How is this treated? Treatment for this condition depends on the cause. Treatment may include:  Breathing in oxygen through a tube with prongs that sit in your nose (nasal  cannula) or through a mask that fits over your face.  Receiving noninvasive positive pressure ventilation, called CPAP or BPAP. This is a method of breathing support in which a machine blows air into your lungs through a mask.  Medicines to help with breathing, such as: ? Medicines that open up and relax air passages, such as bronchodilators. These may be given through a device that turns liquid medicines into a mist you can breathe in (nebulizer). These medicines help with breathing. ? Diuretics. These medicines get rid of extra fluid in your lungs, which can help you breathe better. ? Steroid medicines. These decrease inflammation in the lungs. ? Antibiotic medicines. These may be given to treat a bacterial infection, such as pneumonia. ? Blood thinners (anticoagulants) to treat blood clots in the lungs.  Pulmonary rehabilitation. This is an exercise program that strengthens the muscles in your chest and helps you learn breathing techniques to manage your condition.  Using a ventilator. This is a breathing machine that delivers oxygen to the lungs through a breathing tube that is put into the trachea. This machine is used when you can no longer breathe well enough on your own. Follow these instructions at home: Medicines  Take over-the-counter and prescription medicines only as told by your health care provider.  If you were prescribed an antibiotic medicine, take it as told by your health care provider. Do not stop taking the antibiotic even if you start to feel better. Lifestyle  Do not use any products that contain nicotine or tobacco, such as cigarettes, e-cigarettes, and chewing tobacco. If you need help quitting, ask your health care provider. Avoid secondhand smoke.  Avoid exposure to irritants that make your breathing problems worse. These include smoke, chemicals, and fumes. General instructions  Use oxygen therapy and do pulmonary rehabilitation if directed by your health care  provider. If you require home oxygen therapy, ask your health care provider whether you should purchase a pulse oximeter to measure your oxygen level at home.  If you were given a CPAP machine, make sure that you understand how to use, clean, and care for the machine and that you use it as directed.  Stay active, but balance activity with periods of rest. Exercise and physical activity will help you maintain your ability to do things you want to do.  Stay up to date on all vaccines, especially pneumonia and yearly flu (influenza) vaccines.  Avoid people who are sick and avoid crowded places during the flu season.  Work with your health care provider to create a plan to help you deal with your condition. Follow this plan.  Keep all follow-up visits as told by your health care provider. This is important. Contact a health care provider if:  Your shortness of breath gets worse and you cannot do the things you used to do.  You have increased sputum, wheezing, coughing, or loss of energy.  You are on oxygen therapy and you are starting to need more.  You need to use your medicines more often.  You have a fever of 100.21F (38C) or higher. Get help right away if:  Your shortness of breath  becomes suddenly or significantly worse.  You develop chest pain, tightness, or pressure.  You are unable to say more than a few words without having to catch your breath.  Your lips, toenails, or fingernails are a bluish color.  You become confused or difficult to wake up. These symptoms may represent a serious problem that is an emergency. Do not wait to see if the symptoms will go away. Get medical help right away. Call your local emergency services (911 in the [place] Do not drive yourself to the hospital. Summary  Respiratory failure is a condition in which the lungs do not work well and the breathing system fails.  This condition can be very serious and is often life-threatening. Chronic  respiratory failure has many causes, including COPD, lung infections, and fluid in the lungs. Chronic respiratory failure is lifelong, but its acute symptoms can be very dangerous.  This condition is diagnosed with specific tests and can be treated with medicines, oxygen, or both.  Contact a health care provider if your shortness of breath gets worse, you develop chest pain or fever, or you need to use your oxygen or medicines more often than before. This information is not intended to replace advice given to you by your health care provider. Make sure you discuss any questions you have with your health care provider. Document Revised: 06/04/2019 Document Reviewed: 06/04/2019 Elsevier Patient Education  2021 [place] Oxygen Use, Adult When a medical condition keeps you from getting enough oxygen, your health care provider may instruct you to take extra oxygen at home. Your health care provider will let you know:  When to take oxygen.  How long to take oxygen.  How quickly oxygen should be delivered (flow rate), in liters per minute (LPM or L/M). Home oxygen can be given through:  A mask.  A nasal cannula. This is a device or tube that goes in the nostrils.  A transtracheal catheter. This is a small, thin tube placed in the windpipe (trachea).  A breathing tube (tracheostomy tube) that is surgically placed in the windpipe. This may be used in severe cases. These devices are connected with tubing to an oxygen source, such as:  A tank. Tanks hold oxygen in gas form. They must be replaced when the oxygen is used up.  A liquid oxygen device. This holds oxygen in liquid form. Liquid oxygen is very cold. It must be replaced when the oxygen is used up.  An oxygen concentrator machine. This filters oxygen in the room. There are two types of oxygen concentrator machines--stationary and portable. ? A stationary oxygen concentrator machine plugs into the main electricity supply at  your home. You must have a backup cylinder of oxygen in case the power goes out. ? A portable oxygen concentrator machine is smaller in size and more lightweight. This machine uses battery supply and can be used outside the home. Work with your health care provider to find equipment that works best for you and your lifestyle. What are the risks? Delivery of supplemental oxygen is generally safe. However, some risks include:  Fire. This can happen if the oxygen is exposed to a heat source, flame, or spark.  Injury to skin. This can happen if liquid oxygen touches your skin.  Damage to the lungs or other organs. This can happen from getting too little or too much oxygen. Supplies needed: To use oxygen, you will need:  A mask, nasal cannula, transtracheal catheter, or tracheostomy.  An oxygen tank,  a liquid oxygen device, or an oxygen concentrator.  The tape that your health care provider recommends (optional). Your health care provider may also recommend:  A humidifier to warm and moisten the oxygen delivered. This will depend on how much oxygen you need and the type of home oxygen device you use.  A pulse oximeter. This device measures the percentage of oxygen in your blood. How to use oxygen Your health care provider or a person from your [place] will show you how to use your oxygen device. Follow his or her instructions. The instructions may look something like this: 1. Wash your hands with soap and water. 2. If you use an oxygen concentrator, make sure it is plugged in. 3. Place one end of the tube into the port on the tank, device, or machine. 4. Place the mask over your nose and mouth. Or, place the nasal cannula and secure it with tape if instructed. If you use a tracheostomy or transtracheal catheter, connect it to the oxygen source as directed. 5. Make sure the liter-flow setting on the machine is at the level prescribed by your health care provider. 6. Turn on  the machine or adjust the knob on the tank or device to the correct liter-flow setting. 7. When you are done, turn off and unplug the machine, or turn the knob to OFF.   How to clean and care for the oxygen supplies Nasal cannula  Clean it with a warm, wet cloth daily or as needed.  Wash it with a liquid soap once a week.  Rinse it thoroughly once or twice a week.  Air-dry it.  Replace it every 2-4 weeks.  If you have an infection, such as a cold or pneumonia, change the cannula when you get better. Mask  Replace it every 2-4 weeks.  If you have an infection, such as a cold or pneumonia, change the mask when you get better. Humidifier bottle  Wash the bottle between each refill: ? Wash it with soap and warm water. ? Rinse it thoroughly. ? Clean it and its top with a disinfectant cleaner. ? Air-dry it. ? Make sure it is dry before you refill it. Oxygen concentrator  Clean the air filter at least twice a week according to directions from your home medical equipment and service company.  Wipe down the cabinet every day. To do this: ? Unplug the unit. ? Wipe down the cabinet with a damp cloth. ? Dry the cabinet. Other equipment  Change any extra tubing every 1-3 months.  Follow instructions from your health care provider about taking care of any other equipment. Safety tips Fire safety tips  Keep your oxygen and oxygen supplies at least 6 ft (2 m) away from sources of heat, flames, and sparks at all times.  Do not allow smoking near your oxygen. Put up "no smoking" signs in your home. Avoid smoking areas when in public.  Do not use materials that can burn (are flammable) while you use oxygen. This includes: ? Petroleum jelly. ? Hair spray or other aerosol sprays. ? Rubbing alcohol. ? Hand sanitizer.  When you go to a restaurant with portable oxygen, ask to be seated in the non-smoking section.  Keep a Data processing manager close by. Let your fire department know that  you have oxygen in your home.  Test your home smoke detectors regularly.   Traveling  Secure your oxygen tank in the vehicle so that it does not move around. Follow instructions from  your medical device company about how to safely secure your tank.  Make sure you have enough oxygen for the amount of time you will be away from home.  If you are planning to travel by public transportation (airplane, train, bus, or boat), contact the company to find out if it allows the use of an approved portable oxygen concentrator. You may also need documents from your health care provider and medical device company before you travel. General safety tips  If you use an oxygen cylinder, make sure it is in a stand or secured to an object that will not move (fixed object).  If you use liquid oxygen, make sure its container is kept upright at all times.  If you use an oxygen concentrator: ? Dance movement psychotherapist company. Make sure you are given priority service in the event that your power goes out. ? Avoid using extension cords if possible. Follow these instructions at home:  Use oxygen only as told by your health care provider.  Do not use alcohol or other drugs that make you relax (sedating drugs) unless instructed. They can slow down your breathing rate and make it hard to get in enough oxygen.  Know how and when to order a refill of oxygen.  Always keep a spare tank of oxygen. Plan ahead for holidays when you may not be able to get a prescription filled.  Use water-based lubricants on your lips or nostrils. Do not use oil-based products like petroleum jelly.  To prevent skin irritation on your cheeks or behind your ears, tuck some gauze under the tubing. Where to find more information  American Lung Association: DiabeticMale.de Contact a health care provider if:  You get headaches often.  You have a lasting cough.  You are restless or have anxiety.  You develop an illness that affects  your breathing.  You cannot exercise at your regular level.  You have a fever.  You have persistent redness under your nose. Get help right away if:  You are confused.  You are sleepy all the time.  You have blue lips or fingernails.  You have difficult or irregular breathing that is getting worse.  You are struggling to breathe. These symptoms may represent a serious problem that is an emergency. Do not wait to see if the symptoms will go away. Get medical help right away. Call your local emergency services (911 in the [place] Do not drive yourself to the hospital. Summary  Your health care provider or a person from your [place] will show you how to use your oxygen device. Follow his or her instructions.  If you use an oxygen concentrator, make sure it is plugged in.  Make sure the liter-flow setting on the machine is at the level prescribed by your health care provider.  Use oxygen only as told by your health care provider.  Keep your oxygen and oxygen supplies at least 6 ft (2 m) away from sources of heat, flames, and sparks at all times. This information is not intended to replace advice given to you by your health care provider. Make sure you discuss any questions you have with your health care provider. Document Revised: 07/01/2019 Document Reviewed: 04/27/2019 Elsevier Patient Education  2021 [place].    Pulmonary Fibrosis  Pulmonary fibrosis is a type of lung disease that causes scarring. Over time, the scar tissue builds up in the air sacs of your lungs (alveoli). This makes it hard for you to breathe. Less oxygen  can get into your blood. Scarring from pulmonary fibrosis gets worse over time. This damage is permanent and may lead to other serious health problems. What are the causes? There are many different causes of pulmonary fibrosis. Sometimes the cause is not known. This is called idiopathic pulmonary fibrosis. Other causes  include:  Exposure to chemicals and substances found in agricultural, farm, Architect, or factory work. These include mold, asbestos, silica, metal dusts, and toxic fumes.  Sarcoidosis. In this disease, areas of inflammatory cells (granulomas) form and most often affect the lungs.  Autoimmune diseases. These include diseases such as rheumatoid arthritis, systemic sclerosis, or connective tissue disease.  Taking certain medicines. These include drugs used in radiation therapy or used to treat seizures, heart problems, and some infections. What increases the risk? You are more likely to develop this condition if:  You have a family history of the disease.  You are older. The condition is more common in older adults.  You have a history of smoking.  You have a job that exposes you to certain chemicals.  You have gastroesophageal reflux disease (GERD). What are the signs or symptoms? Symptoms of this condition include:  Difficulty breathing that gets worse with activity.  Shortness of breath (dyspnea).  Dry, hacking cough.  Rapid, shallow breathing during exercise or while at rest.  Bluish skin and lips.  Loss of appetite.  Weakness.  Weight loss and fatigue.  Rounded and enlarged fingertips (clubbing). How is this diagnosed? This condition may be diagnosed based on:  Your symptoms and medical history.  A physical exam. You may also have tests, including:  A test that involves looking inside your lungs with an instrument (bronchoscopy).  Imaging studies of your lungs and heart.  Tests to measure how well you are breathing (pulmonary function tests).  Blood tests.  Tests to see how well your lungs work while you are walking (pulmonary stress test).  A procedure to remove a lung tissue sample to look at it under a microscope (biopsy). How is this treated? There is no cure for pulmonary fibrosis. Treatment focuses on managing symptoms and preventing scarring  from getting worse. This may include:  Medicines, such as: ? Steroids to prevent permanent lung changes. ? Medicines to suppress your body's defense system (immune system). ? Medicines to help with lung function by reducing inflammation or scarring.  Ongoing monitoring with X-rays and lab work.  Oxygen therapy.  Pulmonary rehabilitation.  Surgery. In some cases, a lung transplant is possible. Follow these instructions at home: Medicines  Take over-the-counter and prescription medicines only as told by your health care provider.  Keep your vaccinations up to date as recommended by your health care provider. General instructions  Do not use any products that contain nicotine or tobacco, such as cigarettes and e-cigarettes. If you need help quitting, ask your health care provider.  Get regular exercise, but do not overexert yourself. Ask your health care provider to suggest some activities that are safe for you to do. ? If you have physical limitations, you may get exercise by walking, using a stationary bike, or doing chair exercises. ? Ask your health care provider about using oxygen while exercising.  If you are exposed to chemicals and substances at work, make sure that you wear a mask or respirator at all times.  Join a pulmonary rehabilitation program or a support group for people with pulmonary fibrosis.  Eat small meals often so you do not get too full. Overeating can make  breathing trouble worse.  Maintain a healthy weight. Lose weight if you need to.  Do breathing exercises as directed by your health care provider.  Keep all follow-up visits as told by your health care provider. This is important.      Contact a health care provider if you:  Have symptoms that do not get better with medicines.  Are not able to be as active as usual.  Have trouble taking a deep breath.  Have a fever or chills.  Have blue lips or skin.  Have clubbing of your fingers. Get  help right away if you:  Have a sudden worsening of your symptoms.  Have chest pain.  Cough up mucus that is dark in color.  Have a lot of headaches.  Get very confused or sleepy. Summary  Pulmonary fibrosis is a type of lung disease that causes scar tissue to build up in the air sacs of your lungs (alveoli) over time. Less oxygen can get into your blood. This makes it hard for you to breathe.  Scarring from pulmonary fibrosis gets worse over time. This damage is permanent and may lead to other serious health problems.  You are more likely to develop this condition if you have a family history of the condition or a job that exposes you to certain chemicals.  There is no cure for pulmonary fibrosis. Treatment focuses on managing symptoms and preventing scarring from getting worse. This information is not intended to replace advice given to you by your health care provider. Make sure you discuss any questions you have with your health care provider. Document Revised: 06/04/2017 Document Reviewed: 06/04/2017 Elsevier Patient Education  2021 [employer].

## 2020-08-02 NOTE — Telephone Encounter (Signed)
Raquel Sarna can you please call patient and let him know that he can stop bactrim when he gets to 35m prednisone. I also just send lab results to you.

## 2020-08-02 NOTE — Telephone Encounter (Signed)
He can stop bactrim when he gets down to 1m per day. Thanks for seeing him

## 2020-08-02 NOTE — Progress Notes (Signed)
Spirometry/DLCO performed today.

## 2020-08-02 NOTE — Telephone Encounter (Signed)
Patient is on a long steriod taper, he started 47m prednisone yesterday. Do you want to discontinue Bactrim? He is doing some better. Still on 4L oxygen with exertion. O2 was 95-97% on RA at rest. I re-referred him to pulm rehab and cardiology for right heart cath. He has not started OFEV, pharmacy is working on this and got papers today. Wife told me her insurance has approved.

## 2020-08-03 NOTE — Telephone Encounter (Signed)
Pt returning call from office . Please advise  

## 2020-08-03 NOTE — Telephone Encounter (Signed)
Attempted to call pt but unable to reach. Left message for him to return call.

## 2020-08-03 NOTE — Telephone Encounter (Signed)
Spoke with the pt and notified to d/c bactrim once he gets to 5 mg pred  He verbalized understanding  Nothing further needed

## 2020-08-04 ENCOUNTER — Encounter: Payer: Medicare Other | Attending: Internal Medicine | Admitting: *Deleted

## 2020-08-04 ENCOUNTER — Other Ambulatory Visit: Payer: Self-pay

## 2020-08-04 DIAGNOSIS — J9611 Chronic respiratory failure with hypoxia: Secondary | ICD-10-CM | POA: Insufficient documentation

## 2020-08-04 DIAGNOSIS — J849 Interstitial pulmonary disease, unspecified: Secondary | ICD-10-CM | POA: Insufficient documentation

## 2020-08-04 NOTE — Progress Notes (Signed)
Initial telephone orientation completed. Diagnosis can be found in [facility] 3/23. EP orientation scheduled for Thursday 3/31 at 8am.

## 2020-08-08 ENCOUNTER — Telehealth: Payer: Self-pay | Admitting: Internal Medicine

## 2020-08-08 DIAGNOSIS — J84112 Idiopathic pulmonary fibrosis: Secondary | ICD-10-CM | POA: Insufficient documentation

## 2020-08-08 NOTE — Assessment & Plan Note (Signed)
-  Continues to benefit from supplemental oxygen - O2 95% 4L today

## 2020-08-08 NOTE — Telephone Encounter (Signed)
Called and spoke with Patient. Patient stated his Garrett Peterson is scheduled to be delivered 08/09/20.  Patient is concerned about possible side effects. Advised Patient to call if he experiences any possible side effects.  Message routed to Dr. Chase Caller as Juluis Rainier

## 2020-08-08 NOTE — Assessment & Plan Note (Addendum)
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 Pulmonary testing today showed moderate-severe restriction with diffusion defect/ FEV1 1.55 (55%), DLCOcor 52%. He is on an extended prednisone taper, current dose 61m x2 weeks. He reports improvement in breathing. Patient to stop Bactrim once he gets to 571mprednisone. Started on OFEV, awaiting insurance approval and medication delivery. Re-referring to cardiology for right heart cath and pulmonary rehab. FU in April with Dr. RaChase Caller

## 2020-08-08 NOTE — Telephone Encounter (Signed)
Thanks and correct. Ensure he has followup with app/me in 2-3 weeks from ofev start date. Close followup for safety monitopring. Side effects are reversible

## 2020-08-09 NOTE — Telephone Encounter (Signed)
ATC no voice mail is set up on this line. Will attempt to call at later time.

## 2020-08-10 ENCOUNTER — Other Ambulatory Visit: Payer: Self-pay

## 2020-08-10 ENCOUNTER — Encounter: Payer: Medicare Other | Admitting: *Deleted

## 2020-08-10 VITALS — Ht 68.9 in | Wt 228.0 lb

## 2020-08-10 DIAGNOSIS — J849 Interstitial pulmonary disease, unspecified: Secondary | ICD-10-CM | POA: Diagnosis not present

## 2020-08-10 DIAGNOSIS — J9611 Chronic respiratory failure with hypoxia: Secondary | ICD-10-CM | POA: Diagnosis not present

## 2020-08-10 NOTE — Progress Notes (Signed)
Pt desaturated fairly quickly during walk test.  We stopped test at 3 min.  Note sent to MD for review.   08/10/20 0952  6 Minute Walk  Phase Initial  Distance 400 feet  Walk Time 3 minutes (stopped due to desaturation)  # of Rest Breaks 0  MPH 1.52  METS 0.66  RPE 13  Perceived Dyspnea  3  VO2 Peak 2.29  Symptoms Yes (comment)  Comments SOB, chest tightness 4/10  Resting HR 57 bpm  Resting BP 132/64  Resting Oxygen Saturation  98 %  Exercise Oxygen Saturation  during 6 min walk 79 %  Max Ex. HR 89 bpm  Max Ex. BP 148/74  2 Minute Post BP 136/70  Interval HR  Interval Heart Rate? Yes  1 Minute HR 74  2 Minute HR 84  3 Minute HR 89  2 Minute Post HR 71  Interval Oxygen  Interval Oxygen? Yes  Baseline Oxygen Saturation % 98 %  1 Minute Oxygen Saturation % 89 %  1 Minute Liters of Oxygen 4 L (continuous)  2 Minute Oxygen Saturation % 83 %  2 Minute Liters of Oxygen 4 L  3 Minute Oxygen Saturation % 79 %  3 Minute Liters of Oxygen 4 L  2 Minute Post Oxygen Saturation % 97 %  2 Minute Post Liters of Oxygen 4 L   Alberteen Sam, MA, [place], CCRP 08/10/2020 9:59 AM

## 2020-08-10 NOTE — Progress Notes (Signed)
Pulmonary Individual Treatment Plan  Patient Details  Name: Garrett Peterson MRN: [id number] Date of Birth: February 21, 1946 Referring Provider:   Flowsheet Row Pulmonary Rehab from 08/10/2020 in [facility] Cardiac and Pulmonary Rehab  Referring Provider Brand Males MD      Initial Encounter Date:  Flowsheet Row Pulmonary Rehab from 08/10/2020 in [facility] Cardiac and Pulmonary Rehab  Date 08/10/20      Visit Diagnosis: ILD (interstitial lung disease) ([place])  Chronic hypoxemic respiratory failure ([place])  Patient's Home Medications on Admission:  Current Outpatient Medications:  .  albuterol (VENTOLIN HFA) 108 (90 Base) MCG/ACT inhaler, Inhale 2 puffs into the lungs every 6 (six) hours as needed for wheezing or shortness of breath. (Patient not taking: Reported on 08/02/2020), Disp: 1 each, Rfl: 0 .  atorvastatin (LIPITOR) 20 MG tablet, Take 20 mg by mouth daily., Disp: , Rfl:  .  capecitabine (XELODA) 500 MG tablet, Take 3 tablets (1,500 mg total) by mouth 2 (two) times daily after a meal. Take Monday through Friday during radiation. (Patient not taking: Reported on 08/02/2020), Disp: 126 tablet, Rfl: 0 .  dextromethorphan-guaiFENesin (MUCINEX DM) 30-600 MG 12hr tablet, Take 1 tablet by mouth 2 (two) times daily as needed for cough. (Patient not taking: Reported on 08/02/2020), Disp: 60 tablet, Rfl: 0 .  lidocaine-prilocaine (EMLA) cream, Apply to affected area once (Patient not taking: Reported on 08/02/2020), Disp: 30 g, Rfl: 3 .  metoprolol tartrate (LOPRESSOR) 25 MG tablet, Take 25 mg by mouth 2 (two) times daily., Disp: , Rfl:  .  predniSONE (STERAPRED UNI-PAK 21 TAB) 10 MG (21) TBPK tablet, Take by mouth daily. Take 72m for 2 weeks, 255mfor 2 weeks then 1079mor 2 weeks then 5mg69mily until further notice., Disp: 14 tablet, Rfl: 0 .  sertraline (ZOLOFT) 25 MG tablet, Take 1 tablet (25 mg total) by mouth daily. (Patient not taking: Reported on 08/02/2020), Disp: 30 tablet, Rfl: 0 .   sulfamethoxazole-trimethoprim (BACTRIM) 400-80 MG tablet, Take 1 tablet by mouth every 12 (twelve) hours., Disp: 60 tablet, Rfl: 0 .  temazepam (RESTORIL) 30 MG capsule, Take 30 mg by mouth at bedtime. , Disp: , Rfl:   Past Medical History: Past Medical History:  Diagnosis Date  . Actinic keratosis   . Adenomatous polyp of colon   . Arthritis   . Basal cell carcinoma   . CAD (coronary artery disease)   . 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 Hyperlipidemia   . Hypertension   . Pneumonia   . Pre-diabetes   . Respiratory failure with hypoxia (HCC)     Tobacco Use: Social History   Tobacco Use  Smoking Status Never Smoker  Smokeless Tobacco Former UserSystems developerTypes: Chew    Labs: Recent Review Flowsheet Data   There is no flowsheet data to display.      Pulmonary Assessment Scores:  Pulmonary Assessment Scores    Row Name 08/10/20 1004         ADL UCSD   ADL Phase Entry     SOB Score total 66     Rest 0     Walk 1     Stairs 4     Bath 1     Dress 1     Shop 3           CAT Score   CAT Score 18           mMRC Score   mMRC Score 4  UCSD: Self-administered rating of dyspnea associated with activities of daily living (ADLs) 6-point scale (0 = "not at all" to 5 = "maximal or unable to do because of breathlessness")  Scoring Scores range from 0 to 120.  Minimally important difference is 5 units  CAT: CAT can identify the health impairment of COPD patients and is better correlated with disease progression.  CAT has a scoring range of zero to 40. The CAT score is classified into four groups of low (less than 10), medium (10 - 20), high (21-30) and very high (31-40) based on the impact level of disease on health status. A CAT score over 10 suggests significant symptoms.  A worsening CAT score could be explained by an exacerbation, poor medication adherence, poor inhaler technique, or progression of COPD or comorbid conditions.  CAT MCID is 2  points  mMRC: mMRC (Modified Medical Research Council) Dyspnea Scale is used to assess the degree of baseline functional disability in patients of respiratory disease due to dyspnea. No minimal important difference is established. A decrease in score of 1 point or greater is considered a positive change.   Pulmonary Function Assessment:   Exercise Target Goals: Exercise Program Goal: Individual exercise prescription set using results from initial 6 min walk test and THRR while considering  patient's activity barriers and safety.   Exercise Prescription Goal: Initial exercise prescription builds to 30-45 minutes a day of aerobic activity, 2-3 days per week.  Home exercise guidelines will be given to patient during program as part of exercise prescription that the participant will acknowledge.  Education: Aerobic Exercise: - Group verbal and visual presentation on the components of exercise prescription. Introduces F.I.T.T principle from ACSM for exercise prescriptions.  Reviews F.I.T.T. principles of aerobic exercise including progression. Written material given at graduation. Flowsheet Row Pulmonary Rehab from 08/10/2020 in [facility] Cardiac and Pulmonary Rehab  Education need identified 08/10/20      Education: Resistance Exercise: - Group verbal and visual presentation on the components of exercise prescription. Introduces F.I.T.T principle from ACSM for exercise prescriptions  Reviews F.I.T.T. principles of resistance exercise including progression. Written material given at graduation.    Education: Exercise & Equipment Safety: - Individual verbal instruction and demonstration of equipment use and safety with use of the equipment. Flowsheet Row Pulmonary Rehab from 08/10/2020 in [facility] Cardiac and Pulmonary Rehab  Date 08/10/20  Educator [facility]  Instruction Review Code 1- Verbalizes Understanding      Education: Exercise Physiology & General Exercise Guidelines: - Group verbal and written  instruction with models to review the exercise physiology of the cardiovascular system and associated critical values. Provides general exercise guidelines with specific guidelines to those with heart or lung disease.    Education: Flexibility, Balance, Mind/Body Relaxation: - Group verbal and visual presentation with interactive activity on the components of exercise prescription. Introduces F.I.T.T principle from ACSM for exercise prescriptions. Reviews F.I.T.T. principles of flexibility and balance exercise training including progression. Also discusses the mind body connection.  Reviews various relaxation techniques to help reduce and manage stress (i.e. Deep breathing, progressive muscle relaxation, and visualization). Balance handout provided to take home. Written material given at graduation.   Activity Barriers & Risk Stratification:  Activity Barriers & Cardiac Risk Stratification - 08/10/20 0954      Activity Barriers & Cardiac Risk Stratification   Activity Barriers Deconditioning;Shortness of Breath;Muscular Weakness;Balance Concerns           6 Minute Walk:  6 Minute Walk    Row Name  08/10/20 0952         6 Minute Walk   Phase Initial     Distance 400 feet     Walk Time 3 minutes  stopped due to desaturation     # of Rest Breaks 0     MPH 1.52     METS 0.66     RPE 13     Perceived Dyspnea  3     VO2 Peak 2.29     Symptoms Yes (comment)     Comments SOB, chest tightness 4/10     Resting HR 57 bpm     Resting BP 132/64     Resting Oxygen Saturation  98 %     Exercise Oxygen Saturation  during 6 min walk 79 %     Max Ex. HR 89 bpm     Max Ex. BP 148/74     2 Minute Post BP 136/70           Interval HR   1 Minute HR 74     2 Minute HR 84     3 Minute HR 89     2 Minute Post HR 71     Interval Heart Rate? Yes           Interval Oxygen   Interval Oxygen? Yes     Baseline Oxygen Saturation % 98 %     1 Minute Oxygen Saturation % 89 %     1 Minute Liters  of Oxygen 4 L  continuous     2 Minute Oxygen Saturation % 83 %     2 Minute Liters of Oxygen 4 L     3 Minute Oxygen Saturation % 79 %     3 Minute Liters of Oxygen 4 L     2 Minute Post Oxygen Saturation % 97 %     2 Minute Post Liters of Oxygen 4 L           Oxygen Initial Assessment:  Oxygen Initial Assessment - 08/10/20 1003      Home Oxygen   Home Oxygen Device E-Tanks    Sleep Oxygen Prescription Continuous    Liters per minute 4    Home Exercise Oxygen Prescription Continuous    Liters per minute 4    Home Resting Oxygen Prescription Continuous    Liters per minute 4    Compliance with Home Oxygen Use Yes      Initial 6 min Walk   Oxygen Used Continuous;E-Tanks    Liters per minute 4      Program Oxygen Prescription   Program Oxygen Prescription Continuous;E-Tanks    Liters per minute 4      Intervention   Short Term Goals To learn and exhibit compliance with exercise, home and travel O2 prescription;To learn and understand importance of monitoring SPO2 with pulse oximeter and demonstrate accurate use of the pulse oximeter.;To learn and understand importance of maintaining oxygen saturations>88%;To learn and demonstrate proper use of respiratory medications;To learn and demonstrate proper pursed lip breathing techniques or other breathing techniques.    Long  Term Goals Exhibits compliance with exercise, home and travel O2 prescription;Verbalizes importance of monitoring SPO2 with pulse oximeter and return demonstration;Maintenance of O2 saturations>88%;Exhibits proper breathing techniques, such as pursed lip breathing or other method taught during program session;Compliance with respiratory medication;Demonstrates proper use of MDI's           Oxygen Re-Evaluation:   Oxygen Discharge (Final Oxygen Re-Evaluation):  Initial Exercise Prescription:  Initial Exercise Prescription - 08/10/20 0900      Date of Initial Exercise RX and Referring Provider   Date  08/10/20    Referring Provider Brand Males MD      Oxygen   Oxygen Continuous    Liters 4      Treadmill   MPH 1.5    Grade 0    Minutes 15   2 min walk 1 min rest   METs 2.15      Recumbant Bike   Level 1    RPM 50    Watts 10    Minutes 15    METs 2      NuStep   Level 1    SPM 80    Minutes 15    METs 2      Prescription Details   Frequency (times per week) 3    Duration Progress to 30 minutes of continuous aerobic without signs/symptoms of physical distress      Intensity   THRR 40-80% of Max Heartrate 92-127    Ratings of Perceived Exertion 11-13    Perceived Dyspnea 0-4      Progression   Progression Continue to progress workloads to maintain intensity without signs/symptoms of physical distress.      Resistance Training   Training Prescription Yes    Weight 3 lb    Reps 10-15           Perform Capillary Blood Glucose checks as needed.  Exercise Prescription Changes:  Exercise Prescription Changes    Row Name 08/10/20 0900             Response to Exercise   Blood Pressure (Admit) 132/64       Blood Pressure (Exercise) 148/74       Blood Pressure (Exit) 124/72       Heart Rate (Admit) 57 bpm       Heart Rate (Exercise) 89 bpm       Heart Rate (Exit) 68 bpm       Oxygen Saturation (Admit) 98 %       Oxygen Saturation (Exercise) 79 %       Oxygen Saturation (Exit) 95 %       Rating of Perceived Exertion (Exercise) 13       Perceived Dyspnea (Exercise) 3       Symptoms SOB, chest tightness 4/10       Comments walk test results              Exercise Comments:   Exercise Goals and Review:  Exercise Goals    Row Name 08/10/20 1000             Exercise Goals   Increase Physical Activity Yes       Intervention Provide advice, education, support and counseling about physical activity/exercise needs.;Develop an individualized exercise prescription for aerobic and resistive training based on initial evaluation findings, risk  stratification, comorbidities and participant's personal goals.       Expected Outcomes Short Term: Attend rehab on a regular basis to increase amount of physical activity.;Long Term: Add in home exercise to make exercise part of routine and to increase amount of physical activity.;Long Term: Exercising regularly at least 3-5 days a week.       Increase Strength and Stamina Yes       Intervention Develop an individualized exercise prescription for aerobic and resistive training based on initial evaluation findings, risk stratification, comorbidities and participant's  personal goals.;Provide advice, education, support and counseling about physical activity/exercise needs.       Expected Outcomes Short Term: Increase workloads from initial exercise prescription for resistance, speed, and METs.;Short Term: Perform resistance training exercises routinely during rehab and add in resistance training at home;Long Term: Improve cardiorespiratory fitness, muscular endurance and strength as measured by increased METs and functional capacity (6MWT)       Able to understand and use rate of perceived exertion (RPE) scale Yes       Intervention Provide education and explanation on how to use RPE scale       Expected Outcomes Short Term: Able to use RPE daily in rehab to express subjective intensity level;Long Term:  Able to use RPE to guide intensity level when exercising independently       Able to understand and use Dyspnea scale Yes       Intervention Provide education and explanation on how to use Dyspnea scale       Expected Outcomes Short Term: Able to use Dyspnea scale daily in rehab to express subjective sense of shortness of breath during exertion;Long Term: Able to use Dyspnea scale to guide intensity level when exercising independently       Knowledge and understanding of Target Heart Rate Range (THRR) Yes       Intervention Provide education and explanation of THRR including how the numbers were predicted  and where they are located for reference       Expected Outcomes Short Term: Able to state/look up THRR;Short Term: Able to use daily as guideline for intensity in rehab;Long Term: Able to use THRR to govern intensity when exercising independently       Able to check pulse independently Yes       Intervention Provide education and demonstration on how to check pulse in carotid and radial arteries.;Review the importance of being able to check your own pulse for safety during independent exercise       Expected Outcomes Short Term: Able to explain why pulse checking is important during independent exercise;Long Term: Able to check pulse independently and accurately       Understanding of Exercise Prescription Yes       Intervention Provide education, explanation, and written materials on patient's individual exercise prescription       Expected Outcomes Short Term: Able to explain program exercise prescription;Long Term: Able to explain home exercise prescription to exercise independently              Exercise Goals Re-Evaluation :   Discharge Exercise Prescription (Final Exercise Prescription Changes):  Exercise Prescription Changes - 08/10/20 0900      Response to Exercise   Blood Pressure (Admit) 132/64    Blood Pressure (Exercise) 148/74    Blood Pressure (Exit) 124/72    Heart Rate (Admit) 57 bpm    Heart Rate (Exercise) 89 bpm    Heart Rate (Exit) 68 bpm    Oxygen Saturation (Admit) 98 %    Oxygen Saturation (Exercise) 79 %    Oxygen Saturation (Exit) 95 %    Rating of Perceived Exertion (Exercise) 13    Perceived Dyspnea (Exercise) 3    Symptoms SOB, chest tightness 4/10    Comments walk test results           Nutrition:  Target Goals: Understanding of nutrition guidelines, daily intake of sodium <1524m, cholesterol <2018m calories 30% from fat and 7% or less from saturated fats, daily to have 5 or more  servings of fruits and vegetables.  Education: All About  Nutrition: -Group instruction provided by verbal, written material, interactive activities, discussions, models, and posters to present general guidelines for heart healthy nutrition including fat, fiber, MyPlate, the role of sodium in heart healthy nutrition, utilization of the nutrition label, and utilization of this knowledge for meal planning. Follow up email sent as well. Written material given at graduation.   Biometrics:  Pre Biometrics - 08/10/20 1001      Pre Biometrics   Height 5' 8.9" (1.75 m)    Weight 228 lb (103.4 kg)    BMI (Calculated) 33.77    Single Leg Stand 5.4 seconds            Nutrition Therapy Plan and Nutrition Goals:   Nutrition Assessments:  MEDIFICTS Score Key:  ?70 Need to make dietary changes   40-70 Heart Healthy Diet  ? 40 Therapeutic Level Cholesterol Diet  Flowsheet Row Pulmonary Rehab from 08/10/2020 in [facility] Cardiac and Pulmonary Rehab  Picture Your Plate Total Score on Admission 55     Picture Your Plate Scores:  <53 Unhealthy dietary pattern with much room for improvement.  41-50 Dietary pattern unlikely to meet recommendations for good health and room for improvement.  51-60 More healthful dietary pattern, with some room for improvement.   >60 Healthy dietary pattern, although there may be some specific behaviors that could be improved.   Nutrition Goals Re-Evaluation:   Nutrition Goals Discharge (Final Nutrition Goals Re-Evaluation):   Psychosocial: Target Goals: Acknowledge presence or absence of significant depression and/or stress, maximize coping skills, provide positive support system. Participant is able to verbalize types and ability to use techniques and skills needed for reducing stress and depression.   Education: Stress, Anxiety, and Depression - Group verbal and visual presentation to define topics covered.  Reviews how body is impacted by stress, anxiety, and depression.  Also discusses healthy ways to reduce  stress and to treat/manage anxiety and depression.  Written material given at graduation.   Education: Sleep Hygiene -Provides group verbal and written instruction about how sleep can affect your health.  Define sleep hygiene, discuss sleep cycles and impact of sleep habits. Review good sleep hygiene tips.    Initial Review & Psychosocial Screening:  Initial Psych Review & Screening - 08/04/20 1508      Initial Review   Current issues with Current Stress Concerns;Current Sleep Concerns    Source of Stress Concerns Unable to participate in former interests or hobbies;Unable to perform yard/household activities;Chronic Illness      Family Dynamics   Good Support System? Yes   God, wife, medical team     Barriers   Psychosocial barriers to participate in program There are no identifiable barriers or psychosocial needs.      Screening Interventions   Interventions Provide feedback about the scores to participant;Encouraged to exercise;To provide support and resources with identified psychosocial needs    Expected Outcomes Short Term goal: Utilizing psychosocial counselor, staff and physician to assist with identification of specific Stressors or current issues interfering with healing process. Setting desired goal for each stressor or current issue identified.;Long Term Goal: Stressors or current issues are controlled or eliminated.;Short Term goal: Identification and review with participant of any Quality of Life or Depression concerns found by scoring the questionnaire.;Long Term goal: The participant improves quality of Life and PHQ9 Scores as seen by post scores and/or verbalization of changes           Quality of  Life Scores:  Scores of 19 and below usually indicate a poorer quality of life in these areas.  A difference of  2-3 points is a clinically meaningful difference.  A difference of 2-3 points in the total score of the Quality of Life Index has been associated with significant  improvement in overall quality of life, self-image, physical symptoms, and general health in studies assessing change in quality of life.  PHQ-9: Recent Review Flowsheet Data    Depression screen [facility] 2/9 08/10/2020   Decreased Interest 0   Down, Depressed, Hopeless 0   PHQ - 2 Score 0   Altered sleeping 1   Tired, decreased energy 1   Change in appetite 0   Feeling bad or failure about yourself  0   Trouble concentrating 0   Moving slowly or fidgety/restless 0   Suicidal thoughts 0   PHQ-9 Score 2   Difficult doing work/chores Not difficult at all     Interpretation of Total Score  Total Score Depression Severity:  1-4 = Minimal depression, 5-9 = Mild depression, 10-14 = Moderate depression, 15-19 = Moderately severe depression, 20-27 = Severe depression   Psychosocial Evaluation and Intervention:  Psychosocial Evaluation - 08/04/20 1517      Psychosocial Evaluation & Interventions   Interventions Encouraged to exercise with the program and follow exercise prescription;Stress management education;Relaxation education    Comments Mr. Mccready has had a complicated medical history this last year and a half. He was diagnosed with colon CA last year and then randomnly started experiencing severe shortness of breaht this last fall. 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 He used to spend a lot of his time outdoors doing different activities, but now can't due to the oxygen tanks and shortness of breath. His sleep pattern has been unusual for the last few years with no recent change, where he sleeps only a little some nights and then up the rest of the night. 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 He received news recently that he was cancer free so now he is focused on his breathing issues. His main goal is to feel better and hopefully be able to wear a concentrator so he can do more of his hobbies outside.    Expected  Outcomes Short; attend pulmonary rehab for education and exercise. Long: develop positive self care habits.    Continue Psychosocial Services  Follow up required by staff           Psychosocial Re-Evaluation:   Psychosocial Discharge (Final Psychosocial Re-Evaluation):   Education: Education Goals: Education classes will be provided on a weekly basis, covering required topics. Participant will state understanding/return demonstration of topics presented.  Learning Barriers/Preferences:  Learning Barriers/Preferences - 08/04/20 1508      Learning Barriers/Preferences   Learning Barriers None    Learning Preferences None           General Pulmonary Education Topics:  Infection Prevention: - Provides verbal and written material to individual with discussion of infection control including proper hand washing and proper equipment cleaning during exercise session. Flowsheet Row Pulmonary Rehab from 08/10/2020 in [facility] Cardiac and Pulmonary Rehab  Date 08/10/20  Educator [facility]  Instruction Review Code 1- Verbalizes Understanding      Falls Prevention: - Provides verbal and written material to individual with discussion of falls prevention and safety. Flowsheet Row Pulmonary Rehab from 08/10/2020 in [facility] Cardiac and Pulmonary  Rehab  Date 08/10/20  Educator [facility]  Instruction Review Code 1- Verbalizes Understanding      Chronic Lung Disease Review: - Group verbal instruction with posters, models, PowerPoint presentations and videos,  to review new updates, new respiratory medications, new advancements in procedures and treatments. Providing information on websites and "800" numbers for continued self-education. Includes information about supplement oxygen, available portable oxygen systems, continuous and intermittent flow rates, oxygen safety, concentrators, and Medicare reimbursement for oxygen. Explanation of Pulmonary Drugs, including class, frequency, complications, importance of  spacers, rinsing mouth after steroid MDI's, and proper cleaning methods for nebulizers. Review of basic lung anatomy and physiology related to function, structure, and complications of lung disease. Review of risk factors. Discussion about methods for diagnosing sleep apnea and types of masks and machines for OSA. Includes a review of the use of types of environmental controls: home humidity, furnaces, filters, dust mite/pet prevention, HEPA vacuums. Discussion about weather changes, air quality and the benefits of nasal washing. Instruction on Warning signs, infection symptoms, calling MD promptly, preventive modes, and value of vaccinations. Review of effective airway clearance, coughing and/or vibration techniques. Emphasizing that all should Create an Action Plan. Written material given at graduation. Flowsheet Row Pulmonary Rehab from 08/10/2020 in [facility] Cardiac and Pulmonary Rehab  Education need identified 08/10/20      AED/CPR: - Group verbal and written instruction with the use of models to demonstrate the basic use of the AED with the basic ABC's of resuscitation.    Anatomy and Cardiac Procedures: - Group verbal and visual presentation and models provide information about basic cardiac anatomy and function. Reviews the testing methods done to diagnose heart disease and the outcomes of the test results. Describes the treatment choices: Medical Management, Angioplasty, or Coronary Bypass Surgery for treating various heart conditions including Myocardial Infarction, Angina, Valve Disease, and Cardiac Arrhythmias.  Written material given at graduation.   Medication Safety: - Group verbal and visual instruction to review commonly prescribed medications for heart and lung disease. Reviews the medication, class of the drug, and side effects. Includes the steps to properly store meds and maintain the prescription regimen.  Written material given at graduation.   Other: -Provides group and verbal  instruction on various topics (see comments)   Knowledge Questionnaire Score:  Knowledge Questionnaire Score - 08/10/20 1002      Knowledge Questionnaire Score   Pre Score 13/15 Education Focus: O2 safety, exercise            Core Components/Risk Factors/Patient Goals at Admission:  Personal Goals and Risk Factors at Admission - 08/10/20 1002      Core Components/Risk Factors/Patient Goals on Admission    Weight Management Yes;Obesity;Weight Loss    Intervention Weight Management: Develop a combined nutrition and exercise program designed to reach desired caloric intake, while maintaining appropriate intake of nutrient and fiber, sodium and fats, and appropriate energy expenditure required for the weight goal.;Weight Management: Provide education and appropriate resources to help participant work on and attain dietary goals.;Weight Management/Obesity: Establish reasonable short term and long term weight goals.;Obesity: Provide education and appropriate resources to help participant work on and attain dietary goals.    Admit Weight 228 lb (103.4 kg)    Goal Weight: Short Term 220 lb (99.8 kg)    Goal Weight: Long Term 215 lb (97.5 kg)    Expected Outcomes Short Term: Continue to assess and modify interventions until short term weight is achieved;Long Term: Adherence to nutrition and physical activity/exercise program aimed  toward attainment of established weight goal;Weight Loss: Understanding of general recommendations for a balanced deficit meal plan, which promotes 1-2 lb weight loss per week and includes a negative energy balance of [phone] kcal/d;Understanding recommendations for meals to include 15-35% energy as protein, 25-35% energy from fat, 35-60% energy from carbohydrates, less than 247m of dietary cholesterol, 20-35 gm of total fiber daily;Understanding of distribution of calorie intake throughout the day with the consumption of 4-5 meals/snacks    Improve shortness of breath with  ADL's Yes    Intervention Provide education, individualized exercise plan and daily activity instruction to help decrease symptoms of SOB with activities of daily living.    Expected Outcomes Short Term: Improve cardiorespiratory fitness to achieve a reduction of symptoms when performing ADLs;Long Term: Be able to perform more ADLs without symptoms or delay the onset of symptoms    Hypertension Yes    Intervention Provide education on lifestyle modifcations including regular physical activity/exercise, weight management, moderate sodium restriction and increased consumption of fresh fruit, vegetables, and low fat dairy, alcohol moderation, and smoking cessation.;Monitor prescription use compliance.    Expected Outcomes Short Term: Continued assessment and intervention until BP is < 140/950mHG in hypertensive participants. < 130/8065mG in hypertensive participants with diabetes, heart failure or chronic kidney disease.;Long Term: Maintenance of blood pressure at goal levels.    Lipids Yes    Intervention Provide education and support for participant on nutrition & aerobic/resistive exercise along with prescribed medications to achieve LDL <32m33mDL >40mg16m Expected Outcomes Short Term: Participant states understanding of desired cholesterol values and is compliant with medications prescribed. Participant is following exercise prescription and nutrition guidelines.;Long Term: Cholesterol controlled with medications as prescribed, with individualized exercise RX and with personalized nutrition plan. Value goals: LDL < 32mg,15m > 40 mg.           Education:Diabetes - Individual verbal and written instruction to review signs/symptoms of diabetes, desired ranges of glucose level fasting, after meals and with exercise. Acknowledge that pre and post exercise glucose checks will be done for 3 sessions at entry of program.   Know Your Numbers and Heart Failure: - Group verbal and visual instruction to  discuss disease risk factors for cardiac and pulmonary disease and treatment options.  Reviews associated critical values for Overweight/Obesity, Hypertension, Cholesterol, and Diabetes.  Discusses basics of heart failure: signs/symptoms and treatments.  Introduces Heart Failure Zone chart for action plan for heart failure.  Written material given at graduation.   Core Components/Risk Factors/Patient Goals Review:    Core Components/Risk Factors/Patient Goals at Discharge (Final Review):    ITP Comments:  ITP Comments    Row Name 08/04/20 1504 08/10/20 0950         ITP Comments Initial telephone orientation completed. Diagnosis can be found in [facility] EP orientation scheduled for Thursday 3/31 at 8am. Completed 6MWT and gym orientation. Initial ITP created and sent for review to Dr. Mark MEmily Filbertcal Director.             Comments: Initial ITP

## 2020-08-10 NOTE — Patient Instructions (Signed)
Patient Instructions  Patient Details  Name: Garrett Peterson MRN: [id number] Date of Birth: 03/07/1946 Referring Provider:  Brand Males, MD  Below are your personal goals for exercise, nutrition, and risk factors. Our goal is to help you stay on track towards obtaining and maintaining these goals. We will be discussing your progress on these goals with you throughout the program.  Initial Exercise Prescription:  Initial Exercise Prescription - 08/10/20 0900      Date of Initial Exercise RX and Referring Provider   Date 08/10/20    Referring Provider Brand Males MD      Oxygen   Oxygen Continuous    Liters 4      Treadmill   MPH 1.5    Grade 0    Minutes 15   2 min walk 1 min rest   METs 2.15      Recumbant Bike   Level 1    RPM 50    Watts 10    Minutes 15    METs 2      NuStep   Level 1    SPM 80    Minutes 15    METs 2      Prescription Details   Frequency (times per week) 3    Duration Progress to 30 minutes of continuous aerobic without signs/symptoms of physical distress      Intensity   THRR 40-80% of Max Heartrate 92-127    Ratings of Perceived Exertion 11-13    Perceived Dyspnea 0-4      Progression   Progression Continue to progress workloads to maintain intensity without signs/symptoms of physical distress.      Resistance Training   Training Prescription Yes    Weight 3 lb    Reps 10-15           Exercise Goals: Frequency: Be able to perform aerobic exercise two to three times per week in program working toward 2-5 days per week of home exercise.  Intensity: Work with a perceived exertion of 11 (fairly light) - 15 (hard) while following your exercise prescription.  We will make changes to your prescription with you as you progress through the program.   Duration: Be able to do 30 to 45 minutes of continuous aerobic exercise in addition to a 5 minute warm-up and a 5 minute cool-down routine.   Nutrition Goals: Your personal  nutrition goals will be established when you do your nutrition analysis with the dietician.  The following are general nutrition guidelines to follow: Cholesterol < 263m/day Sodium < 15016mday Fiber: Men over 50 yrs - 30 grams per day  Personal Goals:  Personal Goals and Risk Factors at Admission - 08/10/20 1002      Core Components/Risk Factors/Patient Goals on Admission    Weight Management Yes;Obesity;Weight Loss    Intervention Weight Management: Develop a combined nutrition and exercise program designed to reach desired caloric intake, while maintaining appropriate intake of nutrient and fiber, sodium and fats, and appropriate energy expenditure required for the weight goal.;Weight Management: Provide education and appropriate resources to help participant work on and attain dietary goals.;Weight Management/Obesity: Establish reasonable short term and long term weight goals.;Obesity: Provide education and appropriate resources to help participant work on and attain dietary goals.    Admit Weight 228 lb (103.4 kg)    Goal Weight: Short Term 220 lb (99.8 kg)    Goal Weight: Long Term 215 lb (97.5 kg)    Expected Outcomes Short Term: Continue to assess  and modify interventions until short term weight is achieved;Long Term: Adherence to nutrition and physical activity/exercise program aimed toward attainment of established weight goal;Weight Loss: Understanding of general recommendations for a balanced deficit meal plan, which promotes 1-2 lb weight loss per week and includes a negative energy balance of [phone] kcal/d;Understanding recommendations for meals to include 15-35% energy as protein, 25-35% energy from fat, 35-60% energy from carbohydrates, less than 250m of dietary cholesterol, 20-35 gm of total fiber daily;Understanding of distribution of calorie intake throughout the day with the consumption of 4-5 meals/snacks    Improve shortness of breath with ADL's Yes    Intervention Provide  education, individualized exercise plan and daily activity instruction to help decrease symptoms of SOB with activities of daily living.    Expected Outcomes Short Term: Improve cardiorespiratory fitness to achieve a reduction of symptoms when performing ADLs;Long Term: Be able to perform more ADLs without symptoms or delay the onset of symptoms    Hypertension Yes    Intervention Provide education on lifestyle modifcations including regular physical activity/exercise, weight management, moderate sodium restriction and increased consumption of fresh fruit, vegetables, and low fat dairy, alcohol moderation, and smoking cessation.;Monitor prescription use compliance.    Expected Outcomes Short Term: Continued assessment and intervention until BP is < 140/927mHG in hypertensive participants. < 130/8027mG in hypertensive participants with diabetes, heart failure or chronic kidney disease.;Long Term: Maintenance of blood pressure at goal levels.    Lipids Yes    Intervention Provide education and support for participant on nutrition & aerobic/resistive exercise along with prescribed medications to achieve LDL <48m55mDL >40mg44m Expected Outcomes Short Term: Participant states understanding of desired cholesterol values and is compliant with medications prescribed. Participant is following exercise prescription and nutrition guidelines.;Long Term: Cholesterol controlled with medications as prescribed, with individualized exercise RX and with personalized nutrition plan. Value goals: LDL < 48mg,32m > 40 mg.           Tobacco Use Initial Evaluation: Social History   Tobacco Use  Smoking Status Never Smoker  Smokeless Tobacco Former User  Systems developerpes: Chew    Exercise Goals and Review:  Exercise Goals    Row Name 08/10/20 1000             Exercise Goals   Increase Physical Activity Yes       Intervention Provide advice, education, support and counseling about physical activity/exercise  needs.;Develop an individualized exercise prescription for aerobic and resistive training based on initial evaluation findings, risk stratification, comorbidities and participant's personal goals.       Expected Outcomes Short Term: Attend rehab on a regular basis to increase amount of physical activity.;Long Term: Add in home exercise to make exercise part of routine and to increase amount of physical activity.;Long Term: Exercising regularly at least 3-5 days a week.       Increase Strength and Stamina Yes       Intervention Develop an individualized exercise prescription for aerobic and resistive training based on initial evaluation findings, risk stratification, comorbidities and participant's personal goals.;Provide advice, education, support and counseling about physical activity/exercise needs.       Expected Outcomes Short Term: Increase workloads from initial exercise prescription for resistance, speed, and METs.;Short Term: Perform resistance training exercises routinely during rehab and add in resistance training at home;Long Term: Improve cardiorespiratory fitness, muscular endurance and strength as measured by increased METs and functional capacity (6MWT)       Able to  understand and use rate of perceived exertion (RPE) scale Yes       Intervention Provide education and explanation on how to use RPE scale       Expected Outcomes Short Term: Able to use RPE daily in rehab to express subjective intensity level;Long Term:  Able to use RPE to guide intensity level when exercising independently       Able to understand and use Dyspnea scale Yes       Intervention Provide education and explanation on how to use Dyspnea scale       Expected Outcomes Short Term: Able to use Dyspnea scale daily in rehab to express subjective sense of shortness of breath during exertion;Long Term: Able to use Dyspnea scale to guide intensity level when exercising independently       Knowledge and understanding of  Target Heart Rate Range (THRR) Yes       Intervention Provide education and explanation of THRR including how the numbers were predicted and where they are located for reference       Expected Outcomes Short Term: Able to state/look up THRR;Short Term: Able to use daily as guideline for intensity in rehab;Long Term: Able to use THRR to govern intensity when exercising independently       Able to check pulse independently Yes       Intervention Provide education and demonstration on how to check pulse in carotid and radial arteries.;Review the importance of being able to check your own pulse for safety during independent exercise       Expected Outcomes Short Term: Able to explain why pulse checking is important during independent exercise;Long Term: Able to check pulse independently and accurately       Understanding of Exercise Prescription Yes       Intervention Provide education, explanation, and written materials on patient's individual exercise prescription       Expected Outcomes Short Term: Able to explain program exercise prescription;Long Term: Able to explain home exercise prescription to exercise independently              Copy of goals given to participant.

## 2020-08-10 NOTE — Telephone Encounter (Signed)
Pt aware to call if he has side effects. Pt has OV with MR on 4.21.22. Nothing further needed at this time.

## 2020-08-10 NOTE — Telephone Encounter (Signed)
Received notification from  [facility] regarding an approval for [place] patient assistance from 08/07/20 to 05/12/21.   Patient's 1st shipment was shipped and delivered today.  Phone number: [phone]

## 2020-08-11 NOTE — Telephone Encounter (Addendum)
Subjective:   Patient called today for Initial appt with pharmacy team for Ofev counseling. Patient was last seen and referred by Dr. Chase Caller on 08/02/20. Pertinent past medical history includes ILD, CAD, HTN, chronic hypoxic respiratory failure. Patient naive to antifibrotics   Objective: No Known Allergies  Outpatient Encounter Medications as of 07/06/2020  Medication Sig  . albuterol (VENTOLIN HFA) 108 (90 Base) MCG/ACT inhaler Inhale 2 puffs into the lungs every 6 (six) hours as needed for wheezing or shortness of breath. (Patient not taking: Reported on 08/02/2020)  . capecitabine (XELODA) 500 MG tablet Take 3 tablets (1,500 mg total) by mouth 2 (two) times daily after a meal. Take Monday through Friday during radiation. (Patient not taking: Reported on 08/02/2020)  . dextromethorphan-guaiFENesin (MUCINEX DM) 30-600 MG 12hr tablet Take 1 tablet by mouth 2 (two) times daily as needed for cough. (Patient not taking: Reported on 08/02/2020)  . lidocaine-prilocaine (EMLA) cream Apply to affected area once (Patient not taking: Reported on 08/02/2020)  . metoprolol tartrate (LOPRESSOR) 25 MG tablet Take 25 mg by mouth 2 (two) times daily.  . predniSONE (STERAPRED UNI-PAK 21 TAB) 10 MG (21) TBPK tablet Take by mouth daily. Take 72m for 2 weeks, 270mfor 2 weeks then 1033mor 2 weeks then 5mg76mily until further notice.  . sertraline (ZOLOFT) 25 MG tablet Take 1 tablet (25 mg total) by mouth daily. (Patient not taking: Reported on 08/02/2020)  . sulfamethoxazole-trimethoprim (BACTRIM) 400-80 MG tablet Take 1 tablet by mouth every 12 (twelve) hours.  . temazepam (RESTORIL) 30 MG capsule Take 30 mg by mouth at bedtime.    No facility-administered encounter medications on file as of 07/06/2020.     Immunization History  Administered Date(s) Administered  . PFIZER(Purple Top)SARS-COV-2 Vaccination 01/18/2020  . Zoster Recombinat (Shingrix) 01/12/2018, 03/21/2018      PFT's No results found for:  FEV1, FVC, FEV1FVC, TLC, DLCO    CMP     Component Value Date/Time   NA 137 06/14/2020 1146   NA 142 09/20/2011 1104   K 5.2 (H) 06/14/2020 1146   K 3.8 09/20/2011 1104   CL 101 06/14/2020 1146   CL 107 09/20/2011 1104   CO2 26 06/14/2020 1146   CO2 28 09/20/2011 1104   GLUCOSE 279 (H) 06/14/2020 1146   GLUCOSE 119 (H) 09/20/2011 1104   BUN 18 06/14/2020 1146   BUN 12 09/20/2011 1104   CREATININE 1.20 06/14/2020 1146   CREATININE 0.99 09/20/2011 1104   CALCIUM 9.4 06/14/2020 1146   CALCIUM 8.8 09/20/2011 1104   PROT 7.5 06/14/2020 1146   ALBUMIN 3.9 06/14/2020 1146   AST 55 (H) 06/14/2020 1146   ALT 110 (H) 06/14/2020 1146   ALKPHOS 118 06/14/2020 1146   BILITOT 0.8 06/14/2020 1146   GFRNONAA >60 06/14/2020 1146   GFRNONAA >60 09/20/2011 1104   GFRAA >60 02/02/2020 0817   GFRAA >60 09/20/2011 1104      CBC    Component Value Date/Time   WBC 12.3 (H) 06/14/2020 1146   RBC 4.39 06/14/2020 1146   HGB 15.0 06/14/2020 1146   HGB 14.4 09/20/2011 1104   HCT 42.7 06/14/2020 1146   HCT 41.1 09/20/2011 1104   PLT 158 06/14/2020 1146   PLT 135 (L) 09/20/2011 1104   MCV 97.3 06/14/2020 1146   MCV 99 09/20/2011 1104   MCH 34.2 (H) 06/14/2020 1146   MCHC 35.1 06/14/2020 1146   RDW 12.9 06/14/2020 1146   RDW 12.9 09/20/2011 1104   LYMPHSABS  1.0 06/14/2020 1146   LYMPHSABS 1.9 09/20/2011 1104   MONOABS 0.5 06/14/2020 1146   MONOABS 0.4 09/20/2011 1104   EOSABS 0.1 06/14/2020 1146   EOSABS 0.2 09/20/2011 1104   BASOSABS 0.0 06/14/2020 1146   BASOSABS 0.1 09/20/2011 1104      LFT's Hepatic Function Latest Ref Rng & Units 06/14/2020 05/18/2020 05/17/2020  Total Protein 6.5 - 8.1 g/dL 7.5 6.9 6.7  Albumin 3.5 - 5.0 g/dL 3.9 3.4(L) 3.2(L)  AST 15 - 41 U/L 55(H) 32 37  ALT 0 - 44 U/L 110(H) 48(H) 52(H)  Alk Phosphatase 38 - 126 U/L 118 89 105  Total Bilirubin 0.3 - 1.2 mg/dL 0.8 1.3(H) 0.6     Assessment and Plan  1. Ofev Medication Management Thoroughly counseled  patient on the efficacy, mechanism of action, dosing, administration, adverse effects, and monitoring parameters of Ofev. Patient verbalized understanding.   -Will not stop or reverse the progression of ILD. It will SLOW the progression of ILD.  -Inhibits tyrosine kinase inhibitors which slow the fibrosis/progression of ILD -Significant reduction in the rate of disease progression was observed after treatment (61.1% [before] vs 33.3% [after], P?=?0.008) over 42 weeks. -Take 150 mg (one capsule) by mouth twice daily (approx 12 hours apart) -Take with food -Do not crush or split capsule -DIARRHEA IS WORSE WITH OFEV - TALK ABOUT LOPERAMIDE DOSING -Monitor for diarrhea, nausea and vomiting, GI perforation, hepatotoxicity  - Lab order placed for patient to have CMP drawn at [place] in [facility] - Fax [phone]). - Patient advised to hold Ofev until these labs result and I am in touch with him. - Will monitor LFTs (baseline, monthly for first 6 months, then every 3 months thereafter and as clinically indicated). - Ofev approved through patient assistance ([employer]) through 05/12/21. Received first shipment on 08/10/20. Patient provided with pharmacy phone number by [facility] and verbalized still having it  2. Medication Reconciliation A drug regimen assessment was performed, including review of allergies, interactions, disease-state management, dosing and immunization history. Medications were reviewed with the patient, including name, instructions, indication, goals of therapy, potential side effects, importance of adherence, and safe use   Patient counseled on purpose, proper use, and potential adverse effects including diarrhea, nausea, vomiting, abdominal pain, decreased appetite, weight loss, and increased blood pressure. Stressed the importance of routine lab monitoring. Will monitor LFT's every month for the first 6 months of treatment then every 3 months. Will monitor CBC  every 3 months.  Ofev dose will be 150 mg capsule every 12 hours with food. Stressed importance of taking with food to minimize stomach upset.   [name], PharmD, MPH Clinical Pharmacist (Rheumatology and Pulmonology)

## 2020-08-14 ENCOUNTER — Encounter: Payer: Medicare Other | Attending: Internal Medicine | Admitting: *Deleted

## 2020-08-14 DIAGNOSIS — J849 Interstitial pulmonary disease, unspecified: Secondary | ICD-10-CM | POA: Diagnosis present

## 2020-08-14 DIAGNOSIS — J9611 Chronic respiratory failure with hypoxia: Secondary | ICD-10-CM | POA: Insufficient documentation

## 2020-08-14 NOTE — Progress Notes (Signed)
Daily Session Note  Patient Details  Name: CHET GREENLEY MRN: [id number] Date of Birth: 1946-01-13 Referring Provider:   Flowsheet Row Pulmonary Rehab from 08/10/2020 in [facility] Cardiac and Pulmonary Rehab  Referring Provider Brand Males MD      Encounter Date: 08/14/2020  Check In:      Social History   Tobacco Use  Smoking Status Never Smoker  Smokeless Tobacco Former Systems developer  . Types: Chew    Goals Met:  Proper associated with RPD/PD & O2 Sat Exercise tolerated well Personal goals reviewed No report of cardiac concerns or symptoms  Goals Unmet:  Not Applicable  Comments: First full day of exercise!  Patient was oriented to gym and equipment including functions, settings, policies, and procedures.  Patient's individual exercise prescription and treatment plan were reviewed.  All starting workloads were established based on the results of the 6 minute walk test done at initial orientation visit.  The plan for exercise progression was also introduced and progression will be customized based on patient's performance and goals.    Dr. Emily Filbert is Medical Director for [place] and [facility].

## 2020-08-15 NOTE — Telephone Encounter (Signed)
Thank you. Closing message. Repl;y not needed

## 2020-08-16 ENCOUNTER — Other Ambulatory Visit: Payer: Self-pay

## 2020-08-16 DIAGNOSIS — J849 Interstitial pulmonary disease, unspecified: Secondary | ICD-10-CM

## 2020-08-16 DIAGNOSIS — J9611 Chronic respiratory failure with hypoxia: Secondary | ICD-10-CM

## 2020-08-16 LAB — COMPREHENSIVE METABOLIC PANEL
ALT: 45 IU/L — ABNORMAL HIGH (ref 0–44)
AST: 27 IU/L (ref 0–40)
Albumin/Globulin Ratio: 1.7 (ref 1.2–2.2)
Albumin: 4 g/dL (ref 3.7–4.7)
Alkaline Phosphatase: 96 IU/L (ref 44–121)
BUN/Creatinine Ratio: 13 (ref 10–24)
BUN: 13 mg/dL (ref 8–27)
Bilirubin Total: 0.6 mg/dL (ref 0.0–1.2)
CO2: 25 mmol/L (ref 20–29)
Calcium: 9.2 mg/dL (ref 8.6–10.2)
Chloride: 99 mmol/L (ref 96–106)
Creatinine, Ser: 0.97 mg/dL (ref 0.76–1.27)
Globulin, Total: 2.4 g/dL (ref 1.5–4.5)
Glucose: 182 mg/dL — ABNORMAL HIGH (ref 65–99)
Potassium: 3.9 mmol/L (ref 3.5–5.2)
Sodium: 139 mmol/L (ref 134–144)
Total Protein: 6.4 g/dL (ref 6.0–8.5)
eGFR: 81 mL/min/{1.73_m2} (ref >59)

## 2020-08-16 NOTE — Progress Notes (Signed)
Completed initial RD evaluation.  

## 2020-08-16 NOTE — Progress Notes (Signed)
Daily Session Note  Patient Details  Name: Garrett Peterson MRN: [id number] Date of Birth: [birth year] Referring Provider:   Flowsheet Row Pulmonary Rehab from 08/10/2020 in [facility] Cardiac and Pulmonary Rehab  Referring Provider Brand Males MD      Encounter Date: 08/16/2020  Check In:  Session Check In - 08/16/20 0727      Check-In   Supervising physician immediately available to respond to emergencies See telemetry face sheet for immediately available ER MD    Location [facility]-Cardiac & Pulmonary Rehab    Staff Present Birdie Sons, MPA, RN;Amanda Oletta Darter, BA, ACSM CEP, Exercise Physiologist;Joseph Tessie Fass RCP,RRT,BSRT    Virtual Visit No    Medication changes reported     No    Fall or balance concerns reported    No    Warm-up and Cool-down Performed on first and last piece of equipment    Resistance Training Performed Yes    VAD Patient? No    PAD/SET Patient? No      Pain Assessment   Currently in Pain? No/denies              Social History   Tobacco Use  Smoking Status Never Smoker  Smokeless Tobacco Former Systems developer  . Types: Chew    Goals Met:  Independence with exercise equipment Exercise tolerated well No report of cardiac concerns or symptoms Strength training completed today  Goals Unmet:  Not Applicable  Comments: Pt able to follow exercise prescription today without complaint.  Will continue to monitor for progression.    Dr. Emily Filbert is Medical Director for [facility].

## 2020-08-18 ENCOUNTER — Encounter: Payer: Medicare Other | Admitting: *Deleted

## 2020-08-18 ENCOUNTER — Other Ambulatory Visit: Payer: Self-pay

## 2020-08-18 DIAGNOSIS — J849 Interstitial pulmonary disease, unspecified: Secondary | ICD-10-CM

## 2020-08-18 NOTE — Progress Notes (Signed)
Daily Session Note  Patient Details  Name: Garrett Peterson MRN: [id number] Date of Birth: 1945-11-26 Referring Provider:   Flowsheet Row Pulmonary Rehab from 08/10/2020 in [facility] Cardiac and Pulmonary Rehab  Referring Provider Brand Males MD      Encounter Date: 08/18/2020  Check In:  Session Check In - 08/18/20 0811      Check-In   Supervising physician immediately available to respond to emergencies See telemetry face sheet for immediately available ER MD    Location [facility]-Cardiac & Pulmonary Rehab    Staff Present Heath Lark, RN, BSN, CCRP;Jessica [place], MA, RCEP, CCRP, CCET;Joseph [place] RCP,RRT,BSRT    Virtual Visit No    Medication changes reported     No    Fall or balance concerns reported    No    Warm-up and Cool-down Performed on first and last piece of equipment    Resistance Training Performed Yes    VAD Patient? No    PAD/SET Patient? No      Pain Assessment   Currently in Pain? No/denies              Social History   Tobacco Use  Smoking Status Never Smoker  Smokeless Tobacco Former Systems developer  . Types: Chew    Goals Met:  Proper associated with RPD/PD & O2 Sat Independence with exercise equipment Exercise tolerated well No report of cardiac concerns or symptoms  Goals Unmet:  Not Applicable  Comments: Pt able to follow exercise prescription today without complaint.  Will continue to monitor for progression.    Dr. Emily Filbert is Medical Director for [facility].

## 2020-08-18 NOTE — Telephone Encounter (Signed)
Spoke with patient regarding labs and Ofev. He has not yet started Ofev but will start taking. Discussed using Imodium if he needs. Discussed that if diarrhea is persistent that he should reach out to the clinic. Discussed that if diarrhea is persistent and refractory to loperamide, he can hold Ofev until it resolves. Then he can restart. Advised that he should reach out to the clinic with any concerns or serious side effects. Patient verbalized understanding. Will f/u in a month for lab reminder.  F/u with Dr. Chase Caller scheduled 08/31/20

## 2020-08-18 NOTE — Addendum Note (Signed)
Addended by: Cassandria Anger on: 08/18/2020 02:43 PM   Modules accepted: Orders

## 2020-08-18 NOTE — Telephone Encounter (Signed)
Patient CMP wnl. ALT borderline elevated but will monitor monthly for the first 6 months. Left VM with patient to communicate results and advise on Ofev. He received Ofev shipment last week  CMP Latest Ref Rng & Units 08/15/2020 06/14/2020 05/18/2020  Glucose 65 - 99 mg/dL 182(H) 279(H) 127(H)  BUN 8 - 27 mg/dL _0 Creatinine 0.76 - 1.27 mg/dL 0.97 1.20 1.08  Sodium 134 - 144 mmol/L 139 137 138  Potassium 3.5 - 5.2 mmol/L 3.9 5.2(H) 3.8  Chloride 96 - 106 mmol/L 99 101 101  CO2 20 - 29 mmol/L _1 Calcium 8.6 - 10.2 mg/dL 9.2 9.4 8.9  Total Protein 6.0 - 8.5 g/dL 6.4 7.5 6.9  Total Bilirubin 0.0 - 1.2 mg/dL 0.6 0.8 1.3(H)  Alkaline Phos 44 - 121 IU/L 96 118 89  AST 0 - 40 IU/L 27 55(H) 32  ALT 0 - 44 IU/L 45(H) 110(H) 48(H)   [name], PharmD, MPH Clinical Pharmacist (Rheumatology and Pulmonology)

## 2020-08-21 ENCOUNTER — Other Ambulatory Visit: Payer: Self-pay

## 2020-08-21 ENCOUNTER — Encounter: Payer: Medicare Other | Admitting: *Deleted

## 2020-08-21 DIAGNOSIS — J849 Interstitial pulmonary disease, unspecified: Secondary | ICD-10-CM | POA: Diagnosis not present

## 2020-08-21 NOTE — Progress Notes (Signed)
Daily Session Note  Patient Details  Name: Garrett Peterson MRN: [id number] Date of Birth: [birth year] Referring Provider:   April Manson Pulmonary Rehab from 08/10/2020 in [facility] Cardiac [facility] Pulmonary Rehab  Referring Provider Brand Males MD      Encounter Date: 08/21/2020  Check In:  Session Check In - 08/21/20 0812      Check-In   Supervising physician immediately available to respond to emergencies See telemetry face sheet for immediately available ER MD    Location [facility]-Cardiac & Pulmonary Rehab    Staff Present Earlean Shawl, BS, ACSM CEP, Exercise Physiologist;Joseph Flavia Shipper;Heath Lark, RN, BSN, CCRP    Virtual Visit No    Medication changes reported     No    Fall or balance concerns reported    No    Warm-up and Cool-down Performed on first and last piece of equipment    Resistance Training Performed Yes    VAD Patient? No    PAD/SET Patient? No      Pain Assessment   Currently in Pain? No/denies              Social History   Tobacco Use  Smoking Status Never Smoker  Smokeless Tobacco Former Systems developer  . Types: Chew    Goals Met:  Proper associated with RPD/PD & O2 Sat Independence with exercise equipment Exercise tolerated well No report of cardiac concerns or symptoms  Goals Unmet:  O2 Sat  Comments: Pt able to follow exercise prescription today without complaint.  Will continue to monitor for progression. Forgets to replace oxygen cannula after blowing nose.  Sats drop to low 80's    Dr. Emily Filbert is Medical Director for [facility].

## 2020-08-28 ENCOUNTER — Inpatient Hospital Stay: Payer: Medicare Other | Attending: Oncology

## 2020-08-28 DIAGNOSIS — Z85828 Personal history of other malignant neoplasm of skin: Secondary | ICD-10-CM | POA: Diagnosis not present

## 2020-08-28 DIAGNOSIS — Z95828 Presence of other vascular implants and grafts: Secondary | ICD-10-CM

## 2020-08-28 DIAGNOSIS — Z8616 Personal history of COVID-19: Secondary | ICD-10-CM | POA: Insufficient documentation

## 2020-08-28 DIAGNOSIS — R918 Other nonspecific abnormal finding of lung field: Secondary | ICD-10-CM | POA: Insufficient documentation

## 2020-08-28 DIAGNOSIS — Z452 Encounter for adjustment and management of vascular access device: Secondary | ICD-10-CM | POA: Diagnosis not present

## 2020-08-28 DIAGNOSIS — J9611 Chronic respiratory failure with hypoxia: Secondary | ICD-10-CM | POA: Diagnosis not present

## 2020-08-28 DIAGNOSIS — N281 Cyst of kidney, acquired: Secondary | ICD-10-CM | POA: Diagnosis not present

## 2020-08-28 DIAGNOSIS — C20 Malignant neoplasm of rectum: Secondary | ICD-10-CM | POA: Diagnosis present

## 2020-08-28 MED ORDER — SODIUM CHLORIDE 0.9% FLUSH
10.0000 mL | Freq: Once | INTRAVENOUS | Status: AC
  Administered 2020-08-28: 10 mL via INTRAVENOUS
  Filled 2020-08-28: qty 10

## 2020-08-28 MED ORDER — HEPARIN SOD (PORK) LOCK FLUSH 100 UNIT/ML IV SOLN
500.0000 [IU] | Freq: Once | INTRAVENOUS | Status: AC
  Administered 2020-08-28: 500 [IU] via INTRAVENOUS
  Filled 2020-08-28: qty 5

## 2020-08-28 MED ORDER — HEPARIN SOD (PORK) LOCK FLUSH 100 UNIT/ML IV SOLN
INTRAVENOUS | Status: AC
  Filled 2020-08-28: qty 5

## 2020-08-30 ENCOUNTER — Encounter: Payer: Self-pay | Admitting: *Deleted

## 2020-08-30 DIAGNOSIS — J849 Interstitial pulmonary disease, unspecified: Secondary | ICD-10-CM

## 2020-08-30 NOTE — Progress Notes (Signed)
Pulmonary Individual Treatment Plan  Patient Details  Name: Garrett Peterson MRN: [id number] Date of Birth: 19-Mar-1946 Referring Provider:   Flowsheet Row Pulmonary Rehab from 08/10/2020 in [facility] Cardiac and Pulmonary Rehab  Referring Provider Brand Males MD      Initial Encounter Date:  Flowsheet Row Pulmonary Rehab from 08/10/2020 in [facility] Cardiac and Pulmonary Rehab  Date 08/10/20      Visit Diagnosis: ILD (interstitial lung disease) ([place])  Patient's Home Medications on Admission:  Current Outpatient Medications:  .  albuterol (VENTOLIN HFA) 108 (90 Base) MCG/ACT inhaler, Inhale 2 puffs into the lungs every 6 (six) hours as needed for wheezing or shortness of breath. (Patient not taking: Reported on 08/02/2020), Disp: 1 each, Rfl: 0 .  atorvastatin (LIPITOR) 20 MG tablet, Take 20 mg by mouth daily., Disp: , Rfl:  .  capecitabine (XELODA) 500 MG tablet, Take 3 tablets (1,500 mg total) by mouth 2 (two) times daily after a meal. Take Monday through Friday during radiation. (Patient not taking: Reported on 08/02/2020), Disp: 126 tablet, Rfl: 0 .  dextromethorphan-guaiFENesin (MUCINEX DM) 30-600 MG 12hr tablet, Take 1 tablet by mouth 2 (two) times daily as needed for cough. (Patient not taking: Reported on 08/02/2020), Disp: 60 tablet, Rfl: 0 .  lidocaine-prilocaine (EMLA) cream, Apply to affected area once (Patient not taking: Reported on 08/02/2020), Disp: 30 g, Rfl: 3 .  metoprolol tartrate (LOPRESSOR) 25 MG tablet, Take 25 mg by mouth 2 (two) times daily., Disp: , Rfl:  .  Nintedanib (OFEV) 150 MG CAPS, Take 150 mg by mouth 2 (two) times daily., Disp: , Rfl:  .  predniSONE (STERAPRED UNI-PAK 21 TAB) 10 MG (21) TBPK tablet, Take by mouth daily. Take 44m for 2 weeks, 295mfor 2 weeks then 1050mor 2 weeks then 5mg46mily until further notice., Disp: 14 tablet, Rfl: 0 .  sertraline (ZOLOFT) 25 MG tablet, Take 1 tablet (25 mg total) by mouth daily. (Patient not taking: Reported on 08/02/2020),  Disp: 30 tablet, Rfl: 0 .  sulfamethoxazole-trimethoprim (BACTRIM) 400-80 MG tablet, Take 1 tablet by mouth every 12 (twelve) hours., Disp: 60 tablet, Rfl: 0 .  temazepam (RESTORIL) 30 MG capsule, Take 30 mg by mouth at bedtime. , Disp: , Rfl:   Past Medical History: Past Medical History:  Diagnosis Date  . Actinic keratosis   . Adenomatous polyp of colon   . Arthritis   . Basal cell carcinoma   . CAD (coronary artery disease)   . 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 Hyperlipidemia   . Hypertension   . Pneumonia   . Pre-diabetes   . Respiratory failure with hypoxia (HCC)     Tobacco Use: Social History   Tobacco Use  Smoking Status Never Smoker  Smokeless Tobacco Former UserSystems developerTypes: Chew    Labs: Recent Review Flowsheet Data   There is no flowsheet data to display.      Pulmonary Assessment Scores:  Pulmonary Assessment Scores    Row Name 08/10/20 1004         ADL UCSD   ADL Phase Entry     SOB Score total 66     Rest 0     Walk 1     Stairs 4     Bath 1     Dress 1     Shop 3           CAT Score   CAT Score 18  mMRC Score   mMRC Score 4            UCSD: Self-administered rating of dyspnea associated with activities of daily living (ADLs) 6-point scale (0 = "not at all" to 5 = "maximal or unable to do because of breathlessness")  Scoring Scores range from 0 to 120.  Minimally important difference is 5 units  CAT: CAT can identify the health impairment of COPD patients and is better correlated with disease progression.  CAT has a scoring range of zero to 40. The CAT score is classified into four groups of low (less than 10), medium (10 - 20), high (21-30) and very high (31-40) based on the impact level of disease on health status. A CAT score over 10 suggests significant symptoms.  A worsening CAT score could be explained by an exacerbation, poor medication adherence, poor inhaler technique, or progression of COPD or comorbid conditions.  CAT  MCID is 2 points  mMRC: mMRC (Modified Medical Research Council) Dyspnea Scale is used to assess the degree of baseline functional disability in patients of respiratory disease due to dyspnea. No minimal important difference is established. A decrease in score of 1 point or greater is considered a positive change.   Pulmonary Function Assessment:   Exercise Target Goals: Exercise Program Goal: Individual exercise prescription set using results from initial 6 min walk test and THRR while considering  patient's activity barriers and safety.   Exercise Prescription Goal: Initial exercise prescription builds to 30-45 minutes a day of aerobic activity, 2-3 days per week.  Home exercise guidelines will be given to patient during program as part of exercise prescription that the participant will acknowledge.  Education: Aerobic Exercise: - Group verbal and visual presentation on the components of exercise prescription. Introduces F.I.T.T principle from ACSM for exercise prescriptions.  Reviews F.I.T.T. principles of aerobic exercise including progression. Written material given at graduation. Flowsheet Row Pulmonary Rehab from 08/10/2020 in [facility] Cardiac and Pulmonary Rehab  Education need identified 08/10/20      Education: Resistance Exercise: - Group verbal and visual presentation on the components of exercise prescription. Introduces F.I.T.T principle from ACSM for exercise prescriptions  Reviews F.I.T.T. principles of resistance exercise including progression. Written material given at graduation.    Education: Exercise & Equipment Safety: - Individual verbal instruction and demonstration of equipment use and safety with use of the equipment. Flowsheet Row Pulmonary Rehab from 08/10/2020 in [facility] Cardiac and Pulmonary Rehab  Date 08/10/20  Educator [facility]  Instruction Review Code 1- Verbalizes Understanding      Education: Exercise Physiology & General Exercise Guidelines: - Group verbal  and written instruction with models to review the exercise physiology of the cardiovascular system and associated critical values. Provides general exercise guidelines with specific guidelines to those with heart or lung disease.    Education: Flexibility, Balance, Mind/Body Relaxation: - Group verbal and visual presentation with interactive activity on the components of exercise prescription. Introduces F.I.T.T principle from ACSM for exercise prescriptions. Reviews F.I.T.T. principles of flexibility and balance exercise training including progression. Also discusses the mind body connection.  Reviews various relaxation techniques to help reduce and manage stress (i.e. Deep breathing, progressive muscle relaxation, and visualization). Balance handout provided to take home. Written material given at graduation.   Activity Barriers & Risk Stratification:  Activity Barriers & Cardiac Risk Stratification - 08/10/20 0954      Activity Barriers & Cardiac Risk Stratification   Activity Barriers Deconditioning;Shortness of Breath;Muscular Weakness;Balance Concerns  6 Minute Walk:  6 Minute Walk    Row Name 08/10/20 0952         6 Minute Walk   Phase Initial     Distance 400 feet     Walk Time 3 minutes  stopped due to desaturation     # of Rest Breaks 0     MPH 1.52     METS 0.66     RPE 13     Perceived Dyspnea  3     VO2 Peak 2.29     Symptoms Yes (comment)     Comments SOB, chest tightness 4/10     Resting HR 57 bpm     Resting BP 132/64     Resting Oxygen Saturation  98 %     Exercise Oxygen Saturation  during 6 min walk 79 %     Max Ex. HR 89 bpm     Max Ex. BP 148/74     2 Minute Post BP 136/70           Interval HR   1 Minute HR 74     2 Minute HR 84     3 Minute HR 89     2 Minute Post HR 71     Interval Heart Rate? Yes           Interval Oxygen   Interval Oxygen? Yes     Baseline Oxygen Saturation % 98 %     1 Minute Oxygen Saturation % 89 %     1  Minute Liters of Oxygen 4 L  continuous     2 Minute Oxygen Saturation % 83 %     2 Minute Liters of Oxygen 4 L     3 Minute Oxygen Saturation % 79 %     3 Minute Liters of Oxygen 4 L     2 Minute Post Oxygen Saturation % 97 %     2 Minute Post Liters of Oxygen 4 L           Oxygen Initial Assessment:  Oxygen Initial Assessment - 08/10/20 1003      Home Oxygen   Home Oxygen Device E-Tanks    Sleep Oxygen Prescription Continuous    Liters per minute 4    Home Exercise Oxygen Prescription Continuous    Liters per minute 4    Home Resting Oxygen Prescription Continuous    Liters per minute 4    Compliance with Home Oxygen Use Yes      Initial 6 min Walk   Oxygen Used Continuous;E-Tanks    Liters per minute 4      Program Oxygen Prescription   Program Oxygen Prescription Continuous;E-Tanks    Liters per minute 4      Intervention   Short Term Goals To learn and exhibit compliance with exercise, home and travel O2 prescription;To learn and understand importance of monitoring SPO2 with pulse oximeter and demonstrate accurate use of the pulse oximeter.;To learn and understand importance of maintaining oxygen saturations>88%;To learn and demonstrate proper use of respiratory medications;To learn and demonstrate proper pursed lip breathing techniques or other breathing techniques.    Long  Term Goals Exhibits compliance with exercise, home and travel O2 prescription;Verbalizes importance of monitoring SPO2 with pulse oximeter and return demonstration;Maintenance of O2 saturations>88%;Exhibits proper breathing techniques, such as pursed lip breathing or other method taught during program session;Compliance with respiratory medication;Demonstrates proper use of MDI's  Oxygen Re-Evaluation:  Oxygen Re-Evaluation    Row Name 08/14/20 0812             Program Oxygen Prescription   Program Oxygen Prescription Continuous;E-Tanks       Liters per minute 4                Home Oxygen   Home Oxygen Device E-Tanks       Sleep Oxygen Prescription Continuous       Liters per minute 4       Home Exercise Oxygen Prescription Continuous       Liters per minute 4       Home Resting Oxygen Prescription Continuous       Liters per minute 4       Compliance with Home Oxygen Use Yes               Goals/Expected Outcomes   Short Term Goals To learn and demonstrate proper pursed lip breathing techniques or other breathing techniques.       Long  Term Goals Exhibits proper breathing techniques, such as pursed lip breathing or other method taught during program session       Comments Reviewed PLB technique with pt.  Talked about how it works and it's importance in maintaining their exercise saturations.       Goals/Expected Outcomes Short: Become more profiecient at using PLB.   Long: Become independent at using PLB.              Oxygen Discharge (Final Oxygen Re-Evaluation):  Oxygen Re-Evaluation - 08/14/20 0812      Program Oxygen Prescription   Program Oxygen Prescription Continuous;E-Tanks    Liters per minute 4      Home Oxygen   Home Oxygen Device E-Tanks    Sleep Oxygen Prescription Continuous    Liters per minute 4    Home Exercise Oxygen Prescription Continuous    Liters per minute 4    Home Resting Oxygen Prescription Continuous    Liters per minute 4    Compliance with Home Oxygen Use Yes      Goals/Expected Outcomes   Short Term Goals To learn and demonstrate proper pursed lip breathing techniques or other breathing techniques.    Long  Term Goals Exhibits proper breathing techniques, such as pursed lip breathing or other method taught during program session    Comments Reviewed PLB technique with pt.  Talked about how it works and it's importance in maintaining their exercise saturations.    Goals/Expected Outcomes Short: Become more profiecient at using PLB.   Long: Become independent at using PLB.           Initial Exercise  Prescription:  Initial Exercise Prescription - 08/10/20 0900      Date of Initial Exercise RX and Referring Provider   Date 08/10/20    Referring Provider Brand Males MD      Oxygen   Oxygen Continuous    Liters 4      Treadmill   MPH 1.5    Grade 0    Minutes 15   2 min walk 1 min rest   METs 2.15      Recumbant Bike   Level 1    RPM 50    Watts 10    Minutes 15    METs 2      NuStep   Level 1    SPM 80    Minutes 15    METs 2  Prescription Details   Frequency (times per week) 3    Duration Progress to 30 minutes of continuous aerobic without signs/symptoms of physical distress      Intensity   THRR 40-80% of Max Heartrate 92-127    Ratings of Perceived Exertion 11-13    Perceived Dyspnea 0-4      Progression   Progression Continue to progress workloads to maintain intensity without signs/symptoms of physical distress.      Resistance Training   Training Prescription Yes    Weight 3 lb    Reps 10-15           Perform Capillary Blood Glucose checks as needed.  Exercise Prescription Changes:  Exercise Prescription Changes    Row Name 08/10/20 0900 08/21/20 0800           Response to Exercise   Blood Pressure (Admit) 132/64 138/74      Blood Pressure (Exercise) 148/74 160/76      Blood Pressure (Exit) 124/72 122/60      Heart Rate (Admit) 57 bpm 59 bpm      Heart Rate (Exercise) 89 bpm 85 bpm      Heart Rate (Exit) 68 bpm 75 bpm      Oxygen Saturation (Admit) 98 % 95 %      Oxygen Saturation (Exercise) 79 % 86 %      Oxygen Saturation (Exit) 95 % 95 %      Rating of Perceived Exertion (Exercise) 13 15      Perceived Dyspnea (Exercise) 3 3      Symptoms SOB, chest tightness 4/10 SOB      Comments walk test results third day      Duration -- Progress to 30 minutes of  aerobic without signs/symptoms of physical distress      Intensity -- THRR unchanged             Progression   Progression -- Continue to progress workloads to  maintain intensity without signs/symptoms of physical distress.      Average METs -- 2.07             Resistance Training   Training Prescription -- Yes      Weight -- 3 lb      Reps -- 10-15             Oxygen   Oxygen -- Continuous      Liters -- 4             Treadmill   MPH -- 1.5      Grade -- 0      Minutes -- 15      METs -- 2.15             NuStep   Level -- 3      Minutes -- 15      METs -- 2             Exercise Comments:  Exercise Comments    Row Name 08/14/20 0810 08/21/20 0814         Exercise Comments First full day of exercise!  Patient was oriented to gym and equipment including functions, settings, policies, and procedures.  Patient's individual exercise prescription and treatment plan were reviewed.  All starting workloads were established based on the results of the 6 minute walk test done at initial orientation visit.  The plan for exercise progression was also introduced and progression will be customized based on patient's performance and goals. Forgets  to replace oxygen cannula after blowing nose.  Sats drop to low 80's             Exercise Goals and Review:  Exercise Goals    Row Name 08/10/20 1000             Exercise Goals   Increase Physical Activity Yes       Intervention Provide advice, education, support and counseling about physical activity/exercise needs.;Develop an individualized exercise prescription for aerobic and resistive training based on initial evaluation findings, risk stratification, comorbidities and participant's personal goals.       Expected Outcomes Short Term: Attend rehab on a regular basis to increase amount of physical activity.;Long Term: Add in home exercise to make exercise part of routine and to increase amount of physical activity.;Long Term: Exercising regularly at least 3-5 days a week.       Increase Strength and Stamina Yes       Intervention Develop an individualized exercise prescription for  aerobic and resistive training based on initial evaluation findings, risk stratification, comorbidities and participant's personal goals.;Provide advice, education, support and counseling about physical activity/exercise needs.       Expected Outcomes Short Term: Increase workloads from initial exercise prescription for resistance, speed, and METs.;Short Term: Perform resistance training exercises routinely during rehab and add in resistance training at home;Long Term: Improve cardiorespiratory fitness, muscular endurance and strength as measured by increased METs and functional capacity (6MWT)       Able to understand and use rate of perceived exertion (RPE) scale Yes       Intervention Provide education and explanation on how to use RPE scale       Expected Outcomes Short Term: Able to use RPE daily in rehab to express subjective intensity level;Long Term:  Able to use RPE to guide intensity level when exercising independently       Able to understand and use Dyspnea scale Yes       Intervention Provide education and explanation on how to use Dyspnea scale       Expected Outcomes Short Term: Able to use Dyspnea scale daily in rehab to express subjective sense of shortness of breath during exertion;Long Term: Able to use Dyspnea scale to guide intensity level when exercising independently       Knowledge and understanding of Target Heart Rate Range (THRR) Yes       Intervention Provide education and explanation of THRR including how the numbers were predicted and where they are located for reference       Expected Outcomes Short Term: Able to state/look up THRR;Short Term: Able to use daily as guideline for intensity in rehab;Long Term: Able to use THRR to govern intensity when exercising independently       Able to check pulse independently Yes       Intervention Provide education and demonstration on how to check pulse in carotid and radial arteries.;Review the importance of being able to check your  own pulse for safety during independent exercise       Expected Outcomes Short Term: Able to explain why pulse checking is important during independent exercise;Long Term: Able to check pulse independently and accurately       Understanding of Exercise Prescription Yes       Intervention Provide education, explanation, and written materials on patient's individual exercise prescription       Expected Outcomes Short Term: Able to explain program exercise prescription;Long Term: Able to explain home exercise prescription  to exercise independently              Exercise Goals Re-Evaluation :  Exercise Goals Re-Evaluation    Row Name 08/14/20 0810 08/21/20 0845           Exercise Goal Re-Evaluation   Exercise Goals Review Able to understand and use rate of perceived exertion (RPE) scale;Knowledge and understanding of Target Heart Rate Range (THRR);Understanding of Exercise Prescription;Able to understand and use Dyspnea scale Increase Physical Activity;Increase Strength and Stamina      Comments Reviewed RPE and dyspnea scales, THR and program prescription with pt today.  Pt voiced understanding and was given a copy of goals to take home. [place]xander has had some difficulty with exercise.  His oxygen has dropped to 86%.  Staff reviewed PLB and reminded him to keep cannula in place as it has not been at times during exercise.      Expected Outcomes Short: Use RPE daily to regulate intensity. Long: Follow program prescription in THR. Short: continue to work on PLB Long:  Build overall stamina             Discharge Exercise Prescription (Final Exercise Prescription Changes):  Exercise Prescription Changes - 08/21/20 0800      Response to Exercise   Blood Pressure (Admit) 138/74    Blood Pressure (Exercise) 160/76    Blood Pressure (Exit) 122/60    Heart Rate (Admit) 59 bpm    Heart Rate (Exercise) 85 bpm    Heart Rate (Exit) 75 bpm    Oxygen Saturation (Admit) 95 %    Oxygen Saturation  (Exercise) 86 %    Oxygen Saturation (Exit) 95 %    Rating of Perceived Exertion (Exercise) 15    Perceived Dyspnea (Exercise) 3    Symptoms SOB    Comments third day    Duration Progress to 30 minutes of  aerobic without signs/symptoms of physical distress    Intensity THRR unchanged      Progression   Progression Continue to progress workloads to maintain intensity without signs/symptoms of physical distress.    Average METs 2.07      Resistance Training   Training Prescription Yes    Weight 3 lb    Reps 10-15      Oxygen   Oxygen Continuous    Liters 4      Treadmill   MPH 1.5    Grade 0    Minutes 15    METs 2.15      NuStep   Level 3    Minutes 15    METs 2           Nutrition:  Target Goals: Understanding of nutrition guidelines, daily intake of sodium <1589m, cholesterol <2060m calories 30% from fat and 7% or less from saturated fats, daily to have 5 or more servings of fruits and vegetables.  Education: All About Nutrition: -Group instruction provided by verbal, written material, interactive activities, discussions, models, and posters to present general guidelines for heart healthy nutrition including fat, fiber, MyPlate, the role of sodium in heart healthy nutrition, utilization of the nutrition label, and utilization of this knowledge for meal planning. Follow up email sent as well. Written material given at graduation.   Biometrics:  Pre Biometrics - 08/10/20 1001      Pre Biometrics   Height 5' 8.9" (1.75 m)    Weight 228 lb (103.4 kg)    BMI (Calculated) 33.77    Single Leg Stand 5.4 seconds  Nutrition Therapy Plan and Nutrition Goals:  Nutrition Therapy & Goals - 08/16/20 1135      Nutrition Therapy   Diet Heart healthy, low Na    Drug/Food Interactions Statins/Certain Fruits    Protein (specify units) 80g    Fiber 30 grams    Whole Grain Foods 3 servings    Saturated Fats 12 max. grams    Fruits and Vegetables 8  servings/day    Sodium 1.5 grams      Personal Nutrition Goals   Nutrition Goal ST: try diabetes friendly desserts from handout, Add different nonstarchy vegetables to 5 or more meals per week LT:    Comments Breakfast: bacon, egg, and sausage sandwich or scrambled eggs or cornflakes (adds sugar and milk) Lunch: part of sandwich and crackers Dinner: meats with potatoes butter beans or corn, grilled cheese sandwich. Take out on the weekends. Snacks: sweets. Drinks: water and soda (zero) and tea (sweet tea - mcdonalds) and coffee (black). Reports T2DM (lifestyle controlled - not sure what A1C is and does not check BG at home), high cholesterol (lipitor has been helping). He had open heart surgery in the past. Discussed heart healthy eating and pulmonary MNT. [name] is interested in making changes.      Intervention Plan   Intervention Nutrition handout(s) given to patient.;Prescribe, educate and counsel regarding individualized specific dietary modifications aiming towards targeted core components such as weight, hypertension, lipid management, diabetes, heart failure and other comorbidities.    Expected Outcomes Short Term Goal: Understand basic principles of dietary content, such as calories, fat, sodium, cholesterol and nutrients.;Short Term Goal: A plan has been developed with personal nutrition goals set during dietitian appointment.;Long Term Goal: Adherence to prescribed nutrition plan.           Nutrition Assessments:  MEDIFICTS Score Key:  ?70 Need to make dietary changes   40-70 Heart Healthy Diet  ? 40 Therapeutic Level Cholesterol Diet  Flowsheet Row Pulmonary Rehab from 08/10/2020 in [facility] Cardiac and Pulmonary Rehab  Picture Your Plate Total Score on Admission 55     Picture Your Plate Scores:  <32 Unhealthy dietary pattern with much room for improvement.  41-50 Dietary pattern unlikely to meet recommendations for good health and room for improvement.  51-60 More healthful  dietary pattern, with some room for improvement.   >60 Healthy dietary pattern, although there may be some specific behaviors that could be improved.   Nutrition Goals Re-Evaluation:   Nutrition Goals Discharge (Final Nutrition Goals Re-Evaluation):   Psychosocial: Target Goals: Acknowledge presence or absence of significant depression and/or stress, maximize coping skills, provide positive support system. Participant is able to verbalize types and ability to use techniques and skills needed for reducing stress and depression.   Education: Stress, Anxiety, and Depression - Group verbal and visual presentation to define topics covered.  Reviews how body is impacted by stress, anxiety, and depression.  Also discusses healthy ways to reduce stress and to treat/manage anxiety and depression.  Written material given at graduation.   Education: Sleep Hygiene -Provides group verbal and written instruction about how sleep can affect your health.  Define sleep hygiene, discuss sleep cycles and impact of sleep habits. Review good sleep hygiene tips.    Initial Review & Psychosocial Screening:  Initial Psych Review & Screening - 08/04/20 1508      Initial Review   Current issues with Current Stress Concerns;Current Sleep Concerns    Source of Stress Concerns Unable to participate in former interests or  hobbies;Unable to perform yard/household activities;Chronic Illness      Family Dynamics   Good Support System? Yes   God, wife, medical team     Barriers   Psychosocial barriers to participate in program There are no identifiable barriers or psychosocial needs.      Screening Interventions   Interventions Provide feedback about the scores to participant;Encouraged to exercise;To provide support and resources with identified psychosocial needs    Expected Outcomes Short Term goal: Utilizing psychosocial counselor, staff and physician to assist with identification of specific Stressors or  current issues interfering with healing process. Setting desired goal for each stressor or current issue identified.;Long Term Goal: Stressors or current issues are controlled or eliminated.;Short Term goal: Identification and review with participant of any Quality of Life or Depression concerns found by scoring the questionnaire.;Long Term goal: The participant improves quality of Life and PHQ9 Scores as seen by post scores and/or verbalization of changes           Quality of Life Scores:  Scores of 19 and below usually indicate a poorer quality of life in these areas.  A difference of  2-3 points is a clinically meaningful difference.  A difference of 2-3 points in the total score of the Quality of Life Index has been associated with significant improvement in overall quality of life, self-image, physical symptoms, and general health in studies assessing change in quality of life.  PHQ-9: Recent Review Flowsheet Data    Depression screen [facility] 2/9 08/10/2020   Decreased Interest 0   Down, Depressed, Hopeless 0   PHQ - 2 Score 0   Altered sleeping 1   Tired, decreased energy 1   Change in appetite 0   Feeling bad or failure about yourself  0   Trouble concentrating 0   Moving slowly or fidgety/restless 0   Suicidal thoughts 0   PHQ-9 Score 2   Difficult doing work/chores Not difficult at all     Interpretation of Total Score  Total Score Depression Severity:  1-4 = Minimal depression, 5-9 = Mild depression, 10-14 = Moderate depression, 15-19 = Moderately severe depression, 20-27 = Severe depression   Psychosocial Evaluation and Intervention:  Psychosocial Evaluation - 08/04/20 1517      Psychosocial Evaluation & Interventions   Interventions Encouraged to exercise with the program and follow exercise prescription;Stress management education;Relaxation education    Comments Mr. Consuegra has had a complicated medical history this last year and a half. He was diagnosed with colon CA last  year and then randomnly started experiencing severe shortness of breaht this last fall. 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 He used to spend a lot of his time outdoors doing different activities, but now can't due to the oxygen tanks and shortness of breath. His sleep pattern has been unusual for the last few years with no recent change, where he sleeps only a little some nights and then up the rest of the night. 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 He received news recently that he was cancer free so now he is focused on his breathing issues. His main goal is to feel better and hopefully be able to wear a concentrator so he can do more of his hobbies outside.    Expected Outcomes Short; attend pulmonary rehab for education and exercise. Long: develop positive self care habits.    Continue Psychosocial Services  Follow up  required by staff           Psychosocial Re-Evaluation:   Psychosocial Discharge (Final Psychosocial Re-Evaluation):   Education: Education Goals: Education classes will be provided on a weekly basis, covering required topics. Participant will state understanding/return demonstration of topics presented.  Learning Barriers/Preferences:  Learning Barriers/Preferences - 08/04/20 1508      Learning Barriers/Preferences   Learning Barriers None    Learning Preferences None           General Pulmonary Education Topics:  Infection Prevention: - Provides verbal and written material to individual with discussion of infection control including proper hand washing and proper equipment cleaning during exercise session. Flowsheet Row Pulmonary Rehab from 08/10/2020 in [facility] Cardiac and Pulmonary Rehab  Date 08/10/20  Educator [facility]  Instruction Review Code 1- Verbalizes Understanding      Falls Prevention: - Provides verbal and written material to individual with discussion of falls prevention and  safety. Flowsheet Row Pulmonary Rehab from 08/10/2020 in [facility] Cardiac and Pulmonary Rehab  Date 08/10/20  Educator [facility]  Instruction Review Code 1- Verbalizes Understanding      Chronic Lung Disease Review: - Group verbal instruction with posters, models, PowerPoint presentations and videos,  to review new updates, new respiratory medications, new advancements in procedures and treatments. Providing information on websites and "800" numbers for continued self-education. Includes information about supplement oxygen, available portable oxygen systems, continuous and intermittent flow rates, oxygen safety, concentrators, and Medicare reimbursement for oxygen. Explanation of Pulmonary Drugs, including class, frequency, complications, importance of spacers, rinsing mouth after steroid MDI's, and proper cleaning methods for nebulizers. Review of basic lung anatomy and physiology related to function, structure, and complications of lung disease. Review of risk factors. Discussion about methods for diagnosing sleep apnea and types of masks and machines for OSA. Includes a review of the use of types of environmental controls: home humidity, furnaces, filters, dust mite/pet prevention, HEPA vacuums. Discussion about weather changes, air quality and the benefits of nasal washing. Instruction on Warning signs, infection symptoms, calling MD promptly, preventive modes, and value of vaccinations. Review of effective airway clearance, coughing and/or vibration techniques. Emphasizing that all should Create an Action Plan. Written material given at graduation. Flowsheet Row Pulmonary Rehab from 08/10/2020 in [facility] Cardiac and Pulmonary Rehab  Education need identified 08/10/20      AED/CPR: - Group verbal and written instruction with the use of models to demonstrate the basic use of the AED with the basic ABC's of resuscitation.    Anatomy and Cardiac Procedures: - Group verbal and visual presentation and models  provide information about basic cardiac anatomy and function. Reviews the testing methods done to diagnose heart disease and the outcomes of the test results. Describes the treatment choices: Medical Management, Angioplasty, or Coronary Bypass Surgery for treating various heart conditions including Myocardial Infarction, Angina, Valve Disease, and Cardiac Arrhythmias.  Written material given at graduation.   Medication Safety: - Group verbal and visual instruction to review commonly prescribed medications for heart and lung disease. Reviews the medication, class of the drug, and side effects. Includes the steps to properly store meds and maintain the prescription regimen.  Written material given at graduation.   Other: -Provides group and verbal instruction on various topics (see comments)   Knowledge Questionnaire Score:  Knowledge Questionnaire Score - 08/10/20 1002      Knowledge Questionnaire Score   Pre Score 13/15 Education Focus: O2 safety, exercise  Core Components/Risk Factors/Patient Goals at Admission:  Personal Goals and Risk Factors at Admission - 08/10/20 1002      Core Components/Risk Factors/Patient Goals on Admission    Weight Management Yes;Obesity;Weight Loss    Intervention Weight Management: Develop a combined nutrition and exercise program designed to reach desired caloric intake, while maintaining appropriate intake of nutrient and fiber, sodium and fats, and appropriate energy expenditure required for the weight goal.;Weight Management: Provide education and appropriate resources to help participant work on and attain dietary goals.;Weight Management/Obesity: Establish reasonable short term and long term weight goals.;Obesity: Provide education and appropriate resources to help participant work on and attain dietary goals.    Admit Weight 228 lb (103.4 kg)    Goal Weight: Short Term 220 lb (99.8 kg)    Goal Weight: Long Term 215 lb (97.5 kg)    Expected  Outcomes Short Term: Continue to assess and modify interventions until short term weight is achieved;Long Term: Adherence to nutrition and physical activity/exercise program aimed toward attainment of established weight goal;Weight Loss: Understanding of general recommendations for a balanced deficit meal plan, which promotes 1-2 lb weight loss per week and includes a negative energy balance of [phone] kcal/d;Understanding recommendations for meals to include 15-35% energy as protein, 25-35% energy from fat, 35-60% energy from carbohydrates, less than 256m of dietary cholesterol, 20-35 gm of total fiber daily;Understanding of distribution of calorie intake throughout the day with the consumption of 4-5 meals/snacks    Improve shortness of breath with ADL's Yes    Intervention Provide education, individualized exercise plan and daily activity instruction to help decrease symptoms of SOB with activities of daily living.    Expected Outcomes Short Term: Improve cardiorespiratory fitness to achieve a reduction of symptoms when performing ADLs;Long Term: Be able to perform more ADLs without symptoms or delay the onset of symptoms    Hypertension Yes    Intervention Provide education on lifestyle modifcations including regular physical activity/exercise, weight management, moderate sodium restriction and increased consumption of fresh fruit, vegetables, and low fat dairy, alcohol moderation, and smoking cessation.;Monitor prescription use compliance.    Expected Outcomes Short Term: Continued assessment and intervention until BP is < 140/931mHG in hypertensive participants. < 130/8032mG in hypertensive participants with diabetes, heart failure or chronic kidney disease.;Long Term: Maintenance of blood pressure at goal levels.    Lipids Yes    Intervention Provide education and support for participant on nutrition & aerobic/resistive exercise along with prescribed medications to achieve LDL <3m46mDL >40mg23m  Expected Outcomes Short Term: Participant states understanding of desired cholesterol values and is compliant with medications prescribed. Participant is following exercise prescription and nutrition guidelines.;Long Term: Cholesterol controlled with medications as prescribed, with individualized exercise RX and with personalized nutrition plan. Value goals: LDL < 3mg,37m > 40 mg.           Education:Diabetes - Individual verbal and written instruction to review signs/symptoms of diabetes, desired ranges of glucose level fasting, after meals and with exercise. Acknowledge that pre and post exercise glucose checks will be done for 3 sessions at entry of program.   Know Your Numbers and Heart Failure: - Group verbal and visual instruction to discuss disease risk factors for cardiac and pulmonary disease and treatment options.  Reviews associated critical values for Overweight/Obesity, Hypertension, Cholesterol, and Diabetes.  Discusses basics of heart failure: signs/symptoms and treatments.  Introduces Heart Failure Zone chart for action plan for heart failure.  Written material given  at graduation.   Core Components/Risk Factors/Patient Goals Review:    Core Components/Risk Factors/Patient Goals at Discharge (Final Review):    ITP Comments:  ITP Comments    Row Name 08/04/20 1504 08/10/20 0950 08/14/20 0810 08/16/20 1141 08/30/20 0949   ITP Comments Initial telephone orientation completed. Diagnosis can be found in [facility] 3/23. EP orientation scheduled for Thursday 3/31 at 8am. Completed 6MWT and gym orientation. Initial ITP created and sent for review to Dr. Emily Filbert, Medical Director. First full day of exercise!  Patient was oriented to gym and equipment including functions, settings, policies, and procedures.  Patient's individual exercise prescription and treatment plan were reviewed.  All starting workloads were established based on the results of the 6 minute walk test done at  initial orientation visit.  The plan for exercise progression was also introduced and progression will be customized based on patient's performance and goals. Completed initial RD evaluation 30 Day review completed. Medical Director ITP review done, changes made as directed, and signed approval by Medical Director.          Comments:

## 2020-08-31 ENCOUNTER — Other Ambulatory Visit: Payer: Self-pay

## 2020-08-31 ENCOUNTER — Telehealth: Payer: Self-pay

## 2020-08-31 ENCOUNTER — Ambulatory Visit (INDEPENDENT_AMBULATORY_CARE_PROVIDER_SITE_OTHER): Payer: Medicare Other | Admitting: Internal Medicine

## 2020-08-31 ENCOUNTER — Encounter: Payer: Self-pay | Admitting: Internal Medicine

## 2020-08-31 ENCOUNTER — Other Ambulatory Visit: Payer: Self-pay | Admitting: Internal Medicine

## 2020-08-31 VITALS — BP 128/72 | HR 81 | Temp 97.8°F | Ht 69.0 in | Wt 223.0 lb

## 2020-08-31 DIAGNOSIS — J84112 Idiopathic pulmonary fibrosis: Secondary | ICD-10-CM | POA: Diagnosis not present

## 2020-08-31 DIAGNOSIS — Z5181 Encounter for therapeutic drug level monitoring: Secondary | ICD-10-CM

## 2020-08-31 DIAGNOSIS — R5381 Other malaise: Secondary | ICD-10-CM | POA: Diagnosis not present

## 2020-08-31 DIAGNOSIS — J209 Acute bronchitis, unspecified: Secondary | ICD-10-CM

## 2020-08-31 DIAGNOSIS — I25119 Atherosclerotic heart disease of native coronary artery with unspecified angina pectoris: Secondary | ICD-10-CM

## 2020-08-31 DIAGNOSIS — J9611 Chronic respiratory failure with hypoxia: Secondary | ICD-10-CM

## 2020-08-31 NOTE — Telephone Encounter (Signed)
Dr. Garen Lah received a secure chat message from Dr. Chase Caller requesting that the patient be seen for right heart cath consideration.  Per Dr. Garen Lah please call this patient to schedule a new patient appt with Dr. Saunders Revel or Dr. Fletcher Anon.

## 2020-08-31 NOTE — Telephone Encounter (Signed)
Left message for patient to schedule New Patient appointment with DR. Fletcher Anon or Dr. Saunders Revel

## 2020-08-31 NOTE — Patient Instructions (Addendum)
ICD-10-CM   1. Chronic hypoxemic respiratory failure (HCC)  J96.11   2. 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 Physical deconditioning  R53.81   4. Medication monitoring encounter  Z51.81   5. Acute bronchitis, unspecified organism  J20.9    Glad overall stable with IPF Glad you are enjoying pulmonary rehabilitation and managing that with 4 L nasal cannula Noted that you have handle reducing to 5 mg prednisone per day well Noted that you have not yet started nintedanib [although you have it at home]  -Noted that you have a history of irritable bowel syndrome  Noted that you developed acute bronchitis August 23, 2020 and are on a 10-day Bactrim course Noted that you are getting better from this  You do need right heart catheterization  Plan - Complete Bactrim in the next few days [acute bronchitis] -Continue oxygen 4 L -Okay to restart pulm rehabilitation -Start nintedanib with 150 mg once daily for 1 week and then escalate to 150 mg twice daily  -Once he started nintedanib we will get a better baseline idea about irritable bowel and will make determinations on how to address that -Refer Dr Kate Sable [facility] cardiology in [place] if for right heart catheterization  -If he cannot do it, then see Dr Adrian Prows in [place]  -Any elevation pulmonary artery pressure will qualify you for treprostinil  Follow-up - Telephone visit with Dr. Chase Caller or face-to-face visit with nurse practitioner in [place] in 3 weeks - > to ensure good tolerance to ofev

## 2020-08-31 NOTE — Progress Notes (Signed)
Hospital admission 03/03/2020-03/04/2020  -  at [facility] for acute hypoxemic respiratory failure Garrett Peterson a 75 y.o.malewith medical history significant ofhypertension, hyperlipidemia, prediabetes, CAD, rectal cancer(s/p resection with primaryre-anastomosis by Dr.Cintronand patient is currently on chemotherapy via right chest port),presented to the hospitalwith shortness of breath.Patient stated thathehad positive COVID-19 test 3 weeks ago (patient and wife deny this to be true on pccm visit 07/04/20), and finished 10-day course oftreatment.Repeat testing was negative. Patient initially felt better but subsequently has started feeling short of breath with dyspnea as well. He has been fully vaccinated for COVID-19 including booster shot. Patient hadoxygen desaturation to79% on ambulation, but improved to 97% on resting.In the ED, patient was found to have a negative Covid PCR. Electrolytes are okay. WBC at 10.0. Chest x-ray showed bilateral patchy infiltrates.  Patient was then considered for observation in the hospital.  Acute hypoxic respiratory failure secondary to recent COVID-19 virus: Hypoxia mostly exertional but at rest saturating very well. Patient appears to be completely comfortable at this time.  Not even on supplemental oxygen.  No increased work of breathing.  Chest appears to be clear.  Chest x-ray with infiltrates which is expected at this time..   Patient will be given prednisone p.o., Robitussin-DM, and will be considered for discharge home today.Flu test was negative as well. Troponin was negative.   Patient was advised against overexertion.    03/20/2020: Office visit with oncology at [facility] dr Tasia Catchings  #Rectal cancer stage III Patient has completed 7 cycles of adjuvant FOLFOX. Repeat CT abdomen pelvis showed no recurrence disease.  CEA has trended up, which can be secondary to lung infection. Hold FOLFOX treatment today. Patient has  been started on radiation treatments.  I discussed with rad Onc Dr. Baruch Gouty who recommends to continue radiation with no interruption at this point.  In that case, I will add low-dose Xeloda 1500 mg twice daily to be used with radiation.  I called patient and discussed with him and he is in agreement.  If he is not able to tolerate, will does reduce or discontinue.  We will look up his coverage of Xeloda I explained to the patient the risks and benefits of chemotherapy including all but not limited to, hair loss, hearing loss, mouth sore, nausea, vomiting, low blood counts, bleeding, heart failure, kidney failure, hand-foot syndrome and risk of life threatening infection and even death, secondary malignancy etc.   Patient voices understanding and willing to proceed chemotherapy.   # Chemotherapy education; port placement. Hopefully the planned start chemotherapy next week. Antiemetics-Zofran and Compazine; EMLA cream sent to pharmacy   #Chronic respiratory failure, hypoxia on home oxygen 4 to 5 L with activity. Patient follows up with Dr. Raul Del and will have a repeat x-ray. I encourage patient to further discuss with pulmonology about the prognosis, anticipated duration of steroid.    #Renal cyst, plan MRI abdomen with and without contrast for further evaluation of the renal cyst.  In the future #Weight loss, weight has been stable. #Port-A-Cath in place, continue port flush every 8 weeks.    03/21/2020: Office visit Dr. Herbert Seta clinic [place]  Here with worsening sob and ground glass infiltrates (extensive). Preceding scan of ILD. ? Developing ipf flare, ? covid lung ( test have all been negative),? pneumonia on the right, doubt chf. W/u in progress.  -oxygen at 4-5 liters with activity. -increase prednisone to 30 mg q day -trial levaquin 500 mg q day  -per above ? Bronchoscopy -follow up  in one week -BNP -f/u in 10 days       ANA Direct - [facility] Negative Negative    RA Latex Turbid. - [facility] 0.0 - 13.9 IU/mL <10.0   A.Fumigatus #1 Abs - [facility] Negative Negative  Micropolyspora faeni, IgG - [facility] Negative Negative  Thermoactinomyces vulgaris, IgG - [facility] Negative Negative  A. Pullulans Abs - [facility] Negative Negative  Thermoact. Saccharii - [facility] Negative Negative  Pigeon Serum Abs - [facility] Negative Negative   Angio Convert Enzyme - [facility] 14 - 82 U/L 26   C Reactive Protein - [facility] 0 - 10 mg/L 5   IMPRESSION:  Extensive ground-glass airspace opacities throughout the lungs,  worsening since prior study, most pronounced in the mid and lower  lung zones. This is most concerning for infectious or inflammatory  process. COVID pneumonia could have this appearance or inflammatory  fibrosis following COVID pneumonia. Other inflammatory infectious  processes are also possible.   Trace left effusion.   Aortic atherosclerosis.       04/10/2020: Office visit at [facility] clinic in [place] by Dr. Raul Del pulmonary  Here with worsening sob and ground glass infiltrates (extensive). Preceding scan of ILD. ? Developing ipf flare, ? covid lung ( test have all been negative),?pneumonia on the right, doubtchf. Has had his covid booster shor. W/u in progress. Sob is getting worse -oxygen at4-5 liters with activity. -prednisone to 30 mg q day -Bronchoscopy vs OLB ( will discuss with the team)  -follow up in one week --f/u in 14 ayswith cxr.  Consult on May 11, 2020 with Dr Lanney Gins at [place] [facility] in [facility]  Garrett Peterson is 75 y.o. male who was referred to [facility] due to   Patient did have airway inspection with bronchoscopy as well as bronchoalveolar lavage this was negative for PCP via PCR, and aerobic cultures were done which were negative as well as aerobic cultures were done on BAL which is also negative, Aspergillus testing was done on bronchoalveolar fluid which was also negative and  acid-fast smear and culture was done which are both negative. Cytology was also collected and was negative for atypia.  He has been on prednisone for appx 2 months now - 30m daily , we will step down to 250m   He does cough with phlegm production, we discussed bringing in additional resp culture.   DIAGNOSIS:  A. RIGHT LUNG; LAVAGE: [facility] on [facility], [facility].  - REACTIVE BRONCHIAL EPITHELIUM AND ACUTE INFLAMMATION.   GROSS DESCRIPTION:  A. Site: Right lung  Procedure: Bronchoscopy  Cytology specimen: Lavage  Cytotechnologist(s): JaMolli BarrowsAshlee Howze-Soremekun  Specimen material collected and submitted for:  0 diff Quik stained slides  0 Pap stained slides  Specimen material submitted for: Cell block and ThinPrep   The cell block material is fixed in formalin for 6 hours   cycle is every 14 days: Oxaliplatin  Leucovorin  Fluorouracil  Fluorouracil   **Always confirm dose/schedule in your pharmacy ordering system**  REASON: Continuation Of Treatment PRIOR TREATMENT: ROS56: mFOLFOX6 q14 Days x 4 Months TREATMENT RESPONSE: Stable Disease (SD)  START ON PATHWAY REGIMEN - Colorectal   Administer Monday through Friday: Capecitabine   **Always confirm dose/schedule in your pharmacy ordering system**  Patient Characteristics: Postoperative without Neoadjuvant Therapy (Pathologic Staging), Rectal, pT4, pN0 or Any pT, pN+ Tumor Location: Rectal Therapeutic Status: Postoperative without Neoadjuvant Therapy (Pathologic Staging) AJCC M Category: cM0 AJCC T Category: pT3  AJCC N Category: pN1 AJCC 8 Stage Grouping: IIIB Intent of Therapy: Curative Intent, Discussed with Patient  As per up-to-date dyspnea occurs in 13% of patients on Oxaliplatin and numerous reports of pulmonary interstitial fibrosis.+-  Past medical History: He has a past medical history of COVID-19, History of pneumonia, adenomatous colonic  polyps (07/15/2018), Hyperlipidemia (07/15/2018), Hypertension, and Respiratory failure with hypoxia (CMS-HCC).  has No Known Allergies.   Plan  Acute hypoxemic respiratory failure - patient has been on FOLFOX - there is reported dyspnea (13%), and reports of interstitial infiltrates and fibrosis. This is rare but with negative infectious workup we should consider inflammatory component.  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 Bronchoscopy was performed during hospital admission which showed cell count from BAL with mixed picture- 88 cells total, 4% eos, 35% neutrophil, 32% monocyte-macrophage, 29% lymphs. Culture showed NGTD. TTE results did not show any evidence of shunt that may be contributing to hypoxia. Pulmonology recommended high dose steroids, 60 mg prednisone for one month and then to taper. Continued bactrim 400-72m BID. Plan to follow up with pulmonology and ILD clinic.   1.  Chronic hypoxic respiratory failure- follow up with pulmonology regarding initiation of high dose prednisone, 60 mg for one month followed with taper.    Echo 05/18/20 . Image acquisition challenging due to patient body habitus.  Delayed to access port that had clotted off.  IMPRESSIONS    1. Negative bubble study.  2. Left ventricular ejection fraction, by estimation, is 65 to 70%. The  left ventricle has normal function. The left ventricle has no regional  wall motion abnormalities. There is moderate concentric left ventricular  hypertrophy. Left ventricular  diastolic parameters  are consistent with Grade I diastolic dysfunction  (impaired relaxation).  3. Right ventricular systolic function is mildly reduced. The right  ventricular size is mildly enlarged. There is normal pulmonary artery  systolic pressure.  4. The mitral valve is normal in structure. Mild mitral valve  regurgitation. No evidence of mitral stenosis.  5. The aortic valve is normal in structure. Aortic valve regurgitation is  not visualized. No aortic stenosis is present.  6. The inferior vena cava is normal in size with greater than 50%  respiratory variability, suggesting right atrial pressure of 3 mmHg.   ov 06/06/20  Chief Complaint  Patient presents with  . Hospitalization Follow-up    Here for HFU--1/4-1/7 hospital stay with bronch and ct done.  SHOB is about the same.       Reason for Visit: Hospital follow-up   Mr. 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  Pulmonary was consulted during his admission from 1/4-1/7. He had previously underwent bronchoscopy on 03/14/20 [place] results were negative for cultures and cytology. Prior to admission he was on prednisone for 2 months and started on bactrim the week prior. His CT demonstrated progressive GGO  so he was advised for admission. When seen at [facility], he underwent second bronchoscopy with BAL on 05/18/20 which demonstrated mixed cellularity, neg cultures, neg cytology. His steroids were increased to 60 mg daily with plan for slow taper.  He reports since discharge that his breathing is unchanged despite steroids and feels his quality of life is poor. He ambulates within the house and motivates himself to stay active but it is not at the level he was prior. Dyspnea worsens with exertion and improves with rest. Denies cough and  wheezing. He is completed 7 out of 8 of chemo treatments and not planned for any further infusions due to his respiratory issues.  Social History: Never smoker  PLAN --Recommend continuing to hold chemotherapy --Decrease prednisone to 30 mg daily x 2 week, followed by 20 mg until seen in clinic --Order CT Chest without contrast on 06/26/20 --Follow-up scheduled with Dr. Chase Caller on 07/04/20    OV 07/04/2020  Subjective:  Patient ID: Garrett Peterson, male , DOB: 1945/06/19 , age 80 y.o. , MRN: [id number] , ADDRESS: [place] [address] PCP Albina Billet, MD Patient Care Team: Albina Billet, MD as PCP - General (Internal Medicine) Clent Jacks, RN as Oncology Nurse Navigator Earlie Server, MD as Consulting Physician (Oncology)  This Provider for this visit: Treatment Team:  Attending Provider: Brand Males, MD    07/04/2020 -referred to the ILD center for comprehensive evaluation by Dr. Rodman Pickle primary pulmonologist Chief Complaint  Patient presents with  . Follow-up    Chest congestion     HPI Garrett Peterson 75 y.o. -very complicated story.  Details of the story is copied and pasted above.  Meeting him for the first time with his wife.  It appears that he was previously healthy as of April 2021.  Although CT scan of the chest at the time of my personal visualization showed presence of early ILD [CT abdomen 10 years prior in 2011 had no ILD].  He was diagnosed with anorectal cancer.  He was undergoing treatment at [facility].  Chemotherapy as above.  Then in the fall/winter 2020 when he started falling ill with respiratory illnesses.  This is all documented above.  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  CT scan of the chest by October 2021 started showing significant amount of groundglass opacities.  He only had ANA test, rheumatoid factor and angiotensin-converting enzyme.  This is all negative.  For serology.  Otherwise has not had any other  serology.  He had hypersensitive pneumonitis panel this was negative.  This was at outside facility [facility] clinic pulmonology program.  He has had 2 hospitalizations the most recent one being in January.  It appears that he was treated with prednisone each time.  It is unclear to me as this to him and his wife if he has been on chronic prednisone but reading the notes it appears so.  Currently is just finished a taper starting at 60 mg/day in January and finished at 20 mg and stop.  He says since stopping yesterday or today he is beginning to feel a little bit worse.  Room air oxygen at rest was fine but when he desaturated after he walked more than 2 laps.  However he needed 5 L to correct.  Most recent echocardiogram was normal.  Most recent lavage showed mostly neutrophils.  Again culture negative.  His most recent CT scan of the chest in February 2022 without contrast shows  improvement in groundglass opacities but no fibrosis.  His serologies ANA negative and rheumatoid factor negative and angiotensin converting enzyme negative  Definitely earlier on in the year it was not consistent with UIP and suggested alternate diagnosis.  Unclear to me if he has probable UIP pattern at this point in time in February 2022.  We did not have time to do an extensive detailed ILD questionnaire.  It appears that his goals are to get better and also feel better at the same time control his cancer.  They are also wondering about prognosis.  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  He is currently looking into getting radiation locally.  Oncologist wants to do a colonoscopy.  She feels the recurrence is because all his chemo has been on hold because of respiratory issues.  SYMPTOM SCALE - ILD 07/04/2020   O2 use 4L   Shortness of Breath 0 -> 5 scale with 5  being worst (score 6 If unable to do)  At rest 1  Simple tasks - showers, clothes change, eating, shaving 4  Household (dishes, doing bed, laundry) x  Shopping x  Walking level at own pace 3  Walking up Stairs 4  Total (30-36) Dyspnea Score 12  How bad is your cough? 0  How bad is your fatigue 4  How bad is nausea 0  How bad is vomiting?  0  How bad is diarrhea? 0  How bad is anxiety? 5  How bad is depression 5    [facility]rology Dec 2021 - [name]  ANA Direct - [facility] Negative Negative   RA Latex Turbid. - [facility] 0.0 - 13.9 IU/mL <10.0   A.Fumigatus #1 Abs - [facility] Negative Negative  Micropolyspora faeni, IgG - [facility] Negative Negative  Thermoactinomyces vulgaris, IgG - [facility] Negative Negative  A. Pullulans Abs - [facility] Negative Negative  Thermoact. Saccharii - [facility] Negative Negative  Pigeon Serum Abs - [facility] Negative Negative   Angio Convert Enzyme - [facility] 14 - 82 U/L 26   Walk tst 07/04/2020    94% - ra at rest -> walked 2 laps without desat and dropped to 86% in middle of 3rd lap. Then need 5L [place] to correct to walk 3 laps in office   CT chest 06/23/20   IMPRESSION: 1. 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 Fibrosis has progressed since 03/02/2020 chest CT with resolved consolidative opacities. 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 UIP is difficult to exclude but is less favored. Follow-up high-resolution chest CT suggested in 6-12 months. Findings are indeterminate for UIP per consensus guidelines: Diagnosis of Idiopathic Pulmonary Fibrosis: An Official ATS/ERS/JRS/ALAT Clinical Practice Guideline. [place], Iss 5,  [id number], Jan 11 2017. 2. Stable mild cardiomegaly. 3. Aortic Atherosclerosis (ICD10-I70.0).   Electronically Signed   By: [name] M.D.   On: 06/24/2020 15:15       CT Abd 06/30/20   IMPRESSION: 1. Surgical sutures at the rectosigmoid junction. Development of irregular rectal wall thickening, suspicious for locally recurrent disease. 2. No findings of metastatic disease within the chest, abdomen, or pelvis. 3.  Interstitial lung disease, as on 06/24/2020 high-resolution chest CT. Likely post infectious/inflammatory fibrosis. 4. Lower pole left renal lesion is technically indeterminate but favored to represent a complex cyst. 5.  Aortic and branch vessel atherosclerosis.   Electronically Signed   By: Abigail Miyamoto M.D.   On: 06/30/2020 11:00    xxxxxxxxxxxxxxxxxxxxxxxxxxxxxxxxxxxxxxxxxxxxxxxxxxxxxxxxxxxxxxxxxxxxxxxxxxxxxxxxxxxxxxxxxxxxxxxxxxxxxxxx   Interstitial Lung Disease Multidisciplinary Conference   Garrett Peterson    MRN [id number]    DOB 18-Feb-1946  Primary Care Physician:Tate, Leona Carry, MD  Referring Physician: Ann Lions  Time of Conference: 7.30am- 8.30am Date of conference: 07/18/20 Location of Conference: -  Virtual  Participating Pulmonary: Dr. Brand Males, MD - yes,  Dr Marshell Garfinkel, MD - yes Pathology: Dr Jaquita Folds, MD - yes , Dr Enid Cutter - n Radiology: Dr Salvatore Marvel MD - no, Dr Vinnie Langton MD - yes,  Dr Lorin Picket, MD - no , Dr Eddie Candle - no Others: Alethia Berthold RN, Madie Reno, Lenice Llamas  Brief History:  He got chemo folfox in summer/fall 2021 and got ILD and on/off steroids. 2 admits. Most recent Jan 2022 - > 80% polys on BAL. TTbx end of 2021 at [facility] non-diagnostic.  Given dx of drug induced pneumonitis. However, CT April 2021 even before chemo had ILD (abasent in 2011). So, is this UIP with flare up from chemo? Clinically white male , > 65, negatie serology - so I am thinking IPF.  What is the pattern on  CT? xxxxxxx clinical dx  - steroid role consdier anti fib  Serology: negative  MDD discussion of CT scan    - Date or time period of scan :2011 - fine 2021 April - > Had ILD pre chemo. ST+Periph GGAT +, some bronchiectassis +, peripheral bronchielectasis, CCG + -> PROB UIP 2021 Oct (in chemo) -> dramatic different CT, more cylindrical bronhictrasis, has dramatic bronchiectasis, GGO, very very dense consolidation, CCG + - WRSRS - PROB UIP with FLARE 2022 feb - similar except conslidative areas are more GGO- PROB UIP  clinically he is becoming more fibrotic per rad  Final RAd DX - PROB UIP with slare and worsening  Pathology discussion of biopsy no path but has BAL:  PFTs: No flowsheet data found.    Labs: *Results for Garrett Peterson, Garrett Peterson (MRN [id number]) as of 07/20/2020 18:07  Ref. 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 Cnsider antifibrotics   Time Spent in preparation and discussion:  > 30 min xxxxxxxxxxxxxxxxxxxxxxxxxxxxxxxxxxxxxxxxxxxxxxxxxxxxxxxxxxxxxxxxxxxxxxxxxxxxxxxxxxxxxxxxxxxxxxxxxxxxxxxxxxxxxxxxxxxxxxxxxxxxxxxxxxxxx OV 08/02/20 08/02/2020 Patient presents today for 1 month follow-up with spirometry/DLCO. Patient was discussed at ILD conference in March 2022, diagnosed with IPF. Recent exacerbation likely chemo related. Started on Ofev. Patient was referred for right heart cath and referred to pulmonary rehab. He is currently on an extended prednisone taper. He started 59m prednisone dose today x 2 weeks. He is still taking Bactrim. Breathing has improved. He has not started  antifibrotic medication yet, his wifes states that their insurance has improved OFEV medication.   PFT 08/02/2020 - FVC 1.61 (41%), FEV1 1.55 (55%), ratio 96, DLCOcor  12.27 (52%)   OV 08/31/2020  Subjective:  Patient ID: Garrett Peterson, male , DOB: Mar 10, 1946 , age 73 y.o. , MRN: [id number] , ADDRESS: [place] [address] PCP Albina Billet, MD Patient Care Team: Albina Billet, MD as PCP - General (Internal Medicine) Clent Jacks, RN as Oncology Nurse Navigator Earlie Server, MD as Consulting Physician (Oncology)  This Provider for this visit: Treatment Team:  Attending Provider: Brand Males, MD    08/31/2020 -   Chief Complaint  Patient presents with  . Follow-up    Reports chest congestion for past week, started on bactrim by primary doctor. Productive cough got worse Friday, yellowish or clear sputum.     Follow-up idiopathic pulmonary fibrosis: Diagnosed in mid July 18, 2020 [onset prechemo even in April 2021 at that time asymptomatic, made worse after chemo]  Chronic prednisone therapy started for ILD flareup with chemotherapy in summer/fall 2021 -on slow taper  HPI Garrett Peterson 75 y.o. -presents for follow-up.  After seeing me last we presented him at ILD conference in March 2022.  Diagnosis of IPF has been given.  He then followed up with nurse practitioner.  Referral for right heart catheterization was made.  So far he has not had a right heart catheterization has seen a cardiologist do not know why.  Nintedanib was started and prescription was made.  He now has a supply at home but is yet to start this.  This is because in the interim unable 13 2022 he developed worsening cough with significant mucus production that is discolored.  Primary care physician has given him Bactrim and now he is returned almost back to baseline.  He has 2 more days of Bactrim left.  He has started pulmonary rehabilitation.  He does this on 4 L according to his note and  chart review.  He says he does not need any more oxygen beyond the 4 L.  Rehabilitation is on hold because of his bronchitis.  His grandson [name] is here with him for the first time.  I am meeting him for the first time.  [name] is concerned about starting nintedanib because of patient's baseline history of irritable bowel syndrome.  No clear-cut medications to make this worse.  Patient is alternating diarrhea and constipation.  But he denies any diverticulitis.  And is any nausea vomiting.  Denies any anticoagulants.  He has previous history of coronary artery disease s/p CABB 2010. Echo Jan 2022 normal.  Ofev decided feb 2022 prior to ILD case confernce on basis of progression   Of note he is still on prednisone.  His prednisone has now been tapered to 5 mg/day.  Other than the recent bronchitis has not noticed any problems tapering the prednisone.  SYMPTOM SCALE - ILD 07/04/2020  08/02/2020  08/31/2020   O2 use 4L  4L 4L use (RA at rest 92% with talking at rest 86-88%)2  Shortness of Breath 0 -> 5 scale with 5 being worst (score 6 If unable to do)    At rest 1 0 2  Simple tasks - showers, clothes change, eating, shaving _0 Household (dishes, doing bed, laundry) x x 4  Shopping x x 4  Walking level at own pace _1 Walking up Stairs _2 Total (30-36) Dyspnea Score _3 How bad is your cough? 0 0 2  How bad is your fatigue _4 How  bad is nausea 0 0 0  How bad is vomiting?  0 0 0  How bad is diarrhea? 0 0 0  How bad is anxiety? _0 How bad is depression _1 PFT  PFT Results Latest Ref Rng & Units 08/02/2020  FVC-Pre L 1.61  FVC-Predicted Pre % 41  Pre FEV1/FVC % % 96  FEV1-Pre L 1.55  FEV1-Predicted Pre % 55  DLCO uncorrected ml/min/mmHg 12.41  DLCO UNC% % 52  DLCO corrected ml/min/mmHg 12.27  DLCO COR %Predicted % 52  DLVA Predicted % 103       has a past medical history of Actinic keratosis, Adenomatous polyp of colon, Arthritis, Basal cell  carcinoma, CAD (coronary artery disease), Cancer ([place]) (2021), Hyperlipidemia, Hypertension, Pneumonia, Pre-diabetes, and Respiratory failure with hypoxia ([place]).   reports that he has never smoked. He has quit using smokeless tobacco.  His smokeless tobacco use included chew.  Past Surgical History:  Procedure Laterality Date  . APPENDECTOMY    . BRONCHIAL WASHINGS  05/18/2020   Procedure: BRONCHIAL WASHINGS;  Surgeon: Margaretha Seeds, MD;  Location: [facility] ENDOSCOPY;  Service: Pulmonary;;  . CARDIAC CATHETERIZATION     PT DENIES  . COLONOSCOPY     2011,2015,2021  . COLONOSCOPY WITH PROPOFOL N/A 07/10/2020   Procedure: COLONOSCOPY WITH PROPOFOL;  Surgeon: Toledo, Benay Pike, MD;  Location: [facility] ENDOSCOPY;  Service: Gastroenterology;  Laterality: N/A;  . CORONARY ARTERY BYPASS GRAFT    . FLEXIBLE BRONCHOSCOPY N/A 04/24/2020   Procedure: FLEXIBLE BRONCHOSCOPY;  Surgeon: Ottie Glazier, MD;  Location: [facility] ORS;  Service: Thoracic;  Laterality: N/A;  . IR SINUS/FIST TUBE CHK-NON GI  10/27/2019  . open heart surgery    . PORTACATH PLACEMENT Right 10/13/2019   Procedure: INSERTION PORT-A-CATH;  Surgeon: Herbert Pun, MD;  Location: [facility] ORS;  Service: General;  Laterality: Right;  . ROBOT ASSISTED LAPAROSCOPIC PARTIAL COLECTOMY  09/2019  . VIDEO BRONCHOSCOPY N/A 05/18/2020   Procedure: VIDEO BRONCHOSCOPY WITHOUT FLUORO;  Surgeon: Margaretha Seeds, MD;  Location: [place];  Service: Pulmonary;  Laterality: N/A;    No Known Allergies  Immunization History  Administered Date(s) Administered  . PFIZER(Purple Top)SARS-COV-2 Vaccination 01/18/2020  . Zoster Recombinat (Shingrix) 01/12/2018, 03/21/2018    Family History  Problem Relation Age of Onset  . Lung disease Father      Current Outpatient Medications:  .  albuterol (VENTOLIN HFA) 108 (90 Base) MCG/ACT inhaler, Inhale 2 puffs into the lungs every 6 (six) hours as needed for wheezing or shortness of breath., Disp: 1 each, Rfl:  0 .  atorvastatin (LIPITOR) 20 MG tablet, Take 20 mg by mouth daily., Disp: , Rfl:  .  capecitabine (XELODA) 500 MG tablet, Take 3 tablets (1,500 mg total) by mouth 2 (two) times daily after a meal. Take Monday through Friday during radiation., Disp: 126 tablet, Rfl: 0 .  dextromethorphan-guaiFENesin (MUCINEX DM) 30-600 MG 12hr tablet, Take 1 tablet by mouth 2 (two) times daily as needed for cough., Disp: 60 tablet, Rfl: 0 .  lidocaine-prilocaine (EMLA) cream, Apply to affected area once, Disp: 30 g, Rfl: 3 .  metoprolol tartrate (LOPRESSOR) 25 MG tablet, Take 25 mg by mouth 2 (two) times daily., Disp: , Rfl:  .  Nintedanib (OFEV) 150 MG CAPS, Take 150 mg by mouth 2 (two) times daily., Disp: , Rfl:  .  sertraline (ZOLOFT) 25 MG tablet, Take 1 tablet (25 mg total) by mouth daily., Disp: 30 tablet,  Rfl: 0 .  sulfamethoxazole-trimethoprim (BACTRIM) 400-80 MG tablet, Take 1 tablet by mouth every 12 (twelve) hours., Disp: 60 tablet, Rfl: 0 .  temazepam (RESTORIL) 30 MG capsule, Take 30 mg by mouth at bedtime. , Disp: , Rfl:       Objective:   Vitals:   08/31/20 0927  BP: 128/72  Pulse: 81  Temp: 97.8 F (36.6 C)  TempSrc: Temporal  SpO2: 98%  Weight: 223 lb (101.2 kg)  Height: _0  (1.753 m)    Estimated body mass index is 32.93 kg/m as calculated from the following:   Height as of this encounter: _1  (1.753 m).   Weight as of this encounter: 223 lb (101.2 kg).  _2 @  [facility] Weights   08/31/20 0927  Weight: 223 lb (101.2 kg)     Physical Exam  General: No distress. sittin in wheel chair Neuro: Alert and Oriented x 3. GCS 15. Speech normal Psych: Pleasant Resp:  Barrel Chest - no.  Wheeze - no, Crackles - yes, No overt respiratory distress CVS: Normal heart sounds. Murmurs - no Ext: Stigmata of Connective Tissue Disease - no HEENT: Normal upper airway. PEERL +. No post nasal drip. O2 on        Assessment:       ICD-10-CM   1. Chronic hypoxemic respiratory  failure (HCC)  J96.11   2. IPF (idiopathic pulmonary fibrosis) ([place])  J84.112   3. Physical deconditioning  R53.81   4. Medication monitoring encounter  Z51.81   5. Acute bronchitis, unspecified organism  J20.9        Plan:     Patient Instructions     ICD-10-CM   1. Chronic hypoxemic respiratory failure (HCC)  J96.11   2. IPF (idiopathic pulmonary fibrosis) ([place])  J84.112   3. Physical deconditioning  R53.81   4. Medication monitoring encounter  Z51.81   5. Acute bronchitis, unspecified organism  J20.9    Glad overall stable with IPF Glad you are enjoying pulmonary rehabilitation and managing that with 4 L nasal cannula Noted that you have handle reducing to 5 mg prednisone per day well Noted that you have not yet started nintedanib [although you have it at home]  -Noted that you have a history of irritable bowel syndrome  Noted that you developed acute bronchitis August 23, 2020 and are on a 10-day Bactrim course Noted that you are getting better from this  You do need right heart catheterization  Plan - Complete Bactrim in the next few days [acute bronchitis] -Continue oxygen 4 L -Okay to restart pulm rehabilitation -Start nintedanib with 150 mg once daily for 1 week and then escalate to 150 mg twice daily  -Once he started nintedanib we will get a better baseline idea about irritable bowel and will make determinations on how to address that -Refer Dr Kate Sable [facility] cardiology in [place] if for right heart catheterization  -If he cannot do it, then see Dr Adrian Prows in [place]  -Any elevation pulmonary artery pressure will qualify you for treprostinil  Follow-up - Telephone visit with Dr. Chase Caller or face-to-face visit with nurse practitioner in [place] in 3 weeks - > to ensure good tolerance to ofev  ( Level 05 visit: Estb 40-54 min   in  visit type: on-site physical face to visit  in total care time and counseling or/and coordination of care by this  undersigned MD - Dr Brand Males. This includes one or more of the following on this same  day 08/31/2020: pre-charting, chart review, note writing, documentation discussion of test results, diagnostic or treatment recommendations, prognosis, risks and benefits of management options, instructions, education, compliance or risk-factor reduction. It excludes time spent by the [place] or office staff in the care of the patient. Actual time 40 min)    SIGNATURE    Dr. Brand Males, M.D., F.C.C.P,  Pulmonary and Critical Care Medicine Staff Physician, [place] Director - Interstitial Lung Disease  Program  Pulmonary [place] at [place], [place], [address]  Pager: [phone], If no answer or between  15:00h - 7:00h: call [phone] Telephone: [phone]  10:18 AM 08/31/2020

## 2020-09-04 ENCOUNTER — Telehealth: Payer: Self-pay | Admitting: *Deleted

## 2020-09-04 ENCOUNTER — Encounter: Payer: Self-pay | Admitting: *Deleted

## 2020-09-04 DIAGNOSIS — J9611 Chronic respiratory failure with hypoxia: Secondary | ICD-10-CM

## 2020-09-04 DIAGNOSIS — J849 Interstitial pulmonary disease, unspecified: Secondary | ICD-10-CM

## 2020-09-04 NOTE — Telephone Encounter (Signed)
Called to check on patient. He was out sick last week with bronchitis.  His doctor cleared him to return once he feels up to it.  He is starting to feel better now.  He hopes to return on Friday 4/29.

## 2020-09-07 ENCOUNTER — Telehealth: Payer: Self-pay | Admitting: Internal Medicine

## 2020-09-07 DIAGNOSIS — J84112 Idiopathic pulmonary fibrosis: Secondary | ICD-10-CM

## 2020-09-07 DIAGNOSIS — I25119 Atherosclerotic heart disease of native coronary artery with unspecified angina pectoris: Secondary | ICD-10-CM

## 2020-09-07 NOTE — Telephone Encounter (Signed)
Per Dr. Chase Caller patient did not disclose specific cardiology hx and it is ok to have patient schedule with established provider office per his request.   MD will change referral .  Nothing further needed from [facility] [place] at this time.   Closing encounter.

## 2020-09-07 NOTE — Telephone Encounter (Signed)
Spoke with patient.  He doesn't want to change care from [facility].  Patient aware we will discuss with ordering MD and call him back to advise .

## 2020-09-07 NOTE — Telephone Encounter (Signed)
Garrett Peterson I just found out from [place] cardiology that patient actually is a patient of [facility] clinic cardiology in [place].  And he does not want to change providers.  Therefore [facility] health cardiology called me about it.  Plan - Please place a new referral to [facility] clinic cardiology in [place] to arrange for a right heart cath

## 2020-09-07 NOTE — Telephone Encounter (Signed)
Pt was seen by MR 4/21 and it was discussed at that visit for pt to have a right heart cath. Order placed for pt to have referral to [facility] cardiology clinic in [place] right heart cath. Nothing further needed.

## 2020-09-07 NOTE — Telephone Encounter (Signed)
Spoke with patient .  He is aware referral will be changed to [facility] in [place] and if he doesn't hear back from that office to call them tomorrow.

## 2020-09-08 ENCOUNTER — Telehealth: Payer: Self-pay

## 2020-09-08 NOTE — Telephone Encounter (Signed)
Referral entered in error for [facility] and patient was scheduled.     Spoke with patient .  He is ok with changing providers  and agrees to be seen here for eval.     Patient scheduled with Dr. Saunders Revel 5/18

## 2020-09-08 NOTE — Telephone Encounter (Signed)
Spoke with patient .  He is ok with changing providers and agrees to be seen here for eval.     Patient scheduled with Dr. Saunders Revel 5/18

## 2020-09-08 NOTE — Telephone Encounter (Signed)
Referral placed again for [facility] instead of [facility] for cath eval.  Patient was scheduled for a visit by another scheduler.    Attempted to contact patient to confirm he wants to change providers.   No ans no vm .    Will attempt again .

## 2020-09-11 ENCOUNTER — Encounter: Payer: Medicare Other | Attending: Internal Medicine

## 2020-09-11 ENCOUNTER — Telehealth: Payer: Self-pay

## 2020-09-11 DIAGNOSIS — J849 Interstitial pulmonary disease, unspecified: Secondary | ICD-10-CM | POA: Insufficient documentation

## 2020-09-11 DIAGNOSIS — J9611 Chronic respiratory failure with hypoxia: Secondary | ICD-10-CM | POA: Insufficient documentation

## 2020-09-11 NOTE — Telephone Encounter (Signed)
Called to check in with Garrett Peterson as he was planning to return to rehab since being out sick Friday 4/29 and we have not heard from him and did not see him Friday or this morning. LMOM.

## 2020-09-11 NOTE — Progress Notes (Signed)
Called to check in with Garrett Peterson as he was planning to return to rehab since being out sick Friday 4/29 and we have not heard from him and did not see him Friday or this morning. LMOM.

## 2020-09-18 ENCOUNTER — Telehealth: Payer: Self-pay

## 2020-09-18 NOTE — Telephone Encounter (Signed)
Called Phil again to check in as he has been cleared to come back and we have not heard from him since 4/25 - last attended 08/21/20

## 2020-09-21 ENCOUNTER — Other Ambulatory Visit: Payer: Self-pay

## 2020-09-21 ENCOUNTER — Encounter: Payer: Self-pay | Admitting: Primary Care

## 2020-09-21 ENCOUNTER — Ambulatory Visit (INDEPENDENT_AMBULATORY_CARE_PROVIDER_SITE_OTHER): Payer: Medicare Other | Admitting: Primary Care

## 2020-09-21 VITALS — BP 122/72 | HR 57 | Temp 97.8°F | Ht 68.0 in | Wt 226.0 lb

## 2020-09-21 DIAGNOSIS — M7989 Other specified soft tissue disorders: Secondary | ICD-10-CM | POA: Diagnosis not present

## 2020-09-21 DIAGNOSIS — J84112 Idiopathic pulmonary fibrosis: Secondary | ICD-10-CM

## 2020-09-21 DIAGNOSIS — R600 Localized edema: Secondary | ICD-10-CM

## 2020-09-21 LAB — BRAIN NATRIURETIC PEPTIDE: Pro B Natriuretic peptide (BNP): 116 pg/mL — ABNORMAL HIGH (ref 0.0–100.0)

## 2020-09-21 LAB — CBC
HCT: 39.2 % (ref 39.0–52.0)
Hemoglobin: 13.5 g/dL (ref 13.0–17.0)
MCHC: 34.4 g/dL (ref 30.0–36.0)
MCV: 99.6 fl (ref 78.0–100.0)
Platelets: 136 10*3/uL — ABNORMAL LOW (ref 150.0–400.0)
RBC: 3.94 Mil/uL — ABNORMAL LOW (ref 4.22–5.81)
RDW: 13.6 % (ref 11.5–15.5)
WBC: 12.4 10*3/uL — ABNORMAL HIGH (ref 4.0–10.5)

## 2020-09-21 LAB — HEPATIC FUNCTION PANEL
ALT: 39 U/L (ref 0–53)
AST: 34 U/L (ref 0–37)
Albumin: 3.8 g/dL (ref 3.5–5.2)
Alkaline Phosphatase: 97 U/L (ref 39–117)
Bilirubin, Direct: 0.1 mg/dL (ref 0.0–0.3)
Total Bilirubin: 0.5 mg/dL (ref 0.2–1.2)
Total Protein: 6.5 g/dL (ref 6.0–8.3)

## 2020-09-21 MED ORDER — PREDNISONE 5 MG PO TABS: 5.0000 mg | ORAL_TABLET | Freq: Every day | ORAL | 2 refills | Status: DC

## 2020-09-21 NOTE — Progress Notes (Signed)
_0  ID: Garrett Peterson, male    DOB: 03-May-1946, 75 y.o.   MRN: [id number]  Chief Complaint  Patient presents with  . Follow-up    Doing ok, winded with activities    Referring provider: Albina Billet, MD  HPI: 75 year old male, never smoked.  Significant for chronic hypoxic respiratory failure, coronary artery disease, hypertension.  Patient of Dr. Chase Caller. Started OFEV in April 2022.   Previous [facility] pulmonary encounter: 07/04/20- Dr. Chase Caller  Referred to the ILD center for comprehensive evaluation by Dr. Rodman Pickle primary pulmonologist  Garrett Peterson 75 y.o. -very complicated story.  Details of the story is copied and pasted above.  Meeting him for the first time with his wife.  It appears that he was previously healthy as of April 2021.  Although CT scan of the chest at the time of my personal visualization showed presence of early ILD [CT abdomen 10 years prior in 2011 had no ILD].  He was diagnosed with anorectal cancer.  He was undergoing treatment at [facility]  Chemotherapy as above.  Then in the fall/winter 2020 when he started falling ill with respiratory illnesses.  This is all documented above.  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  CT scan of the chest by October 2021 started showing significant amount of groundglass opacities.  He only had ANA test, rheumatoid factor and angiotensin-converting enzyme.  This is all negative.  For serology.  Otherwise has not had any other serology.  He had hypersensitive pneumonitis panel this was negative.  This was at outside facility [facility] clinic pulmonology program.  He has had 2 hospitalizations the most recent one being in January.  It appears that he was treated with prednisone each time.  It is unclear to me as this to him and his wife if he has been on chronic prednisone but reading the notes it appears so.  Currently is just finished a taper starting at 60 mg/day in January and finished at 20 mg and stop.  He says since  stopping yesterday or today he is beginning to feel a little bit worse.  Room air oxygen at rest was fine but when he desaturated after he walked more than 2 laps.  However he needed 5 L to correct.  Most recent echocardiogram was normal.  Most recent lavage showed mostly neutrophils.  Again culture negative.  His most recent CT scan of the chest in February 2022 without contrast shows improvement in groundglass opacities but no fibrosis.  His serologies ANA negative and rheumatoid factor negative and angiotensin converting enzyme negative  Definitely earlier on in the year it was not consistent with UIP and suggested alternate diagnosis.  Unclear to me if he has probable UIP pattern at this point in time in February 2022.  We did not have time to do an extensive detailed ILD questionnaire.  It appears that his goals are to get better and also feel better at the same time control his cancer.  They are also wondering about prognosis.  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  He is currently looking into getting radiation locally.  Oncologist wants to do a colonoscopy.  She feels the recurrence is because all his chemo has been on hold because of respiratory issues.  SYMPTOM SCALE - ILD 07/04/2020   O2 use 4L  Shortness of Breath 0 -> 5 scale with 5 being worst (score 6 If unable to do)  At rest 1  Simple tasks - showers, clothes change, eating, shaving 4  Household (dishes, doing bed, laundry) x  Shopping x  Walking level at own pace 3  Walking up Stairs 4  Total (30-36) Dyspnea Score 12  How bad is your cough? 0  How bad is your fatigue 4  How bad is nausea 0  How bad is vomiting?  0  How bad is diarrhea? 0  How bad is anxiety? 5  How bad is depression 5    SErology Dec 2021 - [name]  ANA Direct - [facility] Negative  Negative   RA Latex Turbid. - [facility] 0.0 - 13.9 IU/mL <10.0   A.Fumigatus #1 Abs - [facility] Negative Negative  Micropolyspora faeni, IgG - [facility] Negative Negative  Thermoactinomyces vulgaris, IgG - [facility] Negative Negative  A. Pullulans Abs - [facility] Negative Negative  Thermoact. Saccharii - [facility] Negative Negative  [place] Serum Abs - [facility] Negative Negative   Angio Convert Enzyme - [facility] 14 - 82 U/L 26   Walk tst 07/04/2020    94% - ra at rest -> walked 2 laps without desat and dropped to 86% in middle of 3rd lap. Then need 5L [place] to correct to walk 3 laps in office   CT chest 06/23/20   IMPRESSION: 1. 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 Fibrosis has progressed since 03/02/2020 chest CT with resolved consolidative opacities. 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 UIP is difficult to exclude but is less favored. Follow-up high-resolution chest CT suggested in 6-12 months. Findings are indeterminate for UIP per consensus guidelines: Diagnosis of Idiopathic Pulmonary Fibrosis: An Official ATS/ERS/JRS/ALAT Clinical Practice Guideline. [place], Iss 5, [phone], Jan 11 2017. 2. Stable mild cardiomegaly. 3. Aortic Atherosclerosis (ICD10-I70.0).   Electronically Signed   By: [name] M.D.   On: 06/24/2020 15:15     08/02/2020 Patient presents today for 1 month follow-up with spirometry/DLCO. Patient was discussed at ILD conference in March 2022, diagnosed with IPF. Recent exacerbation likely chemo related. Started on Ofev. Patient was referred for right heart cath and referred to pulmonary rehab.  He is currently on an extended prednisone  taper. He started 52m prednisone dose today x 2 weeks. He is still taking Bactrim. Breathing has improved. He has not started antifibrotic medication yet, his wifes states that their insurance has improved OFEV medication.   PFT 08/02/2020 - FVC 1.61 (41%), FEV1 1.55 (55%), ratio 96, DLCOcor 12.27 (52%)   SYMPTOM SCALE - ILD 07/04/2020  08/02/2020   O2 use 4L  4L  Shortness of Breath 0 -> 5 scale with 5 being worst (score 6 If unable to do)   At rest 1 0  Simple tasks - showers, clothes change, eating, shaving 4 3  Household (dishes, doing bed, laundry) x x  Shopping x x  Walking level at own pace 3 3  Walking up Stairs 4 4  Total (30-36) Dyspnea Score 12 10  How bad is your cough? 0 0  How bad is your fatigue 4 3  How bad is nausea 0 0  How bad is vomiting?  0 0  How bad is  diarrhea? 0 0  How bad is anxiety? 5 3  How bad is depression 5 3   09/21/2020 Patient presents today for 1 month follow-up. Accompanied by his son. He stop pulmonary for a week or two since having bronchitis. Cough is better since taking mucinex. He is on 4L nasal cannula. He is having 6-7 loose stools a day. Diarrhea started when he went up to 2 tabs of Ofev. He has up coming hearth cath, date to be determined. Son reports some new leg swelling. Due for LFTs today.    No Known Allergies  Immunization History  Administered Date(s) Administered  . PFIZER(Purple Top)SARS-COV-2 Vaccination 01/18/2020  . Zoster Recombinat (Shingrix) 01/12/2018, 03/21/2018    Past Medical History:  Diagnosis Date  . Actinic keratosis   . Adenomatous polyp of colon   . Arthritis   . Basal cell carcinoma   . CAD (coronary artery disease)   . Cancer ([place]) 2021   rectal  . Hyperlipidemia   . Hypertension   . Pneumonia   . Pre-diabetes   . Respiratory failure with hypoxia (HCC)     Tobacco History: Social History   Tobacco Use  Smoking Status Never Smoker  Smokeless Tobacco Former Systems developer  . Types: Chew   Counseling  given: Yes   Outpatient Medications Prior to Visit  Medication Sig Dispense Refill  . albuterol (VENTOLIN HFA) 108 (90 Base) MCG/ACT inhaler Inhale 2 puffs into the lungs every 6 (six) hours as needed for wheezing or shortness of breath. 1 each 0  . atorvastatin (LIPITOR) 20 MG tablet Take 20 mg by mouth daily.    . capecitabine (XELODA) 500 MG tablet Take 3 tablets (1,500 mg total) by mouth 2 (two) times daily after a meal. Take Monday through Friday during radiation. 126 tablet 0  . dextromethorphan-guaiFENesin (MUCINEX DM) 30-600 MG 12hr tablet Take 1 tablet by mouth 2 (two) times daily as needed for cough. 60 tablet 0  . lidocaine-prilocaine (EMLA) cream Apply to affected area once 30 g 3  . metoprolol tartrate (LOPRESSOR) 25 MG tablet Take 25 mg by mouth 2 (two) times daily.    . Nintedanib (OFEV) 150 MG CAPS Take 150 mg by mouth 2 (two) times daily.    . sertraline (ZOLOFT) 25 MG tablet Take 1 tablet (25 mg total) by mouth daily. 30 tablet 0  . sulfamethoxazole-trimethoprim (BACTRIM) 400-80 MG tablet Take 1 tablet by mouth every 12 (twelve) hours. 60 tablet 0  . temazepam (RESTORIL) 30 MG capsule Take 30 mg by mouth at bedtime.      No facility-administered medications prior to visit.    Review of Systems  Review of Systems  Constitutional: Negative.   Respiratory: Positive for shortness of breath. Negative for cough and wheezing.   Gastrointestinal: Positive for diarrhea.    Physical Exam  BP 122/72 (BP Location: Left Arm, Cuff Size: Normal)   Pulse (!) 57   Temp 97.8 F (36.6 C) (Oral)   Ht _0  (1.727 m)   Wt 226 lb (102.5 kg)   SpO2 100%   BMI 34.36 kg/m  Physical Exam Constitutional:      Appearance: Normal appearance.  Cardiovascular:     Rate and Rhythm: Normal rate and regular rhythm.     Comments: +1-2 BLE edema  Musculoskeletal:        General: Normal range of motion.  Skin:    General: Skin is warm and dry.  Neurological:     General: No focal  deficit  present.     Mental Status: He is alert and oriented to person, place, and time. Mental status is at baseline.  Psychiatric:        Mood and Affect: Mood normal.        Behavior: Behavior normal.        Thought Content: Thought content normal.        Judgment: Judgment normal.      Lab Results:  CBC    Component Value Date/Time   WBC 12.3 (H) 06/14/2020 1146   RBC 4.39 06/14/2020 1146   HGB 15.0 06/14/2020 1146   HGB 14.4 09/20/2011 1104   HCT 42.7 06/14/2020 1146   HCT 41.1 09/20/2011 1104   PLT 158 06/14/2020 1146   PLT 135 (L) 09/20/2011 1104   MCV 97.3 06/14/2020 1146   MCV 99 09/20/2011 1104   MCH 34.2 (H) 06/14/2020 1146   MCHC 35.1 06/14/2020 1146   RDW 12.9 06/14/2020 1146   RDW 12.9 09/20/2011 1104   LYMPHSABS 1.0 06/14/2020 1146   LYMPHSABS 1.9 09/20/2011 1104   MONOABS 0.5 06/14/2020 1146   MONOABS 0.4 09/20/2011 1104   EOSABS 0.1 06/14/2020 1146   EOSABS 0.2 09/20/2011 1104   BASOSABS 0.0 06/14/2020 1146   BASOSABS 0.1 09/20/2011 1104    BMET    Component Value Date/Time   NA 139 08/15/2020 0858   NA 142 09/20/2011 1104   K 3.9 08/15/2020 0858   K 3.8 09/20/2011 1104   CL 99 08/15/2020 0858   CL 107 09/20/2011 1104   CO2 25 08/15/2020 0858   CO2 28 09/20/2011 1104   GLUCOSE 182 (H) 08/15/2020 0858   GLUCOSE 279 (H) 06/14/2020 1146   GLUCOSE 119 (H) 09/20/2011 1104   BUN 13 08/15/2020 0858   BUN 12 09/20/2011 1104   CREATININE 0.97 08/15/2020 0858   CREATININE 0.99 09/20/2011 1104   CALCIUM 9.2 08/15/2020 0858   CALCIUM 8.8 09/20/2011 1104   GFRNONAA >60 06/14/2020 1146   GFRNONAA >60 09/20/2011 1104   GFRAA >60 02/02/2020 0817   GFRAA >60 09/20/2011 1104    BNP    Component Value Date/Time   BNP 84.1 03/03/2020 0930    ProBNP No results found for: PROBNP  Imaging: No results found.   Assessment & Plan:   IPF (idiopathic pulmonary fibrosis) ([place]) - Patient has been experiencing 5-6 loose stools daily with Ofev. This  started when he began taking 2 tablet daily.  - Plan Stop Ofev for 1 week. Then restart  Ofev 135m one tablet daily x 1 week; then return to full dose 2 tablets daily - Avoid artifical sweeteners. He can take Imodium 453m(2 tablets) after first loose BM and then 3m74m1 tablet) after each loose BM. NO more than 8mg46mr 4 tablets) in 24 hours.   Orders: - Labs today (LFTs and CBC)  Follow-up: - 4 weeks with Dr. RamaChase Caller clinic   Lower extremity edema - Checking BNP - Planning upcoming heart cath     ElizMartyn Ehrich 09/21/2020

## 2020-09-21 NOTE — Patient Instructions (Addendum)
Recommendations: - Stop Ofev for 1 week - Then restart Ofev 1 tablet daily x 1 week; then return to full dose 2 tablets daily - Avoid artifical sweeteners   - You can take Imodium 15m (2 tablets) after first loose BM and then 234m(1 tablet) after each loose BM. NO more than 18m24mor 4 tablets) in 24 hours.   Orders: - Labs today   Follow-up: - 4 weeks with Dr. RamChase CallerD clinic (if nothing available than [facility])

## 2020-09-21 NOTE — Progress Notes (Signed)
LFTs were normal. BNP (fluid level) was slightly elevated but nothing acutely concerning. Recommend waiting until heart cath is done for further recommendations. If evidence of heart failure would possibly benefit from diuretic

## 2020-09-21 NOTE — Assessment & Plan Note (Signed)
-  Patient has been experiencing 5-6 loose stools daily with Ofev. This started when Garrett Peterson began taking 2 tablet daily.  - Plan Stop Ofev for 1 week. Then restart  Ofev 11m one tablet daily x 1 week; then return to full dose 2 tablets daily - Avoid artifical sweeteners. Garrett Peterson can take Imodium 465m(2 tablets) after first loose BM and then 44m76m1 tablet) after each loose BM. NO more than 8mg49mr 4 tablets) in 24 hours.   Orders: - Labs today (LFTs and CBC)  Follow-up: - 4 weeks with Dr. RamaChase [facility]

## 2020-09-21 NOTE — Assessment & Plan Note (Signed)
-  Checking BNP - Planning upcoming heart cath

## 2020-09-26 ENCOUNTER — Other Ambulatory Visit: Payer: Self-pay

## 2020-09-26 DIAGNOSIS — J849 Interstitial pulmonary disease, unspecified: Secondary | ICD-10-CM | POA: Diagnosis not present

## 2020-09-26 DIAGNOSIS — J9611 Chronic respiratory failure with hypoxia: Secondary | ICD-10-CM | POA: Diagnosis present

## 2020-09-26 NOTE — Progress Notes (Signed)
Daily Session Note  Patient Details  Name: Garrett Peterson MRN: [id number] Date of Birth: [birth year] Referring Provider:   Flowsheet Row Pulmonary Rehab from 08/10/2020 in [facility]  Referring Provider Brand Males MD      Encounter Date: 09/26/2020  Check In:  Session Check In - 09/26/20 0922      Check-In   Supervising physician immediately available to respond to emergencies See telemetry face sheet for immediately available ER MD    Location [facility]-Cardiac & Pulmonary Rehab    Staff Present Birdie Sons, MPA, RN;Melissa Caiola RDN, Rowe Pavy, BA, ACSM CEP, Exercise Physiologist;Joseph Tessie Fass RCP,RRT,BSRT    Virtual Visit No    Medication changes reported     No    Fall or balance concerns reported    No    Warm-up and Cool-down Performed on first and last piece of equipment    Resistance Training Performed Yes    VAD Patient? No    PAD/SET Patient? No      Pain Assessment   Currently in Pain? No/denies              Social History   Tobacco Use  Smoking Status Never Smoker  Smokeless Tobacco Former Systems developer  . Types: Chew    Goals Met:  Independence with exercise equipment Exercise tolerated well Personal goals reviewed No report of cardiac concerns or symptoms Strength training completed today  Goals Unmet:  Not Applicable  Comments: Pt able to follow exercise prescription today without complaint.  Will continue to monitor for progression.    Dr. Emily Filbert is Medical Director for [facility].

## 2020-09-27 ENCOUNTER — Encounter: Payer: Self-pay | Admitting: *Deleted

## 2020-09-27 ENCOUNTER — Encounter: Payer: Self-pay | Admitting: Internal Medicine

## 2020-09-27 ENCOUNTER — Other Ambulatory Visit: Payer: Self-pay

## 2020-09-27 ENCOUNTER — Ambulatory Visit (INDEPENDENT_AMBULATORY_CARE_PROVIDER_SITE_OTHER): Payer: Medicare Other | Admitting: Internal Medicine

## 2020-09-27 VITALS — BP 130/76 | HR 59 | Ht 69.0 in | Wt 227.0 lb

## 2020-09-27 DIAGNOSIS — I25119 Atherosclerotic heart disease of native coronary artery with unspecified angina pectoris: Secondary | ICD-10-CM

## 2020-09-27 DIAGNOSIS — Z01818 Encounter for other preprocedural examination: Secondary | ICD-10-CM | POA: Diagnosis not present

## 2020-09-27 DIAGNOSIS — I2510 Atherosclerotic heart disease of native coronary artery without angina pectoris: Secondary | ICD-10-CM | POA: Diagnosis not present

## 2020-09-27 DIAGNOSIS — Z01812 Encounter for preprocedural laboratory examination: Secondary | ICD-10-CM

## 2020-09-27 DIAGNOSIS — J9611 Chronic respiratory failure with hypoxia: Secondary | ICD-10-CM

## 2020-09-27 DIAGNOSIS — R0602 Shortness of breath: Secondary | ICD-10-CM | POA: Diagnosis not present

## 2020-09-27 DIAGNOSIS — J84112 Idiopathic pulmonary fibrosis: Secondary | ICD-10-CM | POA: Diagnosis not present

## 2020-09-27 DIAGNOSIS — E785 Hyperlipidemia, unspecified: Secondary | ICD-10-CM

## 2020-09-27 DIAGNOSIS — J849 Interstitial pulmonary disease, unspecified: Secondary | ICD-10-CM

## 2020-09-27 DIAGNOSIS — I10 Essential (primary) hypertension: Secondary | ICD-10-CM

## 2020-09-27 MED ORDER — ASPIRIN EC 81 MG PO TBEC: 81.0000 mg | DELAYED_RELEASE_TABLET | Freq: Every day | ORAL | Status: DC

## 2020-09-27 NOTE — Progress Notes (Signed)
 New Outpatient Visit Date: 09/27/2020  Referring Provider: Neurology Ramaswamy, MD [facility]  Chief Complaint: Shortness of breath  HPI:  Garrett Peterson is a 75 y.o. male who is being seen today for the evaluation of shortness of breath in the setting of interstitial lung disease at the request of Dr. Ramaswamy. He has a history of coronary artery disease status post CABG in 2010 (LIMA to LAD, SVG to ramus, SVG to OM1, and SVG to PDA), rectal cancer, and hyperlipidemia.  Garrett Peterson reports progressive shortness of breath for at least a year.  Initially, it was thought that his symptoms were related to prior COVID-19 infection.  However, further work-up due to progressive dyspnea/hypoxia has revealed findings most consistent with pulmonary fibrosis.  He was previously on Ofev, though this is currently on hold due to GI symptoms.  Garrett Peterson notes that prior to his CABG in 2010 he did not have any chest pain.  His main symptom leading to catheterization and ultimate CABG at [facility] were shortness of breath and fatigue.  He has not had any chest pain nor palpitations, lightheadedness, orthopnea, or PND.  He has experienced occasional swelling in his feet and ankles over the last few months.  He has not had an ischemia evaluation since his CABG in 2010.  --------------------------------------------------------------------------------------------------  Cardiovascular History & Procedures: Cardiovascular Problems:  Coronary artery disease  Interstitial lung disease  Risk Factors:  Known coronary artery disease, hyperlipidemia, male gender, and age greater than 65  Cath/PCI:  None available  CV Surgery:  CABG (11/2008 at [facility]): LIMA to LAD, SVG to ramus, SVG to OM1, and SVG to PDA  EP Procedures and Devices:  None  Non-Invasive Evaluation(s):  TTE (05/18/2020): Normal LV size with moderate LVH.  LVEF 65-70% with grade 1 diastolic dysfunction.  Mildly dilated RV with mildly reduced  contraction.  Mild pulmonary hypertension.  Normal biatrial size.  Mild mitral and tricuspid regurgitation.  Negative bubble study.  Recent CV Pertinent Labs: Lab Results  Component Value Date   INR 1.1 05/17/2020   BNP 84.1 03/03/2020   K 4.7 09/27/2020   K 3.8 09/20/2011   MG 2.3 03/03/2020   BUN 7 (L) 09/27/2020   BUN 12 09/20/2011   CREATININE 0.77 09/27/2020   CREATININE 0.99 09/20/2011    --------------------------------------------------------------------------------------------------  Past Medical History:  Diagnosis Date  . Actinic keratosis   . Adenomatous polyp of colon   . Arthritis   . Basal cell carcinoma   . CAD (coronary artery disease)   . Cancer (HCC) 2021   rectal  . Hyperlipidemia   . Hypertension   . Pneumonia   . Pre-diabetes   . Respiratory failure with hypoxia (HCC)     Past Surgical History:  Procedure Laterality Date  . APPENDECTOMY    . BRONCHIAL WASHINGS  05/18/2020   Procedure: BRONCHIAL WASHINGS;  Surgeon: Ellison, Chi Jane, MD;  Location: [facility];  Service: Pulmonary;;  . CARDIAC CATHETERIZATION     PT DENIES  . COLONOSCOPY     2011,2015,2021  . COLONOSCOPY WITH PROPOFOL N/A 07/10/2020   Procedure: COLONOSCOPY WITH PROPOFOL;  Surgeon: Toledo, Teodoro K, MD;  Location: [facility] ENDOSCOPY;  Service: Gastroenterology;  Laterality: N/A;  . CORONARY ARTERY BYPASS GRAFT    . FLEXIBLE BRONCHOSCOPY N/A 04/24/2020   Procedure: FLEXIBLE BRONCHOSCOPY;  Surgeon: Aleskerov, Fuad, MD;  Location: [facility] ORS;  Service: Thoracic;  Laterality: N/A;  . IR SINUS/FIST TUBE CHK-NON GI  10/27/2019  . open heart surgery    .   PORTACATH PLACEMENT Right 10/13/2019   Procedure: INSERTION PORT-A-CATH;  Surgeon: Cintron-Diaz, Edgardo, MD;  Location: [facility] ORS;  Service: General;  Laterality: Right;  . ROBOT ASSISTED LAPAROSCOPIC PARTIAL COLECTOMY  09/2019  . VIDEO BRONCHOSCOPY N/A 05/18/2020   Procedure: VIDEO BRONCHOSCOPY WITHOUT FLUORO;  Surgeon: Ellison, Chi Jane, MD;   Location: [facility];  Service: Pulmonary;  Laterality: N/A;    Current Meds  Medication Sig  . aspirin EC 81 MG tablet Take 1 tablet (81 mg total) by mouth daily. Swallow whole.  . atorvastatin (LIPITOR) 20 MG tablet Take 20 mg by mouth daily.  . dextromethorphan-guaiFENesin (MUCINEX DM) 30-600 MG 12hr tablet Take 1 tablet by mouth 2 (two) times daily as needed for cough.  . metoprolol tartrate (LOPRESSOR) 25 MG tablet Take 25 mg by mouth 2 (two) times daily.  . predniSONE (DELTASONE) 5 MG tablet Take 1 tablet (5 mg total) by mouth daily with breakfast.  . temazepam (RESTORIL) 30 MG capsule Take 30 mg by mouth at bedtime.     Allergies: Patient has no known allergies.  Social History   Tobacco Use  . Smoking status: Never Smoker  . Smokeless tobacco: Former User    Types: Chew  Vaping Use  . Vaping Use: Never used  Substance Use Topics  . Alcohol use: Yes    Alcohol/week: 0.0 standard drinks    Comment: OCCAS-not within the last year as of 09/27/2020  . Drug use: Never    Family History  Problem Relation Age of Onset  . Lung disease Father     Review of Systems: A 12-system review of systems was performed and was negative except as noted in the HPI.  --------------------------------------------------------------------------------------------------  Physical Exam: BP 130/76 (BP Location: Right Arm, Patient Position: Sitting, Cuff Size: Large)   Pulse (!) 59   Ht 5' 9" (1.753 m)   Wt 227 lb (103 kg)   SpO2 97% Comment: on 4L O2  BMI 33.52 kg/m   General: NAD.  Accompanied by his grandson. HEENT: No conjunctival pallor or scleral icterus. Facemask in place. Neck: Supple without lymphadenopathy, thyromegaly, JVD, or HJR. No carotid bruit. Lungs: Normal work of breathing.  Coarse breath sounds throughout without wheezes or crackles. Heart: Regular rate and rhythm without murmurs, rubs, or gallops. Non-displaced PMI. Abd: Bowel sounds present. Soft, NT/ND without  hepatosplenomegaly Ext: No lower extremity edema. Radial, PT, and DP pulses are 2+ bilaterally Skin: Warm and dry without rash. Neuro: CNIII-XII intact. Strength and fine-touch sensation intact in upper and lower extremities bilaterally. Psych: Normal mood and affect.  EKG: Sinus bradycardia with inferior infarct, poor R wave progression (may reflect lead placement), and lateral T wave inversions.  Compared with prior tracing from 06/05/2020, lateral T wave inversions and poor R wave progression are new.  Lab Results  Component Value Date   WBC 9.3 09/27/2020   HGB 14.2 09/27/2020   HCT 41.6 09/27/2020   MCV 102 (H) 09/27/2020   PLT 171 09/27/2020    Lab Results  Component Value Date   NA 144 09/27/2020   K 4.7 09/27/2020   CL 103 09/27/2020   CO2 26 09/27/2020   BUN 7 (L) 09/27/2020   CREATININE 0.77 09/27/2020   GLUCOSE 140 (H) 09/27/2020   ALT 39 09/21/2020    No results found for: CHOL, HDL, LDLCALC, LDLDIRECT, TRIG, CHOLHDL   --------------------------------------------------------------------------------------------------  ASSESSMENT AND PLAN: Shortness of breath, coronary artery disease, pulmonary fibrosis, and chronic respiratory failure with hypoxia: Garrett Peterson has experienced worsening   shortness of breath and oxygen requirement over the last year, most likely driven by his pulmonary fibrosis.  It is notable, however, that his anginal equivalent leading up to CABG in 2010 was shortness of breath.  We have discussed further evaluation options and have agreed to proceed with right and left heart catheterization with possible PCI.  In the meantime, I have encouraged Garrett Peterson to begin taking aspirin 81 mg daily.  Hypertension: Blood pressure borderline elevated today.  We will defer medication changes at this time.  Hyperlipidemia: No recent lipid panel available.  Given history of CAD, we will plan to continue atorvastatin 20 mg daily with plans for target LDL less  than 70.  Shared Decision Making/Informed Consent The risks [stroke (1 in 1000), death (1 in 1000), kidney failure [usually temporary] (1 in 500), bleeding (1 in 200), allergic reaction [possibly serious] (1 in 200)], benefits (diagnostic support and management of coronary artery disease) and alternatives of a cardiac catheterization were discussed in detail with Garrett Peterson and he is willing to proceed.  Follow-up: Return to clinic in 2 to 3 weeks.  Raysa Bosak, MD 09/28/2020 7:02 AM  

## 2020-09-27 NOTE — Patient Instructions (Addendum)
Medication Instructions:  - Your physician has recommended you make the following change in your medication:   1) START aspirin 81 mg- take 1 tablet by mouth once daily   *If you need a refill on your cardiac medications before your next appointment, please call your pharmacy*   Lab Work: - Your physician recommends that you have lab work today: BMP/ CBC  If you have labs (blood work) drawn today and your tests are completely normal, you will receive your results only by: [name] Kitchen MyChart Message (if you have MyChart) OR . A paper copy in the mail If you have any lab test that is abnormal or we need to change your treatment, we will call you to review the results.   Testing/Procedures: - Your physician has requested that you have a cardiac catheterization. Cardiac catheterization is used to diagnose and/or treat various heart conditions. Doctors may recommend this procedure for a number of different reasons. The most common reason is to evaluate chest pain. Chest pain can be a symptom of coronary artery disease (CAD), and cardiac catheterization can show whether plaque is narrowing or blocking your heart's arteries. This procedure is also used to evaluate the valves, as well as measure the blood flow and oxygen levels in different parts of your heart.     [address], [place] [address] Dept: [phone] Loc: [phone]  BARTOLO MONTANYE  09/27/2020  You are scheduled for a Cardiac Catheterization on Tuesday, May 24 with Dr. Harrell Gave End.  1. Please arrive at the [facility] at [facility] at 8:30 AM (This time is one hour before your procedure to ensure your preparation). Free valet parking service is available. 1st desk on the right, Registration, to check in.   Special note: Every effort is made to have your procedure done on time. Please understand that emergencies  sometimes delay scheduled procedures.  2. Diet: Do not eat solid foods after midnight.  The patient may have clear liquids until 5am upon the day of the procedure.  3. Labs: today  4. Medication instructions in preparation for your procedure:   Contrast Allergy: No    On the morning of your procedure, take your Aspirin and any morning medicines NOT listed above.  You may use sips of water.  5. Plan for one night stay--bring personal belongings. 6. Bring a current list of your medications and current insurance cards. 7. You MUST have a responsible person to drive you home. 8. Someone MUST be with you the first 24 hours after you arrive home or your discharge will be delayed. 9. Please wear clothes that are easy to get on and off and wear slip-on shoes.  Thank you for allowing [place] to care for you!   -- [facility]    Follow-Up: At [facility], you and your health needs are our priority.  As part of our continuing mission to provide you with exceptional heart care, we have created designated Provider Care Teams.  These Care Teams include your primary Cardiologist (physician) and Advanced Practice Providers (APPs -  Physician Assistants and Nurse Practitioners) who all work together to provide you with the care you need, when you need it.  We recommend signing up for the patient portal called "MyChart".  Sign up information is provided on this After Visit Summary.  MyChart is used to connect with patients for Virtual Visits (Telemedicine).  Patients are  able to view lab/test results, encounter notes, upcoming appointments, etc.  Non-urgent messages can be sent to your provider as well.   To learn more about what you can do with MyChart, go to [email or web address].    Your next appointment:   2-3 week(s)  The format for your next appointment:   In Person  Provider:   You may see Nelva Bush, MD or one of the following Advanced Practice  Providers on your designated Care Team:    [name], NP  Christell Faith, PA-C  Marrianne Mood, PA-C  Cadence Kathlen Mody, [place]  Laurann Montana, NP    Other Instructions    Coronary Angiogram A coronary angiogram is an X-ray procedure that is used to examine the arteries in the heart. Contrast dye is injected through a long, thin tube (catheter) into these arteries. Then X-rays are taken to show any blockage in these arteries. You may have this procedure if you:  Are having chest pain, or other symptoms of angina, and you are at risk for heart disease.  Have an abnormal stress test or test of your heart's electrical activity (electrocardiogram, or ECG).  Have chest pain and heart failure.  Are having irregular heart rhythms. A coronary angiogram or heart catheterization can show if you have valve disease or a disease of the aorta. This procedure can also be used to check the overall function of your heart muscle. Let your health care provider know about:  Any allergies you have, including allergies to medicines or contrast dye.  All medicines you are taking, including vitamins, herbs, eye drops, creams, and over-the-counter medicines.  Any problems you or family members have had with anesthetic medicines.  Any blood disorders you have.  Any surgeries you have had.  Any history of kidney problems or kidney failure.  Any medical conditions you have.  Whether you are pregnant or may be pregnant.  Whether you are breastfeeding. What are the risks? Generally, this is a safe procedure. However, problems may occur, including:  Infection.  Allergic reaction to medicines or dyes that are used.  Bleeding from the insertion site or other places.  Damage to nearby structures, such as blood vessels, or damage to kidneys from contrast dye.  Irregular heart rhythms.  Stroke (rare).  Heart attack (rare). What happens before the procedure? Staying hydrated Follow  instructions from your health care provider about hydration, which may include:  Up to 2 hours before the procedure - you may continue to drink clear liquids, such as water, clear fruit juice, black coffee, and plain tea.   Eating and drinking restrictions Follow instructions from your health care provider about eating and drinking, which may include:  8 hours before the procedure - stop eating heavy meals or foods, such as meat, fried foods, or fatty foods.  6 hours before the procedure - stop eating light meals or foods, such as toast or cereal.  6 hours before the procedure - stop drinking milk or drinks that contain milk.  2 hours before the procedure - stop drinking clear liquids. Medicines Ask your health care provider about:  Changing or stopping your regular medicines. This is especially important if you are taking diabetes medicines or blood thinners.  Taking medicines such as aspirin and ibuprofen. These medicines can thin your blood. Do not take these medicines unless your health care provider tells you to take them. Aspirin may be recommended before coronary angiograms even if you do not normally take it.  Taking over-the-counter medicines, vitamins,  herbs, and supplements. General instructions  Do not use any products that contain nicotine or tobacco for at least 4 weeks before the procedure. These products include cigarettes, e-cigarettes, and chewing tobacco. If you need help quitting, ask your health care provider.  You may have an exam or testing.  Plan to have someone take you home from the hospital or clinic.  If you will be going home right after the procedure, plan to have someone with you for 24 hours.  Ask your health care provider: ? How your insertion site will be marked. ? What steps will be taken to help prevent infection. These may include:  Removing hair at the insertion site.  Washing skin with a germ-killing soap.  Taking antibiotic  medicine. What happens during the procedure?  You will lie on your back on an X-ray table.  An IV will be inserted into one of your veins.  Electrodes will be placed on your chest.  You will be given one or more of the following: ? A medicine to help you relax (sedative). ? A medicine to numb the catheter insertion area (local anesthetic).  You will be connected to a continuous ECG monitor.  The catheter will be inserted into an artery in one of these areas: ? Your groin area in your upper thigh. ? Your wrist. ? The fold of your arm, near your elbow.  An X-ray procedure (fluoroscopy) will be used to help guide the catheter to the opening of the blood vessel to be used.  A dye will be injected into the catheter and X-rays will be taken. The dye will help to show any narrowing or blockages in the heart arteries.  Tell your health care provider if you have chest pain or trouble breathing.  If blockages are found, another procedure may be done to open the artery.  The catheter will be removed after the fluoroscopy is complete.  A bandage (dressing) will be placed over the insertion site. Pressure will be applied to stop bleeding.  The IV will be removed. The procedure may vary among health care providers and hospitals.   What happens after the procedure?  Your blood pressure, heart rate, breathing rate, and blood oxygen level will be monitored until you leave the hospital or clinic.  You will need to lie still for a few hours, or for as long as told by your health care provider. ? If the procedure is done through the groin, you will be told not to bend or cross your legs.  The insertion site and the pulse in your foot or wrist will be checked often.  More blood tests, X-rays, and an ECG may be done.  Do not drive for 24 hours if you were given a sedative during your procedure. Summary  A coronary angiogram is an X-ray procedure that is used to examine the arteries in the  heart.  Contrast dye is injected through a long, thin tube (catheter) into each artery.  Tell your health care provider about any allergies you have, including allergies to contrast dye.  After the procedure, you will need to lie still for a few hours and drink plenty of fluids. This information is not intended to replace advice given to you by your health care provider. Make sure you discuss any questions you have with your health care provider. Document Revised: 11/19/2018 Document Reviewed: 11/19/2018 Elsevier Patient Education  [place].

## 2020-09-27 NOTE — Progress Notes (Signed)
Pulmonary Individual Treatment Plan  Patient Details  Name: Garrett Peterson MRN: [id number] Date of Birth: August 29, 1945 Referring Provider:   Flowsheet Row Pulmonary Rehab from 08/10/2020 in [facility] Cardiac and Pulmonary Rehab  Referring Provider Brand Males MD      Initial Encounter Date:  Flowsheet Row Pulmonary Rehab from 08/10/2020 in [facility] Cardiac and Pulmonary Rehab  Date 08/10/20      Visit Diagnosis: ILD (interstitial lung disease) ([place])  Patient's Home Medications on Admission:  Current Outpatient Medications:  .  albuterol (VENTOLIN HFA) 108 (90 Base) MCG/ACT inhaler, Inhale 2 puffs into the lungs every 6 (six) hours as needed for wheezing or shortness of breath., Disp: 1 each, Rfl: 0 .  atorvastatin (LIPITOR) 20 MG tablet, Take 20 mg by mouth daily., Disp: , Rfl:  .  capecitabine (XELODA) 500 MG tablet, Take 3 tablets (1,500 mg total) by mouth 2 (two) times daily after a meal. Take Monday through Friday during radiation., Disp: 126 tablet, Rfl: 0 .  dextromethorphan-guaiFENesin (MUCINEX DM) 30-600 MG 12hr tablet, Take 1 tablet by mouth 2 (two) times daily as needed for cough., Disp: 60 tablet, Rfl: 0 .  lidocaine-prilocaine (EMLA) cream, Apply to affected area once, Disp: 30 g, Rfl: 3 .  metoprolol tartrate (LOPRESSOR) 25 MG tablet, Take 25 mg by mouth 2 (two) times daily., Disp: , Rfl:  .  Nintedanib (OFEV) 150 MG CAPS, Take 150 mg by mouth 2 (two) times daily., Disp: , Rfl:  .  predniSONE (DELTASONE) 5 MG tablet, Take 1 tablet (5 mg total) by mouth daily with breakfast., Disp: 30 tablet, Rfl: 2 .  sertraline (ZOLOFT) 25 MG tablet, Take 1 tablet (25 mg total) by mouth daily., Disp: 30 tablet, Rfl: 0 .  sulfamethoxazole-trimethoprim (BACTRIM) 400-80 MG tablet, Take 1 tablet by mouth every 12 (twelve) hours., Disp: 60 tablet, Rfl: 0 .  temazepam (RESTORIL) 30 MG capsule, Take 30 mg by mouth at bedtime. , Disp: , Rfl:   Past Medical History: Past Medical History:  Diagnosis  Date  . Actinic keratosis   . Adenomatous polyp of colon   . Arthritis   . Basal cell carcinoma   . CAD (coronary artery disease)   . Cancer ([place]) 2021   rectal  . Hyperlipidemia   . Hypertension   . Pneumonia   . Pre-diabetes   . Respiratory failure with hypoxia (HCC)     Tobacco Use: Social History   Tobacco Use  Smoking Status Never Smoker  Smokeless Tobacco Former Systems developer  . Types: Chew    Labs: Recent Review Flowsheet Data   There is no flowsheet data to display.      Pulmonary Assessment Scores:  Pulmonary Assessment Scores    Row Name 08/10/20 1004         ADL UCSD   ADL Phase Entry     SOB Score total 66     Rest 0     Walk 1     Stairs 4     Bath 1     Dress 1     Shop 3           CAT Score   CAT Score 18           mMRC Score   mMRC Score 4            UCSD: Self-administered rating of dyspnea associated with activities of daily living (ADLs) 6-point scale (0 = "not at all" to 5 = "maximal or unable to  do because of breathlessness")  Scoring Scores range from 0 to 120.  Minimally important difference is 5 units  CAT: CAT can identify the health impairment of COPD patients and is better correlated with disease progression.  CAT has a scoring range of zero to 40. The CAT score is classified into four groups of low (less than 10), medium (10 - 20), high (21-30) and very high (31-40) based on the impact level of disease on health status. A CAT score over 10 suggests significant symptoms.  A worsening CAT score could be explained by an exacerbation, poor medication adherence, poor inhaler technique, or progression of COPD or comorbid conditions.  CAT MCID is 2 points  mMRC: mMRC (Modified Medical Research Council) Dyspnea Scale is used to assess the degree of baseline functional disability in patients of respiratory disease due to dyspnea. No minimal important difference is established. A decrease in score of 1 point or greater is considered a  positive change.   Pulmonary Function Assessment:   Exercise Target Goals: Exercise Program Goal: Individual exercise prescription set using results from initial 6 min walk test and THRR while considering  patient's activity barriers and safety.   Exercise Prescription Goal: Initial exercise prescription builds to 30-45 minutes a day of aerobic activity, 2-3 days per week.  Home exercise guidelines will be given to patient during program as part of exercise prescription that the participant will acknowledge.  Education: Aerobic Exercise: - Group verbal and visual presentation on the components of exercise prescription. Introduces F.I.T.T principle from ACSM for exercise prescriptions.  Reviews F.I.T.T. principles of aerobic exercise including progression. Written material given at graduation. Flowsheet Row Pulmonary Rehab from 08/10/2020 in [facility] Cardiac and Pulmonary Rehab  Education need identified 08/10/20      Education: Resistance Exercise: - Group verbal and visual presentation on the components of exercise prescription. Introduces F.I.T.T principle from ACSM for exercise prescriptions  Reviews F.I.T.T. principles of resistance exercise including progression. Written material given at graduation.    Education: Exercise & Equipment Safety: - Individual verbal instruction and demonstration of equipment use and safety with use of the equipment. Flowsheet Row Pulmonary Rehab from 08/10/2020 in [facility] Cardiac and Pulmonary Rehab  Date 08/10/20  Educator [facility]  Instruction Review Code 1- Verbalizes Understanding      Education: Exercise Physiology & General Exercise Guidelines: - Group verbal and written instruction with models to review the exercise physiology of the cardiovascular system and associated critical values. Provides general exercise guidelines with specific guidelines to those with heart or lung disease.    Education: Flexibility, Balance, Mind/Body Relaxation: - Group  verbal and visual presentation with interactive activity on the components of exercise prescription. Introduces F.I.T.T principle from ACSM for exercise prescriptions. Reviews F.I.T.T. principles of flexibility and balance exercise training including progression. Also discusses the mind body connection.  Reviews various relaxation techniques to help reduce and manage stress (i.e. Deep breathing, progressive muscle relaxation, and visualization). Balance handout provided to take home. Written material given at graduation.   Activity Barriers & Risk Stratification:  Activity Barriers & Cardiac Risk Stratification - 08/10/20 0954      Activity Barriers & Cardiac Risk Stratification   Activity Barriers Deconditioning;Shortness of Breath;Muscular Weakness;Balance Concerns           6 Minute Walk:  6 Minute Walk    Row Name 08/10/20 0952         6 Minute Walk   Phase Initial     Distance 400 feet  Walk Time 3 minutes  stopped due to desaturation     # of Rest Breaks 0     MPH 1.52     METS 0.66     RPE 13     Perceived Dyspnea  3     VO2 Peak 2.29     Symptoms Yes (comment)     Comments SOB, chest tightness 4/10     Resting HR 57 bpm     Resting BP 132/64     Resting Oxygen Saturation  98 %     Exercise Oxygen Saturation  during 6 min walk 79 %     Max Ex. HR 89 bpm     Max Ex. BP 148/74     2 Minute Post BP 136/70           Interval HR   1 Minute HR 74     2 Minute HR 84     3 Minute HR 89     2 Minute Post HR 71     Interval Heart Rate? Yes           Interval Oxygen   Interval Oxygen? Yes     Baseline Oxygen Saturation % 98 %     1 Minute Oxygen Saturation % 89 %     1 Minute Liters of Oxygen 4 L  continuous     2 Minute Oxygen Saturation % 83 %     2 Minute Liters of Oxygen 4 L     3 Minute Oxygen Saturation % 79 %     3 Minute Liters of Oxygen 4 L     2 Minute Post Oxygen Saturation % 97 %     2 Minute Post Liters of Oxygen 4 L           Oxygen Initial  Assessment:  Oxygen Initial Assessment - 08/10/20 1003      Home Oxygen   Home Oxygen Device E-Tanks    Sleep Oxygen Prescription Continuous    Liters per minute 4    Home Exercise Oxygen Prescription Continuous    Liters per minute 4    Home Resting Oxygen Prescription Continuous    Liters per minute 4    Compliance with Home Oxygen Use Yes      Initial 6 min Walk   Oxygen Used Continuous;E-Tanks    Liters per minute 4      Program Oxygen Prescription   Program Oxygen Prescription Continuous;E-Tanks    Liters per minute 4      Intervention   Short Term Goals To learn and exhibit compliance with exercise, home and travel O2 prescription;To learn and understand importance of monitoring SPO2 with pulse oximeter and demonstrate accurate use of the pulse oximeter.;To learn and understand importance of maintaining oxygen saturations>88%;To learn and demonstrate proper use of respiratory medications;To learn and demonstrate proper pursed lip breathing techniques or other breathing techniques.    Long  Term Goals Exhibits compliance with exercise, home and travel O2 prescription;Verbalizes importance of monitoring SPO2 with pulse oximeter and return demonstration;Maintenance of O2 saturations>88%;Exhibits proper breathing techniques, such as pursed lip breathing or other method taught during program session;Compliance with respiratory medication;Demonstrates proper use of MDI's           Oxygen Re-Evaluation:  Oxygen Re-Evaluation    Row Name 08/14/20 6712 09/26/20 0942           Program Oxygen Prescription   Program Oxygen Prescription Continuous;E-Tanks Continuous;E-Tanks  Liters per minute 4 4             Home Oxygen   Home Oxygen Device E-Tanks Home Concentrator;E-Tanks      Sleep Oxygen Prescription Continuous Continuous      Liters per minute 4 4      Home Exercise Oxygen Prescription Continuous Continuous      Liters per minute 4 4      Home Resting Oxygen  Prescription Continuous Continuous      Liters per minute 4 4      Compliance with Home Oxygen Use Yes Yes             Goals/Expected Outcomes   Short Term Goals To learn and demonstrate proper pursed lip breathing techniques or other breathing techniques. Other      Long  Term Goals Exhibits proper breathing techniques, such as pursed lip breathing or other method taught during program session Other      Comments Reviewed PLB technique with pt.  Talked about how it works and it's importance in maintaining their exercise saturations. Patient has been sick with Bronchitis and is now ready to resume rehab. He wants to get his shortness of breath under control and work on his breathing techniques.      Goals/Expected Outcomes Short: Become more profiecient at using PLB.   Long: Become independent at using PLB. Short: continue rehab and work on breathing techniques. Long: reduce shortness of breath.             Oxygen Discharge (Final Oxygen Re-Evaluation):  Oxygen Re-Evaluation - 09/26/20 0942      Program Oxygen Prescription   Program Oxygen Prescription Continuous;E-Tanks    Liters per minute 4      Home Oxygen   Home Oxygen Device Home Concentrator;E-Tanks    Sleep Oxygen Prescription Continuous    Liters per minute 4    Home Exercise Oxygen Prescription Continuous    Liters per minute 4    Home Resting Oxygen Prescription Continuous    Liters per minute 4    Compliance with Home Oxygen Use Yes      Goals/Expected Outcomes   Short Term Goals Other    Long  Term Goals Other    Comments Patient has been sick with Bronchitis and is now ready to resume rehab. He wants to get his shortness of breath under control and work on his breathing techniques.    Goals/Expected Outcomes Short: continue rehab and work on breathing techniques. Long: reduce shortness of breath.           Initial Exercise Prescription:  Initial Exercise Prescription - 08/10/20 0900      Date of Initial  Exercise RX and Referring Provider   Date 08/10/20    Referring Provider Brand Males MD      Oxygen   Oxygen Continuous    Liters 4      Treadmill   MPH 1.5    Grade 0    Minutes 15   2 min walk 1 min rest   METs 2.15      Recumbant Bike   Level 1    RPM 50    Watts 10    Minutes 15    METs 2      NuStep   Level 1    SPM 80    Minutes 15    METs 2      Prescription Details   Frequency (times per week) 3    Duration  Progress to 30 minutes of continuous aerobic without signs/symptoms of physical distress      Intensity   THRR 40-80% of Max Heartrate 92-127    Ratings of Perceived Exertion 11-13    Perceived Dyspnea 0-4      Progression   Progression Continue to progress workloads to maintain intensity without signs/symptoms of physical distress.      Resistance Training   Training Prescription Yes    Weight 3 lb    Reps 10-15           Perform Capillary Blood Glucose checks as needed.  Exercise Prescription Changes:  Exercise Prescription Changes    Row Name 08/10/20 0900 08/21/20 0800 09/04/20 1500         Response to Exercise   Blood Pressure (Admit) 132/64 138/74 134/62     Blood Pressure (Exercise) 148/74 160/76 162/72     Blood Pressure (Exit) 124/72 122/60 122/64     Heart Rate (Admit) 57 bpm 59 bpm 69 bpm     Heart Rate (Exercise) 89 bpm 85 bpm 107 bpm     Heart Rate (Exit) 68 bpm 75 bpm 100 bpm     Oxygen Saturation (Admit) 98 % 95 % 98 %     Oxygen Saturation (Exercise) 79 % 86 % 89 %     Oxygen Saturation (Exit) 95 % 95 % 90 %     Rating of Perceived Exertion (Exercise) _0 Perceived Dyspnea (Exercise) _1 Symptoms SOB, chest tightness 4/10 SOB SOB     Comments walk test results third day from last day attended 4/11     Duration -- Progress to 30 minutes of  aerobic without signs/symptoms of physical distress Progress to 30 minutes of  aerobic without signs/symptoms of physical distress     Intensity -- THRR unchanged  THRR unchanged           Progression   Progression -- Continue to progress workloads to maintain intensity without signs/symptoms of physical distress. Continue to progress workloads to maintain intensity without signs/symptoms of physical distress.     Average METs -- 2.07 1.9           Resistance Training   Training Prescription -- Yes Yes     Weight -- 3 lb 3 lb     Reps -- 10-15 10-15           Interval Training   Interval Training -- -- No           Oxygen   Oxygen -- Continuous Continuous     Liters -- 4 4           Treadmill   MPH -- 1.5 --     Grade -- 0 --     Minutes -- 15 --     METs -- 2.15 --           Recumbant Bike   Level -- -- 1     Minutes -- -- 15           NuStep   Level -- 3 3     Minutes -- 15 15     METs -- 2 1.9            Exercise Comments:  Exercise Comments    Row Name 08/14/20 0810 08/21/20 0814         Exercise Comments First full day of exercise!  Patient was oriented to  gym and equipment including functions, settings, policies, and procedures.  Patient's individual exercise prescription and treatment plan were reviewed.  All starting workloads were established based on the results of the 6 minute walk test done at initial orientation visit.  The plan for exercise progression was also introduced and progression will be customized based on patient's performance and goals. Forgets to replace oxygen cannula after blowing nose.  Sats drop to low 80's             Exercise Goals and Review:  Exercise Goals    Row Name 08/10/20 1000             Exercise Goals   Increase Physical Activity Yes       Intervention Provide advice, education, support and counseling about physical activity/exercise needs.;Develop an individualized exercise prescription for aerobic and resistive training based on initial evaluation findings, risk stratification, comorbidities and participant's personal goals.       Expected Outcomes Short Term: Attend  rehab on a regular basis to increase amount of physical activity.;Long Term: Add in home exercise to make exercise part of routine and to increase amount of physical activity.;Long Term: Exercising regularly at least 3-5 days a week.       Increase Strength and Stamina Yes       Intervention Develop an individualized exercise prescription for aerobic and resistive training based on initial evaluation findings, risk stratification, comorbidities and participant's personal goals.;Provide advice, education, support and counseling about physical activity/exercise needs.       Expected Outcomes Short Term: Increase workloads from initial exercise prescription for resistance, speed, and METs.;Short Term: Perform resistance training exercises routinely during rehab and add in resistance training at home;Long Term: Improve cardiorespiratory fitness, muscular endurance and strength as measured by increased METs and functional capacity (6MWT)       Able to understand and use rate of perceived exertion (RPE) scale Yes       Intervention Provide education and explanation on how to use RPE scale       Expected Outcomes Short Term: Able to use RPE daily in rehab to express subjective intensity level;Long Term:  Able to use RPE to guide intensity level when exercising independently       Able to understand and use Dyspnea scale Yes       Intervention Provide education and explanation on how to use Dyspnea scale       Expected Outcomes Short Term: Able to use Dyspnea scale daily in rehab to express subjective sense of shortness of breath during exertion;Long Term: Able to use Dyspnea scale to guide intensity level when exercising independently       Knowledge and understanding of Target Heart Rate Range (THRR) Yes       Intervention Provide education and explanation of THRR including how the numbers were predicted and where they are located for reference       Expected Outcomes Short Term: Able to state/look up  THRR;Short Term: Able to use daily as guideline for intensity in rehab;Long Term: Able to use THRR to govern intensity when exercising independently       Able to check pulse independently Yes       Intervention Provide education and demonstration on how to check pulse in carotid and radial arteries.;Review the importance of being able to check your own pulse for safety during independent exercise       Expected Outcomes Short Term: Able to explain why pulse checking is important during independent  exercise;Long Term: Able to check pulse independently and accurately       Understanding of Exercise Prescription Yes       Intervention Provide education, explanation, and written materials on patient's individual exercise prescription       Expected Outcomes Short Term: Able to explain program exercise prescription;Long Term: Able to explain home exercise prescription to exercise independently              Exercise Goals Re-Evaluation :  Exercise Goals Re-Evaluation    Row Name 08/14/20 0810 08/21/20 0845 09/04/20 1542 09/26/20 0939       Exercise Goal Re-Evaluation   Exercise Goals Review Able to understand and use rate of perceived exertion (RPE) scale;Knowledge and understanding of Target Heart Rate Range (THRR);Understanding of Exercise Prescription;Able to understand and use Dyspnea scale Increase Physical Activity;Increase Strength and Stamina -- Increase Physical Activity;Increase Strength and Stamina    Comments Reviewed RPE and dyspnea scales, THR and program prescription with pt today.  Pt voiced understanding and was given a copy of goals to take home. [place] has had some difficulty with exercise.  His oxygen has dropped to 86%.  Staff reviewed PLB and reminded him to keep cannula in place as it has not been at times during exercise. Out since last review He is back from being sick and now he is ready to get back to exercise.    Expected Outcomes Short: Use RPE daily to regulate intensity.  Long: Follow program prescription in THR. Short: continue to work on PLB Long:  Build overall stamina -- Short: attend [facility] regularly. Long: maintain exercise regularly independently.           Discharge Exercise Prescription (Final Exercise Prescription Changes):  Exercise Prescription Changes - 09/04/20 1500      Response to Exercise   Blood Pressure (Admit) 134/62    Blood Pressure (Exercise) 162/72    Blood Pressure (Exit) 122/64    Heart Rate (Admit) 69 bpm    Heart Rate (Exercise) 107 bpm    Heart Rate (Exit) 100 bpm    Oxygen Saturation (Admit) 98 %    Oxygen Saturation (Exercise) 89 %    Oxygen Saturation (Exit) 90 %    Rating of Perceived Exertion (Exercise) 15    Perceived Dyspnea (Exercise) 2    Symptoms SOB    Comments from last day attended 4/11    Duration Progress to 30 minutes of  aerobic without signs/symptoms of physical distress    Intensity THRR unchanged      Progression   Progression Continue to progress workloads to maintain intensity without signs/symptoms of physical distress.    Average METs 1.9      Resistance Training   Training Prescription Yes    Weight 3 lb    Reps 10-15      Interval Training   Interval Training No      Oxygen   Oxygen Continuous    Liters 4      Recumbant Bike   Level 1    Minutes 15      NuStep   Level 3    Minutes 15    METs 1.9           Nutrition:  Target Goals: Understanding of nutrition guidelines, daily intake of sodium <1576m, cholesterol <2099m calories 30% from fat and 7% or less from saturated fats, daily to have 5 or more servings of fruits and vegetables.  Education: All About Nutrition: -Group instruction provided by verbal,  written material, interactive activities, discussions, models, and posters to present general guidelines for heart healthy nutrition including fat, fiber, MyPlate, the role of sodium in heart healthy nutrition, utilization of the nutrition label, and utilization of  this knowledge for meal planning. Follow up email sent as well. Written material given at graduation.   Biometrics:  Pre Biometrics - 08/10/20 1001      Pre Biometrics   Height 5' 8.9" (1.75 m)    Weight 228 lb (103.4 kg)    BMI (Calculated) 33.77    Single Leg Stand 5.4 seconds            Nutrition Therapy Plan and Nutrition Goals:  Nutrition Therapy & Goals - 08/16/20 1135      Nutrition Therapy   Diet Heart healthy, low Na    Drug/Food Interactions Statins/Certain Fruits    Protein (specify units) 80g    Fiber 30 grams    Whole Grain Foods 3 servings    Saturated Fats 12 max. grams    Fruits and Vegetables 8 servings/day    Sodium 1.5 grams      Personal Nutrition Goals   Nutrition Goal ST: try diabetes friendly desserts from handout, Add different nonstarchy vegetables to 5 or more meals per week LT:    Comments Breakfast: bacon, egg, and sausage sandwich or scrambled eggs or cornflakes (adds sugar and milk) Lunch: part of sandwich and crackers Dinner: meats with potatoes butter beans or corn, grilled cheese sandwich. Take out on the weekends. Snacks: sweets. Drinks: water and soda (zero) and tea (sweet tea - mcdonalds) and coffee (black). Reports T2DM (lifestyle controlled - not sure what A1C is and does not check BG at home), high cholesterol (lipitor has been helping). He had open heart surgery in the past. Discussed heart healthy eating and pulmonary MNT. [name] is interested in making changes.      Intervention Plan   Intervention Nutrition handout(s) given to patient.;Prescribe, educate and counsel regarding individualized specific dietary modifications aiming towards targeted core components such as weight, hypertension, lipid management, diabetes, heart failure and other comorbidities.    Expected Outcomes Short Term Goal: Understand basic principles of dietary content, such as calories, fat, sodium, cholesterol and nutrients.;Short Term Goal: A plan has been developed  with personal nutrition goals set during dietitian appointment.;Long Term Goal: Adherence to prescribed nutrition plan.           Nutrition Assessments:  MEDIFICTS Score Key:  ?70 Need to make dietary changes   40-70 Heart Healthy Diet  ? 40 Therapeutic Level Cholesterol Diet  Flowsheet Row Pulmonary Rehab from 08/10/2020 in [facility] Cardiac and Pulmonary Rehab  Picture Your Plate Total Score on Admission 55     Picture Your Plate Scores:  <09 Unhealthy dietary pattern with much room for improvement.  41-50 Dietary pattern unlikely to meet recommendations for good health and room for improvement.  51-60 More healthful dietary pattern, with some room for improvement.   >60 Healthy dietary pattern, although there may be some specific behaviors that could be improved.   Nutrition Goals Re-Evaluation:  Nutrition Goals Re-Evaluation    [place] Name 09/26/20 0936             Goals   Current Weight 226 lb (102.5 kg)       Nutrition Goal Lose more weight and make diet changes.       Comment He feels like he heats too much bread and he has sweets alot. He is eating ice cream,  cookies and other sweets daily. He is going to try to cute back on the sweets.       Expected Outcome Short: cut back on sweets. Long: limit sweets to a minimum.              Nutrition Goals Discharge (Final Nutrition Goals Re-Evaluation):  Nutrition Goals Re-Evaluation - 09/26/20 0936      Goals   Current Weight 226 lb (102.5 kg)    Nutrition Goal Lose more weight and make diet changes.    Comment He feels like he heats too much bread and he has sweets alot. He is eating ice cream, cookies and other sweets daily. He is going to try to cute back on the sweets.    Expected Outcome Short: cut back on sweets. Long: limit sweets to a minimum.           Psychosocial: Target Goals: Acknowledge presence or absence of significant depression and/or stress, maximize coping skills, provide positive support  system. Participant is able to verbalize types and ability to use techniques and skills needed for reducing stress and depression.   Education: Stress, Anxiety, and Depression - Group verbal and visual presentation to define topics covered.  Reviews how body is impacted by stress, anxiety, and depression.  Also discusses healthy ways to reduce stress and to treat/manage anxiety and depression.  Written material given at graduation.   Education: Sleep Hygiene -Provides group verbal and written instruction about how sleep can affect your health.  Define sleep hygiene, discuss sleep cycles and impact of sleep habits. Review good sleep hygiene tips.    Initial Review & Psychosocial Screening:  Initial Psych Review & Screening - 08/04/20 1508      Initial Review   Current issues with Current Stress Concerns;Current Sleep Concerns    Source of Stress Concerns Unable to participate in former interests or hobbies;Unable to perform yard/household activities;Chronic Illness      Family Dynamics   Good Support System? Yes   God, wife, medical team     Barriers   Psychosocial barriers to participate in program There are no identifiable barriers or psychosocial needs.      Screening Interventions   Interventions Provide feedback about the scores to participant;Encouraged to exercise;To provide support and resources with identified psychosocial needs    Expected Outcomes Short Term goal: Utilizing psychosocial counselor, staff and physician to assist with identification of specific Stressors or current issues interfering with healing process. Setting desired goal for each stressor or current issue identified.;Long Term Goal: Stressors or current issues are controlled or eliminated.;Short Term goal: Identification and review with participant of any Quality of Life or Depression concerns found by scoring the questionnaire.;Long Term goal: The participant improves quality of Life and PHQ9 Scores as seen by  post scores and/or verbalization of changes           Quality of Life Scores:  Scores of 19 and below usually indicate a poorer quality of life in these areas.  A difference of  2-3 points is a clinically meaningful difference.  A difference of 2-3 points in the total score of the Quality of Life Index has been associated with significant improvement in overall quality of life, self-image, physical symptoms, and general health in studies assessing change in quality of life.  PHQ-9: Recent Review Flowsheet Data    Depression screen [facility] 2/9 08/10/2020   Decreased Interest 0   Down, Depressed, Hopeless 0   PHQ - 2 Score 0  Altered sleeping 1   Tired, decreased energy 1   Change in appetite 0   Feeling bad or failure about yourself  0   Trouble concentrating 0   Moving slowly or fidgety/restless 0   Suicidal thoughts 0   PHQ-9 Score 2   Difficult doing work/chores Not difficult at all     Interpretation of Total Score  Total Score Depression Severity:  1-4 = Minimal depression, 5-9 = Mild depression, 10-14 = Moderate depression, 15-19 = Moderately severe depression, 20-27 = Severe depression   Psychosocial Evaluation and Intervention:  Psychosocial Evaluation - 08/04/20 1517      Psychosocial Evaluation & Interventions   Interventions Encouraged to exercise with the program and follow exercise prescription;Stress management education;Relaxation education    Comments Mr. Parcell has had a complicated medical history this last year and a half. He was diagnosed with colon CA last year and then randomnly started experiencing severe shortness of breaht this last fall. 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 He used to spend a lot of his time outdoors doing different activities, but now can't due to the oxygen tanks and shortness of breath. His sleep pattern has been unusual for the last few years with no recent change, where he sleeps only a little some nights and then up the rest  of the night. 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 He received news recently that he was cancer free so now he is focused on his breathing issues. His main goal is to feel better and hopefully be able to wear a concentrator so he can do more of his hobbies outside.    Expected Outcomes Short; attend pulmonary rehab for education and exercise. Long: develop positive self care habits.    Continue Psychosocial Services  Follow up required by staff           Psychosocial Re-Evaluation:  Psychosocial Re-Evaluation    [place] Name 09/26/20 0944             Psychosocial Re-Evaluation   Current issues with Current Stress Concerns;Current Sleep Concerns       Comments His shortness of breath and his age are starting to get to him. He is not sure how much longer he has left. He states that he has never been able to sleep good. He has regrets of not being compassionate with his father and his lung disease.       Expected Outcomes Short: Continue to exercise regularly to support mental health and notify staff of any changes. Long: maintain mental health and well being through teaching of rehab or prescribed medications independently.       Interventions Encouraged to attend Pulmonary Rehabilitation for the exercise       Continue Psychosocial Services  Follow up required by staff               Initial Review   Source of Stress Concerns Chronic Illness              Psychosocial Discharge (Final Psychosocial Re-Evaluation):  Psychosocial Re-Evaluation - 09/26/20 0944      Psychosocial Re-Evaluation   Current issues with Current Stress Concerns;Current Sleep Concerns    Comments His shortness of breath and his age are starting to get to him. He is not sure how much longer he has left. He states that he has never been able to sleep good. He has regrets of  not being compassionate with his father and his lung disease.    Expected Outcomes  Short: Continue to exercise regularly to support mental health and notify staff of any changes. Long: maintain mental health and well being through teaching of rehab or prescribed medications independently.    Interventions Encouraged to attend Pulmonary Rehabilitation for the exercise    Continue Psychosocial Services  Follow up required by staff      Initial Review   Source of Stress Concerns Chronic Illness           Education: Education Goals: Education classes will be provided on a weekly basis, covering required topics. Participant will state understanding/return demonstration of topics presented.  Learning Barriers/Preferences:  Learning Barriers/Preferences - 08/04/20 1508      Learning Barriers/Preferences   Learning Barriers None    Learning Preferences None           General Pulmonary Education Topics:  Infection Prevention: - Provides verbal and written material to individual with discussion of infection control including proper hand washing and proper equipment cleaning during exercise session. Flowsheet Row Pulmonary Rehab from 08/10/2020 in [facility] Cardiac [facility] Pul[facility]  Date 08/10/20  Educator [facility]  Instruction Review Code 1- Verbalizes Understanding      Falls Prevention: - Provides verbal and written material to individual with discussion of falls prevention and safety. Flowsheet Row Pulmonary Rehab from 08/10/2020 in [facility] Cardiac and Pulmonary Rehab  Date 08/10/20  Educator [facility]  Instruction Review Code 1- Verbalizes Understanding      Chronic Lung Disease Review: - Group verbal instruction with posters, models, PowerPoint presentations and videos,  to review new updates, new respiratory medications, new advancements in procedures and treatments. Providing information on websites and "800" numbers for continued self-education. Includes information about supplement oxygen, available portable oxygen systems, continuous and intermittent flow rates, oxygen  safety, concentrators, and Medicare reimbursement for oxygen. Explanation of Pulmonary Drugs, including class, frequency, complications, importance of spacers, rinsing mouth after steroid MDI's, and proper cleaning methods for nebulizers. Review of basic lung anatomy and physiology related to function, structure, and complications of lung disease. Review of risk factors. Discussion about methods for diagnosing sleep apnea and types of masks and machines for OSA. Includes a review of the use of types of environmental controls: home humidity, furnaces, filters, dust mite/pet prevention, HEPA vacuums. Discussion about weather changes, air quality and the benefits of nasal washing. Instruction on Warning signs, infection symptoms, calling MD promptly, preventive modes, and value of vaccinations. Review of effective airway clearance, coughing and/or vibration techniques. Emphasizing that all should Create an Action Plan. Written material given at graduation. Flowsheet Row Pulmonary Rehab from 08/10/2020 in [facility] Cardiac and Pulmonary Rehab  Education need identified 08/10/20      AED/CPR: - Group verbal and written instruction with the use of models to demonstrate the basic use of the AED with the basic ABC's of resuscitation.    Anatomy and Cardiac Procedures: - Group verbal and visual presentation and models provide information about basic cardiac anatomy and function. Reviews the testing methods done to diagnose heart disease and the outcomes of the test results. Describes the treatment choices: Medical Management, Angioplasty, or Coronary Bypass Surgery for treating various heart conditions including Myocardial Infarction, Angina, Valve Disease, and Cardiac Arrhythmias.  Written material given at graduation.   Medication Safety: - Group verbal and visual instruction to review commonly prescribed medications for heart and lung disease. Reviews the medication, class of the drug, and side effects. Includes  the steps to properly store meds and maintain the prescription regimen.  Written material given at graduation.   Other: -Provides group and verbal instruction on various topics (see comments)   Knowledge Questionnaire Score:  Knowledge Questionnaire Score - 08/10/20 1002      Knowledge Questionnaire Score   Pre Score 13/15 Education Focus: O2 safety, exercise            Core Components/Risk Factors/Patient Goals at Admission:  Personal Goals and Risk Factors at Admission - 08/10/20 1002      Core Components/Risk Factors/Patient Goals on Admission    Weight Management Yes;Obesity;Weight Loss    Intervention Weight Management: Develop a combined nutrition and exercise program designed to reach desired caloric intake, while maintaining appropriate intake of nutrient and fiber, sodium and fats, and appropriate energy expenditure required for the weight goal.;Weight Management: Provide education and appropriate resources to help participant work on and attain dietary goals.;Weight Management/Obesity: Establish reasonable short term and long term weight goals.;Obesity: Provide education and appropriate resources to help participant work on and attain dietary goals.    Admit Weight 228 lb (103.4 kg)    Goal Weight: Short Term 220 lb (99.8 kg)    Goal Weight: Long Term 215 lb (97.5 kg)    Expected Outcomes Short Term: Continue to assess and modify interventions until short term weight is achieved;Long Term: Adherence to nutrition and physical activity/exercise program aimed toward attainment of established weight goal;Weight Loss: Understanding of general recommendations for a balanced deficit meal plan, which promotes 1-2 lb weight loss per week and includes a negative energy balance of [id number] kcal/d;Understanding recommendations for meals to include 15-35% energy as protein, 25-35% energy from fat, 35-60% energy from carbohydrates, less than 222m of dietary cholesterol, 20-35 gm of total fiber  daily;Understanding of distribution of calorie intake throughout the day with the consumption of 4-5 meals/snacks    Improve shortness of breath with ADL's Yes    Intervention Provide education, individualized exercise plan and daily activity instruction to help decrease symptoms of SOB with activities of daily living.    Expected Outcomes Short Term: Improve cardiorespiratory fitness to achieve a reduction of symptoms when performing ADLs;Long Term: Be able to perform more ADLs without symptoms or delay the onset of symptoms    Hypertension Yes    Intervention Provide education on lifestyle modifcations including regular physical activity/exercise, weight management, moderate sodium restriction and increased consumption of fresh fruit, vegetables, and low fat dairy, alcohol moderation, and smoking cessation.;Monitor prescription use compliance.    Expected Outcomes Short Term: Continued assessment and intervention until BP is < 140/959mHG in hypertensive participants. < 130/8022mG in hypertensive participants with diabetes, heart failure or chronic kidney disease.;Long Term: Maintenance of blood pressure at goal levels.    Lipids Yes    Intervention Provide education and support for participant on nutrition & aerobic/resistive exercise along with prescribed medications to achieve LDL <109m24mDL >40mg85m Expected Outcomes Short Term: Participant states understanding of desired cholesterol values and is compliant with medications prescribed. Participant is following exercise prescription and nutrition guidelines.;Long Term: Cholesterol controlled with medications as prescribed, with individualized exercise RX and with personalized nutrition plan. Value goals: LDL < 109mg,25m > 40 mg.           Education:Diabetes - Individual verbal and written instruction to review signs/symptoms of diabetes, desired ranges of glucose level fasting, after meals and with exercise. Acknowledge that pre and post  exercise glucose checks will be  done for 3 sessions at entry of program.   Know Your Numbers and Heart Failure: - Group verbal and visual instruction to discuss disease risk factors for cardiac and pulmonary disease and treatment options.  Reviews associated critical values for Overweight/Obesity, Hypertension, Cholesterol, and Diabetes.  Discusses basics of heart failure: signs/symptoms and treatments.  Introduces Heart Failure Zone chart for action plan for heart failure.  Written material given at graduation.   Core Components/Risk Factors/Patient Goals Review:   Goals and Risk Factor Review    Row Name 09/26/20 0935             Core Components/Risk Factors/Patient Goals Review   Personal Goals Review Improve shortness of breath with ADL's       Review Spoke to patient about their shortness of breath and what they can do to improve. Patient has been informed of breathing techniques when starting the program. Patient is informed to tell staff if they have had any med changes and that certain meds they are taking or not taking can be causing shortness of breath.       Expected Outcomes Short: Attend [facility] regularly to improve shortness of breath with ADL's. Long: maintain independence with ADL's              Core Components/Risk Factors/Patient Goals at Discharge (Final Review):   Goals and Risk Factor Review - 09/26/20 0935      Core Components/Risk Factors/Patient Goals Review   Personal Goals Review Improve shortness of breath with ADL's    Review Spoke to patient about their shortness of breath and what they can do to improve. Patient has been informed of breathing techniques when starting the program. Patient is informed to tell staff if they have had any med changes and that certain meds they are taking or not taking can be causing shortness of breath.    Expected Outcomes Short: Attend [facility] regularly to improve shortness of breath with ADL's. Long: maintain independence  with ADL's           ITP Comments:  ITP Comments    Row Name 08/04/20 1504 08/10/20 0950 08/14/20 0810 08/16/20 1141 08/30/20 0949   ITP Comments Initial telephone orientation completed. Diagnosis can be found in [facility] 3/23. EP orientation scheduled for Thursday 3/31 at 8am. Completed 6MWT and gym orientation. Initial ITP created and sent for review to Dr. Emily Filbert, Medical Director. First full day of exercise!  Patient was oriented to gym and equipment including functions, settings, policies, and procedures.  Patient's individual exercise prescription and treatment plan were reviewed.  All starting workloads were established based on the results of the 6 minute walk test done at initial orientation visit.  The plan for exercise progression was also introduced and progression will be customized based on patient's performance and goals. Completed initial RD evaluation 30 Day review completed. Medical Director ITP review done, changes made as directed, and signed approval by Medical Director.   [place] Name 09/04/20 1542 09/27/20 0650         ITP Comments Called to check on patient. He was out sick last week with bronchitis.  His doctor cleared him to return once he feels up to it.  He is starting to feel better now.  He hopes to return on Friday 4/29. 30 Day review completed. Medical Director ITP review done, changes made as directed, and signed approval by Medical Director.  1 visit in May  Comments:

## 2020-09-27 NOTE — H&P (View-Only) (Signed)
New Outpatient Visit Date: 09/27/2020  Referring Provider: Neurology Garrett Caller, MD [facility]  Chief Complaint: Shortness of breath  HPI:  Garrett Peterson is a 75 y.o. male who is being seen today for the evaluation of shortness of breath in the setting of interstitial lung disease at the request of Dr. Chase Peterson. He has a history of coronary artery disease status post CABG in 2010 (LIMA to LAD, SVG to ramus, SVG to OM1, and SVG to PDA), rectal cancer, and hyperlipidemia.  Garrett Peterson reports progressive shortness of breath for at least a year.  Initially, it was thought that his symptoms were related to prior COVID-19 infection.  However, further work-up due to progressive dyspnea/hypoxia has revealed findings most consistent with pulmonary fibrosis.  He was previously on Ofev, though this is currently on hold due to GI symptoms.  Garrett Peterson notes that prior to his CABG in 2010 he did not have any chest pain.  His main symptom leading to catheterization and ultimate CABG at [facility] were shortness of breath and fatigue.  He has not had any chest pain nor palpitations, lightheadedness, orthopnea, or PND.  He has experienced occasional swelling in his feet and ankles over the last few months.  He has not had an ischemia evaluation since his CABG in 2010.  --------------------------------------------------------------------------------------------------  Cardiovascular History & Procedures: Cardiovascular Problems:  Coronary artery disease  Interstitial lung disease  Risk Factors:  Known coronary artery disease, hyperlipidemia, male gender, and age greater than 69  Cath/PCI:  None available  CV Surgery:  CABG (11/2008 at [facility]): LIMA to LAD, SVG to ramus, SVG to OM1, and SVG to PDA  EP Procedures and Devices:  None  Non-Invasive Evaluation(s):  TTE (05/18/2020): Normal LV size with moderate LVH.  LVEF 65-70% with grade 1 diastolic dysfunction.  Mildly dilated RV with mildly reduced  contraction.  Mild pulmonary hypertension.  Normal biatrial size.  Mild mitral and tricuspid regurgitation.  Negative bubble study.  Recent CV Pertinent Labs: Lab Results  Component Value Date   INR 1.1 05/17/2020   BNP 84.1 03/03/2020   K 4.7 09/27/2020   K 3.8 09/20/2011   MG 2.3 03/03/2020   BUN 7 (L) 09/27/2020   BUN 12 09/20/2011   CREATININE 0.77 09/27/2020   CREATININE 0.99 09/20/2011    --------------------------------------------------------------------------------------------------  Past Medical History:  Diagnosis Date  . Actinic keratosis   . Adenomatous polyp of colon   . Arthritis   . Basal cell carcinoma   . CAD (coronary artery disease)   . Cancer ([place]) 2021   rectal  . Hyperlipidemia   . Hypertension   . Pneumonia   . Pre-diabetes   . Respiratory failure with hypoxia [facility])     Past Surgical History:  Procedure Laterality Date  . APPENDECTOMY    . BRONCHIAL WASHINGS  05/18/2020   Procedure: BRONCHIAL WASHINGS;  Surgeon: Margaretha Seeds, MD;  Location: [facility] ENDOSCOPY;  Service: Pulmonary;;  . CARDIAC CATHETERIZATION     PT DENIES  . COLONOSCOPY     2011,2015,2021  . COLONOSCOPY WITH PROPOFOL N/A 07/10/2020   Procedure: COLONOSCOPY WITH PROPOFOL;  Surgeon: Toledo, Benay Pike, MD;  Location: [facility] ENDOSCOPY;  Service: Gastroenterology;  Laterality: N/A;  . CORONARY ARTERY BYPASS GRAFT    . FLEXIBLE BRONCHOSCOPY N/A 04/24/2020   Procedure: FLEXIBLE BRONCHOSCOPY;  Surgeon: Ottie Glazier, MD;  Location: [facility] ORS;  Service: Thoracic;  Laterality: N/A;  . IR SINUS/FIST TUBE CHK-NON GI  10/27/2019  . open heart surgery    .  PORTACATH PLACEMENT Right 10/13/2019   Procedure: INSERTION PORT-A-CATH;  Surgeon: Herbert Pun, MD;  Location: [facility] ORS;  Service: General;  Laterality: Right;  . ROBOT ASSISTED LAPAROSCOPIC PARTIAL COLECTOMY  09/2019  . VIDEO BRONCHOSCOPY N/A 05/18/2020   Procedure: VIDEO BRONCHOSCOPY WITHOUT FLUORO;  Surgeon: Margaretha Seeds, MD;   Location: [place];  Service: Pulmonary;  Laterality: N/A;    Current Meds  Medication Sig  . aspirin EC 81 MG tablet Take 1 tablet (81 mg total) by mouth daily. Swallow whole.  [name] Kitchen atorvastatin (LIPITOR) 20 MG tablet Take 20 mg by mouth daily.  [name] Kitchen dextromethorphan-guaiFENesin (MUCINEX DM) 30-600 MG 12hr tablet Take 1 tablet by mouth 2 (two) times daily as needed for cough.  . metoprolol tartrate (LOPRESSOR) 25 MG tablet Take 25 mg by mouth 2 (two) times daily.  . predniSONE (DELTASONE) 5 MG tablet Take 1 tablet (5 mg total) by mouth daily with breakfast.  . temazepam (RESTORIL) 30 MG capsule Take 30 mg by mouth at bedtime.     Allergies: Patient has no known allergies.  Social History   Tobacco Use  . Smoking status: Never Smoker  . Smokeless tobacco: Former Systems developer    Types: Secondary school teacher  . Vaping Use: Never used  Substance Use Topics  . Alcohol use: Yes    Alcohol/week: 0.0 standard drinks    Comment: OCCAS-not within the last year as of 09/27/2020  . Drug use: Never    Family History  Problem Relation Age of Onset  . Lung disease Father     Review of Systems: A 12-system review of systems was performed and was negative except as noted in the HPI.  --------------------------------------------------------------------------------------------------  Physical Exam: BP 130/76 (BP Location: Right Arm, Patient Position: Sitting, Cuff Size: Large)   Pulse (!) 59   Ht _0  (1.753 m)   Wt 227 lb (103 kg)   SpO2 97% Comment: on 4L O2  BMI 33.52 kg/m   General: NAD.  Accompanied by his grandson. HEENT: No conjunctival pallor or scleral icterus. Facemask in place. Neck: Supple without lymphadenopathy, thyromegaly, JVD, or HJR. No carotid bruit. Lungs: Normal work of breathing.  Coarse breath sounds throughout without wheezes or crackles. Heart: Regular rate and rhythm without murmurs, rubs, or gallops. Non-displaced PMI. Abd: Bowel sounds present. Soft, NT/ND without  hepatosplenomegaly Ext: No lower extremity edema. Radial, PT, and DP pulses are 2+ bilaterally Skin: Warm and dry without rash. Neuro: CNIII-XII intact. Strength and fine-touch sensation intact in upper and lower extremities bilaterally. Psych: Normal mood and affect.  EKG: Sinus bradycardia with inferior infarct, poor R wave progression (may reflect lead placement), and lateral T wave inversions.  Compared with prior tracing from 06/05/2020, lateral T wave inversions and poor R wave progression are new.  Lab Results  Component Value Date   WBC 9.3 09/27/2020   HGB 14.2 09/27/2020   HCT 41.6 09/27/2020   MCV 102 (H) 09/27/2020   PLT 171 09/27/2020    Lab Results  Component Value Date   NA 144 09/27/2020   K 4.7 09/27/2020   CL 103 09/27/2020   CO2 26 09/27/2020   BUN 7 (L) 09/27/2020   CREATININE 0.77 09/27/2020   GLUCOSE 140 (H) 09/27/2020   ALT 39 09/21/2020    No results found for: CHOL, HDL, LDLCALC, LDLDIRECT, TRIG, CHOLHDL   --------------------------------------------------------------------------------------------------  ASSESSMENT AND PLAN: Shortness of breath, coronary artery disease, pulmonary fibrosis, and chronic respiratory failure with hypoxia: Garrett Peterson has experienced worsening  shortness of breath and oxygen requirement over the last year, most likely driven by his pulmonary fibrosis.  It is notable, however, that his anginal equivalent leading up to CABG in 2010 was shortness of breath.  We have discussed further evaluation options and have agreed to proceed with right and left heart catheterization with possible PCI.  In the meantime, I have encouraged Garrett Peterson to begin taking aspirin 81 mg daily.  Hypertension: Blood pressure borderline elevated today.  We will defer medication changes at this time.  Hyperlipidemia: No recent lipid panel available.  Given history of CAD, we will plan to continue atorvastatin 20 mg daily with plans for target LDL less  than 70.  Shared Decision Making/Informed Consent The risks [stroke (1 in 1000), death (1 in 1000), kidney failure [usually temporary] (1 in 500), bleeding (1 in 200), allergic reaction [possibly serious] (1 in 200)], benefits (diagnostic support and management of coronary artery disease) and alternatives of a cardiac catheterization were discussed in detail with Garrett Peterson and he is willing to proceed.  Follow-up: Return to clinic in 2 to 3 weeks.  Nelva Bush, MD 09/28/2020 7:02 AM

## 2020-09-28 ENCOUNTER — Other Ambulatory Visit: Payer: Self-pay | Admitting: Internal Medicine

## 2020-09-28 ENCOUNTER — Telehealth: Payer: Self-pay | Admitting: Primary Care

## 2020-09-28 ENCOUNTER — Encounter: Payer: Self-pay | Admitting: Internal Medicine

## 2020-09-28 ENCOUNTER — Ambulatory Visit: Payer: Medicare Other | Admitting: Cardiology

## 2020-09-28 DIAGNOSIS — R0602 Shortness of breath: Secondary | ICD-10-CM | POA: Insufficient documentation

## 2020-09-28 DIAGNOSIS — J849 Interstitial pulmonary disease, unspecified: Secondary | ICD-10-CM

## 2020-09-28 DIAGNOSIS — I25119 Atherosclerotic heart disease of native coronary artery with unspecified angina pectoris: Secondary | ICD-10-CM

## 2020-09-28 DIAGNOSIS — J9611 Chronic respiratory failure with hypoxia: Secondary | ICD-10-CM

## 2020-09-28 LAB — BASIC METABOLIC PANEL
BUN/Creatinine Ratio: 9 — ABNORMAL LOW (ref 10–24)
BUN: 7 mg/dL — ABNORMAL LOW (ref 8–27)
CO2: 26 mmol/L (ref 20–29)
Calcium: 9.3 mg/dL (ref 8.6–10.2)
Chloride: 103 mmol/L (ref 96–106)
Creatinine, Ser: 0.77 mg/dL (ref 0.76–1.27)
Glucose: 140 mg/dL — ABNORMAL HIGH (ref 65–99)
Potassium: 4.7 mmol/L (ref 3.5–5.2)
Sodium: 144 mmol/L (ref 134–144)
eGFR: 93 mL/min/{1.73_m2} (ref >59)

## 2020-09-28 LAB — CBC
Hematocrit: 41.6 % (ref 37.5–51.0)
Hemoglobin: 14.2 g/dL (ref 13.0–17.7)
MCH: 34.9 pg — ABNORMAL HIGH (ref 26.6–33.0)
MCHC: 34.1 g/dL (ref 31.5–35.7)
MCV: 102 fL — ABNORMAL HIGH (ref 79–97)
Platelets: 171 10*3/uL (ref 150–450)
RBC: 4.07 x10E6/uL — ABNORMAL LOW (ref 4.14–5.80)
RDW: 12.8 % (ref 11.6–15.4)
WBC: 9.3 10*3/uL (ref 3.4–10.8)

## 2020-09-28 NOTE — Telephone Encounter (Signed)
Dr. Chase Caller this is just an FYI:  Patient has an appointment on 10/03/20 in [place] with Dr. Charlestine Night (cardiology), he is going to have a complete heart catherization, not just the right side.  He stopped the Ofev and his bowl problems have resolved and are back to normal.  Nothing further needed.

## 2020-09-28 NOTE — Progress Notes (Signed)
Daily Session Note  Patient Details  Name: Garrett Peterson MRN: [id number] Date of Birth: [birth year] Referring Provider:   Flowsheet Row Pulmonary Rehab from 08/10/2020 in [facility] Cardiac and Pulmonary Rehab  Referring Provider Brand Males MD      Encounter Date: 09/28/2020  Check In:  Session Check In - 09/28/20 0924      Check-In   Supervising physician immediately available to respond to emergencies See telemetry face sheet for immediately available ER MD    Location [facility]-Cardiac & Pulmonary Rehab    Staff Present Birdie Sons, MPA, Mauricia Area, BS, ACSM CEP, Exercise Physiologist;Jessica Gould, MA, RCEP, CCRP, CCET;Amanda Sommer, BA, ACSM CEP, Exercise Physiologist    Virtual Visit No    Medication changes reported     No    Fall or balance concerns reported    No    Warm-up and Cool-down Performed on first and last piece of equipment    Resistance Training Performed Yes    VAD Patient? No    PAD/SET Patient? No      Pain Assessment   Currently in Pain? No/denies              Social History   Tobacco Use  Smoking Status Never Smoker  Smokeless Tobacco Former Systems developer  . Types: Chew    Goals Met:  Independence with exercise equipment Exercise tolerated well No report of cardiac concerns or symptoms Strength training completed today  Goals Unmet:  Not Applicable  Comments: Pt able to follow exercise prescription today without complaint.  Will continue to monitor for progression.    Dr. Emily Filbert is Medical Director for [facility].

## 2020-09-29 NOTE — Telephone Encounter (Signed)
1. Glad Diarrhea resolved 2. Too bad ofev did not work out. He can drop off unused bottles to [place] at front desk or bring it with him at next visit. So, we can use as donor sample 3. UInfortunately this is the case with some patients - pls let him know 4. Garrett Peterson as allergy 5. Ensure next visit with me - will discuss esbriet  Thanks  MR

## 2020-09-29 NOTE — Telephone Encounter (Signed)
I called and spoke with patient regarding MR recs. Patient verbalized understanding and I have removed Ofev from med list and added it as an allergy in the chart. Patient has an appt with MR on 10/20/20. Patient informed to call with any other questions/answers. Nothing further needed.

## 2020-10-03 ENCOUNTER — Ambulatory Visit
Admission: RE | Admit: 2020-10-03 | Discharge: 2020-10-03 | Disposition: A | Discharge status: Home / Self Care | Payer: Medicare Other | Attending: Internal Medicine | Admitting: Internal Medicine

## 2020-10-03 ENCOUNTER — Other Ambulatory Visit: Payer: Self-pay

## 2020-10-03 ENCOUNTER — Encounter: Payer: Self-pay | Admitting: Internal Medicine

## 2020-10-03 ENCOUNTER — Encounter
Admission: RE | Disposition: A | Discharge status: Home / Self Care | Payer: Medicare Other | Source: Home / Self Care | Attending: Internal Medicine

## 2020-10-03 DIAGNOSIS — Z951 Presence of aortocoronary bypass graft: Secondary | ICD-10-CM | POA: Insufficient documentation

## 2020-10-03 DIAGNOSIS — Z85828 Personal history of other malignant neoplasm of skin: Secondary | ICD-10-CM | POA: Diagnosis not present

## 2020-10-03 DIAGNOSIS — E785 Hyperlipidemia, unspecified: Secondary | ICD-10-CM | POA: Insufficient documentation

## 2020-10-03 DIAGNOSIS — Z7982 Long term (current) use of aspirin: Secondary | ICD-10-CM | POA: Insufficient documentation

## 2020-10-03 DIAGNOSIS — J8410 Pulmonary fibrosis, unspecified: Secondary | ICD-10-CM | POA: Diagnosis not present

## 2020-10-03 DIAGNOSIS — I25119 Atherosclerotic heart disease of native coronary artery with unspecified angina pectoris: Secondary | ICD-10-CM

## 2020-10-03 DIAGNOSIS — I272 Pulmonary hypertension, unspecified: Secondary | ICD-10-CM | POA: Diagnosis not present

## 2020-10-03 DIAGNOSIS — I25810 Atherosclerosis of coronary artery bypass graft(s) without angina pectoris: Secondary | ICD-10-CM | POA: Diagnosis not present

## 2020-10-03 DIAGNOSIS — I2510 Atherosclerotic heart disease of native coronary artery without angina pectoris: Secondary | ICD-10-CM | POA: Insufficient documentation

## 2020-10-03 DIAGNOSIS — I2584 Coronary atherosclerosis due to calcified coronary lesion: Secondary | ICD-10-CM | POA: Insufficient documentation

## 2020-10-03 DIAGNOSIS — Z9049 Acquired absence of other specified parts of digestive tract: Secondary | ICD-10-CM | POA: Insufficient documentation

## 2020-10-03 DIAGNOSIS — Z79899 Other long term (current) drug therapy: Secondary | ICD-10-CM | POA: Diagnosis not present

## 2020-10-03 DIAGNOSIS — Z85048 Personal history of other malignant neoplasm of rectum, rectosigmoid junction, and anus: Secondary | ICD-10-CM | POA: Diagnosis not present

## 2020-10-03 DIAGNOSIS — R0602 Shortness of breath: Secondary | ICD-10-CM

## 2020-10-03 DIAGNOSIS — Z86010 Personal history of colonic polyps: Secondary | ICD-10-CM | POA: Insufficient documentation

## 2020-10-03 DIAGNOSIS — I10 Essential (primary) hypertension: Secondary | ICD-10-CM | POA: Insufficient documentation

## 2020-10-03 DIAGNOSIS — R7303 Prediabetes: Secondary | ICD-10-CM | POA: Diagnosis not present

## 2020-10-03 HISTORY — PX: RIGHT/LEFT HEART CATH AND CORONARY ANGIOGRAPHY: CATH118266

## 2020-10-03 SURGERY — RIGHT/LEFT HEART CATH AND CORONARY ANGIOGRAPHY
Anesthesia: Moderate Sedation

## 2020-10-03 MED ORDER — SODIUM CHLORIDE 0.9% FLUSH: 3.0000 mL | INTRAVENOUS | Status: DC | PRN

## 2020-10-03 MED ORDER — HEPARIN SODIUM (PORCINE) 1000 UNIT/ML IJ SOLN
INTRAMUSCULAR | Status: DC | PRN
  Administered 2020-10-03: 5000 [IU] via INTRAVENOUS

## 2020-10-03 MED ORDER — HEPARIN (PORCINE) IN NACL 2000-0.9 UNIT/L-% IV SOLN
INTRAVENOUS | Status: DC | PRN
  Administered 2020-10-03: 1000 mL

## 2020-10-03 MED ORDER — ASPIRIN 81 MG PO CHEW
81.0000 mg | CHEWABLE_TABLET | ORAL | Status: DC
Start: 2020-10-04 — End: 2020-10-03

## 2020-10-03 MED ORDER — MIDAZOLAM HCL 2 MG/2ML IJ SOLN
INTRAMUSCULAR | Status: DC | PRN
  Administered 2020-10-03: 1 mg via INTRAVENOUS

## 2020-10-03 MED ORDER — MIDAZOLAM HCL 2 MG/2ML IJ SOLN
INTRAMUSCULAR | Status: AC
  Filled 2020-10-03: qty 2

## 2020-10-03 MED ORDER — SODIUM CHLORIDE 0.9 % WEIGHT BASED INFUSION: 3.0000 mL/kg/h | INTRAVENOUS | Status: AC

## 2020-10-03 MED ORDER — LIDOCAINE HCL (PF) 1 % IJ SOLN
INTRAMUSCULAR | Status: AC
  Filled 2020-10-03: qty 30

## 2020-10-03 MED ORDER — SODIUM CHLORIDE 0.9 % IV SOLN: 250.0000 mL | INTRAVENOUS | Status: DC | PRN

## 2020-10-03 MED ORDER — VERAPAMIL HCL 2.5 MG/ML IV SOLN
INTRAVENOUS | Status: DC | PRN
  Administered 2020-10-03: 2.5 mg via INTRAVENOUS

## 2020-10-03 MED ORDER — VERAPAMIL HCL 2.5 MG/ML IV SOLN
INTRAVENOUS | Status: AC
  Filled 2020-10-03: qty 2

## 2020-10-03 MED ORDER — FUROSEMIDE 40 MG PO TABS: 40.0000 mg | ORAL_TABLET | Freq: Every day | ORAL | 5 refills | Status: DC

## 2020-10-03 MED ORDER — SODIUM CHLORIDE 0.9% FLUSH: 3.0000 mL | Freq: Two times a day (BID) | INTRAVENOUS | Status: DC

## 2020-10-03 MED ORDER — IOHEXOL 300 MG/ML  SOLN
INTRAMUSCULAR | Status: DC | PRN
  Administered 2020-10-03: 83 mL

## 2020-10-03 MED ORDER — FENTANYL CITRATE (PF) 100 MCG/2ML IJ SOLN
INTRAMUSCULAR | Status: DC | PRN
  Administered 2020-10-03: 25 ug via INTRAVENOUS

## 2020-10-03 MED ORDER — HEPARIN SODIUM (PORCINE) 1000 UNIT/ML IJ SOLN
INTRAMUSCULAR | Status: AC
  Filled 2020-10-03: qty 1

## 2020-10-03 MED ORDER — FENTANYL CITRATE (PF) 100 MCG/2ML IJ SOLN
INTRAMUSCULAR | Status: AC
  Filled 2020-10-03: qty 2

## 2020-10-03 MED ORDER — HEPARIN (PORCINE) IN NACL 1000-0.9 UT/500ML-% IV SOLN
INTRAVENOUS | Status: AC
  Filled 2020-10-03: qty 1000

## 2020-10-03 MED ORDER — SODIUM CHLORIDE 0.9 % WEIGHT BASED INFUSION
1.0000 mL/kg/h | INTRAVENOUS | Status: DC
  Administered 2020-10-03: 1 mL/kg/h via INTRAVENOUS

## 2020-10-03 SURGICAL SUPPLY — 15 items
CATH INFINITI 5 FR IM (CATHETERS) ×2 IMPLANT
CATH INFINITI 5 FR JL3.5 (CATHETERS) ×2 IMPLANT
CATH INFINITI 5 FR MPA2 (CATHETERS) ×2 IMPLANT
CATH SWAN GANZ 7F STRAIGHT (CATHETERS) ×2 IMPLANT
DEVICE RAD TR BAND REGULAR (VASCULAR PRODUCTS) ×2 IMPLANT
DRAPE BRACHIAL (DRAPES) ×4 IMPLANT
GLIDESHEATH SLEND SS 6F .021 (SHEATH) ×2 IMPLANT
GLIDESHEATH SLENDER 7FR .021G (SHEATH) ×2 IMPLANT
GUIDEWIRE .025 260CM (WIRE) ×2 IMPLANT
KIT SYRINGE INJ CVI SPIKEX1 (MISCELLANEOUS) ×2 IMPLANT
PACK CARDIAC CATH (CUSTOM PROCEDURE TRAY) ×2 IMPLANT
PROTECTION STATION PRESSURIZED (MISCELLANEOUS) ×2
SET ATX SIMPLICITY (MISCELLANEOUS) ×4 IMPLANT
STATION PROTECTION PRESSURIZED (MISCELLANEOUS) ×1 IMPLANT
WIRE ROSEN-J .035X260CM (WIRE) ×2 IMPLANT

## 2020-10-03 NOTE — Interval H&P Note (Signed)
History and Physical Interval Note:  10/03/2020 9:32 AM  Garrett Peterson  has presented today for surgery, with the diagnosis of shortness of breath, pulmonary fibrosis, and coronary artery disease.  The various methods of treatment have been discussed with the patient and family. After consideration of risks, benefits and other options for treatment, the patient has consented to  Procedure(s): RIGHT/LEFT HEART CATH AND CORONARY ANGIOGRAPHY (N/A) as a surgical intervention.  The patient's history has been reviewed, patient examined, no change in status, stable for surgery.  I have reviewed the patient's chart and labs.  Questions were answered to the patient's satisfaction.    Cath Lab Visit (complete for each Cath Lab visit)  Clinical Evaluation Leading to the Procedure:   ACS: No.  Non-ACS:    Anginal Classification: CCS III  Anti-ischemic medical therapy: Minimal Therapy (1 class of medications)  Non-Invasive Test Results: No non-invasive testing performed  Prior CABG: Previous CABG  Garrett Peterson

## 2020-10-05 ENCOUNTER — Ambulatory Visit (INDEPENDENT_AMBULATORY_CARE_PROVIDER_SITE_OTHER): Payer: Medicare Other | Admitting: Dermatology

## 2020-10-05 ENCOUNTER — Other Ambulatory Visit: Payer: Self-pay

## 2020-10-05 ENCOUNTER — Encounter: Payer: Self-pay | Admitting: Dermatology

## 2020-10-05 DIAGNOSIS — L570 Actinic keratosis: Secondary | ICD-10-CM | POA: Diagnosis not present

## 2020-10-05 DIAGNOSIS — L814 Other melanin hyperpigmentation: Secondary | ICD-10-CM

## 2020-10-05 DIAGNOSIS — Z8709 Personal history of other diseases of the respiratory system: Secondary | ICD-10-CM

## 2020-10-05 DIAGNOSIS — D229 Melanocytic nevi, unspecified: Secondary | ICD-10-CM

## 2020-10-05 DIAGNOSIS — L409 Psoriasis, unspecified: Secondary | ICD-10-CM | POA: Diagnosis not present

## 2020-10-05 DIAGNOSIS — D1800 Hemangioma unspecified site: Secondary | ICD-10-CM

## 2020-10-05 DIAGNOSIS — L820 Inflamed seborrheic keratosis: Secondary | ICD-10-CM

## 2020-10-05 DIAGNOSIS — Z85828 Personal history of other malignant neoplasm of skin: Secondary | ICD-10-CM | POA: Diagnosis not present

## 2020-10-05 DIAGNOSIS — Z1283 Encounter for screening for malignant neoplasm of skin: Secondary | ICD-10-CM

## 2020-10-05 DIAGNOSIS — L720 Epidermal cyst: Secondary | ICD-10-CM | POA: Diagnosis not present

## 2020-10-05 DIAGNOSIS — L578 Other skin changes due to chronic exposure to nonionizing radiation: Secondary | ICD-10-CM

## 2020-10-05 DIAGNOSIS — L821 Other seborrheic keratosis: Secondary | ICD-10-CM

## 2020-10-05 MED ORDER — MOMETASONE FUROATE 0.1 % EX CREA: 1.0000 "application " | TOPICAL_CREAM | CUTANEOUS | 0 refills | Status: DC

## 2020-10-05 NOTE — Patient Instructions (Signed)

## 2020-10-05 NOTE — Progress Notes (Signed)
Follow-Up Visit   Subjective  Garrett Peterson is a 75 y.o. male who presents for the following: check spot (R post auricular, > 24yr painful prn), cyst (R lat canthus, ), and Total body skin exam (Hx of BCC R ear, hx of AKs). Patient also has history of psoriasis with rash to the arm.  Patient accompanied by grandson who contributes to history.  The patient presents for Total-Body Skin Exam (TBSE) for skin cancer screening and mole check.  The following portions of the chart were reviewed this encounter and updated as appropriate:   Tobacco  Allergies  Meds  Problems  Med Hx  Surg Hx  Fam Hx     Review of Systems:  No other skin or systemic complaints except as noted in HPI or Assessment and Plan.  Objective  Well appearing patient in no apparent distress; mood and affect are within normal limits.  A full examination was performed including scalp, head, eyes, ears, nose, lips, neck, chest, axillae, abdomen, back, buttocks, bilateral upper extremities, bilateral lower extremities, hands, feet, fingers, toes, fingernails, and toenails. All findings within normal limits unless otherwise noted below.  Objective  Right Ear: Well healed scar with no evidence of recurrence.   Objective  Right lat canthus: Cystic pap  Objective  R post auricular x 1, R ear x 1, R neck x 1, L lat knee x 1 (4): Erythematous keratotic or waxy stuck-on papule or plaque.   Objective  Forehead x 4 (4): Pink scaly macules   Objective  Left Forearm - Anterior: Pink patches L forearm   Assessment & Plan    Lentigines - Scattered tan macules - Due to sun exposure - Benign-appering, observe - Recommend daily broad spectrum sunscreen SPF 30+ to sun-exposed areas, reapply every 2 hours as needed. - Call for any changes  Seborrheic Keratoses - Stuck-on, waxy, tan-brown papules and/or plaques  - Benign-appearing - Discussed benign etiology and prognosis. - Observe - Call for any  changes  Melanocytic Nevi - Tan-brown and/or pink-flesh-colored symmetric macules and papules - Benign appearing on exam today - Observation - Call clinic for new or changing moles - Recommend daily use of broad spectrum spf 30+ sunscreen to sun-exposed areas.   Hemangiomas - Red papules - Discussed benign nature - Observe - Call for any changes  Actinic Damage - Chronic condition, secondary to cumulative UV/sun exposure - diffuse scaly erythematous macules with underlying dyspigmentation - Recommend daily broad spectrum sunscreen SPF 30+ to sun-exposed areas, reapply every 2 hours as needed.  - Staying in the shade or wearing long sleeves, sun glasses (UVA+UVB protection) and wide brim hats (4-inch brim around the entire circumference of the hat) are also recommended for sun protection.  - Call for new or changing lesions.  Skin cancer screening performed today.  Hx of ILD (interstitial lung disease)  - continue f/u with Dr. MBrand Males History of basal cell carcinoma (BCC) Right Ear  Clear. Observe for recurrence. Call clinic for new or changing lesions.  Recommend regular skin exams, daily broad-spectrum spf 30+ sunscreen use, and photoprotection.     Epidermal cyst Right lat canthus  Benign, observe.    Inflamed seborrheic keratosis (4) R post auricular x 1, R ear x 1, R neck x 1, L lat knee x 1  Destruction of lesion - R post auricular x 1, R ear x 1, R neck x 1, L lat knee x 1 Complexity: simple   Destruction method: cryotherapy  Informed consent: discussed and consent obtained   Timeout:  patient name, date of birth, surgical site, and procedure verified Lesion destroyed using liquid nitrogen: Yes   Region frozen until ice ball extended beyond lesion: Yes   Outcome: patient tolerated procedure well with no complications   Post-procedure details: wound care instructions given    AK (actinic keratosis) (4) Forehead x 4  Destruction of lesion - Forehead  x 4 Complexity: simple   Destruction method: cryotherapy   Informed consent: discussed and consent obtained   Timeout:  patient name, date of birth, surgical site, and procedure verified Lesion destroyed using liquid nitrogen: Yes   Region frozen until ice ball extended beyond lesion: Yes   Outcome: patient tolerated procedure well with no complications   Post-procedure details: wound care instructions given    Psoriasis Left Forearm - Anterior  Psoriasis is a chronic non-curable, but treatable genetic/hereditary disease that may have other systemic features affecting other organ systems such as joints (Psoriatic Arthritis). It is associated with an increased risk of inflammatory bowel disease, heart disease, non-alcoholic fatty liver disease, and depression.     Start Mometasone cr qd/bid up to 5 days a week until clear then prn flares  mometasone (ELOCON) 0.1 % cream - Left Forearm - Anterior  Skin cancer screening  Return in about 1 year (around 10/05/2021) for TBSE, Hx of BCC, AK f/u.  I, Othelia Pulling, RMA, am acting as scribe for Sarina Ser, MD .  Documentation: I have reviewed the above documentation for accuracy and completeness, and I agree with the above.  Sarina Ser, MD

## 2020-10-09 ENCOUNTER — Encounter: Payer: Self-pay | Admitting: Dermatology

## 2020-10-10 ENCOUNTER — Other Ambulatory Visit: Payer: Self-pay

## 2020-10-10 ENCOUNTER — Encounter: Payer: Medicare Other | Admitting: *Deleted

## 2020-10-10 DIAGNOSIS — J849 Interstitial pulmonary disease, unspecified: Secondary | ICD-10-CM

## 2020-10-10 NOTE — Progress Notes (Signed)
Daily Session Note  Patient Details  Name: Garrett Peterson MRN: [id number] Date of Birth: 09-22-1945 Referring Provider:   Flowsheet Row Pulmonary Rehab from 08/10/2020 in [facility] Cardiac and Pulmonary Rehab  Referring Provider Brand Males MD      Encounter Date: 10/10/2020  Check In:  Session Check In - 10/10/20 0935      Check-In   Supervising physician immediately available to respond to emergencies See telemetry face sheet for immediately available ER MD    Location [facility]-Cardiac & Pulmonary Rehab    Staff Present Coralie Keens, MS, ASCM CEP, Exercise Physiologist;Cherika Jessie, RN, BSN, CCRP;Melissa Caiola RDN, LDN    Virtual Visit No    Medication changes reported     No    Fall or balance concerns reported    No    Warm-up and Cool-down Performed on first and last piece of equipment    Resistance Training Performed Yes    VAD Patient? No    PAD/SET Patient? No      Pain Assessment   Currently in Pain? No/denies              Social History   Tobacco Use  Smoking Status Never Smoker  Smokeless Tobacco Former Systems developer  . Types: Chew    Goals Met:  Proper associated with RPD/PD & O2 Sat Independence with exercise equipment Exercise tolerated well No report of cardiac concerns or symptoms  Goals Unmet:  Not Applicable  Comments: Pt able to follow exercise prescription today without complaint.  Will continue to monitor for progression.    Dr. Emily Filbert is Medical Director for [facility].  Dr. Ottie Glazier is Medical Director for [facility] Pulmonary Rehabilitation.

## 2020-10-12 ENCOUNTER — Encounter: Payer: Medicare Other | Attending: Internal Medicine | Admitting: *Deleted

## 2020-10-12 ENCOUNTER — Other Ambulatory Visit: Payer: Self-pay

## 2020-10-12 DIAGNOSIS — J9611 Chronic respiratory failure with hypoxia: Secondary | ICD-10-CM | POA: Diagnosis present

## 2020-10-12 DIAGNOSIS — J849 Interstitial pulmonary disease, unspecified: Secondary | ICD-10-CM | POA: Diagnosis present

## 2020-10-12 NOTE — Progress Notes (Signed)
Daily Session Note  Patient Details  Name: Garrett Peterson MRN: [id number] Date of Birth: Apr 21, 1946 Referring Provider:   Flowsheet Row Pulmonary Rehab from 08/10/2020 in [facility] Cardiac and Pulmonary Rehab  Referring Provider Garrett Males MD      Encounter Date: 10/12/2020  Check In:  Session Check In - 10/12/20 1145      Check-In   Supervising physician immediately available to respond to emergencies See telemetry face sheet for immediately available ER MD    Location [facility]-Cardiac & Pulmonary Rehab    Staff Present Heath Lark, RN, BSN, Laveda Norman, BS, ACSM CEP, Exercise Physiologist;Amanda Oletta Darter, [place], ACSM CEP, Exercise Physiologist    Virtual Visit No    Medication changes reported     No    Fall or balance concerns reported    No    Warm-up and Cool-down Performed on first and last piece of equipment    Resistance Training Performed Yes    VAD Patient? No    PAD/SET Patient? No      Pain Assessment   Currently in Pain? No/denies              Social History   Tobacco Use  Smoking Status Never Smoker  Smokeless Tobacco Former Systems developer  . Types: Chew    Goals Met:  Proper associated with RPD/PD & O2 Sat Independence with exercise equipment Exercise tolerated well No report of cardiac concerns or symptoms  Goals Unmet:  Not Applicable  Comments: Pt able to follow exercise prescription today without complaint.  Will continue to monitor for progression.    Dr. Emily Filbert is Medical Director for [name].  Dr. Ottie Glazier is Medical Director for [name] Pulmonary Rehabilitation.

## 2020-10-13 ENCOUNTER — Encounter: Payer: Self-pay | Admitting: Nurse Practitioner

## 2020-10-13 ENCOUNTER — Other Ambulatory Visit: Payer: Self-pay

## 2020-10-13 ENCOUNTER — Ambulatory Visit (INDEPENDENT_AMBULATORY_CARE_PROVIDER_SITE_OTHER): Payer: Medicare Other | Admitting: Nurse Practitioner

## 2020-10-13 VITALS — BP 150/70 | HR 60 | Ht 69.0 in | Wt 223.2 lb

## 2020-10-13 DIAGNOSIS — I5032 Chronic diastolic (congestive) heart failure: Secondary | ICD-10-CM

## 2020-10-13 DIAGNOSIS — E785 Hyperlipidemia, unspecified: Secondary | ICD-10-CM | POA: Diagnosis not present

## 2020-10-13 DIAGNOSIS — I25119 Atherosclerotic heart disease of native coronary artery with unspecified angina pectoris: Secondary | ICD-10-CM | POA: Diagnosis not present

## 2020-10-13 DIAGNOSIS — I10 Essential (primary) hypertension: Secondary | ICD-10-CM | POA: Diagnosis not present

## 2020-10-13 DIAGNOSIS — I2510 Atherosclerotic heart disease of native coronary artery without angina pectoris: Secondary | ICD-10-CM

## 2020-10-13 NOTE — Progress Notes (Signed)
Office Visit    Patient Name: Garrett Peterson Date of Encounter: 10/13/2020  Primary Care Provider:  Albina Billet, MD Primary Cardiologist:  Nelva Bush, MD  Chief Complaint    75 year old male with a history of CAD status post CABG x4 in 2010, hypertension, hyperlipidemia, interstitial lung disease, and rectal cancer, who presents for follow-up after recent diagnostic catheterization.  Past Medical History    Past Medical History:  Diagnosis Date  . (HFpEF) heart failure with preserved ejection fraction ([place])    a. 05/2020 Echo: EF 55-60%, no rwma, Gr1 DD, mildly red RV fxn; b. 09/2020 RHC: PCWP 71mHg. CO/CI 4.7/2.1.  . Actinic keratosis   . Adenomatous polyp of colon   . Arthritis   . Basal cell carcinoma    R ear  . CAD (coronary artery disease)    a. 2010 s/p CABG x 3 (VG->OM2, VG->RPDA, LIMA->LAD); b. 09/2020 Cath: LM 70d, LAD 70ost, 80/719mLCX 100ost/p, 99p/m, RCA 100ost/p, VG->OM2 4079mIMA->LAD nl, VG->RPDA nl-->Med Rx.  . Cancer ([facility]   rectal  . Hyperlipidemia   . Hypertension   . Interstitial lung disease ([place] . Pneumonia   . Pre-diabetes   . [facility]  Past Surgical History:  Procedure Laterality Date  . APPENDECTOMY    . BRONCHIAL WASHINGS  05/18/2020   Procedure: BRONCHIAL WASHINGS;  Surgeon: EllMargaretha SeedsD;  [facility];  Service: Pulmonary;;  . CARDIAC CATHETERIZATION     PT DENIES  . COLONOSCOPY     2011,2015,2021  . COLONOSCOPY WITH PROPOFOL N/A 07/10/2020   Procedure: COLONOSCOPY WITH PROPOFOL;  Surgeon: Toledo, TeoBenay PikeD;  Location: [facility] ENDOSCOPY;  Service: Gastroenterology;  Laterality: N/A;  . CORONARY ARTERY BYPASS GRAFT    . FLEXIBLE BRONCHOSCOPY N/A 04/24/2020   Procedure: FLEXIBLE BRONCHOSCOPY;  Surgeon: AleOttie GlazierD;  Location: [facility] ORS;  Service: Thoracic;  Laterality: N/A;  . IR SINUS/FIST TUBE CHK-NON GI  10/27/2019  . open heart surgery    . PORTACATH PLACEMENT  Right 10/13/2019   Procedure: INSERTION PORT-A-CATH;  Surgeon: CinHerbert PunD;  Location: [facility] ORS;  Service: General;  Laterality: Right;  . RIGHT/LEFT HEART CATH AND CORONARY ANGIOGRAPHY N/A 10/03/2020   Procedure: RIGHT/LEFT HEART CATH AND CORONARY ANGIOGRAPHY;  Surgeon: EndNelva BushD;  [facility];  Service: Cardiovascular;  Laterality: N/A;  . ROBOT ASSISTED LAPAROSCOPIC PARTIAL COLECTOMY  09/2019  . VIDEO BRONCHOSCOPY N/A 05/18/2020   Procedure: VIDEO BRONCHOSCOPY WITHOUT FLUORO;  Surgeon: EllMargaretha SeedsD;  [facility];  Laterality: N/A;    Allergies  Allergies  Allergen Reactions  . Ofev [Nintedanib] Diarrhea and Nausea Only    History of Present Illness    [age]ar old male with above past medical history including coronary disease status post CABG x4 in 2010, hypertension, hyperlipidemia, and rectal cancer.  Over the past year, he has had progressive dyspnea on exertion symptoms initially felt to be secondary to prior COVID infection.  Further work-up including CT chest, has shown interstitial lung disease.  He was evaluated in May 2022 by Dr. EndSaunders Revelth ongoing complaints of dyspnea and fatigue, which he identified his anginal equivalents.  This setting, decision made to pursue diagnostic catheterization.  Cath was performed May 24 revealing severe, native multivessel disease with 3 of 3 patent grafts.  Right heart catheterization revealed elevated right heart pressures including a PA of 60/23 (35), and wedge of 23  mmHg.  Lasix 40 mg daily was initiated for management of HFpEF with plan for early outpatient follow-up and lab work today.  Since his diagnostic catheterization, his breathing has been stable.  He has not seen a significant difference in his activity tolerance since starting Lasix.  He is down about 4 pounds since his last visit and has noted improvement in lower extremity swelling.  He denies chest pain,  palpitations, PND, orthopnea, dizziness, syncope, or early satiety.  Home Medications    Prior to Admission medications   Medication Sig Start Date End Date Taking? Authorizing Provider  aspirin EC 81 MG tablet Take 1 tablet (81 mg total) by mouth daily. Swallow whole. 09/27/20  Yes End, Harrell Gave, MD  atorvastatin (LIPITOR) 20 MG tablet Take 20 mg by mouth daily.   Yes [provider]  dextromethorphan-guaiFENesin (MUCINEX DM) 30-600 MG 12hr tablet Take 1 tablet by mouth 2 (two) times daily as needed for cough. 03/04/20  Yes Pokhrel, Laxman, MD  furosemide (LASIX) 40 MG tablet Take 1 tablet (40 mg total) by mouth daily. 10/03/20 10/03/21 Yes End, Harrell Gave, MD  metoprolol tartrate (LOPRESSOR) 25 MG tablet Take 25 mg by mouth 2 (two) times daily. 11/03/19  Yes [provider]  mometasone (ELOCON) 0.1 % cream Apply 1 application topically as directed. Qd to bid up to 5 days a week to aa psoriasis on arms until clear, then prn flares 10/05/20  Yes Ralene Bathe, MD  predniSONE (DELTASONE) 5 MG tablet Take 1 tablet (5 mg total) by mouth daily with breakfast. 09/21/20  Yes Martyn Ehrich, NP  temazepam (RESTORIL) 30 MG capsule Take 30 mg by mouth at bedtime.  06/09/14  Yes [provider]    Review of Systems    Chronic dyspnea on exertion, which is currently stable.  He has had some improvement in ankle edema.  He denies chest pain, palpitation, PND, orthopnea, dizziness, syncope, or early satiety..  All other systems reviewed and are otherwise negative except as noted above.  Physical Exam    VS:  BP (!) 150/70 (BP Location: Left Arm, Patient Position: Sitting, Cuff Size: Normal)   Pulse 60   Ht 5' 9" (1.753 m)   Wt 223 lb 4 oz (101.3 kg)   SpO2 98%   BMI 32.97 kg/m  , BMI Body mass index is 32.97 kg/m. GEN: Well nourished, well developed, in no acute distress. HEENT: normal. Neck: Supple, no JVD, carotid bruits, or masses. Cardiac: RRR, no murmurs,  rubs, or gallops. No clubbing, cyanosis, trace left malleolar edema.  Radials/DP/PT 2+ and equal bilaterally.  Respiratory:  Respirations regular and unlabored, bibasilar crackles. GI: Soft, nontender, nondistended, BS + x 4. MS: no deformity or atrophy. Skin: warm and dry, no rash. Neuro:  Strength and sensation are intact. Psych: Normal affect.  Accessory Clinical Findings    ECG personally reviewed by me today -regular sinus rhythm, 60, nonspecific ST and T changes- no acute changes.  Lab Results  Component Value Date   WBC 9.3 09/27/2020   HGB 14.2 09/27/2020   HCT 41.6 09/27/2020   MCV 102 (H) 09/27/2020   PLT 171 09/27/2020   Lab Results  Component Value Date   CREATININE 0.77 09/27/2020   BUN 7 (L) 09/27/2020   NA 144 09/27/2020   K 4.7 09/27/2020   CL 103 09/27/2020   CO2 26 09/27/2020   Lab Results  Component Value Date   ALT 39 09/21/2020   AST 34 09/21/2020  ALKPHOS 97 09/21/2020   BILITOT 0.5 09/21/2020    Assessment & Plan    1.  Coronary artery disease: Status post prior CABG in 2010 with recent catheterization revealing severe, native multivessel disease and 3 of 3 patent grafts.  He was noted to have elevated filling pressures and Lasix was added.  He has not been having any chest pain.  No change in chronic dyspnea with the addition of Lasix.  He remains on aspirin, statin, and beta-blocker therapy.  His blood pressure is elevated today-see below.  2.  Chronic heart failure with preserved ejection fraction: EF 55 to 60% by echo earlier this year.  Right heart catheterization revealed elevated filling pressures prompting initiation of Lasix 40 mg daily.  Patient's weight is down 4 pounds since his last visit and he has noted almost complete resolution of lower extremity edema.  He has not noticed any significant change in his breathing as of yet.  I will follow-up a basic metabolic panel today.  Heart rate stable though his blood pressure is elevated today.   He notes that this is unusual for him and will check his blood pressure daily over the next few weeks to establish a trend.  Would have a low threshold to add spironolactone.  3.  Essential hypertension: As above, pressure elevated today.  He notes that this is unusual for him and plans to check pressures at home.  Low threshold to add spironolactone in the setting of HFpEF.  4.  Hyperlipidemia: Remains on statin therapy with normal LFTs in May.  Lipids previous followed by primary care.  I do not see anything on file here or in Care Everywhere.  Will need follow-up at some point this year.  5.  Chronic hypoxic respiratory failure on home O2/interstitial lung disease: Followed by pulmonology.  6.  Disposition: Follow-up basic metabolic panel today.  Follow-up in clinic in approximate 4 to 6 weeks.  [name], NP 10/13/2020, 1:13 PM

## 2020-10-13 NOTE — Patient Instructions (Addendum)
Medication Instructions:  No changes at this time.  *If you need a refill on your cardiac medications before your next appointment, please call your pharmacy*   Lab Work: None  If you have labs (blood work) drawn today and your tests are completely normal, you will receive your results only by: [name] Kitchen MyChart Message (if you have MyChart) OR . A paper copy in the mail If you have any lab test that is abnormal or we need to change your treatment, we will call you to review the results.   Testing/Procedures: None   Follow-Up: At [facility], you and your health needs are our priority.  As part of our continuing mission to provide you with exceptional heart care, we have created designated Provider Care Teams.  These Care Teams include your primary Cardiologist (physician) and Advanced Practice Providers (APPs -  Physician Assistants and Nurse Practitioners) who all work together to provide you with the care you need, when you need it.  We recommend signing up for the patient portal called "MyChart".  Sign up information is provided on this After Visit Summary.  MyChart is used to connect with patients for Virtual Visits (Telemedicine).  Patients are able to view lab/test results, encounter notes, upcoming appointments, etc.  Non-urgent messages can be sent to your provider as well.   To learn more about what you can do with MyChart, go to [email or web address].    Your next appointment:   4-6 week(s)  The format for your next appointment:   In Person  Provider:   Nelva Bush, MD or [name], NP    Please monitor blood pressures and keep a log of your readings.   Make sure to check 2 hours after your medications.   AVOID these things for 30 minutes before checking your blood pressure:  No Drinking caffeine.  No Drinking alcohol.  No Eating.  No Smoking.  No Exercising.  Five minutes before checking your blood pressure:  Pee.  Sit in a dining chair.  Avoid sitting in a soft couch or armchair.  Be quiet. Do not talk.  How to Take Your Blood Pressure Blood pressure measures how strongly your blood is pressing against the walls of your arteries. Arteries are blood vessels that carry blood from your heart throughout your body. You can take your blood pressure at home with a machine. You may need to check your blood pressure at home:  To check if you have high blood pressure (hypertension).  To check your blood pressure over time.  To make sure your blood pressure medicine is working. Supplies needed:  Blood pressure machine, or monitor.  Dining room chair to sit in.  Table or desk.  Small notebook.  Pencil or pen. How to prepare Avoid these things for 30 minutes before checking your blood pressure:  Having drinks with caffeine in them, such as coffee or tea.  Drinking alcohol.  Eating.  Smoking.  Exercising. Do these things five minutes before checking your blood pressure:  Go to the bathroom and pee (urinate).  Sit in a dining chair. Do not sit in a soft couch or an armchair.  Be quiet. Do not talk. How to take your blood pressure Follow the instructions that came with your machine. If you have a digital blood pressure monitor, these may be the instructions: 1. Sit up straight. 2. Place your feet on the floor. Do not cross your ankles or legs. 3. Rest your left arm at the level of your heart.  You may rest it on a table, desk, or chair. 4. Pull up your shirt sleeve. 5. Wrap the blood pressure cuff around the upper part of your left arm. The cuff should be 1 inch (2.5 cm) above your elbow. It is best to wrap the cuff around bare skin. 6. Fit the cuff snugly around your arm. You should be able to place only one finger between the cuff and your arm. 7. Place the cord so that it rests in the bend of your elbow. 8. Press the power button. 9. Sit quietly while the cuff fills with air and loses air. 10. Write down the  numbers on the screen. 11. Wait 2-3 minutes and then repeat steps 1-10.   What do the numbers mean? Two numbers make up your blood pressure. The first number is called systolic pressure. The second is called diastolic pressure. An example of a blood pressure reading is "120 over 80" (or 120/80). If you are an adult and do not have a medical condition, use this guide to find out if your blood pressure is normal: Normal  First number: below 120.  Second number: below 80. Elevated  First number: 120-129.  Second number: below 80. Hypertension stage 1  First number: 130-139.  Second number: 80-89. Hypertension stage 2  First number: 140 or above.  Second number: 10 or above. Your blood pressure is above normal even if only the top or bottom number is above normal. Follow these instructions at home:  Check your blood pressure as often as your doctor tells you to.  Check your blood pressure at the same time every day.  Take your monitor to your next doctor's appointment. Your doctor will: ? Make sure you are using it correctly. ? Make sure it is working right.  Make sure you understand what your blood pressure numbers should be.  Tell your doctor if your medicine is causing side effects.  Keep all follow-up visits as told by your doctor. This is important. General tips:  You will need a blood pressure machine, or monitor. Your doctor can suggest a monitor. You can buy one at a drugstore or online. When choosing one: ? Choose one with an arm cuff. ? Choose one that wraps around your upper arm. Only one finger should fit between your arm and the cuff. ? Do not choose one that measures your blood pressure from your wrist or finger. Where to find more information American Heart Association: www.heart.org Contact a doctor if:  Your blood pressure keeps being high. Get help right away if:  Your first blood pressure number is higher than 180.  Your second blood pressure  number is higher than 120. Summary  Check your blood pressure at the same time every day.  Avoid caffeine, alcohol, smoking, and exercise for 30 minutes before checking your blood pressure.  Make sure you understand what your blood pressure numbers should be. This information is not intended to replace advice given to you by your health care provider. Make sure you discuss any questions you have with your health care provider. Document Revised: 04/23/2019 Document Reviewed: 04/23/2019 Elsevier Patient Education  2021 [employer].

## 2020-10-14 ENCOUNTER — Encounter: Payer: Self-pay | Admitting: Oncology

## 2020-10-14 LAB — BASIC METABOLIC PANEL
BUN/Creatinine Ratio: 9 — ABNORMAL LOW (ref 10–24)
BUN: 8 mg/dL (ref 8–27)
CO2: 27 mmol/L (ref 20–29)
Calcium: 9.4 mg/dL (ref 8.6–10.2)
Chloride: 99 mmol/L (ref 96–106)
Creatinine, Ser: 0.93 mg/dL (ref 0.76–1.27)
Glucose: 160 mg/dL — ABNORMAL HIGH (ref 65–99)
Potassium: 3.7 mmol/L (ref 3.5–5.2)
Sodium: 143 mmol/L (ref 134–144)
eGFR: 85 mL/min/{1.73_m2} (ref >59)

## 2020-10-17 ENCOUNTER — Other Ambulatory Visit: Payer: Self-pay

## 2020-10-17 DIAGNOSIS — J849 Interstitial pulmonary disease, unspecified: Secondary | ICD-10-CM

## 2020-10-17 NOTE — Progress Notes (Signed)
Daily Session Note  Patient Details  Name: Garrett Peterson MRN: [id number] Date of Birth: 11/03/1945 Referring Provider:   Flowsheet Row Pulmonary Rehab from 08/10/2020 in [facility]  Referring Provider Ramaswamy, Murali MD      Encounter Date: 10/17/2020  Check In:  Session Check In - 10/17/20 0924      Check-In   Supervising physician immediately available to respond to emergencies See telemetry face sheet for immediately available ER MD    Location [facility]    Staff Present Kelly Bollinger, MPA, RN;Amanda Sommer, BA, ACSM CEP, Exercise Physiologist;Kara Langdon, MS, ASCM CEP, Exercise Physiologist    Virtual Visit No    Medication changes reported     No    Fall or balance concerns reported    No    Warm-up and Cool-down Performed on first and last piece of equipment    Resistance Training Performed Yes    VAD Patient? No    PAD/SET Patient? No      Pain Assessment   Currently in Pain? No/denies              Social History   Tobacco Use  Smoking Status Never Smoker  Smokeless Tobacco Former User  . Types: Chew    Goals Met:  Independence with exercise equipment Exercise tolerated well Personal goals reviewed No report of cardiac concerns or symptoms Strength training completed today  Goals Unmet:  Not Applicable  Comments: Pt able to follow exercise prescription today without complaint.  Will continue to monitor for progression. Reviewed home exercise with pt today.  Pt plans to use bike at home and join the [employer] for exercise.  Reviewed THR, pulse, RPE, sign and symptoms, pulse oximetery and when to call 911 or MD.  Also discussed weather considerations and indoor options.  Pt voiced understanding.    Dr. Mark Miller is Medical Director for [facility].  Dr. Fuad Aleskerov is Medical Director for [facility]. 

## 2020-10-18 ENCOUNTER — Ambulatory Visit (INDEPENDENT_AMBULATORY_CARE_PROVIDER_SITE_OTHER): Payer: Medicare Other | Admitting: Internal Medicine

## 2020-10-18 ENCOUNTER — Other Ambulatory Visit: Payer: Self-pay

## 2020-10-18 DIAGNOSIS — I2723 Pulmonary hypertension due to lung diseases and hypoxia: Secondary | ICD-10-CM | POA: Diagnosis not present

## 2020-10-18 DIAGNOSIS — J9611 Chronic respiratory failure with hypoxia: Secondary | ICD-10-CM | POA: Diagnosis not present

## 2020-10-18 DIAGNOSIS — J84112 Idiopathic pulmonary fibrosis: Secondary | ICD-10-CM

## 2020-10-18 NOTE — Progress Notes (Signed)
_0  ID: Garrett Peterson, male    DOB: 29-Apr-1946, 75 y.o.   MRN: [id number]  Chief Complaint  Patient presents with  . Follow-up    Doing ok, winded with activities    Referring provider: Albina Billet, MD  HPI:   Previous [facility] pulmonary encounter: 07/04/20- Dr. Chase Caller  Referred to the ILD center for comprehensive evaluation by Dr. Rodman Pickle primary pulmonologist 75 year old male, never smoked.  Significant for chronic hypoxic respiratory failure, coronary artery disease, hypertension.  Patient of Dr. Chase Caller. Started OFEV in April 2022.   Garrett Peterson 75 y.o. -very complicated story.  Details of the story is copied and pasted above.  Meeting him for the first time with his wife.  It appears that he was previously healthy as of April 2021.  Although CT scan of the chest at the time of my personal visualization showed presence of early ILD [CT abdomen 10 years prior in 2011 had no ILD].  He was diagnosed with anorectal cancer.  He was undergoing treatment at [facility].  Chemotherapy as above.  Then in the fall/winter 2020 when he started falling ill with respiratory illnesses.  This is all documented above.  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  CT scan of the chest by October 2021 started showing significant amount of groundglass opacities.  He only had ANA test, rheumatoid factor and angiotensin-converting enzyme.  This is all negative.  For serology.  Otherwise has not had any other serology.  He had hypersensitive pneumonitis panel this was negative.  This was at outside facility [facility] clinic pulmonology program.  He has had 2 hospitalizations the most recent one being in January.  It appears that he was treated with prednisone each time.  It is unclear to me as this to him and his wife if he has been on chronic prednisone but reading the notes it appears so.  Currently is just finished a taper starting at 60 mg/day in January and finished at 20 mg and stop.  He says  since stopping yesterday or today he is beginning to feel a little bit worse.  Room air oxygen at rest was fine but when he desaturated after he walked more than 2 laps.  However he needed 5 L to correct.  Most recent echocardiogram was normal.  Most recent lavage showed mostly neutrophils.  Again culture negative.  His most recent CT scan of the chest in February 2022 without contrast shows improvement in groundglass opacities but no fibrosis.  His serologies ANA negative and rheumatoid factor negative and angiotensin converting enzyme negative  Definitely earlier on in the year it was not consistent with UIP and suggested alternate diagnosis.  Unclear to me if he has probable UIP pattern at this point in time in February 2022.  We did not have time to do an extensive detailed ILD questionnaire.  It appears that his goals are to get better and also feel better at the same time control his cancer.  They are also wondering about prognosis.  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  He is currently looking into getting radiation locally.  Oncologist wants to do a colonoscopy.  She feels the recurrence is because all his chemo has been on hold because of respiratory issues.   SErology Dec 2021 - [place]  ANA  Direct - [facility] Negative Negative   RA Latex Turbid. - [facility] 0.0 - 13.9 IU/mL <10.0   A.Fumigatus #1 Abs - [facility] Negative Negative  Micropolyspora faeni, IgG - [facility] Negative Negative  Thermoactinomyces vulgaris, IgG - [facility] Negative Negative  A. Pullulans Abs - [facility] Negative Negative  Thermoact. Saccharii - [facility] Negative Negative  Pigeon Serum Abs - [facility] Negative Negative   Angio Convert Enzyme - [facility] 14 - 82 U/L 26   Walk tst 07/04/2020    94% - ra at rest -> walked 2 laps without desat and dropped to  86% in middle of 3rd lap. Then need 5L [place] to correct to walk 3 laps in office   CT chest 06/23/20   IMPRESSION: 1. 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 Fibrosis has progressed since 03/02/2020 chest CT with resolved consolidative opacities. 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 UIP is difficult to exclude but is less favored. Follow-up high-resolution chest CT suggested in 6-12 months. Findings are indeterminate for UIP per consensus guidelines: Diagnosis of Idiopathic Pulmonary Fibrosis: An Official ATS/ERS/JRS/ALAT Clinical Practice Guideline. [place], Iss 5, [phone], Jan 11 2017. 2. Stable mild cardiomegaly. 3. Aortic Atherosclerosis (ICD10-I70.0).   Electronically Signed   By: [name] M.D.   On: 06/24/2020 15:15     08/02/2020 Patient presents today for 1 month follow-up with spirometry/DLCO. Patient was discussed at ILD conference in March 2022, diagnosed with IPF. Recent exacerbation likely chemo related. Started on Ofev. Patient was referred for right heart cath and referred to pulmonary rehab.  He is currently on an extended prednisone taper. He started 40m prednisone dose today x 2 weeks. He is still taking Bactrim. Breathing has improved. He has not started antifibrotic medication yet, his wifes states that their insurance has improved OFEV medication.   PFT 08/02/2020 - FVC 1.61 (41%), FEV1 1.55 (55%), ratio 96, DLCOcor 12.27 (52%) 09/21/2020 Patient presents today for 1 month follow-up. Accompanied by his son. He stop pulmonary for a week or two since having bronchitis. Cough is better since taking mucinex. He is on 4L nasal cannula. He  is having 6-7 loose stools a day. Diarrhea started when he went up to 2 tabs of Ofev. He has up coming hearth cath, date to be determined. Son reports some new leg swelling. Due for LFTs today.   HEart cth 10/03/20  Conclusions: 1. Severe native coronary artery disease including 70% distal LMCA and mid LAD disease, sequential 100% ostial and 99% proximal LCx lesions, and chronic total occlusion of ostial RCA. 2. Widely patent LIMA-LAD and SVG-rPDA. 3. Patent SVG-OM2 with 40% stenosis in proximal/mid portion of SVG. 4. Mildly elevated left heart filling pressure (LVEDP 20 mmHg, PCWP 23 mmHg). 5. Moderately elevated right heart filling pressure (mean RAP 14 mmHg, LVEDP 20 mmHg). 6. Moderate pulmonary hypertension (mean PAP 35 mmHg, PVR 2.6 WU). 7. Mildly reduced cardiac output/index.  Recommendations: 1. Aggressive secondary prevention of coronary artery disease. 2. Initiate furosemide 40 mg PO daily for HFpEF.  BMP to be drawn when patient is seen for follow-up 10/13/2020. 3. Ongoing management of pulmonary fibrosis and pulmonary hypertension per Dr. RChase Caller  CNelva Bush MD [facility]     OV 10/18/2020  Subjective:  Patient ID: PJuliette Peterson male ,  DOB: 1946-03-24 , age 28 y.o. , MRN: [id number] , ADDRESS: [place] [address] PCP Albina Billet, MD Patient Care Team: Albina Billet, MD as PCP - General (Internal Medicine) End, Harrell Gave, MD as PCP - Cardiology (Cardiology) Clent Jacks, RN as Oncology Nurse Navigator Earlie Server, MD as Consulting Physician (Oncology)  This Provider for this visit: Treatment Team:  Attending Provider: Brand Males, MD  Type of visit: Telephone/Video Circumstance: COVID-19 national emergency Identification of patient ALESANDRO STUEVE with [birth year] and MRN [id number] - 2 person identifier Risks: Risks, benefits, limitations of telephone visit explained. Patient understood and verbalized agreement to  proceed Anyone else on call: just patient Patient location: [phone] This provider location: Provider home because provider is isolating due to covid   10/18/2020 -  followup IPF.   HPI HADEN SUDER 76 y.o. - Off ofev since 09/21/20 due to diarrrhea. He called 09/28/20 and we told him to stop ofev.  But now he tells me 10/18/2020 that for 3 weeks he is taking 120m bid. No diarrhea other than going to BR frequently but is tolerabl and small amounts only. Respiration wise he is stable. Starts getting worse after lunch. Still on 4 LNC o2. No change Not on active cancer Rx. Has been cleared. Had RHC- see ablve. Srated on new lasix and helped breathing somewhat. Is attending pulm rehab  Discussed Include in patients with ILD   - Right heart cath :  PVR > 3, PCWP </= 15, Pmap >/=  25 -  Patient needed to be able to walk 1081m 300 feet on a 6m88mlk test  - he does have exclusionary criteria - his PVR is < 3 and PCWP is  15 but he is interested in applying for tyvasos. Discussed side effect profile briefly     CT Chest data  No results found.    SYMPTOM SCALE - ILD 07/04/2020  08/02/2020   O2 use 4L  4L  Shortness of Breath 0 -> 5 scale with 5 being worst (score 6 If unable to do)   At rest 1 0  Simple tasks - showers, clothes change, eating, shaving 4 3  Household (dishes, doing bed, laundry) x x  Shopping x x  Walking level at own pace 3 3  Walking up Stairs 4 4  Total (30-36) Dyspnea Score 12 10  How bad is your cough? 0 0  How bad is your fatigue 4 3  How bad is nausea 0 0  How bad is vomiting?  0 0  How bad is diarrhea? 0 0  How bad is anxiety? 5 3  How bad is depression 5 3     PFT  PFT Results Latest Ref Rng & Units 08/02/2020  FVC-Pre L 1.61  FVC-Predicted Pre % 41  Pre FEV1/FVC % % 96  FEV1-Pre L 1.55  FEV1-Predicted Pre % 55  DLCO uncorrected ml/min/mmHg 12.41  DLCO UNC% % 52  DLCO corrected ml/min/mmHg 12.27  DLCO COR %Predicted % 52  DLVA Predicted %  103       has a past medical history of (HFpEF) heart failure with preserved ejection fraction (HCCRanc[facility] EstatesActinic keratosis, Adenomatous polyp of colon, Arthritis, Basal cell carcinoma, CAD (coronary artery disease), Cancer (HCCRenova2021), Hyperlipidemia, Hypertension, Interstitial lung disease (HCCWintersvillePneumonia, Pre-diabetes, and Respiratory failure with hypoxia ([place]CCClacks [place]  reports that he has never smoked. He has quit using smokeless tobacco.  His smokeless tobacco use included  chew.  Past Surgical History:  Procedure Laterality Date  . APPENDECTOMY    . BRONCHIAL WASHINGS  05/18/2020   Procedure: BRONCHIAL WASHINGS;  Surgeon: Margaretha Seeds, MD;  Location[facility]COPY;  Service: Pulmonary;;  . CARDIAC CATHETERIZATION     PT DENIES  . COLONOSCOPY     2011,2015,2021  . COLONOSCOPY WITH PROPOFOL N/A 07/10/2020   Procedure: COLONOSCOPY WITH PROPOFOL;  Surgeon: Toledo, Benay Pike, MD;  Location: [facility] ENDOSCOPY;  Service: Gastroenterology;  Laterality: N/A;  . CORONARY ARTERY BYPASS GRAFT    . FLEXIBLE BRONCHOSCOPY N/A 04/24/2020   Procedure: FLEXIBLE BRONCHOSCOPY;  Surgeon: Ottie Glazier, MD;  Location: [facility] ORS;  Service: Thoracic;  Laterality: N/A;  . IR SINUS/FIST TUBE CHK-NON GI  10/27/2019  . open heart surgery    . PORTACATH PLACEMENT Right 10/13/2019   Procedure: INSERTION PORT-A-CATH;  Surgeon: Herbert Pun, MD;  Location: [facility] ORS;  Service: General;  Laterality: Right;  . RIGHT/LEFT HEART CATH AND CORONARY ANGIOGRAPHY N/A 10/03/2020   Procedure: RIGHT/LEFT HEART CATH AND CORONARY ANGIOGRAPHY;  Surgeon: Nelva Bush, MD;  Location: [place] CV LAB;  Service: Cardiovascular;  Laterality: N/A;  . ROBOT ASSISTED LAPAROSCOPIC PARTIAL COLECTOMY  09/2019  . VIDEO BRONCHOSCOPY N/A 05/18/2020   Procedure: VIDEO BRONCHOSCOPY WITHOUT FLUORO;  Surgeon: Margaretha Seeds, MD;  Location: [place];  Service: Pulmonary;  Laterality: N/A;    Allergies  Allergen Reactions  . Ofev  [Nintedanib] Diarrhea and Nausea Only    Immunization History  Administered Date(s) Administered  . PFIZER(Purple Top)SARS-COV-2 Vaccination 01/18/2020  . Zoster Recombinat (Shingrix) 01/12/2018, 03/21/2018    Family History  Problem Relation Age of Onset  . Lung disease Father      Current Outpatient Medications:  .  aspirin EC 81 MG tablet, Take 1 tablet (81 mg total) by mouth daily. Swallow whole., Disp: , Rfl:  .  atorvastatin (LIPITOR) 20 MG tablet, Take 20 mg by mouth daily., Disp: , Rfl:  .  dextromethorphan-guaiFENesin (MUCINEX DM) 30-600 MG 12hr tablet, Take 1 tablet by mouth 2 (two) times daily as needed for cough., Disp: 60 tablet, Rfl: 0 .  furosemide (LASIX) 40 MG tablet, Take 1 tablet (40 mg total) by mouth daily., Disp: 30 tablet, Rfl: 5 .  metoprolol tartrate (LOPRESSOR) 25 MG tablet, Take 25 mg by mouth 2 (two) times daily., Disp: , Rfl:  .  mometasone (ELOCON) 0.1 % cream, Apply 1 application topically as directed. Qd to bid up to 5 days a week to aa psoriasis on arms until clear, then prn flares, Disp: 45 g, Rfl: 0 .  predniSONE (DELTASONE) 5 MG tablet, Take 1 tablet (5 mg total) by mouth daily with breakfast., Disp: 30 tablet, Rfl: 2 .  temazepam (RESTORIL) 30 MG capsule, Take 30 mg by mouth at bedtime. , Disp: , Rfl:       Objective:   There were no vitals filed for this visit.  Estimated body mass index is 32.97 kg/m as calculated from the following:   Height as of 10/13/20: 5' 9" (1.753 m).   Weight as of 10/13/20: 223 lb 4 oz (101.3 kg).  _0 @  There were no vitals filed for this visit.   Physical Exam Sounded normal pn phone      Assessment:       ICD-10-CM   1. IPF (idiopathic pulmonary fibrosis) ([place])  J84.112   2. Chronic hypoxemic respiratory failure (HCC)  J96.11   3. WHO group 3 pulmonary arterial hypertension (HCC)  I27.23  Plan:     Patient Instructions     ICD-10-CM   1. IPF (idiopathic pulmonary fibrosis) ([place])   J84.112   2. Chronic hypoxemic respiratory failure (HCC)  J96.11   3. WHO group 3 pulmonary arterial hypertension (HCC)  I27.23    contnue o2 Continue lasix contnue ofev - will have cma re-enter in med list Check lft in 1 month Do spiro in 1-2 months Refer pharmacist Orlie Pollen to tyvaso application  - we do acknowledge you could get declined  Followup Dr Chase Caller July or Aug 2022 for 30 mn visit face to face but after pft   (Telephone visit - Level 02 visit: Estb 11-20 for this visit type which was visit type: telephone visit in total care time and counseling or/and coordination of care by this undersigned MD - Dr Brand Males. This includes one or more of the following for care delivered on 10/18/2020 same day: pre-charting, chart review, note writing, documentation discussion of test results, diagnostic or treatment recommendations, prognosis, risks and benefits of management options, instructions, education, compliance or risk-factor reduction. It excludes time spent by the [place] or office staff in the care of the patient. Actual time was 17 min. E&M code is [phone])    SIGNATURE    Dr. Brand Males, M.D., F.C.C.P,  Pulmonary and Critical Care Medicine Staff Physician, [place] Director - Interstitial Lung Disease  Program  Pulmonary [place] [facility] [place], [place], [address]  Pager: [phone], If no answer or between  15:00h - 7:00h: call [phone] Telephone: [phone]  4:27 PM 10/18/2020

## 2020-10-18 NOTE — Patient Instructions (Addendum)
ICD-10-CM   1. IPF (idiopathic pulmonary fibrosis) ([place])  J84.112   2. Chronic hypoxemic respiratory failure (HCC)  J96.11   3. WHO group 3 pulmonary arterial hypertension (HCC)  I27.23    contnue o2 Continue lasix contnue ofev - will have cma re-enter in med list Check lft in 1 month Do spiro in 1-2 months Refer pharmacist Orlie Pollen to tyvaso application  - we do acknowledge you could get declined  Followup Dr Chase Caller July or Aug 2022 for 30 mn visit face to face but after pft

## 2020-10-19 ENCOUNTER — Telehealth: Payer: Self-pay | Admitting: Internal Medicine

## 2020-10-19 DIAGNOSIS — J849 Interstitial pulmonary disease, unspecified: Secondary | ICD-10-CM | POA: Diagnosis not present

## 2020-10-19 NOTE — Progress Notes (Signed)
Daily Session Note  Patient Details  Name: Garrett Peterson MRN: [id number] Date of Birth: 02-26-1946 Referring Provider:   Flowsheet Row Pulmonary Rehab from 08/10/2020 in [facility] Cardiac [facility]hab  Referring Provider Brand Males MD       Encounter Date: 10/19/2020  Check In:  Session Check In - 10/19/20 0928       Check-In   Supervising physician immediately available to respond to emergencies See telemetry face sheet for immediately available ER MD    Location [facility]-Cardiac & Pulmonary Rehab    Staff Present Birdie Sons, MPA, Mauricia Area, BS, ACSM CEP, Exercise Physiologist;Amanda Oletta Darter, BA, ACSM CEP, Exercise Physiologist    Virtual Visit No    Medication changes reported     No    Fall or balance concerns reported    No    Warm-up and Cool-down Performed on first and last piece of equipment    Resistance Training Performed Yes    VAD Patient? No      Pain Assessment   Currently in Pain? No/denies                Social History   Tobacco Use  Smoking Status Never  Smokeless Tobacco Former   Types: Chew    Goals Met:  Independence with exercise equipment Exercise tolerated well No report of cardiac concerns or symptoms Strength training completed today  Goals Unmet:  Not Applicable  Comments: Pt able to follow exercise prescription today without complaint.  Will continue to monitor for progression.    Dr. Emily Filbert is Medical Director for [place].  Dr. Ottie Glazier is Medical Director for [facility] Pulmonary Rehabilitation.

## 2020-10-19 NOTE — Addendum Note (Signed)
Addended by: Lorretta Harp on: 10/19/2020 09:54 AM   Modules accepted: Orders

## 2020-10-19 NOTE — Telephone Encounter (Signed)
Pt had televisit with MR yesterday 6/8.  Per Dr. Chase Caller, pt needs to get appt scheduled with [facility], pharmacist for new Tyvaso start.   Also was going to let pt know that I have made myself a reminder to call once MR's August schedule opened up as I will call him once schedule does open up so we can get him in for f/u with MR plus PFT prior to that [place].  I have also readded OFEV to pt's medication list. Per MR, pt will need to come to office in July for labwork (pt will not need appt scheduled for labwork).  Left pt message to return call so we can discuss this with him. I have placed AVS in mail for pt.

## 2020-10-19 NOTE — Telephone Encounter (Signed)
Patient called back from [place] message. I went over MR recs and scheduled an appt with pharmacist for Tyvaso new start. Patient verbalized understanding, nothing further needed.

## 2020-10-20 ENCOUNTER — Ambulatory Visit: Payer: Medicare Other | Admitting: Internal Medicine

## 2020-10-20 NOTE — Progress Notes (Deleted)
HPI  Patient presents today to [facility] for Initial visit with pharmacy team for Tyvaso counseling. Pertinent past medical history includes ***.   Therapy for ILD: Ofev 168m twice daily  Anticoagulant use: No  OBJECTIVE Allergies  Allergen Reactions   Ofev [Nintedanib] Diarrhea and Nausea Only    Outpatient Encounter Medications as of 10/25/2020  Medication Sig   aspirin EC 81 MG tablet Take 1 tablet (81 mg total) by mouth daily. Swallow whole.   atorvastatin (LIPITOR) 20 MG tablet Take 20 mg by mouth daily.   dextromethorphan-guaiFENesin (MUCINEX DM) 30-600 MG 12hr tablet Take 1 tablet by mouth 2 (two) times daily as needed for cough.   furosemide (LASIX) 40 MG tablet Take 1 tablet (40 mg total) by mouth daily.   metoprolol tartrate (LOPRESSOR) 25 MG tablet Take 25 mg by mouth 2 (two) times daily.   mometasone (ELOCON) 0.1 % cream Apply 1 application topically as directed. Qd to bid up to 5 days a week to aa psoriasis on arms until clear, then prn flares   Nintedanib (OFEV) 150 MG CAPS Take 150 mg by mouth 2 (two) times daily.   predniSONE (DELTASONE) 5 MG tablet Take 1 tablet (5 mg total) by mouth daily with breakfast.   temazepam (RESTORIL) 30 MG capsule Take 30 mg by mouth at bedtime.    No facility-administered encounter medications on file as of 10/25/2020.     Immunization History  Administered Date(s) Administered   PFIZER(Purple Top)SARS-COV-2 Vaccination 01/18/2020   Zoster Recombinat (Shingrix) 01/12/2018, 03/21/2018     HRCT  PFT's No results found for: FEV1, FVC, FEV1FVC, TLC, DLCO   CMP     Component Value Date/Time   NA 143 10/13/2020 1043   NA 142 09/20/2011 1104   K 3.7 10/13/2020 1043   K 3.8 09/20/2011 1104   CL 99 10/13/2020 1043   CL 107 09/20/2011 1104   CO2 27 10/13/2020 1043   CO2 28 09/20/2011 1104   GLUCOSE 160 (H) 10/13/2020 1043   GLUCOSE 279 (H) 06/14/2020 1146   GLUCOSE 119 (H) 09/20/2011 1104   BUN 8 10/13/2020 1043    BUN 12 09/20/2011 1104   CREATININE 0.93 10/13/2020 1043   CREATININE 0.99 09/20/2011 1104   CALCIUM 9.4 10/13/2020 1043   CALCIUM 8.8 09/20/2011 1104   PROT 6.5 09/21/2020 1005   PROT 6.4 08/15/2020 0858   ALBUMIN 3.8 09/21/2020 1005   ALBUMIN 4.0 08/15/2020 0858   AST 34 09/21/2020 1005   ALT 39 09/21/2020 1005   ALKPHOS 97 09/21/2020 1005   BILITOT 0.5 09/21/2020 1005   BILITOT 0.6 08/15/2020 0858   GFRNONAA >60 06/14/2020 1146   GFRNONAA >60 09/20/2011 1104   GFRAA >60 02/02/2020 0817   GFRAA >60 09/20/2011 1104     CBC    Component Value Date/Time   WBC 9.3 09/27/2020 1259   WBC 12.4 (H) 09/21/2020 1005   RBC 4.07 (L) 09/27/2020 1259   RBC 3.94 (L) 09/21/2020 1005   HGB 14.2 09/27/2020 1259   HCT 41.6 09/27/2020 1259   PLT 171 09/27/2020 1259   MCV 102 (H) 09/27/2020 1259   MCV 99 09/20/2011 1104   MCH 34.9 (H) 09/27/2020 1259   MCH 34.2 (H) 06/14/2020 1146   MCHC 34.1 09/27/2020 1259   MCHC 34.4 09/21/2020 1005   RDW 12.8 09/27/2020 1259   RDW 12.9 09/20/2011 1104   LYMPHSABS 1.0 06/14/2020 1146   LYMPHSABS 1.9 09/20/2011 1104   MONOABS 0.5 06/14/2020 1146  MONOABS 0.4 09/20/2011 1104   EOSABS 0.1 06/14/2020 1146   EOSABS 0.2 09/20/2011 1104   BASOSABS 0.0 06/14/2020 1146   BASOSABS 0.1 09/20/2011 1104     LFT's Hepatic Function Latest Ref Rng & Units 09/21/2020 08/15/2020 06/14/2020  Total Protein 6.0 - 8.3 g/dL 6.5 6.4 7.5  Albumin 3.5 - 5.2 g/dL 3.8 4.0 3.9  AST 0 - 37 U/L 34 27 55(H)  ALT 0 - 53 U/L 39 45(H) 110(H)  Alk Phosphatase 39 - 117 U/L 97 96 118  Total Bilirubin 0.2 - 1.2 mg/dL 0.5 0.6 0.8  Bilirubin, Direct 0.0 - 0.3 mg/dL 0.1 - -     ASSESSMENT  Tyvaso Medication Management Patient counseled on purpose, proper use, and common side effects of Tyvaso including cough, headache, nausea, dizziness, flushing, throat irritation and pharyngolaryngeal pain.  Reviewed less common but serious side effects including low blood pressure and increased  bleeding risk.  Goals of therapy: increase mobility (improved 6MWT), imrpvoed amount of strain on heart based on NT-proBNP)  Dose for PH-ILD: 18 mcg (3 inhalations) 4 times per day administered every 4 hours while patient is awake; if 3 inhalations are not tolerated, reduce to 1 to 2 inhalations, then increase to 3 inhalations as tolerated. Goal is to increase each dose by 3 inhalations at ~1- to 2-week intervals as tolerated. Discussed that dose is increased by tolerability.  Target dose is 72 mcg (12 inhalations) 4 times per day.   Adverse Effects: Common: cough, headache, nausea, dizziness, flushing, throat irritation and pharyngolaryngeal pain. Less frequent but serious: low blood pressure and increased bleeding risk  Access: Discussed coverage through medical benefit. Discussed Chiropractor who makes home visit for first dose of Tyvaso. Discussed possibility of moving to DPI option in future once stable on therapy pending availability and renewed benefits investigation through pharmacy benefit.  Medication Reconciliation  A drug regimen assessment was performed, including review of allergies, interactions, disease-state management, dosing and immunization history. Medications were reviewed with the patient, including name, instructions, indication, goals of therapy, potential side effects, importance of adherence, and safe use.  Drug interaction(s):   Anticoagulant use: No  Immunizations  Patient is indicated for the influenzae, pneumonia, and shingles vaccinations.  PLAN - Complete Tyvaso referral form for {tyvasoreferral:25707} and [employer] patient assistance paperwork. - Provider portion of paperwork placed in Dr. Golden Pop mailbox to be completed.  All questions encouraged and answered.  Instructed patient to call with any further questions or concerns.  Thank you for allowing pharmacy to participate in this patient's care.  This appointment  required  {CHL ONC TIME VISIT - DGREU:[id number]} of patient care (this includes precharting, chart review, review of results, face-to-face care, etc.).

## 2020-10-23 ENCOUNTER — Other Ambulatory Visit: Payer: Self-pay

## 2020-10-23 ENCOUNTER — Inpatient Hospital Stay: Payer: Medicare Other | Attending: Oncology

## 2020-10-23 DIAGNOSIS — Z452 Encounter for adjustment and management of vascular access device: Secondary | ICD-10-CM | POA: Diagnosis present

## 2020-10-23 DIAGNOSIS — C20 Malignant neoplasm of rectum: Secondary | ICD-10-CM | POA: Insufficient documentation

## 2020-10-23 DIAGNOSIS — Z95828 Presence of other vascular implants and grafts: Secondary | ICD-10-CM

## 2020-10-23 MED ORDER — HEPARIN SOD (PORK) LOCK FLUSH 100 UNIT/ML IV SOLN
INTRAVENOUS | Status: AC
  Filled 2020-10-23: qty 5

## 2020-10-23 MED ORDER — SODIUM CHLORIDE 0.9% FLUSH
10.0000 mL | Freq: Once | INTRAVENOUS | Status: AC
  Administered 2020-10-23: 10 mL via INTRAVENOUS
  Filled 2020-10-23: qty 10

## 2020-10-23 MED ORDER — HEPARIN SOD (PORK) LOCK FLUSH 100 UNIT/ML IV SOLN
500.0000 [IU] | Freq: Once | INTRAVENOUS | Status: AC
  Administered 2020-10-23: 500 [IU] via INTRAVENOUS
  Filled 2020-10-23: qty 5

## 2020-10-24 DIAGNOSIS — J849 Interstitial pulmonary disease, unspecified: Secondary | ICD-10-CM

## 2020-10-24 DIAGNOSIS — J9611 Chronic respiratory failure with hypoxia: Secondary | ICD-10-CM

## 2020-10-24 NOTE — Progress Notes (Signed)
Daily Session Note  Patient Details  Name: Garrett Peterson MRN: [id number] Date of Birth: 12-21-1945 Referring Provider:   Flowsheet Row Pulmonary Rehab from 08/10/2020 in [facility]  Referring Provider Brand Males MD       Encounter Date: 10/24/2020  Check In:  Session Check In - 10/24/20 0920       Check-In   Supervising physician immediately available to respond to emergencies See telemetry face sheet for immediately available ER MD    Location [facility] & [facility] Re[facility]    Staff Present Birdie Sons, MPA, RN;Laureen Owens Shark, BS, RRT, CPFT;Kara Eliezer Bottom, MS, ASCM CEP, Exercise Physiologist    Virtual Visit No    Medication changes reported     No    Fall or balance concerns reported    No    Warm-up and Cool-down Performed on first and last piece of equipment    Resistance Training Performed Yes    VAD Patient? No    PAD/SET Patient? No      Pain Assessment   Currently in Pain? No/denies                Social History   Tobacco Use  Smoking Status Never  Smokeless Tobacco Former   Types: Chew    Goals Met:  Independence with exercise equipment Exercise tolerated well No report of cardiac concerns or symptoms Strength training completed today  Goals Unmet:  Not Applicable  Comments: Pt able to follow exercise prescription today without complaint.  Will continue to monitor for progression.    Dr. Emily Filbert is Medical Director for [place].  Dr. Ottie Glazier is Medical Director for [facility] Pul[facility] Rehabilitation.

## 2020-10-25 ENCOUNTER — Encounter: Payer: Self-pay | Admitting: *Deleted

## 2020-10-25 ENCOUNTER — Other Ambulatory Visit: Payer: Medicare Other | Admitting: Pharmacist

## 2020-10-25 ENCOUNTER — Telehealth: Payer: Self-pay

## 2020-10-25 DIAGNOSIS — C20 Malignant neoplasm of rectum: Secondary | ICD-10-CM

## 2020-10-25 DIAGNOSIS — Z95828 Presence of other vascular implants and grafts: Secondary | ICD-10-CM

## 2020-10-25 DIAGNOSIS — J849 Interstitial pulmonary disease, unspecified: Secondary | ICD-10-CM

## 2020-10-25 NOTE — Telephone Encounter (Signed)
Patient currently has not follow up. Dr. Tasia Catchings would like a repeat CT before seeing him.   Please schedule lab/CT chest abd pel (same day ok) next available date, then MD a few days after lab/CT.

## 2020-10-25 NOTE — Progress Notes (Addendum)
HPI  [name] presents today to [facility] with his grandson, Garrett Peterson, for Initial visit with pharmacy team for Tyvaso counseling. Pertinent past medical history includes IPF, CAD, HTN, rectal cancer (stage 3b), history of thrombocytopenia, HLD. He and his grandson are curious about side effects, cost, approval process timeframe, and clinical studies supporting use of Tyvaso.  He continues on Ofev as prescribed - states he has had change in stool consistency but diarrhea is intermittent. Ofev is noted in allergy list as nausea/diarrhea . Garrett Peterson states that his IPF started with a dry cough that became progressively worse, and is currently open to trying options that will provide him benefit.  Therapy for IPF: Ofev 150 mg twice daily  Anticoagulant use: No, but he does take daily aspirin  OBJECTIVE Allergies  Allergen Reactions   Ofev [Nintedanib] Diarrhea and Nausea Only    Outpatient Encounter Medications as of 10/26/2020  Medication Sig   aspirin EC 81 MG tablet Take 1 tablet (81 mg total) by mouth daily. Swallow whole.   atorvastatin (LIPITOR) 20 MG tablet Take 20 mg by mouth daily.   dextromethorphan-guaiFENesin (MUCINEX DM) 30-600 MG 12hr tablet Take 1 tablet by mouth 2 (two) times daily as needed for cough.   furosemide (LASIX) 40 MG tablet Take 1 tablet (40 mg total) by mouth daily.   metoprolol tartrate (LOPRESSOR) 25 MG tablet Take 25 mg by mouth 2 (two) times daily.   mometasone (ELOCON) 0.1 % cream Apply 1 application topically as directed. Qd to bid up to 5 days a week to aa psoriasis on arms until clear, then prn flares   Nintedanib (OFEV) 150 MG CAPS Take 150 mg by mouth 2 (two) times daily.   predniSONE (DELTASONE) 5 MG tablet Take 1 tablet (5 mg total) by mouth daily with breakfast.   temazepam (RESTORIL) 30 MG capsule Take 30 mg by mouth at bedtime.    No facility-administered encounter medications on file as of 10/26/2020.     Immunization History   Administered Date(s) Administered   PFIZER(Purple Top)SARS-COV-2 Vaccination 06/09/2019, 06/30/2019, 01/18/2020   Zoster Recombinat (Shingrix) 01/12/2018, 03/21/2018     HRCT  PFT's No results found for: FEV1, FVC, FEV1FVC, TLC, DLCO   CMP     Component Value Date/Time   NA 143 10/13/2020 1043   NA 142 09/20/2011 1104   K 3.7 10/13/2020 1043   K 3.8 09/20/2011 1104   CL 99 10/13/2020 1043   CL 107 09/20/2011 1104   CO2 27 10/13/2020 1043   CO2 28 09/20/2011 1104   GLUCOSE 160 (H) 10/13/2020 1043   GLUCOSE 279 (H) 06/14/2020 1146   GLUCOSE 119 (H) 09/20/2011 1104   BUN 8 10/13/2020 1043   BUN 12 09/20/2011 1104   CREATININE 0.93 10/13/2020 1043   CREATININE 0.99 09/20/2011 1104   CALCIUM 9.4 10/13/2020 1043   CALCIUM 8.8 09/20/2011 1104   PROT 6.5 09/21/2020 1005   PROT 6.4 08/15/2020 0858   ALBUMIN 3.8 09/21/2020 1005   ALBUMIN 4.0 08/15/2020 0858   AST 34 09/21/2020 1005   ALT 39 09/21/2020 1005   ALKPHOS 97 09/21/2020 1005   BILITOT 0.5 09/21/2020 1005   BILITOT 0.6 08/15/2020 0858   GFRNONAA >60 06/14/2020 1146   GFRNONAA >60 09/20/2011 1104   GFRAA >60 02/02/2020 0817   GFRAA >60 09/20/2011 1104     CBC    Component Value Date/Time   WBC 9.3 09/27/2020 1259   WBC 12.4 (H) 09/21/2020 1005   RBC  4.07 (L) 09/27/2020 1259   RBC 3.94 (L) 09/21/2020 1005   HGB 14.2 09/27/2020 1259   HCT 41.6 09/27/2020 1259   PLT 171 09/27/2020 1259   MCV 102 (H) 09/27/2020 1259   MCV 99 09/20/2011 1104   MCH 34.9 (H) 09/27/2020 1259   MCH 34.2 (H) 06/14/2020 1146   MCHC 34.1 09/27/2020 1259   MCHC 34.4 09/21/2020 1005   RDW 12.8 09/27/2020 1259   RDW 12.9 09/20/2011 1104   LYMPHSABS 1.0 06/14/2020 1146   LYMPHSABS 1.9 09/20/2011 1104   MONOABS 0.5 06/14/2020 1146   MONOABS 0.4 09/20/2011 1104   EOSABS 0.1 06/14/2020 1146   EOSABS 0.2 09/20/2011 1104   BASOSABS 0.0 06/14/2020 1146   BASOSABS 0.1 09/20/2011 1104     LFT's Hepatic Function Latest Ref Rng & Units  09/21/2020 08/15/2020 06/14/2020  Total Protein 6.0 - 8.3 g/dL 6.5 6.4 7.5  Albumin 3.5 - 5.2 g/dL 3.8 4.0 3.9  AST 0 - 37 U/L 34 27 55(H)  ALT 0 - 53 U/L 39 45(H) 110(H)  Alk Phosphatase 39 - 117 U/L 97 96 118  Total Bilirubin 0.2 - 1.2 mg/dL 0.5 0.6 0.8  Bilirubin, Direct 0.0 - 0.3 mg/dL 0.1 - -     ASSESSMENT  Tyvaso Medication Management Patient counseled on purpose, proper use, and common side effects of Tyvaso including cough, headache, nausea, dizziness, flushing, throat irritation and pharyngolaryngeal pain.  Reviewed less common but serious side effects including low blood pressure and increased bleeding risk.  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 Goal is to increase each dose by 3 inhalations at ~1- to 2-week intervals as tolerated. Discussed that dose is increased by tolerability.  Target dose is 72 mcg (12 inhalations) 4 times per day.   Adverse Effects: Common: cough, headache, nausea, dizziness, flushing, throat irritation and pharyngolaryngeal pain. Less frequent but serious: low blood pressure and increased bleeding risk  Access: Discussed coverage through medical benefit. Discussed specialty pharmacy nurse who makes home visit for first dose of Tyvaso. We watched Tyvaso video together that reviews administration using Tyvaso device. Discussed possibility of moving to DPI option in future once stable on therapy pending availability and renewed benefits investigation through pharmacy benefit.  Medication Reconciliation  A drug regimen assessment was  performed, including review of allergies, interactions, disease-state management, dosing and immunization history. Medications were reviewed with the patient, including name, instructions, indication, goals of therapy, potential side effects, importance of adherence, and safe use.  Drug interaction(s): low-dose aspirin, but minimal risk of bleeding given route of administration  He des take metoprolol and furosemide but no other antihypertensives. We reviewed that in the case of hypotension with initiation of Tyvaso, other medication doses may be adjusted.  Anticoagulant use: No  Immunizations  He has received 3 COVID19 vaccines. UTD on zoster vaccines.  PLAN - Completed Tyvaso referral form for [employer]: Phone: [phone]      Fax: [phone] and [employer] patient assistance paperwork. He has been advised that if [facility] finds that Tyvaso is costly through medical benefit, they will refer him  to [employer] patient assistance program.  - Patient and grandson advised that [facility] will be reaching out to him over the course of the next 4 weeks through this benefits investigation process. Advised that he will hear from our clinic less about this to prevent confusion. Advised that our pharmacy team will reach out if we receive notification that [facility] has been unsuccessful in reaching him. - We were able to set up Mr. Bluemel's MyChart on his grandson's cell phone who is helping to manage his healthcare. He was curious about a Youtube video we watched that was ~20 min long and details Tyvaso use (this video is unlisted so not able to be searched). I sent a direct link via Mychart as well as URL for perusal at their leisure. - Provided Tyvaso booklets for information.  His grandson, Garrett Peterson, states he will continue doing research about Tyvaso as well which we welcome.  All questions encouraged and answered.  Instructed patient to call with any further questions or  concerns.  Thank you for allowing pharmacy to participate in this patient's care.  This appointment required  90 minutes of patient care (this includes precharting, chart review, review of results, face-to-face care, etc.).    [name], PharmD, MPH Clinical Pharmacist (Rheumatology and Pulmonology)

## 2020-10-25 NOTE — Progress Notes (Signed)
Pulmonary Individual Treatment Plan  Patient Details  Name: Garrett Peterson MRN: [id number] Date of Birth: 04-Mar-1946 Referring Provider:   Flowsheet Row Pulmonary Rehab from 08/10/2020 in [facility] Cardiac and Pulmonary Rehab  Referring Provider Brand Males MD       Initial Encounter Date:  Flowsheet Row Pulmonary Rehab from 08/10/2020 in [facility] Cardiac and Pulmonary Rehab  Date 08/10/20       Visit Diagnosis: ILD (interstitial lung disease) ([place])  Patient's Home Medications on Admission:  Current Outpatient Medications:    aspirin EC 81 MG tablet, Take 1 tablet (81 mg total) by mouth daily. Swallow whole., Disp: , Rfl:    atorvastatin (LIPITOR) 20 MG tablet, Take 20 mg by mouth daily., Disp: , Rfl:    dextromethorphan-guaiFENesin (MUCINEX DM) 30-600 MG 12hr tablet, Take 1 tablet by mouth 2 (two) times daily as needed for cough., Disp: 60 tablet, Rfl: 0   furosemide (LASIX) 40 MG tablet, Take 1 tablet (40 mg total) by mouth daily., Disp: 30 tablet, Rfl: 5   metoprolol tartrate (LOPRESSOR) 25 MG tablet, Take 25 mg by mouth 2 (two) times daily., Disp: , Rfl:    mometasone (ELOCON) 0.1 % cream, Apply 1 application topically as directed. Qd to bid up to 5 days a week to aa psoriasis on arms until clear, then prn flares, Disp: 45 g, Rfl: 0   Nintedanib (OFEV) 150 MG CAPS, Take 150 mg by mouth 2 (two) times daily., Disp: , Rfl:    predniSONE (DELTASONE) 5 MG tablet, Take 1 tablet (5 mg total) by mouth daily with breakfast., Disp: 30 tablet, Rfl: 2   temazepam (RESTORIL) 30 MG capsule, Take 30 mg by mouth at bedtime. , Disp: , Rfl:   Past Medical History: Past Medical History:  Diagnosis Date   (HFpEF) heart failure with preserved ejection fraction ([place])    a. 05/2020 Echo: EF 55-60%, no rwma, Gr1 DD, mildly red RV fxn; b. 09/2020 RHC: PCWP 73mHg. CO/CI 4.7/2.1.   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 09/2020 Cath: LM 70d, LAD 70ost, 80/714mLCX 100ost/p, 99p/m, RCA 100ost/p, VG->OM2 406mIMA->LAD nl, VG->RPDA nl-->Med Rx.   Cancer ([place]   rectal   Hyperlipidemia    Hypertension    Interstitial lung disease ([place]  Pneumonia    Pre-diabetes    Respiratory failure with hypoxia (HCC)     Tobacco Use: Social History   Tobacco Use  Smoking Status Never  Smokeless Tobacco Former   Types: Chew    Labs: Recent Review Flowsheet Data   There is no flowsheet data to display.      Pulmonary Assessment Scores:  Pulmonary Assessment Scores     Row Name 08/10/20 1004         ADL UCSD   ADL Phase Entry     SOB Score total 66     Rest 0     Walk 1     Stairs 4     Bath 1     Dress 1     Shop 3           CAT Score     CAT Score 18           mMRC Score     mMRC Score 4  UCSD: Self-administered rating of dyspnea associated with activities of daily living (ADLs) 6-point scale (0 = "not at all" to 5 = "maximal or unable to do because of breathlessness")  Scoring Scores range from 0 to 120.  Minimally important difference is 5 units  CAT: CAT can identify the health impairment of COPD patients and is better correlated with disease progression.  CAT has a scoring range of zero to 40. The CAT score is classified into four groups of low (less than 10), medium (10 - 20), high (21-30) and very high (31-40) based on the impact level of disease on health status. A CAT score over 10 suggests significant symptoms.  A worsening CAT score could be explained by an exacerbation, poor medication adherence, poor inhaler technique, or progression of COPD or comorbid conditions.  CAT MCID is 2 points  mMRC: mMRC (Modified Medical Research Council) Dyspnea Scale is used to assess the degree of baseline functional disability in patients of respiratory disease due to dyspnea. No minimal important  difference is established. A decrease in score of 1 point or greater is considered a positive change.   Pulmonary Function Assessment:   Exercise Target Goals: Exercise Program Goal: Individual exercise prescription set using results from initial 6 min walk test and THRR while considering  patient's activity barriers and safety.   Exercise Prescription Goal: Initial exercise prescription builds to 30-45 minutes a day of aerobic activity, 2-3 days per week.  Home exercise guidelines will be given to patient during program as part of exercise prescription that the participant will acknowledge.  Education: Aerobic Exercise: - Group verbal and visual presentation on the components of exercise prescription. Introduces F.I.T.T principle from ACSM for exercise prescriptions.  Reviews F.I.T.T. principles of aerobic exercise including progression. Written material given at graduation. Flowsheet Row Pulmonary Rehab from 10/12/2020 in [facility] Cardiac and Pulmonary Rehab  Education need identified 08/10/20       Education: Resistance Exercise: - Group verbal and visual presentation on the components of exercise prescription. Introduces F.I.T.T principle from ACSM for exercise prescriptions  Reviews F.I.T.T. principles of resistance exercise including progression. Written material given at graduation.    Education: Exercise & Equipment Safety: - Individual verbal instruction and demonstration of equipment use and safety with use of the equipment. Flowsheet Row Pulmonary Rehab from 10/12/2020 in [facility] Cardiac and Pulmonary Rehab  Date 08/10/20  Educator [facility]  Instruction Review Code 1- Verbalizes Understanding       Education: Exercise Physiology & General Exercise Guidelines: - Group verbal and written instruction with models to review the exercise physiology of the cardiovascular system and associated critical values. Provides general exercise guidelines with specific guidelines to those with heart or  lung disease.    Education: Flexibility, Balance, Mind/Body Relaxation: - Group verbal and visual presentation with interactive activity on the components of exercise prescription. Introduces F.I.T.T principle from ACSM for exercise prescriptions. Reviews F.I.T.T. principles of flexibility and balance exercise training including progression. Also discusses the mind body connection.  Reviews various relaxation techniques to help reduce and manage stress (i.e. Deep breathing, progressive muscle relaxation, and visualization). Balance handout provided to take home. Written material given at graduation.   Activity Barriers & Risk Stratification:  Activity Barriers & Cardiac Risk Stratification - 08/10/20 0954       Activity Barriers & Cardiac Risk Stratification   Activity Barriers Deconditioning;Shortness of Breath;Muscular Weakness;Balance Concerns             6 Minute Walk:  6 Minute Walk  Row Name 08/10/20 0952         6 Minute Walk   Phase Initial     Distance 400 feet     Walk Time 3 minutes  stopped due to desaturation     # of Rest Breaks 0     MPH 1.52     METS 0.66     RPE 13     Perceived Dyspnea  3     VO2 Peak 2.29     Symptoms Yes (comment)     Comments SOB, chest tightness 4/10     Resting HR 57 bpm     Resting BP 132/64     Resting Oxygen Saturation  98 %     Exercise Oxygen Saturation  during 6 min walk 79 %     Max Ex. HR 89 bpm     Max Ex. BP 148/74     2 Minute Post BP 136/70           Interval HR     1 Minute HR 74     2 Minute HR 84     3 Minute HR 89     2 Minute Post HR 71     Interval Heart Rate? Yes           Interval Oxygen     Interval Oxygen? Yes     Baseline Oxygen Saturation % 98 %     1 Minute Oxygen Saturation % 89 %     1 Minute Liters of Oxygen 4 L  continuous     2 Minute Oxygen Saturation % 83 %     2 Minute Liters of Oxygen 4 L     3 Minute Oxygen Saturation % 79 %     3 Minute Liters of Oxygen 4 L     2 Minute Post  Oxygen Saturation % 97 %     2 Minute Post Liters of Oxygen 4 L            Oxygen Initial Assessment:  Oxygen Initial Assessment - 08/10/20 1003       Home Oxygen   Home Oxygen Device E-Tanks    Sleep Oxygen Prescription Continuous    Liters per minute 4    Home Exercise Oxygen Prescription Continuous    Liters per minute 4    Home Resting Oxygen Prescription Continuous    Liters per minute 4    Compliance with Home Oxygen Use Yes      Initial 6 min Walk   Oxygen Used Continuous;E-Tanks    Liters per minute 4      Program Oxygen Prescription   Program Oxygen Prescription Continuous;E-Tanks    Liters per minute 4      Intervention   Short Term Goals To learn and exhibit compliance with exercise, home and travel O2 prescription;To learn and understand importance of monitoring SPO2 with pulse oximeter and demonstrate accurate use of the pulse oximeter.;To learn and understand importance of maintaining oxygen saturations>88%;To learn and demonstrate proper use of respiratory medications;To learn and demonstrate proper pursed lip breathing techniques or other breathing techniques.     Long  Term Goals Exhibits compliance with exercise, home  and travel O2 prescription;Verbalizes importance of monitoring SPO2 with pulse oximeter and return demonstration;Maintenance of O2 saturations>88%;Exhibits proper breathing techniques, such as pursed lip breathing or other method taught during program session;Compliance with respiratory medication;Demonstrates proper use of MDI's  Oxygen Re-Evaluation:  Oxygen Re-Evaluation     Row Name 08/14/20 4163 09/26/20 0942 10/17/20 1042         Program Oxygen Prescription   Program Oxygen Prescription Continuous;E-Tanks Continuous;E-Tanks Continuous;E-Tanks     Liters per minute _0 Home Oxygen       Home Oxygen Device E-Tanks Home Concentrator;E-Tanks Home Concentrator;E-Tanks     Sleep Oxygen Prescription Continuous  Continuous Continuous     Liters per minute _1 Home Exercise Oxygen Prescription Continuous Continuous Continuous     Liters per minute _2 Home Resting Oxygen Prescription Continuous Continuous Continuous     Liters per minute _3 Compliance with Home Oxygen Use Yes Yes Yes           Goals/Expected Outcomes       Short Term Goals To learn and demonstrate proper pursed lip breathing techniques or other breathing techniques.  Other Other     Long  Term Goals Exhibits proper breathing techniques, such as pursed lip breathing or other method taught during program session Other Other     Comments Reviewed PLB technique with pt.  Talked about how it works and it's importance in maintaining their exercise saturations. Patient has been sick with Bronchitis and is now ready to resume rehab. He wants to get his shortness of breath under control and work on his breathing techniques. Korion is glad to be back feeling better and is determined to feel better.  He sees pulmonologist Friday and wants to be able to use 2L of oxygen.     Goals/Expected Outcomes Short: Become more profiecient at using PLB.   Long: Become independent at using PLB. Short: continue rehab and work on breathing techniques. Long: reduce shortness of breath. Short:  get back to regular exercise Long:  be able to use 2L oxygen             Oxygen Discharge (Final Oxygen Re-Evaluation):  Oxygen Re-Evaluation - 10/17/20 1042       Program Oxygen Prescription   Program Oxygen Prescription Continuous;E-Tanks    Liters per minute 4      Home Oxygen   Home Oxygen Device Home Concentrator;E-Tanks    Sleep Oxygen Prescription Continuous    Liters per minute 4    Home Exercise Oxygen Prescription Continuous    Liters per minute 4    Home Resting Oxygen Prescription Continuous    Liters per minute 4    Compliance with Home Oxygen Use Yes      Goals/Expected Outcomes   Short Term Goals Other    Long  Term Goals  Other    Comments Jamare is glad to be back feeling better and is determined to feel better.  He sees pulmonologist Friday and wants to be able to use 2L of oxygen.    Goals/Expected Outcomes Short:  get back to regular exercise Long:  be able to use 2L oxygen             Initial Exercise Prescription:  Initial Exercise Prescription - 08/10/20 0900       Date of Initial Exercise RX and Referring Provider   Date 08/10/20    Referring Provider Brand Males MD      Oxygen   Oxygen Continuous    Liters 4      Treadmill   MPH  1.5    Grade 0    Minutes 15   2 min walk 1 min rest   METs 2.15      Recumbant Bike   Level 1    RPM 50    Watts 10    Minutes 15    METs 2      NuStep   Level 1    SPM 80    Minutes 15    METs 2      Prescription Details   Frequency (times per week) 3    Duration Progress to 30 minutes of continuous aerobic without signs/symptoms of physical distress      Intensity   THRR 40-80% of Max Heartrate 92-127    Ratings of Perceived Exertion 11-13    Perceived Dyspnea 0-4      Progression   Progression Continue to progress workloads to maintain intensity without signs/symptoms of physical distress.      Resistance Training   Training Prescription Yes    Weight 3 lb    Reps 10-15             Perform Capillary Blood Glucose checks as needed.  Exercise Prescription Changes:   Exercise Prescription Changes     Row Name 08/10/20 0900 08/21/20 0800 09/04/20 1500 10/02/20 1600 10/16/20 1100     Response to Exercise   Blood Pressure (Admit) 132/64 138/74 134/62 132/60 126/74   Blood Pressure (Exercise) 148/74 160/76 162/72 142/84 150/82   Blood Pressure (Exit) 124/72 122/60 122/64 142/70 132/72   Heart Rate (Admit) 57 bpm 59 bpm 69 bpm 60 bpm 78 bpm   Heart Rate (Exercise) 89 bpm 85 bpm 107 bpm 80 bpm 128 bpm   Heart Rate (Exit) 68 bpm 75 bpm 100 bpm 75 bpm 78 bpm   Oxygen Saturation (Admit) 98 % 95 % 98 % 96 % 91 %   Oxygen  Saturation (Exercise) 79 % 86 % 89 % 87 % --   Oxygen Saturation (Exit) 95 % 95 % 90 % 96 % 98 %   Rating of Perceived Exertion (Exercise) _0 Perceived Dyspnea (Exercise) _1 0 --   Symptoms SOB, chest tightness 4/10 SOB SOB SOB SOB   Comments walk test results third day from last day attended 4/11 -- --   Duration -- Progress to 30 minutes of  aerobic without signs/symptoms of physical distress Progress to 30 minutes of  aerobic without signs/symptoms of physical distress Progress to 30 minutes of  aerobic without signs/symptoms of physical distress Progress to 30 minutes of  aerobic without signs/symptoms of physical distress   Intensity -- THRR unchanged THRR unchanged THRR unchanged THRR unchanged     Progression   Progression -- Continue to progress workloads to maintain intensity without signs/symptoms of physical distress. Continue to progress workloads to maintain intensity without signs/symptoms of physical distress. Continue to progress workloads to maintain intensity without signs/symptoms of physical distress. Continue to progress workloads to maintain intensity without signs/symptoms of physical distress.   Average METs -- 2.07 1.9 1.95 2     Resistance Training   Training Prescription -- Yes Yes Yes Yes   Weight -- 3 lb 3 lb 3 lb 3 lb   Reps -- 10-15 10-15 10-15 10-15     Interval Training   Interval Training -- -- No No No     Oxygen   Oxygen -- Continuous Continuous Continuous Continuous   Liters -- 4 4 4  4     Treadmill   MPH -- 1.5 -- -- --   Grade -- 0 -- -- --   Minutes -- 15 -- -- --   METs -- 2.15 -- -- --     Recumbant Bike   Level -- -- 1 -- --   Minutes -- -- 15 -- --     NuStep   Level -- 3 3 -- 3   Minutes -- 15 15 -- 15   METs -- 2 1.9 -- 2     T5 Nustep   Level -- -- -- 1 --   Minutes -- -- -- 15 --   METs -- -- -- 1.9 --     Biostep-RELP   Level -- -- -- 1 1   Minutes -- -- -- 15 15   METs -- -- -- 2 --             Exercise Comments:   Exercise Comments     Row Name 08/14/20 0810 08/21/20 0814         Exercise Comments First full day of exercise!  Patient was oriented to gym and equipment including functions, settings, policies, and procedures.  Patient's individual exercise prescription and treatment plan were reviewed.  All starting workloads were established based on the results of the 6 minute walk test done at initial orientation visit.  The plan for exercise progression was also introduced and progression will be customized based on patient's performance and goals. Forgets to replace oxygen cannula after blowing nose.  Sats drop to low 80's               Exercise Goals and Review:   Exercise Goals     Row Name 08/10/20 1000             Exercise Goals   Increase Physical Activity Yes       Intervention Provide advice, education, support and counseling about physical activity/exercise needs.;Develop an individualized exercise prescription for aerobic and resistive training based on initial evaluation findings, risk stratification, comorbidities and participant's personal goals.       Expected Outcomes Short Term: Attend rehab on a regular basis to increase amount of physical activity.;Long Term: Add in home exercise to make exercise part of routine and to increase amount of physical activity.;Long Term: Exercising regularly at least 3-5 days a week.       Increase Strength and Stamina Yes       Intervention Develop an individualized exercise prescription for aerobic and resistive training based on initial evaluation findings, risk stratification, comorbidities and participant's personal goals.;Provide advice, education, support and counseling about physical activity/exercise needs.       Expected Outcomes Short Term: Increase workloads from initial exercise prescription for resistance, speed, and METs.;Short Term: Perform resistance training exercises routinely during rehab and add in  resistance training at home;Long Term: Improve cardiorespiratory fitness, muscular endurance and strength as measured by increased METs and functional capacity (6MWT)       Able to understand and use rate of perceived exertion (RPE) scale Yes       Intervention Provide education and explanation on how to use RPE scale       Expected Outcomes Short Term: Able to use RPE daily in rehab to express subjective intensity level;Long Term:  Able to use RPE to guide intensity level when exercising independently       Able to understand and use Dyspnea scale Yes  Intervention Provide education and explanation on how to use Dyspnea scale       Expected Outcomes Short Term: Able to use Dyspnea scale daily in rehab to express subjective sense of shortness of breath during exertion;Long Term: Able to use Dyspnea scale to guide intensity level when exercising independently       Knowledge and understanding of Target Heart Rate Range (THRR) Yes       Intervention Provide education and explanation of THRR including how the numbers were predicted and where they are located for reference       Expected Outcomes Short Term: Able to state/look up THRR;Short Term: Able to use daily as guideline for intensity in rehab;Long Term: Able to use THRR to govern intensity when exercising independently       Able to check pulse independently Yes       Intervention Provide education and demonstration on how to check pulse in carotid and radial arteries.;Review the importance of being able to check your own pulse for safety during independent exercise       Expected Outcomes Short Term: Able to explain why pulse checking is important during independent exercise;Long Term: Able to check pulse independently and accurately       Understanding of Exercise Prescription Yes       Intervention Provide education, explanation, and written materials on patient's individual exercise prescription       Expected Outcomes Short Term: Able to  explain program exercise prescription;Long Term: Able to explain home exercise prescription to exercise independently                Exercise Goals Re-Evaluation :  Exercise Goals Re-Evaluation     Row Name 08/14/20 0810 08/21/20 0845 09/04/20 1542 09/26/20 0939 10/02/20 1558     Exercise Goal Re-Evaluation   Exercise Goals Review Able to understand and use rate of perceived exertion (RPE) scale;Knowledge and understanding of Target Heart Rate Range (THRR);Understanding of Exercise Prescription;Able to understand and use Dyspnea scale Increase Physical Activity;Increase Strength and Stamina -- Increase Physical Activity;Increase Strength and Stamina Increase Physical Activity;Increase Strength and Stamina;Understanding of Exercise Prescription   Comments Reviewed RPE and dyspnea scales, THR and program prescription with pt today.  Pt voiced understanding and was given a copy of goals to take home. Colvin has had some difficulty with exercise.  His oxygen has dropped to 86%.  Staff reviewed PLB and reminded him to keep cannula in place as it has not been at times during exercise. Out since last review He is back from being sick and now he is ready to get back to exercise. [name] has attended twice since his return.  He will need to attend consistently again. We will start to increase workloads and monitor his progress.   Expected Outcomes Short: Use RPE daily to regulate intensity. Long: Follow program prescription in THR. Short: continue to work on PLB Long:  Build overall stamina -- Short: attend [facility] regularly. Long: maintain exercise regularly independently. Short: Begin to increase workloads Long: Continue to improve stamina    Row Name 10/16/20 1106 10/17/20 1029           Exercise Goal Re-Evaluation   Exercise Goals Review Increase Physical Activity;Increase Strength and Stamina Increase Physical Activity;Increase Strength and Stamina      Comments [name] has attended two times last  week but missed the week before.  Staff have spoken with him about consistent attendance. Reviewed home exercise with pt today.  Pt plans to use  bike at home and join the [employer] for exercise.  Reviewed THR, pulse, RPE, sign and symptoms, pulse oximetery and when to call 911 or MD.  Also discussed weather considerations and indoor options.  Pt voiced understanding.      Expected Outcomes Short: attend at least 6 times per month Long:improve stamina Short: add one day of exercise at home per week Long: improve SOB with ADLS               Discharge Exercise Prescription (Final Exercise Prescription Changes):  Exercise Prescription Changes - 10/16/20 1100       Response to Exercise   Blood Pressure (Admit) 126/74    Blood Pressure (Exercise) 150/82    Blood Pressure (Exit) 132/72    Heart Rate (Admit) 78 bpm    Heart Rate (Exercise) 128 bpm    Heart Rate (Exit) 78 bpm    Oxygen Saturation (Admit) 91 %    Oxygen Saturation (Exit) 98 %    Rating of Perceived Exertion (Exercise) 15    Symptoms SOB    Duration Progress to 30 minutes of  aerobic without signs/symptoms of physical distress    Intensity THRR unchanged      Progression   Progression Continue to progress workloads to maintain intensity without signs/symptoms of physical distress.    Average METs 2      Resistance Training   Training Prescription Yes    Weight 3 lb    Reps 10-15      Interval Training   Interval Training No      Oxygen   Oxygen Continuous    Liters 4      NuStep   Level 3    Minutes 15    METs 2      Biostep-RELP   Level 1    Minutes 15             Nutrition:  Target Goals: Understanding of nutrition guidelines, daily intake of sodium <1569m, cholesterol <2034m calories 30% from fat and 7% or less from saturated fats, daily to have 5 or more servings of fruits and vegetables.  Education: All About Nutrition: -Group instruction provided by verbal, written material, interactive  activities, discussions, models, and posters to present general guidelines for heart healthy nutrition including fat, fiber, MyPlate, the role of sodium in heart healthy nutrition, utilization of the nutrition label, and utilization of this knowledge for meal planning. Follow up email sent as well. Written material given at graduation.   Biometrics:  Pre Biometrics - 08/10/20 1001       Pre Biometrics   Height 5' 8.9" (1.75 m)    Weight 228 lb (103.4 kg)    BMI (Calculated) 33.77    Single Leg Stand 5.4 seconds              Nutrition Therapy Plan and Nutrition Goals:  Nutrition Therapy & Goals - 08/16/20 1135       Nutrition Therapy   Diet Heart healthy, low Na    Drug/Food Interactions Statins/Certain Fruits    Protein (specify units) 80g    Fiber 30 grams    Whole Grain Foods 3 servings    Saturated Fats 12 max. grams    Fruits and Vegetables 8 servings/day    Sodium 1.5 grams      Personal Nutrition Goals   Nutrition Goal ST: try diabetes friendly desserts from handout, Add different nonstarchy vegetables to 5 or more meals per week LT:    Comments  Breakfast: bacon, egg, and sausage sandwich or scrambled eggs or cornflakes (adds sugar and milk) Lunch: part of sandwich and crackers Dinner: meats with potatoes butter beans or corn, grilled cheese sandwich. Take out on the weekends. Snacks: sweets. Drinks: water and soda (zero) and tea (sweet tea - mcdonalds) and coffee (black). Reports T2DM (lifestyle controlled - not sure what A1C is and does not check BG at home), high cholesterol (lipitor has been helping). He had open heart surgery in the past. Discussed heart healthy eating and pulmonary MNT. [name] is interested in making changes.      Intervention Plan   Intervention Nutrition handout(s) given to patient.;Prescribe, educate and counsel regarding individualized specific dietary modifications aiming towards targeted core components such as weight, hypertension, lipid  management, diabetes, heart failure and other comorbidities.    Expected Outcomes Short Term Goal: Understand basic principles of dietary content, such as calories, fat, sodium, cholesterol and nutrients.;Short Term Goal: A plan has been developed with personal nutrition goals set during dietitian appointment.;Long Term Goal: Adherence to prescribed nutrition plan.             Nutrition Assessments:  MEDIFICTS Score Key: ?70 Need to make dietary changes  40-70 Heart Healthy Diet ? 40 Therapeutic Level Cholesterol Diet  Flowsheet Row Pulmonary Rehab from 08/10/2020 in [facility] Card[facility] Pulmonary Rehab  Picture Your Plate Total Score on Admission 55      Picture Your Plate Scores: <27 Unhealthy dietary pattern with much room for improvement. 41-50 Dietary pattern unlikely to meet recommendations for good health and room for improvement. 51-60 More healthful dietary pattern, with some room for improvement.  >60 Healthy dietary pattern, although there may be some specific behaviors that could be improved.   Nutrition Goals Re-Evaluation:  Nutrition Goals Re-Evaluation     [place] Name 09/26/20 0936 10/17/20 0944           Goals   Current Weight 226 lb (102.5 kg) --      Nutrition Goal Lose more weight and make diet changes. --      Comment He feels like he heats too much bread and he has sweets alot. He is eating ice cream, cookies and other sweets daily. He is going to try to cute back on the sweets. Lawerance states he eats pretty good. He has cut back some on sweets.  He will try to have only one sweet treat per day.      Expected Outcome Short: cut back on sweets. Long: limit sweets to a minimum. Short: continue to owrk on reducing sweets Long: one sweet treat per day               Nutrition Goals Discharge (Final Nutrition Goals Re-Evaluation):  Nutrition Goals Re-Evaluation - 10/17/20 0944       Goals   Comment Lorrie states he eats pretty good. He has cut back some on  sweets.  He will try to have only one sweet treat per day.    Expected Outcome Short: continue to owrk on reducing sweets Long: one sweet treat per day             Psychosocial: Target Goals: Acknowledge presence or absence of significant depression and/or stress, maximize coping skills, provide positive support system. Participant is able to verbalize types and ability to use techniques and skills needed for reducing stress and depression.   Education: Stress, Anxiety, and Depression - Group verbal and visual presentation to define topics covered.  Reviews how body  is impacted by stress, anxiety, and depression.  Also discusses healthy ways to reduce stress and to treat/manage anxiety and depression.  Written material given at graduation.   Education: Sleep Hygiene -Provides group verbal and written instruction about how sleep can affect your health.  Define sleep hygiene, discuss sleep cycles and impact of sleep habits. Review good sleep hygiene tips.    Initial Review & Psychosocial Screening:  Initial Psych Review & Screening - 08/04/20 1508       Initial Review   Current issues with Current Stress Concerns;Current Sleep Concerns    Source of Stress Concerns Unable to participate in former interests or hobbies;Unable to perform yard/household activities;Chronic Illness      Family Dynamics   Good Support System? Yes   God, wife, medical team     Barriers   Psychosocial barriers to participate in program There are no identifiable barriers or psychosocial needs.      Screening Interventions   Interventions Provide feedback about the scores to participant;Encouraged to exercise;To provide support and resources with identified psychosocial needs    Expected Outcomes Short Term goal: Utilizing psychosocial counselor, staff and physician to assist with identification of specific Stressors or current issues interfering with healing process. Setting desired goal for each stressor or  current issue identified.;Long Term Goal: Stressors or current issues are controlled or eliminated.;Short Term goal: Identification and review with participant of any Quality of Life or Depression concerns found by scoring the questionnaire.;Long Term goal: The participant improves quality of Life and PHQ9 Scores as seen by post scores and/or verbalization of changes             Quality of Life Scores:  Scores of 19 and below usually indicate a poorer quality of life in these areas.  A difference of  2-3 points is a clinically meaningful difference.  A difference of 2-3 points in the total score of the Quality of Life Index has been associated with significant improvement in overall quality of life, self-image, physical symptoms, and general health in studies assessing change in quality of life.  PHQ-9: Recent Review Flowsheet Data     Depression screen [facility] 2/9 08/10/2020   Decreased Interest 0   Down, Depressed, Hopeless 0   PHQ - 2 Score 0   Altered sleeping 1   Tired, decreased energy 1   Change in appetite 0   Feeling bad or failure about yourself  0   Trouble concentrating 0   Moving slowly or fidgety/restless 0   Suicidal thoughts 0   PHQ-9 Score 2   Difficult doing work/chores Not difficult at all      Interpretation of Total Score  Total Score Depression Severity:  1-4 = Minimal depression, 5-9 = Mild depression, 10-14 = Moderate depression, 15-19 = Moderately severe depression, 20-27 = Severe depression   Psychosocial Evaluation and Intervention:  Psychosocial Evaluation - 08/04/20 1517       Psychosocial Evaluation & Interventions   Interventions Encouraged to exercise with the program and follow exercise prescription;Stress management education;Relaxation education    Comments Mr. Spong has had a complicated medical history this last year and a half. He was diagnosed with colon CA last year and then randomnly started experiencing severe shortness of breaht this  last fall. 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 He used to spend a lot of his time outdoors doing different activities, but now can't due to the oxygen tanks and shortness of breath. His sleep pattern has  been unusual for the last few years with no recent change, where he sleeps only a little some nights and then up the rest of the night. 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 He received news recently that he was cancer free so now he is focused on his breathing issues. His main goal is to feel better and hopefully be able to wear a concentrator so he can do more of his hobbies outside.    Expected Outcomes Short; attend pulmonary rehab for education and exercise. Long: develop positive self care habits.    Continue Psychosocial Services  Follow up required by staff             Psychosocial Re-Evaluation:  Psychosocial Re-Evaluation     [place] Name 09/26/20 0944 10/17/20 0942           Psychosocial Re-Evaluation   Current issues with Current Stress Concerns;Current Sleep Concerns Current Stress Concerns;Current Sleep Concerns      Comments His shortness of breath and his age are starting to get to him. He is not sure how much longer he has left. He states that he has never been able to sleep good. He has regrets of not being compassionate with his father and his lung disease. Hanley is feeling much better than he was 3 weeks ago since he has got over bronchitis.  He is determined to get better.  He still does not sleep well.  He states hes always been a light sleeper.      Expected Outcomes Short: Continue to exercise regularly to support mental health and notify staff of any changes. Long: maintain mental health and well being through teaching of rehab or prescribed medications independently. Short: continue to attend LW Long: attend education to help manage stress      Interventions Encouraged to attend Pulmonary  Rehabilitation for the exercise --      Continue Psychosocial Services  Follow up required by staff --             Initial Review      Source of Stress Concerns Chronic Illness --              Psychosocial Discharge (Final Psychosocial Re-Evaluation):  Psychosocial Re-Evaluation - 10/17/20 0942       Psychosocial Re-Evaluation   Current issues with Current Stress Concerns;Current Sleep Concerns    Comments Abshir is feeling much better than he was 3 weeks ago since he has got over bronchitis.  He is determined to get better.  He still does not sleep well.  He states hes always been a light sleeper.    Expected Outcomes Short: continue to attend LW Long: attend education to help manage stress             Education: Education Goals: Education classes will be provided on a weekly basis, covering required topics. Participant will state understanding/return demonstration of topics presented.  Learning Barriers/Preferences:  Learning Barriers/Preferences - 08/04/20 1508       Learning Barriers/Preferences   Learning Barriers None    Learning Preferences None             General Pulmonary Education Topics:  Infection Prevention: - Provides verbal and written material to individual with discussion of infection control including proper hand washing and proper equipment cleaning during exercise session. Flowsheet Row Pulmonary Rehab from 10/12/2020 in [facility] Cardiac and Pulmonary Rehab  Date 08/10/20  Educator [facility]  Instruction Review Code 1- Verbalizes Understanding       Falls Prevention: - Provides verbal and written material to individual with discussion of falls prevention and safety. Flowsheet Row Pulmonary Rehab from 10/12/2020 in [facility] Cardiac and Pulmonary Rehab  Date 08/10/20  Educator [facility]  Instruction Review Code 1- Verbalizes Understanding       Chronic Lung Disease Review: - Group verbal instruction with posters, models, PowerPoint presentations and videos,   to review new updates, new respiratory medications, new advancements in procedures and treatments. Providing information on websites and "800" numbers for continued self-education. Includes information about supplement oxygen, available portable oxygen systems, continuous and intermittent flow rates, oxygen safety, concentrators, and Medicare reimbursement for oxygen. Explanation of Pulmonary Drugs, including class, frequency, complications, importance of spacers, rinsing mouth after steroid MDI's, and proper cleaning methods for nebulizers. Review of basic lung anatomy and physiology related to function, structure, and complications of lung disease. Review of risk factors. Discussion about methods for diagnosing sleep apnea and types of masks and machines for OSA. Includes a review of the use of types of environmental controls: home humidity, furnaces, filters, dust mite/pet prevention, HEPA vacuums. Discussion about weather changes, air quality and the benefits of nasal washing. Instruction on Warning signs, infection symptoms, calling MD promptly, preventive modes, and value of vaccinations. Review of effective airway clearance, coughing and/or vibration techniques. Emphasizing that all should Create an Action Plan. Written material given at graduation. Flowsheet Row Pulmonary Rehab from 10/12/2020 in [facility] Cardiac and Pulmonary Rehab  Education need identified 08/10/20  Date 10/12/20  Educator [facility]  Instruction Review Code 1- Verbalizes Understanding       AED/CPR: - Group verbal and written instruction with the use of models to demonstrate the basic use of the AED with the basic ABC's of resuscitation.    Anatomy and Cardiac Procedures: - Group verbal and visual presentation and models provide information about basic cardiac anatomy and function. Reviews the testing methods done to diagnose heart disease and the outcomes of the test results. Describes the treatment choices: Medical Management,  Angioplasty, or Coronary Bypass Surgery for treating various heart conditions including Myocardial Infarction, Angina, Valve Disease, and Cardiac Arrhythmias.  Written material given at graduation.   Medication Safety: - Group verbal and visual instruction to review commonly prescribed medications for heart and lung disease. Reviews the medication, class of the drug, and side effects. Includes the steps to properly store meds and maintain the prescription regimen.  Written material given at graduation.   Other: -Provides group and verbal instruction on various topics (see comments)   Knowledge Questionnaire Score:  Knowledge Questionnaire Score - 08/10/20 1002       Knowledge Questionnaire Score   Pre Score 13/15 Education Focus: O2 safety, exercise              Core Components/Risk Factors/Patient Goals at Admission:  Personal Goals and Risk Factors at Admission - 08/10/20 1002       Core Components/Risk Factors/Patient Goals on Admission    Weight Management Yes;Obesity;Weight Loss    Intervention Weight Management: Develop a combined nutrition and exercise program designed to reach desired caloric intake, while maintaining appropriate intake of nutrient and fiber, sodium and fats, and appropriate energy expenditure required for the weight goal.;Weight Management: Provide education and appropriate resources to help participant work on and attain dietary goals.;Weight Management/Obesity: Establish reasonable short term and long term weight goals.;Obesity: Provide education and appropriate resources to help participant work on and attain dietary goals.  Admit Weight 228 lb (103.4 kg)    Goal Weight: Short Term 220 lb (99.8 kg)    Goal Weight: Long Term 215 lb (97.5 kg)    Expected Outcomes Short Term: Continue to assess and modify interventions until short term weight is achieved;Long Term: Adherence to nutrition and physical activity/exercise program aimed toward attainment of  established weight goal;Weight Loss: Understanding of general recommendations for a balanced deficit meal plan, which promotes 1-2 lb weight loss per week and includes a negative energy balance of [phone] kcal/d;Understanding recommendations for meals to include 15-35% energy as protein, 25-35% energy from fat, 35-60% energy from carbohydrates, less than 259m of dietary cholesterol, 20-35 gm of total fiber daily;Understanding of distribution of calorie intake throughout the day with the consumption of 4-5 meals/snacks    Improve shortness of breath with ADL's Yes    Intervention Provide education, individualized exercise plan and daily activity instruction to help decrease symptoms of SOB with activities of daily living.    Expected Outcomes Short Term: Improve cardiorespiratory fitness to achieve a reduction of symptoms when performing ADLs;Long Term: Be able to perform more ADLs without symptoms or delay the onset of symptoms    Hypertension Yes    Intervention Provide education on lifestyle modifcations including regular physical activity/exercise, weight management, moderate sodium restriction and increased consumption of fresh fruit, vegetables, and low fat dairy, alcohol moderation, and smoking cessation.;Monitor prescription use compliance.    Expected Outcomes Short Term: Continued assessment and intervention until BP is < 140/956mHG in hypertensive participants. < 130/806mG in hypertensive participants with diabetes, heart failure or chronic kidney disease.;Long Term: Maintenance of blood pressure at goal levels.    Lipids Yes    Intervention Provide education and support for participant on nutrition & aerobic/resistive exercise along with prescribed medications to achieve LDL <77m75mDL >40mg23m Expected Outcomes Short Term: Participant states understanding of desired cholesterol values and is compliant with medications prescribed. Participant is following exercise prescription and nutrition  guidelines.;Long Term: Cholesterol controlled with medications as prescribed, with individualized exercise RX and with personalized nutrition plan. Value goals: LDL < 77mg,37m > 40 mg.             Education:Diabetes - Individual verbal and written instruction to review signs/symptoms of diabetes, desired ranges of glucose level fasting, after meals and with exercise. Acknowledge that pre and post exercise glucose checks will be done for 3 sessions at entry of program.   Know Your Numbers and Heart Failure: - Group verbal and visual instruction to discuss disease risk factors for cardiac and pulmonary disease and treatment options.  Reviews associated critical values for Overweight/Obesity, Hypertension, Cholesterol, and Diabetes.  Discusses basics of heart failure: signs/symptoms and treatments.  Introduces Heart Failure Zone chart for action plan for heart failure.  Written material given at graduation.   Core Components/Risk Factors/Patient Goals Review:   Goals and Risk Factor Review     Row Name 09/26/20 0935 10/17/20 0939           Core Components/Risk Factors/Patient Goals Review   Personal Goals Review Improve shortness of breath with ADL's Improve shortness of breath with ADL's      Review Spoke to patient about their shortness of breath and what they can do to improve. Patient has been informed of breathing techniques when starting the program. Patient is informed to tell staff if they have had any med changes and that certain meds they are taking or not taking can  be causing shortness of breath. Takao had a heart cath last week and his heart is great.  He sees pulmonologist Friday who has also emphasized importance of exercise.  He feels like he can do more than he could when he started.  He would  like to get down to 2L of oxygen.  We reviewed importance of consistent attendance.      Expected Outcomes Short: Attend [facility] regularly to improve shortness of breath with  ADL's. Long: maintain independence with ADL's Short: exercise at least 3 times a week Long: be able to reduce to 2L oxygen               Core Components/Risk Factors/Patient Goals at Discharge (Final Review):   Goals and Risk Factor Review - 10/17/20 0939       Core Components/Risk Factors/Patient Goals Review   Personal Goals Review Improve shortness of breath with ADL's    Review Braydan had a heart cath last week and his heart is great.  He sees pulmonologist Friday who has also emphasized importance of exercise.  He feels like he can do more than he could when he started.  He would  like to get down to 2L of oxygen.  We reviewed importance of consistent attendance.    Expected Outcomes Short: exercise at least 3 times a week Long: be able to reduce to 2L oxygen             ITP Comments:  ITP Comments     Row Name 08/04/20 1504 08/10/20 0950 08/14/20 0810 08/16/20 1141 08/30/20 0949   ITP Comments Initial telephone orientation completed. Diagnosis can be found in [facility] 3/23. EP orientation scheduled for Thursday 3/31 at 8am. Completed 6MWT and gym orientation. Initial ITP created and sent for review to Dr. Emily Filbert, Medical Director. First full day of exercise!  Patient was oriented to gym and equipment including functions, settings, policies, and procedures.  Patient's individual exercise prescription and treatment plan were reviewed.  All starting workloads were established based on the results of the 6 minute walk test done at initial orientation visit.  The plan for exercise progression was also introduced and progression will be customized based on patient's performance and goals. Completed initial RD evaluation 30 Day review completed. Medical Director ITP review done, changes made as directed, and signed approval by Medical Director.    [place] Name 09/04/20 1542 09/27/20 0650 10/25/20 0730       ITP Comments Called to check on patient. He was out sick last week with bronchitis.   His doctor cleared him to return once he feels up to it.  He is starting to feel better now.  He hopes to return on Friday 4/29. 30 Day review completed. Medical Director ITP review done, changes made as directed, and signed approval by Medical Director.  1 visit in May 30 Day review completed. Medical Director ITP review done, changes made as directed, and signed approval by Medical Director.              Comments:

## 2020-10-26 ENCOUNTER — Ambulatory Visit: Payer: Medicare Other | Admitting: Pharmacist

## 2020-10-26 ENCOUNTER — Encounter: Payer: Medicare Other | Admitting: *Deleted

## 2020-10-26 ENCOUNTER — Telehealth: Payer: Self-pay | Admitting: Oncology

## 2020-10-26 ENCOUNTER — Encounter: Payer: Self-pay | Admitting: Oncology

## 2020-10-26 ENCOUNTER — Other Ambulatory Visit: Payer: Self-pay

## 2020-10-26 DIAGNOSIS — J849 Interstitial pulmonary disease, unspecified: Secondary | ICD-10-CM | POA: Diagnosis not present

## 2020-10-26 DIAGNOSIS — Z79899 Other long term (current) drug therapy: Secondary | ICD-10-CM

## 2020-10-26 DIAGNOSIS — I2723 Pulmonary hypertension due to lung diseases and hypoxia: Secondary | ICD-10-CM

## 2020-10-26 DIAGNOSIS — J84112 Idiopathic pulmonary fibrosis: Secondary | ICD-10-CM

## 2020-10-26 NOTE — Telephone Encounter (Signed)
Called the patient to get CT scan scheduled and follow-up. Pt declined to have any appts scheduled at this time. He stated he has some other health matters he needs to tend to and will contact us when he is ready to schedule any further appts with [place].

## 2020-10-26 NOTE — Patient Instructions (Addendum)
We will start Tyvaso referral paperwork today. [facility] should be reaching out to you in the next couple of weeks.  [email or web address] Video [email or web address]   Treprostinil oral inhalation What is this medication? TREPROSTINIL is used to treat pulmonary arterial hypertension. This medicinehelps to improve exercise ability and quality of life. This medicine may be used for other purposes; ask your health care provider orpharmacist if you have questions. COMMON BRAND NAME(S): Tyvaso, Tyvaso Refill Kit, Tyvaso Starter Kit What should I tell my care team before I take this medication? They need to know if you have any of these conditions: kidney disease liver disease low blood pressure lung infection lung or breathing disease, like asthma or COPD an unusual or allergic reaction to treprostinil, epoprostenol, other medicines, foods, dyes, or preservatives pregnant or trying to get pregnant breast-feeding How should I use this medication? This medicine is used in the [place] [employer]. It is inhaled through your mouth into your lungs. Follow the detailed Tyvaso Inhalation System Instructions for Use. Follow the directions on your prescription label. Do not take your medicine more often than directed. Do not stop taking except on yourdoctor's advice. Talk to your pediatrician regarding the use of this medicine in children.Special care may be needed. Overdosage: If you think you have taken too much of this medicine contact apoison control center or emergency room at once. NOTE: This medicine is only for you. Do not share this medicine with others. What if I miss a dose? If you miss a dose, take it as soon as you can. Do not take double or extradoses. What may interact with this medication? This medicine may interact with the following medications: certain medicines for blood pressure certain medicines that treat or prevent blood clots like warfarin,  enoxaparin, dalteparin, apixaban, dabigatran, and rivaroxaban diuretics gemfibrozil rifampin This list may not describe all possible interactions. Give your health care provider a list of all the medicines, herbs, non-prescription drugs, or dietary supplements you use. Also tell them if you smoke, drink alcohol, or use illegaldrugs. Some items may interact with your medicine. What should I watch for while using this medication? Visit your doctor or health care professional for regular checks on yourprogress. Report any unusual or severe side effects immediately. Despite receiving this medicine, your condition may worsen and you may need your dose adjusted. Call your doctor or health care professional if yourcondition gets worse. Do not let this medicine get into your eyes or onto your skin. If it does,rinse your skin or eyes right away with water. What side effects may I notice from receiving this medication? Side effects that you should report to your doctor or health care professionalas soon as possible: allergic reactions like skin rash, itching or hives, swelling of the face, lips, or tongue feeling faint or lightheaded, falls unusual bleeding or bruising Side effects that usually do not require medical attention (report to yourdoctor or health care professional if they continue or are bothersome): cough flushing headache nausea sore throat This list may not describe all possible side effects. Call your doctor for medical advice about side effects. You may report side effects to FDA at1-800-FDA-1088. Where should I keep my medication? Keep out of the reach of children. Store the unopened foil pouch at room temperature between 15 and 30 degrees C (59 and 86 degrees F). Once the foil pouch is opened, ampules should be used within 7 days. Keep unopened ampules in the foil pouch to protect them from light. Once the medicine  is put into the medicine cup of the Tyvaso Inhalation System, it  should stay in the device for no more than 24 hours. Discard any unused medicine at the end of the day. Throw away any unused medicine after theexpiration date. NOTE: This sheet is a summary. It may not cover all possible information. If you have questions about this medicine, talk to your doctor, pharmacist, orhealth care provider.  2022 Elsevier/Gold Standard (2016-10-30 16:13:55)

## 2020-10-26 NOTE — Telephone Encounter (Signed)
Per phone note from [place], pt will call back to r/s as he is having other health issues.

## 2020-10-26 NOTE — Progress Notes (Signed)
Daily Session Note  Patient Details  Name: Garrett Peterson MRN: [id number] Date of Birth: 25-Mar-1946 Referring Provider:   Flowsheet Row Pulmonary Rehab from 08/10/2020 in [facility] Card[facility] and Pulmonary Rehab  Referring Provider Brand Males MD       Encounter Date: 10/26/2020  Check In:  Session Check In - 10/26/20 1003       Check-In   Supervising physician immediately available to respond to emergencies See telemetry face sheet for immediately available ER MD    Location [facility]-Cardiac & Pulmonary Rehab    Staff Present Earlean Shawl, BS, ACSM CEP, Exercise Physiologist;Amanda Oletta Darter, BA, ACSM CEP, Exercise Physiologist;Darly Fails, RN, BSN, CCRP;Joseph Hood RCP,RRT,BSRT    Virtual Visit No    Medication changes reported     No    Fall or balance concerns reported    No    Warm-up and Cool-down Performed on first and last piece of equipment    Resistance Training Performed Yes    VAD Patient? No    PAD/SET Patient? No      Pain Assessment   Currently in Pain? No/denies                Social History   Tobacco Use  Smoking Status Never  Smokeless Tobacco Former   Types: Chew    Goals Met:  Proper associated with RPD/PD & O2 Sat Independence with exercise equipment Exercise tolerated well No report of cardiac concerns or symptoms  Goals Unmet:  Not Applicable  Comments: Pt able to follow exercise prescription today without complaint.  Will continue to monitor for progression.    Dr. Emily Filbert is Medical Director for [place].  Dr. Ottie Glazier is Medical Director for [facility] Pulmonary Rehabilitation.

## 2020-10-27 ENCOUNTER — Telehealth: Payer: Self-pay | Admitting: Pharmacist

## 2020-10-27 NOTE — Telephone Encounter (Signed)
Submitted completed referral paperwork to [place] for TYVASO along with CT scans (does not seem to have had HRCT so pending coverage since this is part of current approval through Medicare), right heart cath results, and most recent progress note.  Fax# [phone] Phone# [phone]  [employer] form signed by patient is on file with application to be submitted once we receive update from [place].  I've also emailed Butch Penny, our [facility] rep, to advise that referral is incoming   [name], PharmD, MPH Clinical Pharmacist (Rheumatology and Pulmonology)

## 2020-10-31 ENCOUNTER — Other Ambulatory Visit: Payer: Self-pay

## 2020-10-31 DIAGNOSIS — J849 Interstitial pulmonary disease, unspecified: Secondary | ICD-10-CM

## 2020-10-31 NOTE — Progress Notes (Signed)
Daily Session Note  Patient Details  Name: Garrett Peterson MRN: [id number] Date of Birth: [birth year] Referring Provider:   Flowsheet Row Pulmonary Rehab from 08/10/2020 in [facility] Cardiac and Pulmonary Rehab  Referring Provider Brand Males MD       Encounter Date: 10/31/2020  Check In:  Session Check In - 10/31/20 0940       Check-In   Supervising physician immediately available to respond to emergencies See telemetry face sheet for immediately available ER MD    Location [facility]-Cardiac & Pulmonary Rehab    Staff Present Birdie Sons, MPA, RN;Amanda Oletta Darter, BA, ACSM CEP, Exercise Physiologist;Kara Eliezer Bottom, MS, ASCM CEP, Exercise Physiologist    Virtual Visit No    Medication changes reported     No    Fall or balance concerns reported    No    Warm-up and Cool-down Performed on first and last piece of equipment    Resistance Training Performed Yes    VAD Patient? No    PAD/SET Patient? No      Pain Assessment   Currently in Pain? No/denies                Social History   Tobacco Use  Smoking Status Never  Smokeless Tobacco Former   Types: Chew    Goals Met:  Independence with exercise equipment Exercise tolerated well No report of cardiac concerns or symptoms Strength training completed today  Goals Unmet:  Not Applicable  Comments: Pt able to follow exercise prescription today without complaint.  Will continue to monitor for progression.    Dr. Emily Filbert is Medical Director for [place].  Dr. Ottie Glazier is Medical Director for [facility] Pulmonary Rehabilitation.

## 2020-10-31 NOTE — Telephone Encounter (Signed)
Her most recent CT scan was in February 2022.  I prefer to wait because it is too soon for radiation exposure.  If they deny it then we can get a high-res CT.  Is that okay with procedurally at your end?

## 2020-10-31 NOTE — Telephone Encounter (Signed)
Received notification from [facility] that patient MAY be denied Tyvaso coverage through Medicare without HRCT in place since this is new criteria as of June 2022. I spoke with Cruzita Lederer, provider rep with Tyvaso, to inquire if radiologist could write a letter indicating why it was clinically recommended to avoid a HRCT. She stated Medicare criteria are generally strict and do not allow for appeals.  Per last CT chest notes from 06/23/20, HRCT was recommended in 6-12 months. Per Anderson Malta, we can place an order for HRCT and have it scheduled for August 2022 while they move forward with the auth since it may take several weeks.   For now, they will continue with benefits investigation since Tyvaso may be approved until criteria are updated. Her recommendation is that patient have HRCT scheduled sooner rather than later. She did state that he may be approved for now through his [facility] Medicare, but if traditional Medicare criteria are absorbed by all plans later this year, then he would likely be denied Tyvaso coverage in the future and this would lead to an interruption in therapy.  [name], PharmD, MPH Clinical Pharmacist (Rheumatology and Pulmonology)

## 2020-11-01 NOTE — Telephone Encounter (Signed)
Thanks and will close documentation

## 2020-11-02 ENCOUNTER — Other Ambulatory Visit: Payer: Self-pay

## 2020-11-02 DIAGNOSIS — J849 Interstitial pulmonary disease, unspecified: Secondary | ICD-10-CM

## 2020-11-02 NOTE — Progress Notes (Signed)
Daily Session Note  Patient Details  Name: Garrett Peterson MRN: [id number] Date of Birth: 04/21/1946 Referring Provider:   Flowsheet Row Pulmonary Rehab from 08/10/2020 in [facility] Cardiac and Pulmonary Rehab  Referring Provider Ramaswamy, Murali MD       Encounter Date: 11/02/2020  Check In:  Session Check In - 11/02/20 0921       Check-In   Supervising physician immediately available to respond to emergencies See telemetry face sheet for immediately available ER MD    Staff Present Kelly Bollinger, MPA, RN;Kelly Hayes, BS, ACSM CEP, Exercise Physiologist;Amanda Sommer, BA, ACSM CEP, Exercise Physiologist    Virtual Visit No    Medication changes reported     No    Fall or balance concerns reported    No    Warm-up and Cool-down Performed on first and last piece of equipment    Resistance Training Performed Yes    VAD Patient? No    PAD/SET Patient? No      Pain Assessment   Currently in Pain? No/denies                Social History   Tobacco Use  Smoking Status Never  Smokeless Tobacco Former   Types: Chew    Goals Met:  Independence with exercise equipment Exercise tolerated well No report of cardiac concerns or symptoms Strength training completed today  Goals Unmet:  Not Applicable  Comments: Pt able to follow exercise prescription today without complaint.  Will continue to monitor for progression.    Dr. Mark Miller is Medical Director for [facility].  Dr. Fuad Aleskerov is Medical Director for [facility] Pulmonary Rehabilitation. 

## 2020-11-07 ENCOUNTER — Other Ambulatory Visit: Payer: Self-pay

## 2020-11-07 ENCOUNTER — Encounter: Payer: Self-pay | Admitting: Internal Medicine

## 2020-11-07 DIAGNOSIS — J849 Interstitial pulmonary disease, unspecified: Secondary | ICD-10-CM

## 2020-11-07 NOTE — Progress Notes (Signed)
Daily Session Note  Patient Details  Name: Garrett Peterson MRN: [id number] Date of Birth: June 09, 1945 Referring Provider:   Flowsheet Row Pulmonary Rehab from 08/10/2020 in [facility] Cardiac and Pulmonary Rehab  Referring Provider Brand Males MD       Encounter Date: 11/07/2020  Check In:  Session Check In - 11/07/20 0918       Check-In   Supervising physician immediately available to respond to emergencies See telemetry face sheet for immediately available ER MD    Location [facility]-Cardiac & Pulmonary Rehab    Staff Present Birdie Sons, MPA, RN;Amanda Oletta Darter, BA, ACSM CEP, Exercise Physiologist;Kara Eliezer Bottom, MS, ASCM CEP, Exercise Physiologist    Virtual Visit No    Medication changes reported     No    Fall or balance concerns reported    No    Warm-up and Cool-down Performed on first and last piece of equipment    Resistance Training Performed Yes    VAD Patient? No    PAD/SET Patient? No      Pain Assessment   Currently in Pain? No/denies                Social History   Tobacco Use  Smoking Status Never  Smokeless Tobacco Former   Types: Chew    Goals Met:  Independence with exercise equipment Exercise tolerated well No report of cardiac concerns or symptoms Strength training completed today  Goals Unmet:  Not Applicable  Comments: Pt able to follow exercise prescription today without complaint.  Will continue to monitor for progression.    Dr. Emily Filbert is Medical Director for [place].  Dr. Ottie Glazier is Medical Director for [facility] Pulmonary Rehabilitation.

## 2020-11-09 ENCOUNTER — Other Ambulatory Visit: Payer: Self-pay

## 2020-11-09 DIAGNOSIS — J9611 Chronic respiratory failure with hypoxia: Secondary | ICD-10-CM

## 2020-11-09 DIAGNOSIS — J849 Interstitial pulmonary disease, unspecified: Secondary | ICD-10-CM

## 2020-11-09 NOTE — Progress Notes (Signed)
Daily Session Note  Patient Details  Name: Garrett Peterson MRN: [id number] Date of Birth: Mar 19, 1946 Referring Provider:   Flowsheet Row Pulmonary Rehab from 08/10/2020 in [facility] Cardiac and Pulmonary Rehab  Referring Provider Brand Males MD       Encounter Date: 11/09/2020  Check In:  Session Check In - 11/09/20 0916       Check-In   Supervising physician immediately available to respond to emergencies See telemetry face sheet for immediately available ER MD    Location [facility]-Cardiac & Pulmonary Rehab    Staff Present Birdie Sons, MPA, Elveria Rising, BA, ACSM CEP, Exercise Physiologist;Thuy Atilano Amedeo Plenty, BS, ACSM CEP, Exercise Physiologist    Virtual Visit No    Medication changes reported     No    Fall or balance concerns reported    No    Warm-up and Cool-down Performed on first and last piece of equipment    Resistance Training Performed Yes    VAD Patient? No    PAD/SET Patient? No      Pain Assessment   Currently in Pain? No/denies                Social History   Tobacco Use  Smoking Status Never  Smokeless Tobacco Former   Types: Chew    Goals Met:  Independence with exercise equipment Exercise tolerated well No report of cardiac concerns or symptoms Strength training completed today  Goals Unmet:  Not Applicable  Comments: Pt able to follow exercise prescription today without complaint.  Will continue to monitor for progression.    Dr. Emily Filbert is Medical Director for [place].  Dr. Ottie Glazier is Medical Director for [facility] Pulmonary Rehabilitation.

## 2020-11-10 NOTE — Telephone Encounter (Addendum)
Received fax from [place] for Tyvaso patient assistance. Provider forms signed by Dr. Chase Caller today. Faxed to [facility]:  Fax: [phone] Phone: [phone]  [name], PharmD, MPH Clinical Pharmacist (Rheumatology and Pulmonology)

## 2020-11-14 ENCOUNTER — Other Ambulatory Visit: Payer: Self-pay

## 2020-11-14 ENCOUNTER — Encounter: Payer: Medicare Other | Attending: Internal Medicine | Admitting: *Deleted

## 2020-11-14 DIAGNOSIS — J849 Interstitial pulmonary disease, unspecified: Secondary | ICD-10-CM

## 2020-11-14 DIAGNOSIS — J84112 Idiopathic pulmonary fibrosis: Secondary | ICD-10-CM | POA: Diagnosis not present

## 2020-11-14 NOTE — Progress Notes (Signed)
Daily Session Note  Patient Details  Name: ROBERTA KELLY MRN: [id number] Date of Birth: Nov 08, 1945 Referring Provider:   Flowsheet Row Pulmonary Rehab from 08/10/2020 in [facility] Cardiac and Pulmonary Rehab  Referring Provider Brand Males MD       Encounter Date: 11/14/2020  Check In:  Session Check In - 11/14/20 1011       Check-In   Supervising physician immediately available to respond to emergencies See telemetry face sheet for immediately available ER MD    Location [facility]-Cardiac & Pulmonary Rehab    Staff Present Heath Lark, RN, BSN, CCRP;Kristen Coble, RN,BC,MSN;Melissa Daphne, RDN, LDN;Jessica [place], MA, RCEP, CCRP, CCET    Virtual Visit No    Medication changes reported     No    Fall or balance concerns reported    No    Warm-up and Cool-down Performed on first and last piece of equipment    Resistance Training Performed Yes    VAD Patient? No    PAD/SET Patient? No      Pain Assessment   Currently in Pain? No/denies                Social History   Tobacco Use  Smoking Status Never  Smokeless Tobacco Former   Types: Chew    Goals Met:  Proper associated with RPD/PD & O2 Sat Independence with exercise equipment Exercise tolerated well No report of cardiac concerns or symptoms  Goals Unmet:  Not Applicable  Comments: Pt able to follow exercise prescription today without complaint.  Will continue to monitor for progression.    Dr. Emily Filbert is Medical Director for [place].  Dr. Ottie Glazier is Medical Director for [facility] Pulmonary Rehabilitation.

## 2020-11-15 NOTE — Telephone Encounter (Signed)
Received a fax from  [employer] and Support Team regarding an approval for [place] patient assistance from 11/15/2020 to 11/15/2021. Per letter, PAP enrollment form and prescription have already been forwarded to [place].  [facility] Phone number: [phone] [place] Phone number: [phone]

## 2020-11-16 ENCOUNTER — Encounter: Payer: Medicare Other | Admitting: *Deleted

## 2020-11-16 ENCOUNTER — Other Ambulatory Visit: Payer: Self-pay

## 2020-11-16 DIAGNOSIS — J849 Interstitial pulmonary disease, unspecified: Secondary | ICD-10-CM

## 2020-11-16 DIAGNOSIS — J84112 Idiopathic pulmonary fibrosis: Secondary | ICD-10-CM | POA: Diagnosis not present

## 2020-11-16 NOTE — Progress Notes (Signed)
Daily Session Note  Patient Details  Name: Garrett Peterson MRN: [id number] Date of Birth: 10/09/1945 Referring Provider:   Flowsheet Row Pulmonary Rehab from 08/10/2020 in [facility] Cardiac and Pulmonary Rehab  Referring Provider Brand Males MD       Encounter Date: 11/16/2020  Check In:  Session Check In - 11/16/20 1040       Check-In   Supervising physician immediately available to respond to emergencies See telemetry face sheet for immediately available ER MD    Location [facility]-Cardiac & Pulmonary Rehab    Staff Present Birdie Sons, MPA, RN;Melissa [place], RDN, LDN;Jessica Luan [place], MA, RCEP, CCRP, CCET;Ojani Berenson, RN, BSN, CCRP    Virtual Visit No    Medication changes reported     No    Fall or balance concerns reported    No    Warm-up and Cool-down Performed on first and last piece of equipment    Resistance Training Performed Yes    VAD Patient? No    PAD/SET Patient? No      Pain Assessment   Currently in Pain? No/denies                Social History   Tobacco Use  Smoking Status Never  Smokeless Tobacco Former   Types: Chew    Goals Met:  Proper associated with RPD/PD & O2 Sat Independence with exercise equipment Exercise tolerated well No report of cardiac concerns or symptoms  Goals Unmet:  Not Applicable  Comments: Pt able to follow exercise prescription today without complaint.  Will continue to monitor for progression.    Dr. Emily Filbert is Medical Director for [place].  Dr. Ottie Glazier is Medical Director for [facility] Pulmonary Rehabilitation.

## 2020-11-21 ENCOUNTER — Telehealth: Payer: Self-pay | Admitting: Internal Medicine

## 2020-11-21 ENCOUNTER — Other Ambulatory Visit: Payer: Self-pay

## 2020-11-21 DIAGNOSIS — J849 Interstitial pulmonary disease, unspecified: Secondary | ICD-10-CM

## 2020-11-21 DIAGNOSIS — J84112 Idiopathic pulmonary fibrosis: Secondary | ICD-10-CM | POA: Diagnosis not present

## 2020-11-21 NOTE — Progress Notes (Signed)
Daily Session Note  Patient Details  Name: Garrett Peterson MRN: [id number] Date of Birth: 1945-10-17 Referring Provider:   Flowsheet Row Pulmonary Rehab from 08/10/2020 in [facility] Cardiac and Pulmonary Rehab  Referring Provider Brand Males MD       Encounter Date: 11/21/2020  Check In:  Session Check In - 11/21/20 0937       Check-In   Supervising physician immediately available to respond to emergencies See telemetry face sheet for immediately available ER MD    Location [facility]-Cardiac & Pulmonary Rehab    Staff Present Birdie Sons, MPA, RN;Melissa [place], RDN, LDN;Jessica Luan [place]ing, MA, RCEP, CCRP, CCET    Virtual Visit No    Medication changes reported     No    Fall or balance concerns reported    No    Warm-up and Cool-down Performed on first and last piece of equipment    Resistance Training Performed Yes    VAD Patient? No    PAD/SET Patient? No      Pain Assessment   Currently in Pain? No/denies                Social History   Tobacco Use  Smoking Status Never  Smokeless Tobacco Former   Types: Chew    Goals Met:  Independence with exercise equipment Exercise tolerated well No report of cardiac concerns or symptoms Strength training completed today  Goals Unmet:  Not Applicable  Comments: Pt able to follow exercise prescription today without complaint.  Will continue to monitor for progression.    Dr. Emily Filbert is Medical Director for [place].  Dr. Ottie Glazier is Medical Director for [facility] Pulmonary Rehabilitation.

## 2020-11-21 NOTE — Telephone Encounter (Signed)
Called and spoke with Patient.  Patient stated he received Tyvasco yesterday and Nurse was sent out to show him how to use. Patient stated he started Tyvasco today and has had no issues, at this time. Patient stated Nurse is to come back Monday 11/27/20, for follow up. Patient stated he will contact Dr. Chase Caller, if he experiences any strong side effects.   Message routed to Dr. Chase Caller as Juluis Rainier

## 2020-11-21 NOTE — Telephone Encounter (Signed)
Pt is calling in regards to the new medicine that he started today (11/21/2020) ; Tyvaso; stated that Dr. Chase Caller wanted him to call and update him once he started it. Pls regard; [phone]

## 2020-11-22 ENCOUNTER — Encounter: Payer: Self-pay | Admitting: *Deleted

## 2020-11-22 DIAGNOSIS — J849 Interstitial pulmonary disease, unspecified: Secondary | ICD-10-CM

## 2020-11-22 NOTE — Progress Notes (Signed)
Pulmonary Individual Treatment Plan  Patient Details  Name: ROCCO KERKHOFF MRN: [id number] Date of Birth: 04-Mar-1946 Referring Provider:   Flowsheet Row Pulmonary Rehab from 08/10/2020 in [facility] Cardiac and Pulmonary Rehab  Referring Provider Brand Males MD       Initial Encounter Date:  Flowsheet Row Pulmonary Rehab from 08/10/2020 in [facility] Cardiac and Pulmonary Rehab  Date 08/10/20       Visit Diagnosis: ILD (interstitial lung disease) ([place])  Patient's Home Medications on Admission:  Current Outpatient Medications:    aspirin EC 81 MG tablet, Take 1 tablet (81 mg total) by mouth daily. Swallow whole., Disp: , Rfl:    atorvastatin (LIPITOR) 20 MG tablet, Take 20 mg by mouth daily., Disp: , Rfl:    dextromethorphan-guaiFENesin (MUCINEX DM) 30-600 MG 12hr tablet, Take 1 tablet by mouth 2 (two) times daily as needed for cough., Disp: 60 tablet, Rfl: 0   furosemide (LASIX) 40 MG tablet, Take 1 tablet (40 mg total) by mouth daily., Disp: 30 tablet, Rfl: 5   metoprolol tartrate (LOPRESSOR) 25 MG tablet, Take 25 mg by mouth 2 (two) times daily., Disp: , Rfl:    mometasone (ELOCON) 0.1 % cream, Apply 1 application topically as directed. Qd to bid up to 5 days a week to aa psoriasis on arms until clear, then prn flares, Disp: 45 g, Rfl: 0   Nintedanib (OFEV) 150 MG CAPS, Take 150 mg by mouth 2 (two) times daily., Disp: , Rfl:    predniSONE (DELTASONE) 5 MG tablet, Take 1 tablet (5 mg total) by mouth daily with breakfast., Disp: 30 tablet, Rfl: 2   temazepam (RESTORIL) 30 MG capsule, Take 30 mg by mouth at bedtime. , Disp: , Rfl:   Past Medical History: Past Medical History:  Diagnosis Date   (HFpEF) heart failure with preserved ejection fraction ([place])    a. 05/2020 Echo: EF 55-60%, no rwma, Gr1 DD, mildly red RV fxn; b. 09/2020 RHC: PCWP 73mHg. CO/CI 4.7/2.1.   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 09/2020 Cath: LM 70d, LAD 70ost, 80/714mLCX 100ost/p, 99p/m, RCA 100ost/p, VG->OM2 406mIMA->LAD nl, VG->RPDA nl-->Med Rx.   Cancer ([place]   rectal   Hyperlipidemia    Hypertension    Interstitial lung disease ([place]  Pneumonia    Pre-diabetes    Respiratory failure with hypoxia (HCC)     Tobacco Use: Social History   Tobacco Use  Smoking Status Never  Smokeless Tobacco Former   Types: Chew    Labs: Recent Review Flowsheet Data   There is no flowsheet data to display.      Pulmonary Assessment Scores:  Pulmonary Assessment Scores     Row Name 08/10/20 1004         ADL UCSD   ADL Phase Entry     SOB Score total 66     Rest 0     Walk 1     Stairs 4     Bath 1     Dress 1     Shop 3           CAT Score     CAT Score 18           mMRC Score     mMRC Score 4  UCSD: Self-administered rating of dyspnea associated with activities of daily living (ADLs) 6-point scale (0 = "not at all" to 5 = "maximal or unable to do because of breathlessness")  Scoring Scores range from 0 to 120.  Minimally important difference is 5 units  CAT: CAT can identify the health impairment of COPD patients and is better correlated with disease progression.  CAT has a scoring range of zero to 40. The CAT score is classified into four groups of low (less than 10), medium (10 - 20), high (21-30) and very high (31-40) based on the impact level of disease on health status. A CAT score over 10 suggests significant symptoms.  A worsening CAT score could be explained by an exacerbation, poor medication adherence, poor inhaler technique, or progression of COPD or comorbid conditions.  CAT MCID is 2 points  mMRC: mMRC (Modified Medical Research Council) Dyspnea Scale is used to assess the degree of baseline functional disability in patients of respiratory disease due to dyspnea. No minimal important  difference is established. A decrease in score of 1 point or greater is considered a positive change.   Pulmonary Function Assessment:   Exercise Target Goals: Exercise Program Goal: Individual exercise prescription set using results from initial 6 min walk test and THRR while considering  patient's activity barriers and safety.   Exercise Prescription Goal: Initial exercise prescription builds to 30-45 minutes a day of aerobic activity, 2-3 days per week.  Home exercise guidelines will be given to patient during program as part of exercise prescription that the participant will acknowledge.  Education: Aerobic Exercise: - Group verbal and visual presentation on the components of exercise prescription. Introduces F.I.T.T principle from ACSM for exercise prescriptions.  Reviews F.I.T.T. principles of aerobic exercise including progression. Written material given at graduation. Flowsheet Row Pulmonary Rehab from 10/12/2020 in [facility] Cardiac and Pulmonary Rehab  Education need identified 08/10/20       Education: Resistance Exercise: - Group verbal and visual presentation on the components of exercise prescription. Introduces F.I.T.T principle from ACSM for exercise prescriptions  Reviews F.I.T.T. principles of resistance exercise including progression. Written material given at graduation.    Education: Exercise & Equipment Safety: - Individual verbal instruction and demonstration of equipment use and safety with use of the equipment. Flowsheet Row Pulmonary Rehab from 10/12/2020 in [facility] Cardiac and Pulmonary Rehab  Date 08/10/20  Educator [facility]  Instruction Review Code 1- Verbalizes Understanding       Education: Exercise Physiology & General Exercise Guidelines: - Group verbal and written instruction with models to review the exercise physiology of the cardiovascular system and associated critical values. Provides general exercise guidelines with specific guidelines to those with heart or  lung disease.    Education: Flexibility, Balance, Mind/Body Relaxation: - Group verbal and visual presentation with interactive activity on the components of exercise prescription. Introduces F.I.T.T principle from ACSM for exercise prescriptions. Reviews F.I.T.T. principles of flexibility and balance exercise training including progression. Also discusses the mind body connection.  Reviews various relaxation techniques to help reduce and manage stress (i.e. Deep breathing, progressive muscle relaxation, and visualization). Balance handout provided to take home. Written material given at graduation.   Activity Barriers & Risk Stratification:  Activity Barriers & Cardiac Risk Stratification - 08/10/20 0954       Activity Barriers & Cardiac Risk Stratification   Activity Barriers Deconditioning;Shortness of Breath;Muscular Weakness;Balance Concerns             6 Minute Walk:  6 Minute Walk  Row Name 08/10/20 0952         6 Minute Walk   Phase Initial     Distance 400 feet     Walk Time 3 minutes  stopped due to desaturation     # of Rest Breaks 0     MPH 1.52     METS 0.66     RPE 13     Perceived Dyspnea  3     VO2 Peak 2.29     Symptoms Yes (comment)     Comments SOB, chest tightness 4/10     Resting HR 57 bpm     Resting BP 132/64     Resting Oxygen Saturation  98 %     Exercise Oxygen Saturation  during 6 min walk 79 %     Max Ex. HR 89 bpm     Max Ex. BP 148/74     2 Minute Post BP 136/70           Interval HR     1 Minute HR 74     2 Minute HR 84     3 Minute HR 89     2 Minute Post HR 71     Interval Heart Rate? Yes           Interval Oxygen     Interval Oxygen? Yes     Baseline Oxygen Saturation % 98 %     1 Minute Oxygen Saturation % 89 %     1 Minute Liters of Oxygen 4 L  continuous     2 Minute Oxygen Saturation % 83 %     2 Minute Liters of Oxygen 4 L     3 Minute Oxygen Saturation % 79 %     3 Minute Liters of Oxygen 4 L     2 Minute Post  Oxygen Saturation % 97 %     2 Minute Post Liters of Oxygen 4 L            Oxygen Initial Assessment:  Oxygen Initial Assessment - 08/10/20 1003       Home Oxygen   Home Oxygen Device E-Tanks    Sleep Oxygen Prescription Continuous    Liters per minute 4    Home Exercise Oxygen Prescription Continuous    Liters per minute 4    Home Resting Oxygen Prescription Continuous    Liters per minute 4    Compliance with Home Oxygen Use Yes      Initial 6 min Walk   Oxygen Used Continuous;E-Tanks    Liters per minute 4      Program Oxygen Prescription   Program Oxygen Prescription Continuous;E-Tanks    Liters per minute 4      Intervention   Short Term Goals To learn and exhibit compliance with exercise, home and travel O2 prescription;To learn and understand importance of monitoring SPO2 with pulse oximeter and demonstrate accurate use of the pulse oximeter.;To learn and understand importance of maintaining oxygen saturations>88%;To learn and demonstrate proper use of respiratory medications;To learn and demonstrate proper pursed lip breathing techniques or other breathing techniques.     Long  Term Goals Exhibits compliance with exercise, home  and travel O2 prescription;Verbalizes importance of monitoring SPO2 with pulse oximeter and return demonstration;Maintenance of O2 saturations>88%;Exhibits proper breathing techniques, such as pursed lip breathing or other method taught during program session;Compliance with respiratory medication;Demonstrates proper use of MDI's  Oxygen Re-Evaluation:  Oxygen Re-Evaluation     Row Name 08/14/20 0240 09/26/20 0942 10/17/20 1042 11/16/20 0923       Program Oxygen Prescription   Program Oxygen Prescription Continuous;E-Tanks Continuous;E-Tanks Continuous;E-Tanks Continuous;E-Tanks    Liters per minute _0 Home Oxygen        Home Oxygen Device E-Tanks Home Concentrator;E-Tanks Home Concentrator;E-Tanks Home  Concentrator;E-Tanks    Sleep Oxygen Prescription Continuous Continuous Continuous Continuous    Liters per minute _1 Home Exercise Oxygen Prescription Continuous Continuous Continuous Continuous    Liters per minute _2 Home Resting Oxygen Prescription Continuous Continuous Continuous Continuous    Liters per minute _3 Compliance with Home Oxygen Use Yes Yes Yes Yes         Goals/Expected Outcomes        Short Term Goals To learn and demonstrate proper pursed lip breathing techniques or other breathing techniques.  Other Other To learn and understand importance of monitoring SPO2 with pulse oximeter and demonstrate accurate use of the pulse oximeter.;To learn and understand importance of maintaining oxygen saturations>88%;To learn and exhibit compliance with exercise, home and travel O2 prescription;To learn and demonstrate proper pursed lip breathing techniques or other breathing techniques. ;To learn and demonstrate proper use of respiratory medications    Long  Term Goals Exhibits proper breathing techniques, such as pursed lip breathing or other method taught during program session Other Other Exhibits compliance with exercise, home  and travel O2 prescription;Verbalizes importance of monitoring SPO2 with pulse oximeter and return demonstration;Maintenance of O2 saturations>88%;Exhibits proper breathing techniques, such as pursed lip breathing or other method taught during program session;Compliance with respiratory medication;Demonstrates proper use of MDI's    Comments Reviewed PLB technique with pt.  Talked about how it works and it's importance in maintaining their exercise saturations. Patient has been sick with Bronchitis and is now ready to resume rehab. He wants to get his shortness of breath under control and work on his breathing techniques. Hillard is glad to be back feeling better and is determined to feel better.  He sees pulmonologist Friday and wants to be able  to use 2L of oxygen. [name] is doing well in rehab.  He is working on his breathing, still gets SOB when he reaches up overhead.  We talked more about using his PLB to help train breathing and getting insiprometer and flutter valve to help train his lungs too.  He really wants to get down to 2L.    Goals/Expected Outcomes Short: Become more profiecient at using PLB.   Long: Become independent at using PLB. Short: continue rehab and work on breathing techniques. Long: reduce shortness of breath. Short:  get back to regular exercise Long:  be able to use 2L oxygen Short: Work on breathing techniques Long: Continue to work on breathing.            Oxygen Discharge (Final Oxygen Re-Evaluation):  Oxygen Re-Evaluation - 11/16/20 0923       Program Oxygen Prescription   Program Oxygen Prescription Continuous;E-Tanks    Liters per minute 4      Home Oxygen   Home Oxygen Device Home Concentrator;E-Tanks    Sleep Oxygen Prescription Continuous    Liters per minute 4    Home Exercise Oxygen Prescription Continuous    Liters per minute 4    Home  Resting Oxygen Prescription Continuous    Liters per minute 4    Compliance with Home Oxygen Use Yes      Goals/Expected Outcomes   Short Term Goals To learn and understand importance of monitoring SPO2 with pulse oximeter and demonstrate accurate use of the pulse oximeter.;To learn and understand importance of maintaining oxygen saturations>88%;To learn and exhibit compliance with exercise, home and travel O2 prescription;To learn and demonstrate proper pursed lip breathing techniques or other breathing techniques. ;To learn and demonstrate proper use of respiratory medications    Long  Term Goals Exhibits compliance with exercise, home  and travel O2 prescription;Verbalizes importance of monitoring SPO2 with pulse oximeter and return demonstration;Maintenance of O2 saturations>88%;Exhibits proper breathing techniques, such as pursed lip breathing or other  method taught during program session;Compliance with respiratory medication;Demonstrates proper use of MDI's    Comments [name] is doing well in rehab.  He is working on his breathing, still gets SOB when he reaches up overhead.  We talked more about using his PLB to help train breathing and getting insiprometer and flutter valve to help train his lungs too.  He really wants to get down to 2L.    Goals/Expected Outcomes Short: Work on breathing techniques Long: Continue to work on breathing.             Initial Exercise Prescription:  Initial Exercise Prescription - 08/10/20 0900       Date of Initial Exercise RX and Referring Provider   Date 08/10/20    Referring Provider Brand Males MD      Oxygen   Oxygen Continuous    Liters 4      Treadmill   MPH 1.5    Grade 0    Minutes 15   2 min walk 1 min rest   METs 2.15      Recumbant Bike   Level 1    RPM 50    Watts 10    Minutes 15    METs 2      NuStep   Level 1    SPM 80    Minutes 15    METs 2      Prescription Details   Frequency (times per week) 3    Duration Progress to 30 minutes of continuous aerobic without signs/symptoms of physical distress      Intensity   THRR 40-80% of Max Heartrate 92-127    Ratings of Perceived Exertion 11-13    Perceived Dyspnea 0-4      Progression   Progression Continue to progress workloads to maintain intensity without signs/symptoms of physical distress.      Resistance Training   Training Prescription Yes    Weight 3 lb    Reps 10-15             Perform Capillary Blood Glucose checks as needed.  Exercise Prescription Changes:   Exercise Prescription Changes     Row Name 08/10/20 0900 08/21/20 0800 09/04/20 1500 10/02/20 1600 10/16/20 1100     Response to Exercise   Blood Pressure (Admit) 132/64 138/74 134/62 132/60 126/74   Blood Pressure (Exercise) 148/74 160/76 162/72 142/84 150/82   Blood Pressure (Exit) 124/72 122/60 122/64 142/70 132/72   Heart  Rate (Admit) 57 bpm 59 bpm 69 bpm 60 bpm 78 bpm   Heart Rate (Exercise) 89 bpm 85 bpm 107 bpm 80 bpm 128 bpm   Heart Rate (Exit) 68 bpm 75 bpm 100 bpm 75 bpm 78 bpm  Oxygen Saturation (Admit) 98 % 95 % 98 % 96 % 91 %   Oxygen Saturation (Exercise) 79 % 86 % 89 % 87 % --   Oxygen Saturation (Exit) 95 % 95 % 90 % 96 % 98 %   Rating of Perceived Exertion (Exercise) _0 Perceived Dyspnea (Exercise) _1 0 --   Symptoms SOB, chest tightness 4/10 SOB SOB SOB SOB   Comments walk test results third day from last day attended 4/11 -- --   Duration -- Progress to 30 minutes of  aerobic without signs/symptoms of physical distress Progress to 30 minutes of  aerobic without signs/symptoms of physical distress Progress to 30 minutes of  aerobic without signs/symptoms of physical distress Progress to 30 minutes of  aerobic without signs/symptoms of physical distress   Intensity -- THRR unchanged THRR unchanged THRR unchanged THRR unchanged     Progression   Progression -- Continue to progress workloads to maintain intensity without signs/symptoms of physical distress. Continue to progress workloads to maintain intensity without signs/symptoms of physical distress. Continue to progress workloads to maintain intensity without signs/symptoms of physical distress. Continue to progress workloads to maintain intensity without signs/symptoms of physical distress.   Average METs -- 2.07 1.9 1.95 2     Resistance Training   Training Prescription -- Yes Yes Yes Yes   Weight -- 3 lb 3 lb 3 lb 3 lb   Reps -- 10-15 10-15 10-15 10-15     Interval Training   Interval Training -- -- No No No     Oxygen   Oxygen -- Continuous Continuous Continuous Continuous   Liters -- _2 Treadmill   MPH -- 1.5 -- -- --   Grade -- 0 -- -- --   Minutes -- 15 -- -- --   METs -- 2.15 -- -- --     Recumbant Bike   Level -- -- 1 -- --   Minutes -- -- 15 -- --     NuStep   Level -- 3 3 -- 3   Minutes --  15 15 -- 15   METs -- 2 1.9 -- 2     T5 Nustep   Level -- -- -- 1 --   Minutes -- -- -- 15 --   METs -- -- -- 1.9 --     Biostep-RELP   Level -- -- -- 1 1   Minutes -- -- -- 15 15   METs -- -- -- 2 --    Row Name 10/31/20 1400 11/16/20 0700           Response to Exercise   Blood Pressure (Admit) 140/68 118/76      Blood Pressure (Exit) 110/62 112/64      Heart Rate (Admit) 66 bpm 78 bpm      Heart Rate (Exercise) 83 bpm 89 bpm      Heart Rate (Exit) 80 bpm 83 bpm      Oxygen Saturation (Admit) 97 % 94 %      Oxygen Saturation (Exercise) 88 % 91 %      Oxygen Saturation (Exit) 91 % 92 %      Rating of Perceived Exertion (Exercise) 13 13      Perceived Dyspnea (Exercise) 2 3      Symptoms SOB SOB      Duration Continue with 30 min of aerobic exercise without signs/symptoms of physical distress. Continue with  30 min of aerobic exercise without signs/symptoms of physical distress.      Intensity THRR unchanged THRR unchanged             Progression      Progression Continue to progress workloads to maintain intensity without signs/symptoms of physical distress. Continue to progress workloads to maintain intensity without signs/symptoms of physical distress.      Average METs 2.3 2.23             Resistance Training      Training Prescription Yes Yes      Weight 3 lb 3 lb      Reps 10-15 10-15             Interval Training      Interval Training No No             Oxygen      Oxygen Continuous Continuous      Liters 4 4             NuStep      Level 5 5      Minutes 30 30      METs 2.4 2.5             T5 Nustep      Level 4 1      Minutes 30 30      METs 1.6 1.9             Home Exercise Plan      Plans to continue exercise at Home (comment)  bike Home (comment)  bike      Frequency Add 2 additional days to program exercise sessions. Add 2 additional days to program exercise sessions.      Initial Home Exercises Provided 10/17/20 10/17/20               Exercise Comments:   Exercise Comments     Row Name 08/14/20 0810 08/21/20 0814         Exercise Comments First full day of exercise!  Patient was oriented to gym and equipment including functions, settings, policies, and procedures.  Patient's individual exercise prescription and treatment plan were reviewed.  All starting workloads were established based on the results of the 6 minute walk test done at initial orientation visit.  The plan for exercise progression was also introduced and progression will be customized based on patient's performance and goals. Forgets to replace oxygen cannula after blowing nose.  Sats drop to low 80's               Exercise Goals and Review:   Exercise Goals     Row Name 08/10/20 1000             Exercise Goals   Increase Physical Activity Yes       Intervention Provide advice, education, support and counseling about physical activity/exercise needs.;Develop an individualized exercise prescription for aerobic and resistive training based on initial evaluation findings, risk stratification, comorbidities and participant's personal goals.       Expected Outcomes Short Term: Attend rehab on a regular basis to increase amount of physical activity.;Long Term: Add in home exercise to make exercise part of routine and to increase amount of physical activity.;Long Term: Exercising regularly at least 3-5 days a week.       Increase Strength and Stamina Yes       Intervention Develop an individualized exercise prescription for aerobic and resistive training based on initial evaluation findings,  risk stratification, comorbidities and participant's personal goals.;Provide advice, education, support and counseling about physical activity/exercise needs.       Expected Outcomes Short Term: Increase workloads from initial exercise prescription for resistance, speed, and METs.;Short Term: Perform resistance training exercises routinely during rehab and add in  resistance training at home;Long Term: Improve cardiorespiratory fitness, muscular endurance and strength as measured by increased METs and functional capacity (6MWT)       Able to understand and use rate of perceived exertion (RPE) scale Yes       Intervention Provide education and explanation on how to use RPE scale       Expected Outcomes Short Term: Able to use RPE daily in rehab to express subjective intensity level;Long Term:  Able to use RPE to guide intensity level when exercising independently       Able to understand and use Dyspnea scale Yes       Intervention Provide education and explanation on how to use Dyspnea scale       Expected Outcomes Short Term: Able to use Dyspnea scale daily in rehab to express subjective sense of shortness of breath during exertion;Long Term: Able to use Dyspnea scale to guide intensity level when exercising independently       Knowledge and understanding of Target Heart Rate Range (THRR) Yes       Intervention Provide education and explanation of THRR including how the numbers were predicted and where they are located for reference       Expected Outcomes Short Term: Able to state/look up THRR;Short Term: Able to use daily as guideline for intensity in rehab;Long Term: Able to use THRR to govern intensity when exercising independently       Able to check pulse independently Yes       Intervention Provide education and demonstration on how to check pulse in carotid and radial arteries.;Review the importance of being able to check your own pulse for safety during independent exercise       Expected Outcomes Short Term: Able to explain why pulse checking is important during independent exercise;Long Term: Able to check pulse independently and accurately       Understanding of Exercise Prescription Yes       Intervention Provide education, explanation, and written materials on patient's individual exercise prescription       Expected Outcomes Short Term: Able to  explain program exercise prescription;Long Term: Able to explain home exercise prescription to exercise independently                Exercise Goals Re-Evaluation :  Exercise Goals Re-Evaluation     Row Name 08/14/20 0810 08/21/20 0845 09/04/20 1542 09/26/20 0939 10/02/20 1558     Exercise Goal Re-Evaluation   Exercise Goals Review Able to understand and use rate of perceived exertion (RPE) scale;Knowledge and understanding of Target Heart Rate Range (THRR);Understanding of Exercise Prescription;Able to understand and use Dyspnea scale Increase Physical Activity;Increase Strength and Stamina -- Increase Physical Activity;Increase Strength and Stamina Increase Physical Activity;Increase Strength and Stamina;Understanding of Exercise Prescription   Comments Reviewed RPE and dyspnea scales, THR and program prescription with pt today.  Pt voiced understanding and was given a copy of goals to take home. Logan has had some difficulty with exercise.  His oxygen has dropped to 86%.  Staff reviewed PLB and reminded him to keep cannula in place as it has not been at times during exercise. Out since last review He is back from being sick and  now he is ready to get back to exercise. [name] has attended twice since his return.  He will need to attend consistently again. We will start to increase workloads and monitor his progress.   Expected Outcomes Short: Use RPE daily to regulate intensity. Long: Follow program prescription in THR. Short: continue to work on PLB Long:  Build overall stamina -- Short: attend [facility] regularly. Long: maintain exercise regularly independently. Short: Begin to increase workloads Long: Continue to improve stamina    [place] Name 10/16/20 1106 10/17/20 1029 10/31/20 1418 11/16/20 0755 11/16/20 0922     Exercise Goal Re-Evaluation   Exercise Goals Review Increase Physical Activity;Increase Strength and Stamina Increase Physical Activity;Increase Strength and Stamina Increase  Physical Activity;Increase Strength and Stamina;Understanding of Exercise Prescription Increase Physical Activity;Increase Strength and Stamina;Understanding of Exercise Prescription Increase Physical Activity;Increase Strength and Stamina;Understanding of Exercise Prescription   Comments [name] has attended two times last week but missed the week before.  Staff have spoken with him about consistent attendance. Reviewed home exercise with pt today.  Pt plans to use bike at home and join the [employer]zone for exercise.  Reviewed THR, pulse, RPE, sign and symptoms, pulse oximetery and when to call 911 or MD.  Also discussed weather considerations and indoor options.  Pt voiced understanding. [name] is doing well in rehab. He is up to level 5 on the NuStep.  We will continue to montior his progress. [name] is working in appropriate RPE range at rehab. He continues to rest and use PLB if O2 drops below 88%. He is compliant about watching his saturations. Will continue to monitor [name] is doing well in rehab.  He feels like he is doing well other than his breathing.  He is supposed to start a new med soon.  He is riding his bike for 30 min on his off days and using some weights from [place].   Expected Outcomes Short: attend at least 6 times per month Long:improve stamina Short: add one day of exercise at home per week Long: improve SOB with ADLS Short: Increase hand weights Long: Conitnue to improve stamina Short: Increase load on T5 Long: Continue to increase overall MET level Short: Continue to add in exercise at home Long: Continue to improve stamina            Discharge Exercise Prescription (Final Exercise Prescription Changes):  Exercise Prescription Changes - 11/16/20 0700       Response to Exercise   Blood Pressure (Admit) 118/76    Blood Pressure (Exit) 112/64    Heart Rate (Admit) 78 bpm    Heart Rate (Exercise) 89 bpm    Heart Rate (Exit) 83 bpm    Oxygen Saturation (Admit) 94 %    Oxygen Saturation  (Exercise) 91 %    Oxygen Saturation (Exit) 92 %    Rating of Perceived Exertion (Exercise) 13    Perceived Dyspnea (Exercise) 3    Symptoms SOB    Duration Continue with 30 min of aerobic exercise without signs/symptoms of physical distress.    Intensity THRR unchanged      Progression   Progression Continue to progress workloads to maintain intensity without signs/symptoms of physical distress.    Average METs 2.23      Resistance Training   Training Prescription Yes    Weight 3 lb    Reps 10-15      Interval Training   Interval Training No      Oxygen   Oxygen Continuous  Liters 4      NuStep   Level 5    Minutes 30    METs 2.5      T5 Nustep   Level 1    Minutes 30    METs 1.9      Home Exercise Plan   Plans to continue exercise at Home (comment)   bike   Frequency Add 2 additional days to program exercise sessions.    Initial Home Exercises Provided 10/17/20             Nutrition:  Target Goals: Understanding of nutrition guidelines, daily intake of sodium <1542m, cholesterol <2067m calories 30% from fat and 7% or less from saturated fats, daily to have 5 or more servings of fruits and vegetables.  Education: All About Nutrition: -Group instruction provided by verbal, written material, interactive activities, discussions, models, and posters to present general guidelines for heart healthy nutrition including fat, fiber, MyPlate, the role of sodium in heart healthy nutrition, utilization of the nutrition label, and utilization of this knowledge for meal planning. Follow up email sent as well. Written material given at graduation.   Biometrics:  Pre Biometrics - 08/10/20 1001       Pre Biometrics   Height 5' 8.9" (1.75 m)    Weight 228 lb (103.4 kg)    BMI (Calculated) 33.77    Single Leg Stand 5.4 seconds              Nutrition Therapy Plan and Nutrition Goals:  Nutrition Therapy & Goals - 08/16/20 1135       Nutrition Therapy   Diet  Heart healthy, low Na    Drug/Food Interactions Statins/Certain Fruits    Protein (specify units) 80g    Fiber 30 grams    Whole Grain Foods 3 servings    Saturated Fats 12 max. grams    Fruits and Vegetables 8 servings/day    Sodium 1.5 grams      Personal Nutrition Goals   Nutrition Goal ST: try diabetes friendly desserts from handout, Add different nonstarchy vegetables to 5 or more meals per week LT:    Comments Breakfast: bacon, egg, and sausage sandwich or scrambled eggs or cornflakes (adds sugar and milk) Lunch: part of sandwich and crackers Dinner: meats with potatoes butter beans or corn, grilled cheese sandwich. Take out on the weekends. Snacks: sweets. Drinks: water and soda (zero) and tea (sweet tea - mcdonalds) and coffee (black). Reports T2DM (lifestyle controlled - not sure what A1C is and does not check BG at home), high cholesterol (lipitor has been helping). He had open heart surgery in the past. Discussed heart healthy eating and pulmonary MNT. PhAbbe Amsterdams interested in making changes.      Intervention Plan   Intervention Nutrition handout(s) given to patient.;Prescribe, educate and counsel regarding individualized specific dietary modifications aiming towards targeted core components such as weight, hypertension, lipid management, diabetes, heart failure and other comorbidities.    Expected Outcomes Short Term Goal: Understand basic principles of dietary content, such as calories, fat, sodium, cholesterol and nutrients.;Short Term Goal: A plan has been developed with personal nutrition goals set during dietitian appointment.;Long Term Goal: Adherence to prescribed nutrition plan.             Nutrition Assessments:  MEDIFICTS Score Key: ?70 Need to make dietary changes  40-70 Heart Healthy Diet ? 40 Therapeutic Level Cholesterol Diet  [facility]  Picture Your Plate  Total Score on Admission 55       Picture Your Plate Scores: <42 Unhealthy dietary pattern with much room for improvement. 41-50 Dietary pattern unlikely to meet recommendations for good health and room for improvement. 51-60 More healthful dietary pattern, with some room for improvement.  >60 Healthy dietary pattern, although there may be some specific behaviors that could be improved.   Nutrition Goals Re-Evaluation:  Nutrition Goals Re-Evaluation     [place] Name 09/26/20 0936 10/17/20 0944 11/16/20 0932         Goals   Current Weight 226 lb (102.5 kg) -- --     Nutrition Goal Lose more weight and make diet changes. -- Reduce sugar and small meals     Comment He feels like he heats too much bread and he has sweets alot. He is eating ice cream, cookies and other sweets daily. He is going to try to cute back on the sweets. Haitham states he eats pretty good. He has cut back some on sweets.  He will try to have only one sweet treat per day. Phil eats fairly well.  We talked about eating less sweets and gassy foods.  Also review small meals to help with breathing. He continues to try to eat well normally     Expected Outcome Short: cut back on sweets. Long: limit sweets to a minimum. Short: continue to owrk on reducing sweets Long: one sweet treat per day Sort: Work on reducing sweets Long: Conitnue to eat healthy.              Nutrition Goals Discharge (Final Nutrition Goals Re-Evaluation):  Nutrition Goals Re-Evaluation - 11/16/20 0932       Goals   Nutrition Goal Reduce sugar and small meals    Comment Phil eats fairly well.  We talked about eating less sweets and gassy foods.  Also review small meals to help with breathing. He continues to try to eat well normally    Expected Outcome Sort: Work on reducing sweets Long: Conitnue to eat healthy.             Psychosocial: Target Goals: Acknowledge presence or absence of significant depression and/or stress, maximize coping skills, provide positive support  system. Participant is able to verbalize types and ability to use techniques and skills needed for reducing stress and depression.   Education: Stress, Anxiety, and Depression - Group verbal and visual presentation to define topics covered.  Reviews how body is impacted by stress, anxiety, and depression.  Also discusses healthy ways to reduce stress and to treat/manage anxiety and depression.  Written material given at graduation.   Education: Sleep Hygiene -Provides group verbal and written instruction about how sleep can affect your health.  Define sleep hygiene, discuss sleep cycles and impact of sleep habits. Review good sleep hygiene tips.    Initial Review & Psychosocial Screening:  Initial Psych Review & Screening - 08/04/20 1508       Initial Review   Current issues with Current Stress Concerns;Current Sleep Concerns    Source of Stress Concerns Unable to participate in former interests or hobbies;Unable to perform yard/household activities;Chronic Illness      Family Dynamics   Good Support System? Yes   God, wife, medical team     Barriers   Psychosocial barriers to participate in program There are no identifiable barriers or psychosocial needs.      Screening Interventions   Interventions Provide feedback about the scores to participant;Encouraged to exercise;To provide  support and resources with identified psychosocial needs    Expected Outcomes Short Term goal: Utilizing psychosocial counselor, staff and physician to assist with identification of specific Stressors or current issues interfering with healing process. Setting desired goal for each stressor or current issue identified.;Long Term Goal: Stressors or current issues are controlled or eliminated.;Short Term goal: Identification and review with participant of any Quality of Life or Depression concerns found by scoring the questionnaire.;Long Term goal: The participant improves quality of Life and PHQ9 Scores as seen by  post scores and/or verbalization of changes             Quality of Life Scores:  Scores of 19 and below usually indicate a poorer quality of life in these areas.  A difference of  2-3 points is a clinically meaningful difference.  A difference of 2-3 points in the total score of the Quality of Life Index has been associated with significant improvement in overall quality of life, self-image, physical symptoms, and general health in studies assessing change in quality of life.  PHQ-9: Recent Review Flowsheet Data     Depression screen [facility] 2/9 08/10/2020   Decreased Interest 0   Down, Depressed, Hopeless 0   PHQ - 2 Score 0   Altered sleeping 1   Tired, decreased energy 1   Change in appetite 0   Feeling bad or failure about yourself  0   Trouble concentrating 0   Moving slowly or fidgety/restless 0   Suicidal thoughts 0   PHQ-9 Score 2   Difficult doing work/chores Not difficult at all      Interpretation of Total Score  Total Score Depression Severity:  1-4 = Minimal depression, 5-9 = Mild depression, 10-14 = Moderate depression, 15-19 = Moderately severe depression, 20-27 = Severe depression   Psychosocial Evaluation and Intervention:  Psychosocial Evaluation - 08/04/20 1517       Psychosocial Evaluation & Interventions   Interventions Encouraged to exercise with the program and follow exercise prescription;Stress management education;Relaxation education    Comments Mr. Knighton has had a complicated medical history this last year and a half. He was diagnosed with colon CA last year and then randomnly started experiencing severe shortness of breaht this last fall. 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 He used to spend a lot of his time outdoors doing different activities, but now can't due to the oxygen tanks and shortness of breath. His sleep pattern has been unusual for the last few years with no recent change, where he sleeps only a little some nights and then up  the rest of the night. 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 He received news recently that he was cancer free so now he is focused on his breathing issues. His main goal is to feel better and hopefully be able to wear a concentrator so he can do more of his hobbies outside.    Expected Outcomes Short; attend pulmonary rehab for education and exercise. Long: develop positive self care habits.    Continue Psychosocial Services  Follow up required by staff             Psychosocial Re-Evaluation:  Psychosocial Re-Evaluation     [place] Name 09/26/20 0944 10/17/20 0942 11/16/20 0933         Psychosocial Re-Evaluation   Current issues with Current Stress Concerns;Current Sleep Concerns Current Stress Concerns;Current Sleep Concerns Current Stress Concerns;Current Sleep  Concerns     Comments His shortness of breath and his age are starting to get to him. He is not sure how much longer he has left. He states that he has never been able to sleep good. He has regrets of not being compassionate with his father and his lung disease. Bryon is feeling much better than he was 3 weeks ago since he has got over bronchitis.  He is determined to get better.  He still does not sleep well.  He states hes always been a light sleeper. [name] is doing well, still frustrated with his breathing.  Overall, he is doing well mentally.  He still does not sleep well but has not talked to doctor about his sleep.  We also mentioned getting tools to help with breathing training at home.     Expected Outcomes Short: Continue to exercise regularly to support mental health and notify staff of any changes. Long: maintain mental health and well being through teaching of rehab or prescribed medications independently. Short: continue to attend LW Long: attend education to help manage stress Short: Talk to doctor about breathing and sleep. Long: Continue to focus on positive.      Interventions Encouraged to attend Pulmonary Rehabilitation for the exercise -- Encouraged to attend Pulmonary Rehabilitation for the exercise     Continue Psychosocial Services  Follow up required by staff -- Follow up required by staff           Initial Review       Source of Stress Concerns Chronic Illness -- --             Psychosocial Discharge (Final Psychosocial Re-Evaluation):  Psychosocial Re-Evaluation - 11/16/20 0933       Psychosocial Re-Evaluation   Current issues with Current Stress Concerns;Current Sleep Concerns    Comments [name] is doing well, still frustrated with his breathing.  Overall, he is doing well mentally.  He still does not sleep well but has not talked to doctor about his sleep.  We also mentioned getting tools to help with breathing training at home.    Expected Outcomes Short: Talk to doctor about breathing and sleep. Long: Continue to focus on positive.    Interventions Encouraged to attend Pulmonary Rehabilitation for the exercise    Continue Psychosocial Services  Follow up required by staff             Education: Education Goals: Education classes will be provided on a weekly basis, covering required topics. Participant will state understanding/return demonstration of topics presented.  Learning Barriers/Preferences:  Learning Barriers/Preferences - 08/04/20 1508       Learning Barriers/Preferences   Learning Barriers None    Learning Preferences None             General Pulmonary Education Topics:  Infection Prevention: - Provides verbal and written material to individual with discussion of infection control including proper hand washing and proper equipment cleaning during exercise session. Flowsheet Row Pulmonary Rehab from 10/12/2020 in [facility] Pulmonary Rehab  Date 08/10/20  Educator [facility]  Instruction Review Code 1- Verbalizes Understanding       Falls Prevention: - Provides verbal and written material to  individual with discussion of falls prevention and safety. Flowsheet Row Pulmonary Rehab from 10/12/2020 in [facility] Cardiac and Pulmonary Rehab  Date 08/10/20  Educator [facility]  Instruction Review Code 1- Verbalizes Understanding       Chronic Lung Disease Review: - Group verbal instruction with posters, models, PowerPoint  presentations and videos,  to review new updates, new respiratory medications, new advancements in procedures and treatments. Providing information on websites and "800" numbers for continued self-education. Includes information about supplement oxygen, available portable oxygen systems, continuous and intermittent flow rates, oxygen safety, concentrators, and Medicare reimbursement for oxygen. Explanation of Pulmonary Drugs, including class, frequency, complications, importance of spacers, rinsing mouth after steroid MDI's, and proper cleaning methods for nebulizers. Review of basic lung anatomy and physiology related to function, structure, and complications of lung disease. Review of risk factors. Discussion about methods for diagnosing sleep apnea and types of masks and machines for OSA. Includes a review of the use of types of environmental controls: home humidity, furnaces, filters, dust mite/pet prevention, HEPA vacuums. Discussion about weather changes, air quality and the benefits of nasal washing. Instruction on Warning signs, infection symptoms, calling MD promptly, preventive modes, and value of vaccinations. Review of effective airway clearance, coughing and/or vibration techniques. Emphasizing that all should Create an Action Plan. Written material given at graduation. Flowsheet Row Pulmonary Rehab from 10/12/2020 in [facility] Cardiac and Pulmonary Rehab  Education need identified 08/10/20  Date 10/12/20  Educator [facility]  Instruction Review Code 1- Verbalizes Understanding       AED/CPR: - Group verbal and written instruction with the use of models to demonstrate the basic use of the  AED with the basic ABC's of resuscitation.    Anatomy and Cardiac Procedures: - Group verbal and visual presentation and models provide information about basic cardiac anatomy and function. Reviews the testing methods done to diagnose heart disease and the outcomes of the test results. Describes the treatment choices: Medical Management, Angioplasty, or Coronary Bypass Surgery for treating various heart conditions including Myocardial Infarction, Angina, Valve Disease, and Cardiac Arrhythmias.  Written material given at graduation.   Medication Safety: - Group verbal and visual instruction to review commonly prescribed medications for heart and lung disease. Reviews the medication, class of the drug, and side effects. Includes the steps to properly store meds and maintain the prescription regimen.  Written material given at graduation.   Other: -Provides group and verbal instruction on various topics (see comments)   Knowledge Questionnaire Score:  Knowledge Questionnaire Score - 08/10/20 1002       Knowledge Questionnaire Score   Pre Score 13/15 Education Focus: O2 safety, exercise              Core Components/Risk Factors/Patient Goals at Admission:  Personal Goals and Risk Factors at Admission - 08/10/20 1002       Core Components/Risk Factors/Patient Goals on Admission    Weight Management Yes;Obesity;Weight Loss    Intervention Weight Management: Develop a combined nutrition and exercise program designed to reach desired caloric intake, while maintaining appropriate intake of nutrient and fiber, sodium and fats, and appropriate energy expenditure required for the weight goal.;Weight Management: Provide education and appropriate resources to help participant work on and attain dietary goals.;Weight Management/Obesity: Establish reasonable short term and long term weight goals.;Obesity: Provide education and appropriate resources to help participant work on and attain dietary  goals.    Admit Weight 228 lb (103.4 kg)    Goal Weight: Short Term 220 lb (99.8 kg)    Goal Weight: Long Term 215 lb (97.5 kg)    Expected Outcomes Short Term: Continue to assess and modify interventions until short term weight is achieved;Long Term: Adherence to nutrition and physical activity/exercise program aimed toward attainment of established weight goal;Weight Loss: Understanding of general recommendations for a  balanced deficit meal plan, which promotes 1-2 lb weight loss per week and includes a negative energy balance of [phone] kcal/d;Understanding recommendations for meals to include 15-35% energy as protein, 25-35% energy from fat, 35-60% energy from carbohydrates, less than 276m of dietary cholesterol, 20-35 gm of total fiber daily;Understanding of distribution of calorie intake throughout the day with the consumption of 4-5 meals/snacks    Improve shortness of breath with ADL's Yes    Intervention Provide education, individualized exercise plan and daily activity instruction to help decrease symptoms of SOB with activities of daily living.    Expected Outcomes Short Term: Improve cardiorespiratory fitness to achieve a reduction of symptoms when performing ADLs;Long Term: Be able to perform more ADLs without symptoms or delay the onset of symptoms    Hypertension Yes    Intervention Provide education on lifestyle modifcations including regular physical activity/exercise, weight management, moderate sodium restriction and increased consumption of fresh fruit, vegetables, and low fat dairy, alcohol moderation, and smoking cessation.;Monitor prescription use compliance.    Expected Outcomes Short Term: Continued assessment and intervention until BP is < 140/955mHG in hypertensive participants. < 130/8047mG in hypertensive participants with diabetes, heart failure or chronic kidney disease.;Long Term: Maintenance of blood pressure at goal levels.    Lipids Yes    Intervention Provide  education and support for participant on nutrition & aerobic/resistive exercise along with prescribed medications to achieve LDL <70m54mDL >40mg38m Expected Outcomes Short Term: Participant states understanding of desired cholesterol values and is compliant with medications prescribed. Participant is following exercise prescription and nutrition guidelines.;Long Term: Cholesterol controlled with medications as prescribed, with individualized exercise RX and with personalized nutrition plan. Value goals: LDL < 70mg,19m > 40 mg.             Education:Diabetes - Individual verbal and written instruction to review signs/symptoms of diabetes, desired ranges of glucose level fasting, after meals and with exercise. Acknowledge that pre and post exercise glucose checks will be done for 3 sessions at entry of program.   Know Your Numbers and Heart Failure: - Group verbal and visual instruction to discuss disease risk factors for cardiac and pulmonary disease and treatment options.  Reviews associated critical values for Overweight/Obesity, Hypertension, Cholesterol, and Diabetes.  Discusses basics of heart failure: signs/symptoms and treatments.  Introduces Heart Failure Zone chart for action plan for heart failure.  Written material given at graduation.   Core Components/Risk Factors/Patient Goals Review:   Goals and Risk Factor Review     Row Name 09/26/20 0935 10/17/20 0939 11/16/20 0930         Core Components/Risk Factors/Patient Goals Review   Personal Goals Review Improve shortness of breath with ADL's Improve shortness of breath with ADL's Improve shortness of breath with ADL's;Weight Management/Obesity;Hypertension     Review Spoke to patient about their shortness of breath and what they can do to improve. Patient has been informed of breathing techniques when starting the program. Patient is informed to tell staff if they have had any med changes and that certain meds they are taking or  not taking can be causing shortness of breath. PhilipLadislao heart cath last week and his heart is great.  He sees pulmonologist Friday who has also emphasized importance of exercise.  He feels like he can do more than he could when he started.  He would  like to get down to 2L of oxygen.  We reviewed importance of consistent attendance. PhilAbbe Amsterdam  is down to 220 lb and holding steady.  His doctor would like for him to get down to 215 lb.  He checks his pressures 2x a day and usually its pretty good, highest has been 144.  His breathing is still his biggest limitation but he has not been using PLB so we talked more about it today.     Expected Outcomes Short: Attend [facility] regularly to improve shortness of breath with ADL's. Long: maintain independence with ADL's Short: exercise at least 3 times a week Long: be able to reduce to 2L oxygen Short: Work on PLB Long: COnitnue to work on [employer] loss              Core Components/Risk Factors/Patient Goals at Discharge (Final Review):   Goals and Risk Factor Review - 11/16/20 0930       Core Components/Risk Factors/Patient Goals Review   Personal Goals Review Improve shortness of breath with ADL's;Weight Management/Obesity;Hypertension    Review [name] is down to 220 lb and holding steady.  His doctor would like for him to get down to 215 lb.  He checks his pressures 2x a day and usually its pretty good, highest has been 144.  His breathing is still his biggest limitation but he has not been using PLB so we talked more about it today.    Expected Outcomes Short: Work on PLB Long: COnitnue to work on weight loss             ITP Comments:  ITP Comments     Row Name 08/04/20 1504 08/10/20 0950 08/14/20 0810 08/16/20 1141 08/30/20 0949   ITP Comments Initial telephone orientation completed. Diagnosis can be found in [facility] 3/23. EP orientation scheduled for Thursday 3/31 at 8am. Completed 6MWT and gym orientation. Initial ITP created and sent for review to  Dr. Emily Filbert, Medical Director. First full day of exercise!  Patient was oriented to gym and equipment including functions, settings, policies, and procedures.  Patient's individual exercise prescription and treatment plan were reviewed.  All starting workloads were established based on the results of the 6 minute walk test done at initial orientation visit.  The plan for exercise progression was also introduced and progression will be customized based on patient's performance and goals. Completed initial RD evaluation 30 Day review completed. Medical Director ITP review done, changes made as directed, and signed approval by Medical Director.    [place] Name 09/04/20 1542 09/27/20 0650 10/25/20 0730 11/22/20 0650     ITP Comments Called to check on patient. He was out sick last week with bronchitis.  His doctor cleared him to return once he feels up to it.  He is starting to feel better now.  He hopes to return on Friday 4/29. 30 Day review completed. Medical Director ITP review done, changes made as directed, and signed approval by Medical Director.  1 visit in May 30 Day review completed. Medical Director ITP review done, changes made as directed, and signed approval by Medical Director. 30 Day review completed. Medical Director ITP review done, changes made as directed, and signed approval by Medical Director.             Comments:

## 2020-11-24 ENCOUNTER — Ambulatory Visit (INDEPENDENT_AMBULATORY_CARE_PROVIDER_SITE_OTHER): Payer: Medicare Other | Admitting: Nurse Practitioner

## 2020-11-24 ENCOUNTER — Encounter: Payer: Self-pay | Admitting: Nurse Practitioner

## 2020-11-24 ENCOUNTER — Other Ambulatory Visit: Payer: Self-pay

## 2020-11-24 VITALS — BP 126/74 | HR 86 | Ht 69.0 in | Wt 222.0 lb

## 2020-11-24 DIAGNOSIS — I5032 Chronic diastolic (congestive) heart failure: Secondary | ICD-10-CM | POA: Diagnosis not present

## 2020-11-24 DIAGNOSIS — I2510 Atherosclerotic heart disease of native coronary artery without angina pectoris: Secondary | ICD-10-CM

## 2020-11-24 DIAGNOSIS — I10 Essential (primary) hypertension: Secondary | ICD-10-CM | POA: Diagnosis not present

## 2020-11-24 DIAGNOSIS — J9611 Chronic respiratory failure with hypoxia: Secondary | ICD-10-CM

## 2020-11-24 DIAGNOSIS — E785 Hyperlipidemia, unspecified: Secondary | ICD-10-CM | POA: Diagnosis not present

## 2020-11-24 NOTE — Progress Notes (Signed)
Office Visit    Patient Name: Garrett Peterson Date of Encounter: 11/24/2020  Primary Care Provider:  Albina Billet, MD Primary Cardiologist:  Nelva Bush, MD  Chief Complaint    75 year old male with history of CAD status post CABG x4 in 2010, hypertension, hyperlipidemia, interstitial lung disease, and rectal cancer, who presents for follow-up related to dyspnea and fatigue.  Past Medical History    Past Medical History:  Diagnosis Date   (HFpEF) heart failure with preserved ejection fraction ([place])    a. 05/2020 Echo: EF 55-60%, no rwma, Gr1 DD, mildly red RV fxn; b. 09/2020 RHC: PCWP 3mHg. CO/CI 4.7/2.1.   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 09/2020 Cath: LM 70d, LAD 70ost, 80/758mLCX 100ost/p, 99p/m, RCA 100ost/p, VG->OM2 4051mIMA->LAD nl, VG->RPDA nl-->Med Rx.   [place]cer ([place]   rectal   Hyperlipidemia    Hypertension    Inter[place] disease ([place]  Pneumonia    Pre-diabetes    [facility]  Past Surgical History:  Procedure Laterality Date   APPENDECTOMY     BRONCHIAL WASHINGS  05/18/2020   Procedure: BRONCHIAL WASHINGS;  Surgeon: EllMargaretha SeedsD;  [facility] [place];;   CARDIAC CATHETERIZATION     PT DENIES   COLONOSCOPY     2011,2015,2021   COLONOSCOPY WITH PROPOFOL N/A 07/10/2020   Procedure: COLONOSCOPY WITH PROPOFOL;  Surgeon: Toledo, TeoBenay PikeD;  Location: [facility] ENDOSCOPY;  Service: Gastroenterology;  Laterality: N/A;   CORONARY ARTERY BYPASS GRAFT     FLEXIBLE BRONCHOSCOPY N/A 04/24/2020   Procedure: FLEXIBLE BRONCHOSCOPY;  Surgeon: AleOttie GlazierD;  Location: [facility] ORS;  Service: Thoracic;  Laterality: N/A;   IR SINUS/FIST TUBE CHK-NON GI  10/27/2019   open heart surgery     PORTACATH PLACEMENT Right 10/13/2019   Procedure:  INSERTION PORT-A-CATH;  Surgeon: CinHerbert PunD;  Location: [facility] ORS;  Service: General;  Laterality: Right;   RIGHT/LEFT HEART CATH AND CORONARY ANGIOGRAPHY N/A 10/03/2020   Procedure: RIGHT/LEFT HEART CATH AND CORONARY ANGIOGRAPHY;  Surgeon: EndNelva BushD;  [facility];  Service: Cardiovascular;  Laterality: N/A;   ROBOT ASSISTED LAPAROSCOPIC PARTIAL COLECTOMY  09/2019   VIDEO BRONCHOSCOPY N/A 05/18/2020   Procedure: VIDEO BRONCHOSCOPY WITHOUT FLUORO;  Surgeon: EllMargaretha SeedsD;  [facility] [place];  Laterality: N/A;    Allergies  Allergies  Allergen Reactions   Ofev [Nintedanib] Diarrhea and Nausea Only    History of Present Illness    [age]ar old male with above past medical history including coronary disease status post CABG x4 in 2010, hypertension, hyperlipidemia, and rectal cancer.  Over the past year, he has had progressive dyspnea on exertion symptoms initially felt to be secondary to prior COVID infection.  Further work-up including CT chest, has shown interstitial lung disease.  He was evaluated in May 2022 by Dr. EndSaunders Revelth ongoing complaints of dyspnea and fatigue, which he identified his anginal equivalents.  In this setting, he underwent diagnostic catheterization on May 24 revealing severe, native multivessel disease with 3 of 3 patent grafts.  Right heart cath revealed elevated right heart pressures including a PA of 60/23 (35), and a wedge of 23 mmHg.  Lasix  40 mg daily was initiated for management of HFpEF.  At follow-up on June 3, he noted stable dyspnea on exertion without a significant difference in activity tolerance.  Weight was down 4 pounds since prior visit.  Lab work showed stable renal function and electrolytes and he was advised to continue the same dose of Lasix.  Since his last visit, he has been attending pulmonary rehab and tolerating well.  He continues to have dyspnea on exertion and is not sure he  seen an improvement yet but was recently placed on tyvaso, and is hopeful that his breathing will improve over the next few months.  From a cardiac standpoint, he has done well.  He has not had any chest pain.  His weight has been stable and he has not experienced any lower extremity edema.  He denies palpitations, PND, orthopnea, dizziness, syncope, or early satiety.  He has been tracking his blood pressures at home and typically runs in the 1 teens to 130 range.  Home Medications    Prior to Admission medications   Medication Sig Start Date End Date Taking? Authorizing Provider  aspirin EC 81 MG tablet Take 1 tablet (81 mg total) by mouth daily. Swallow whole. 09/27/20  Yes End, Harrell Gave, MD  atorvastatin (LIPITOR) 20 MG tablet Take 20 mg by mouth daily.   Yes [provider]  dextromethorphan-guaiFENesin (MUCINEX DM) 30-600 MG 12hr tablet Take 1 tablet by mouth 2 (two) times daily as needed for cough. 03/04/20  Yes Pokhrel, Laxman, MD  furosemide (LASIX) 40 MG tablet Take 1 tablet (40 mg total) by mouth daily. 10/03/20 10/03/21 Yes End, Harrell Gave, MD  metoprolol tartrate (LOPRESSOR) 25 MG tablet Take 25 mg by mouth 2 (two) times daily. 11/03/19  Yes [provider]  mometasone (ELOCON) 0.1 % cream Apply 1 application topically as directed. Qd to bid up to 5 days a week to aa psoriasis on arms until clear, then prn flares 10/05/20  Yes Ralene Bathe, MD  Nintedanib (OFEV) 150 MG CAPS Take 150 mg by mouth 2 (two) times daily.   Yes [provider]  predniSONE (DELTASONE) 5 MG tablet Take 1 tablet (5 mg total) by mouth daily with breakfast. 09/21/20  Yes Martyn Ehrich, NP  temazepam (RESTORIL) 30 MG capsule Take 30 mg by mouth at bedtime.  06/09/14  Yes [provider]  Treprostinil (TYVASO) 0.6 MG/ML SOLN Inhale 18 mcg into the lungs 4 (four) times daily.   Yes [provider]     Review of Systems    Chronic and persistent dyspnea on exertion  which has been stable.  He denies chest pain, dyspnea, palpitations, PND, orthopnea, dizziness, syncope, edema, or early satiety.  All other systems reviewed and are otherwise negative except as noted above.  Physical Exam    VS:  BP 126/74 (BP Location: Left Arm, Patient Position: Sitting, Cuff Size: Normal)   Pulse 86   Ht _0  (1.753 m)   Wt 222 lb (100.7 kg)   SpO2 95% Comment: 4 Liters of Oxygen  BMI 32.78 kg/m  , BMI Body mass index is 32.78 kg/m.     GEN: Well nourished, well developed, in no acute distress. HEENT: normal. Neck: Supple, no JVD, carotid bruits, or masses. Cardiac: RRR, no murmurs, rubs, or gallops. No clubbing, cyanosis, edema.  Radials 2+, PT 2+ and equal bilaterally.  Respiratory:  Respirations regular and unlabored, crackles about halfway up bilaterally. GI: Soft, nontender, nondistended, BS + x 4. MS:  no deformity or atrophy. Skin: warm and dry, no rash. Neuro:  Strength and sensation are intact. Psych: Normal affect.  Accessory Clinical Findings     Lab Results  Component Value Date   WBC 9.3 09/27/2020   HGB 14.2 09/27/2020   HCT 41.6 09/27/2020   MCV 102 (H) 09/27/2020   PLT 171 09/27/2020   Lab Results  Component Value Date   CREATININE 0.93 10/13/2020   BUN 8 10/13/2020   NA 143 10/13/2020   K 3.7 10/13/2020   CL 99 10/13/2020   CO2 27 10/13/2020   Lab Results  Component Value Date   ALT 39 09/21/2020   AST 34 09/21/2020   ALKPHOS 97 09/21/2020   BILITOT 0.5 09/21/2020    Assessment & Plan    1.  Coronary artery disease: Status post prior CABG in 2010 with catheterization in May 2022 revealing severe, native multivessel disease and 3 of 3 patent grafts.  He was noted to have elevated filling pressures and has been taking Lasix 40 mg daily with stable weight and no significant change in chronic dyspnea on exertion.  He has not been having any chest pain.  He remains on aspirin, statin, and beta-blocker therapy.  2.  Chronic heart  failure with preserved ejection fraction: EF 55 to 60% by echo earlier this year.  Right heart catheterization revealed elevated filling pressures prompting initiation of Lasix 40 mg daily.  Weight has been stable since his last visit and he does not have any significant edema.  He remains on oxygen at 4 L/min in the setting of interstitial lung disease and chronic hypoxic respiratory failure.  He is attending pulmonary rehab.  After his last visit, he trended his blood pressures at home which ranged from the 1 teens to 130s.  I will continue his current regimen which includes beta-blocker and Lasix.  3.  Essential hypertension: Stable at 126/74 today with pressures trending in the 1 teens to 130s at home.  Continue current regimen.  4.  Hyperlipidemia: Remains on statin therapy with normal LFTs in May.  He says his primary care provider recently checked lipids and he was told that everything looked good.  I have asked for a copy of these.  5.  Chronic hypoxic respiratory failure on home O2/interstitial lung disease: Followed by pulmonology and now on Tyvaso.  He continues to participate in pulmonary rehab 3 times a week.  6.  Disposition: Follow-up in clinic in 3 months or sooner if necessary.   [name], NP 11/24/2020, 10:15 AM

## 2020-11-24 NOTE — Patient Instructions (Signed)
Medication Instructions:  No changes at this time.   *If you need a refill on your cardiac medications before your next appointment, please call your pharmacy*   Lab Work: None  If you have labs (blood work) drawn today and your tests are completely normal, you will receive your results only by: [place] (if you have MyChart) OR A paper copy in the mail If you have any lab test that is abnormal or we need to change your treatment, we will call you to review the results.   Testing/Procedures: None   Follow-Up: At [facility], you and your health needs are our priority.  As part of our continuing mission to provide you with exceptional heart care, we have created designated Provider Care Teams.  These Care Teams include your primary Cardiologist (physician) and Advanced Practice Providers (APPs -  Physician Assistants and Nurse Practitioners) who all work together to provide you with the care you need, when you need it.   Your next appointment:   3 month(s)  The format for your next appointment:   In Person  Provider:   Nelva Bush, MD or [name], NP

## 2020-11-28 ENCOUNTER — Encounter: Payer: Medicare Other | Admitting: *Deleted

## 2020-11-28 ENCOUNTER — Other Ambulatory Visit: Payer: Self-pay

## 2020-11-28 DIAGNOSIS — J849 Interstitial pulmonary disease, unspecified: Secondary | ICD-10-CM

## 2020-11-28 DIAGNOSIS — J9611 Chronic respiratory failure with hypoxia: Secondary | ICD-10-CM

## 2020-11-28 DIAGNOSIS — J84112 Idiopathic pulmonary fibrosis: Secondary | ICD-10-CM | POA: Diagnosis not present

## 2020-11-28 NOTE — Progress Notes (Signed)
Daily Session Note  Patient Details  Name: Garrett Peterson MRN: [id number] Date of Birth: Aug 14, 1945 Referring Provider:   Flowsheet Row Pulmonary Rehab from 08/10/2020 in [facility] Cardiac and Pulmonary Rehab  Referring Provider Brand Males MD       Encounter Date: 11/28/2020  Check In:  Session Check In - 11/28/20 0917       Check-In   Supervising physician immediately available to respond to emergencies See telemetry face sheet for immediately available ER MD    Location [facility]-Cardiac & Pulmonary Rehab    Staff Present Birdie Sons, MPA, RN;Afnan Cadiente Luan Pulling, MA, RCEP, CCRP, CCET;Laureen [place], BS, RRT, CPFT;Kara Eliezer Bottom, MS, ASCM CEP, Exercise Physiologist;Susanne Bice, RN, BSN, CCRP    Virtual Visit No    Medication changes reported     No    Fall or balance concerns reported    No    Warm-up and Cool-down Performed on first and last piece of equipment    Resistance Training Performed Yes    VAD Patient? No    PAD/SET Patient? No      Pain Assessment   Currently in Pain? No/denies                Social History   Tobacco Use  Smoking Status Never  Smokeless Tobacco Former   Types: Chew    Goals Met:  Proper associated with RPD/PD & O2 Sat Independence with exercise equipment Using PLB without cueing & demonstrates good technique Exercise tolerated well No report of cardiac concerns or symptoms Strength training completed today  Goals Unmet:  Not Applicable  Comments: Pt able to follow exercise prescription today without complaint.  Will continue to monitor for progression.    Dr. Emily Filbert is Medical Director for [place].  Dr. Ottie Glazier is Medical Director for [facility] Pulmonary Rehabilitation.

## 2020-11-29 NOTE — Telephone Encounter (Addendum)
Patient had first nursing visit for Tyvaso training on 11/20/20. Added Tyvaso to his medication list today  He has PFT and f/u OV with Dr. Chase Caller on 12/19/20  [name], PharmD, MPH, BCPS Clinical Pharmacist (Rheumatology and Pulmonology)

## 2020-11-30 ENCOUNTER — Encounter: Payer: Medicare Other | Admitting: *Deleted

## 2020-11-30 ENCOUNTER — Other Ambulatory Visit: Payer: Self-pay

## 2020-11-30 DIAGNOSIS — J84112 Idiopathic pulmonary fibrosis: Secondary | ICD-10-CM | POA: Diagnosis not present

## 2020-11-30 DIAGNOSIS — J849 Interstitial pulmonary disease, unspecified: Secondary | ICD-10-CM

## 2020-11-30 NOTE — Progress Notes (Signed)
Daily Session Note  Patient Details  Name: Garrett Peterson MRN: [id number] Date of Birth: 01-06-1946 Referring Provider:   Flowsheet Row Pulmonary Rehab from 08/10/2020 in [facility] Cardiac and Pulmonary Rehab  Referring Provider Brand Males MD       Encounter Date: 11/30/2020  Check In:  Session Check In - 11/30/20 0936       Check-In   Supervising physician immediately available to respond to emergencies See telemetry face sheet for immediately available ER MD    Location [facility]-Cardiac & Pulmonary Rehab    Staff Present Heath Lark, RN, BSN, Laveda Norman, BS, ACSM CEP, Exercise Physiologist;Amanda Oletta Darter, [place], ACSM CEP, Exercise Physiologist    Virtual Visit No    Medication changes reported     No    Fall or balance concerns reported    No    Warm-up and Cool-down Performed on first and last piece of equipment    Resistance Training Performed Yes    VAD Patient? No    PAD/SET Patient? No      Pain Assessment   Currently in Pain? No/denies                Social History   Tobacco Use  Smoking Status Never  Smokeless Tobacco Former   Types: Chew    Goals Met:  Proper associated with RPD/PD & O2 Sat Independence with exercise equipment Exercise tolerated well No report of cardiac concerns or symptoms  Goals Unmet:  Not Applicable  Comments: Pt able to follow exercise prescription today without complaint.  Will continue to monitor for progression.    Dr. Emily Filbert is Medical Director for [place].  Dr. Ottie Glazier is Medical Director for [facility] Pulmonary Rehabilitation.

## 2020-12-05 ENCOUNTER — Other Ambulatory Visit: Payer: Self-pay

## 2020-12-05 DIAGNOSIS — J9611 Chronic respiratory failure with hypoxia: Secondary | ICD-10-CM

## 2020-12-05 DIAGNOSIS — J84112 Idiopathic pulmonary fibrosis: Secondary | ICD-10-CM | POA: Diagnosis not present

## 2020-12-05 DIAGNOSIS — J849 Interstitial pulmonary disease, unspecified: Secondary | ICD-10-CM

## 2020-12-05 NOTE — Progress Notes (Signed)
Daily Session Note  Patient Details  Name: Garrett Peterson MRN: [id number] Date of Birth: 1946-02-20 Referring Provider:   Flowsheet Row Pulmonary Rehab from 08/10/2020 in [facility] Cardiac and Pulmonary Rehab  Referring Provider Brand Males MD       Encounter Date: 12/05/2020  Check In:  Session Check In - 12/05/20 0915       Check-In   Supervising physician immediately available to respond to emergencies See telemetry face sheet for immediately available ER MD    Location [facility]-Cardiac & Pulmonary Rehab    Staff Present Birdie Sons, MPA, RN;Jessica Luan Pulling, MA, RCEP, CCRP, CCET;Amanda Sommer, BA, ACSM CEP, Exercise Physiologist    Virtual Visit No    Medication changes reported     No    Fall or balance concerns reported    No    Warm-up and Cool-down Performed on first and last piece of equipment    Resistance Training Performed Yes    VAD Patient? No    PAD/SET Patient? No      Pain Assessment   Currently in Pain? No/denies                Social History   Tobacco Use  Smoking Status Never  Smokeless Tobacco Former   Types: Chew    Goals Met:  Independence with exercise equipment Exercise tolerated well No report of cardiac concerns or symptoms Strength training completed today  Goals Unmet:  Not Applicable  Comments: Pt able to follow exercise prescription today without complaint.  Will continue to monitor for progression.    Dr. Emily Filbert is Medical Director for [place].  Dr. Ottie Glazier is Medical Director for [facility] Pulmonary Rehabilitation.

## 2020-12-07 ENCOUNTER — Other Ambulatory Visit: Payer: Self-pay

## 2020-12-07 DIAGNOSIS — J84112 Idiopathic pulmonary fibrosis: Secondary | ICD-10-CM | POA: Diagnosis not present

## 2020-12-07 DIAGNOSIS — J849 Interstitial pulmonary disease, unspecified: Secondary | ICD-10-CM

## 2020-12-07 NOTE — Progress Notes (Signed)
Daily Session Note  Patient Details  Name: Garrett Peterson MRN: [id number] Date of Birth: 1946-03-28 Referring Provider:   Flowsheet Row Pulmonary Rehab from 08/10/2020 in [facility] Cardiac and Pulmonary Rehab  Referring Provider Brand Males MD       Encounter Date: 12/07/2020  Check In:  Session Check In - 12/07/20 0927       Check-In   Supervising physician immediately available to respond to emergencies See telemetry face sheet for immediately available ER MD    Location [facility]-Cardiac & Pulmonary Rehab    Staff Present Earlean Shawl, BS, ACSM CEP, Exercise Physiologist;Amanda Oletta Darter, BA, ACSM CEP, Exercise Physiologist    Virtual Visit No    Medication changes reported     No    Fall or balance concerns reported    No    Warm-up and Cool-down Performed on first and last piece of equipment    Resistance Training Performed Yes    VAD Patient? No    PAD/SET Patient? No      Pain Assessment   Currently in Pain? No/denies                Social History   Tobacco Use  Smoking Status Never  Smokeless Tobacco Former   Types: Chew    Goals Met:  Independence with exercise equipment Exercise tolerated well No report of cardiac concerns or symptoms Strength training completed today  Goals Unmet:  Not Applicable  Comments: Pt able to follow exercise prescription today without complaint.  Will continue to monitor for progression.    Dr. Emily Filbert is Medical Director for [place].  Dr. Ottie Glazier is Medical Director for [facility] Pulmonary Rehabilitation.

## 2020-12-11 ENCOUNTER — Other Ambulatory Visit: Payer: Self-pay | Admitting: Primary Care

## 2020-12-12 ENCOUNTER — Encounter: Payer: Medicare Other | Attending: Internal Medicine | Admitting: *Deleted

## 2020-12-12 ENCOUNTER — Other Ambulatory Visit: Payer: Self-pay

## 2020-12-12 DIAGNOSIS — J9611 Chronic respiratory failure with hypoxia: Secondary | ICD-10-CM | POA: Insufficient documentation

## 2020-12-12 DIAGNOSIS — J849 Interstitial pulmonary disease, unspecified: Secondary | ICD-10-CM | POA: Diagnosis not present

## 2020-12-12 NOTE — Progress Notes (Signed)
Daily Session Note  Patient Details  Name: RENWICK ASMAN MRN: [id number] Date of Birth: 1946/05/05 Referring Provider:   Flowsheet Row Pulmonary Rehab from 08/10/2020 in [facility] Cardiac and Pulmonary Rehab  Referring Provider Brand Males MD       Encounter Date: 12/12/2020  Check In:  Session Check In - 12/12/20 0922       Check-In   Supervising physician immediately available to respond to emergencies See telemetry face sheet for immediately available ER MD    Location [facility]-Cardiac & Pulmonary Rehab    Staff Present Heath Lark, RN, BSN, CCRP;Kelly Bollinger, MPA, RN;Amanda Sommer, BA, ACSM CEP, Exercise Physiologist    Virtual Visit No    Medication changes reported     No    Fall or balance concerns reported    No    Warm-up and Cool-down Performed on first and last piece of equipment    Resistance Training Performed Yes    VAD Patient? No    PAD/SET Patient? No      Pain Assessment   Currently in Pain? No/denies                Social History   Tobacco Use  Smoking Status Never  Smokeless Tobacco Former   Types: Chew    Goals Met:  Proper associated with RPD/PD & O2 Sat Independence with exercise equipment Exercise tolerated well No report of cardiac concerns or symptoms  Goals Unmet:  Not Applicable  Comments: Pt able to follow exercise prescription today without complaint.  Will continue to monitor for progression.    Dr. Emily Filbert is Medical Director for [place].  Dr. Ottie Glazier is Medical Director for [facility] Pulmonary Rehabilitation.

## 2020-12-14 ENCOUNTER — Other Ambulatory Visit: Payer: Self-pay

## 2020-12-14 ENCOUNTER — Encounter: Payer: Medicare Other | Admitting: *Deleted

## 2020-12-14 DIAGNOSIS — J849 Interstitial pulmonary disease, unspecified: Secondary | ICD-10-CM

## 2020-12-14 DIAGNOSIS — J9611 Chronic respiratory failure with hypoxia: Secondary | ICD-10-CM

## 2020-12-14 NOTE — Progress Notes (Signed)
Daily Session Note  Patient Details  Name: Garrett Peterson MRN: [id number] Date of Birth: [birth year] Referring Provider:   Flowsheet Row Pulmonary Rehab from 08/10/2020 in [facility] Cardiac and Pulmonary Rehab  Referring Provider Brand Males MD       Encounter Date: 12/14/2020  Check In:  Session Check In - 12/14/20 1008       Check-In   Supervising physician immediately available to respond to emergencies See telemetry face sheet for immediately available ER MD    Location [facility]-Cardiac & Pulmonary Rehab    Staff Present Heath Lark, RN, BSN, Laveda Norman, BS, ACSM CEP, Exercise Physiologist;Amanda Oletta Darter, [place], ACSM CEP, Exercise Physiologist    Medication changes reported     No    Fall or balance concerns reported    No    Warm-up and Cool-down Performed on first and last piece of equipment    VAD Patient? No    PAD/SET Patient? No      Pain Assessment   Currently in Pain? No/denies                Social History   Tobacco Use  Smoking Status Never  Smokeless Tobacco Former   Types: Chew    Goals Met:  Proper associated with RPD/PD & O2 Sat Independence with exercise equipment Exercise tolerated well No report of cardiac concerns or symptoms  Goals Unmet:  Not Applicable  Comments: Pt able to follow exercise prescription today without complaint.  Will continue to monitor for progression.    Dr. Emily Filbert is Medical Director for [place].  Dr. Ottie Glazier is Medical Director for [facility] Pulmonary Rehabilitation.

## 2020-12-18 ENCOUNTER — Inpatient Hospital Stay: Payer: Medicare Other | Attending: Oncology

## 2020-12-18 ENCOUNTER — Other Ambulatory Visit: Payer: Self-pay | Admitting: Internal Medicine

## 2020-12-18 DIAGNOSIS — C20 Malignant neoplasm of rectum: Secondary | ICD-10-CM | POA: Insufficient documentation

## 2020-12-18 DIAGNOSIS — Z452 Encounter for adjustment and management of vascular access device: Secondary | ICD-10-CM | POA: Insufficient documentation

## 2020-12-18 DIAGNOSIS — Z95828 Presence of other vascular implants and grafts: Secondary | ICD-10-CM

## 2020-12-18 DIAGNOSIS — J84112 Idiopathic pulmonary fibrosis: Secondary | ICD-10-CM

## 2020-12-18 MED ORDER — HEPARIN SOD (PORK) LOCK FLUSH 100 UNIT/ML IV SOLN
500.0000 [IU] | Freq: Once | INTRAVENOUS | Status: AC
  Administered 2020-12-18: 500 [IU] via INTRAVENOUS
  Filled 2020-12-18: qty 5

## 2020-12-18 MED ORDER — HEPARIN SOD (PORK) LOCK FLUSH 100 UNIT/ML IV SOLN
INTRAVENOUS | Status: AC
  Filled 2020-12-18: qty 5

## 2020-12-18 MED ORDER — SODIUM CHLORIDE 0.9% FLUSH
10.0000 mL | INTRAVENOUS | Status: DC | PRN
  Administered 2020-12-18: 10 mL via INTRAVENOUS
  Filled 2020-12-18: qty 10

## 2020-12-18 NOTE — Progress Notes (Signed)
Pft   

## 2020-12-19 ENCOUNTER — Encounter: Payer: Self-pay | Admitting: Internal Medicine

## 2020-12-19 ENCOUNTER — Ambulatory Visit (INDEPENDENT_AMBULATORY_CARE_PROVIDER_SITE_OTHER): Payer: Medicare Other | Admitting: Internal Medicine

## 2020-12-19 ENCOUNTER — Telehealth: Payer: Self-pay | Admitting: Pharmacist

## 2020-12-19 ENCOUNTER — Other Ambulatory Visit: Payer: Self-pay

## 2020-12-19 VITALS — BP 128/80 | HR 67 | Temp 98.1°F | Ht 68.0 in | Wt 222.4 lb

## 2020-12-19 DIAGNOSIS — I2723 Pulmonary hypertension due to lung diseases and hypoxia: Secondary | ICD-10-CM | POA: Diagnosis not present

## 2020-12-19 DIAGNOSIS — Z79899 Other long term (current) drug therapy: Secondary | ICD-10-CM

## 2020-12-19 DIAGNOSIS — J84112 Idiopathic pulmonary fibrosis: Secondary | ICD-10-CM

## 2020-12-19 DIAGNOSIS — J9611 Chronic respiratory failure with hypoxia: Secondary | ICD-10-CM

## 2020-12-19 DIAGNOSIS — R0982 Postnasal drip: Secondary | ICD-10-CM

## 2020-12-19 DIAGNOSIS — Z5181 Encounter for therapeutic drug level monitoring: Secondary | ICD-10-CM

## 2020-12-19 LAB — PULMONARY FUNCTION TEST
DL/VA % pred: 91 %
DL/VA: 3.64 ml/min/mmHg/L
DLCO cor % pred: 55 %
DLCO cor: 13.12 ml/min/mmHg
DLCO unc % pred: 55 %
DLCO unc: 13.12 ml/min/mmHg
FEF 25-75 Pre: 1.95 L/sec
FEF2575-%Pred-Pre: 98 %
FEV1-%Pred-Pre: 47 %
FEV1-Pre: 1.31 L
FEV1FVC-%Pred-Pre: 118 %
FEV6-%Pred-Pre: 42 %
FEV6-Pre: 1.54 L
FEV6FVC-%Pred-Pre: 107 %
FVC-%Pred-Pre: 39 %
FVC-Pre: 1.54 L
Pre FEV1/FVC ratio: 85 %
Pre FEV6/FVC Ratio: 100 %

## 2020-12-19 NOTE — Patient Instructions (Addendum)
ICD-10-CM   1. IPF (idiopathic pulmonary fibrosis) ([place])  J84.112   2. Chronic hypoxemic respiratory failure (HCC)  J96.11   3. WHO group 3 pulmonary arterial hypertension (HCC)  I27.23    Overall stable clinicallly PFT with 4% decline in vital capacity but stable diffusion Too bad you could not tolerate ofev Glad Tyvaso (startedd 4 weeks ago) working well for you  plan contnue o2 4L [place] and adjust to pulse ox goa > 88% with exertion Continue lasix Continue tyvaso and increase per protocol (currently 7 times each time at 4 times per day) List ofev as allergy Start esbriet per protocool [employer] Wort If you want to try pine pollen that is fine though I personally do not know much about it For nsala draiange  - can try cutting down humidifier or trying ssaline nasal spray   Followup Dr Chase Caller or APP - in 6 weeks to ensure esbiret start going well  - ILD clinic

## 2020-12-19 NOTE — Progress Notes (Signed)
Previous [facility] pulmonary encounter: 07/04/20- Dr. Chase Peterson  Referred to the ILD center for comprehensive evaluation by Dr. Rodman Peterson primary pulmonologist 75 year old male, never smoked.  Significant for chronic hypoxic respiratory failure, coronary artery disease, hypertension.  Patient of Dr. Chase Peterson. Started OFEV in April 2022.   Garrett Peterson 75 y.o. -very complicated story.  Details of the story is copied and pasted above.  Meeting him for the first time with his wife.  It appears that he was previously healthy as of April 2021.  Although CT scan of the chest at the time of my personal visualization showed presence of early ILD [CT abdomen 10 years prior in 2011 had no ILD].  He was diagnosed with anorectal cancer.  He was undergoing treatment at [facility].  Chemotherapy as above.  Then in the fall/winter 2020 when he started falling ill with respiratory illnesses.  This is all documented above.  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  CT scan of the chest by October 2021 started showing significant amount of groundglass opacities.  He only had ANA test, rheumatoid factor and angiotensin-converting enzyme.  This is all negative.  For serology.  Otherwise has not had any other serology.  He had hypersensitive pneumonitis panel this was negative.  This was at outside facility [facility] clinic pulmonology program.  He has had 2 hospitalizations the most recent one being in January.  It appears that he was treated with prednisone each time.  It is unclear to me as this to him and his wife if he has been on chronic prednisone but reading the notes it appears so.  Currently is just finished a taper starting at 60 mg/day in January and finished at 20 mg and stop.  He says since stopping yesterday or today he is beginning to feel a little bit worse.  Room air oxygen at rest was fine but when he desaturated after he walked more than 2 laps.  However he needed 5 L to correct.  Most recent  echocardiogram was normal.  Most recent lavage showed mostly neutrophils.  Again culture negative.  His most recent CT scan of the chest in February 2022 without contrast shows improvement in groundglass opacities but no fibrosis.  His serologies ANA negative and rheumatoid factor negative and angiotensin converting enzyme negative  Definitely earlier on in the year it was not consistent with UIP and suggested alternate diagnosis.  Unclear to me if he has probable UIP pattern at this point in time in February 2022.  We did not have time to do an extensive detailed ILD questionnaire.  It appears that his goals are to get better and also feel better at the same time control his cancer.  They are also wondering about prognosis.  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  He is currently looking into getting radiation locally.  Oncologist wants to do a colonoscopy.  She feels the recurrence is because all his chemo has been on hold because of respiratory issues.   SErology Dec 2021 - [name]  ANA Direct - [facility] Negative Negative   RA Latex Turbid. - [facility] 0.0 - 13.9 IU/mL <10.0   A.Fumigatus #1 Abs - [facility] Negative Negative  Micropolyspora faeni, IgG - [facility] Negative Negative  Thermoactinomyces vulgaris, IgG - [facility] Negative Negative  A. Pullulans Abs -  [facility] Negative Negative  Thermoact. Saccharii - [facility] Negative Negative  Pigeon Serum Abs - [facility] Negative Negative   Angio Convert Enzyme - [facility] 14 - 82 U/L 26   Walk tst 07/04/2020    94% - ra at rest -> walked 2 laps without desat and dropped to 86% in middle of 3rd lap. Then need 5L [place] to correct to walk 3 laps in office   CT chest 06/23/20   IMPRESSION: 1. 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 Fibrosis has progressed since 03/02/2020 chest CT with resolved consolidative opacities. 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 UIP is difficult to exclude but is less favored. Follow-up high-resolution chest CT suggested in 6-12 months. Findings are indeterminate for UIP per consensus guidelines: Diagnosis of Idiopathic Pulmonary Fibrosis: An Official ATS/ERS/JRS/ALAT Clinical Practice Guideline. [place], Iss 5, [phone], Jan 11 2017. 2. Stable mild cardiomegaly. 3. Aortic Atherosclerosis (ICD10-I70.0).     Electronically Signed   By: [name] M.D.   On: 06/24/2020 15:15      08/02/2020 Patient presents today for 1 month follow-up with spirometry/DLCO. Patient was discussed at ILD conference in March 2022, diagnosed with IPF. Recent exacerbation likely chemo related. Started on Ofev. Patient was referred for right heart cath and referred to pulmonary rehab.  He is currently on an extended prednisone taper. He started 109m prednisone dose today x 2 weeks. He is still taking Bactrim. Breathing has improved. He has not started antifibrotic medication yet, his wifes states that their insurance has improved OFEV medication.   PFT 08/02/2020 - FVC 1.61 (41%), FEV1 1.55 (55%), ratio 96, DLCOcor 12.27 (52%) 09/21/2020 Patient presents today for 1 month follow-up. Accompanied by his son. He stop pulmonary for a week or two since having bronchitis. Cough is better since taking mucinex. He is on 4L nasal cannula. He is having 6-7 loose stools a day. Diarrhea started when he went up to 2 tabs of Ofev. He has up coming hearth cath, date to be determined. Son reports some new leg swelling. Due for LFTs today.   HEart cth  10/03/20  Conclusions: Severe native coronary artery disease including 70% distal LMCA and mid LAD disease, sequential 100% ostial and 99% proximal LCx lesions, and chronic total occlusion of ostial RCA. Widely patent LIMA-LAD and SVG-rPDA. Patent SVG-OM2 with 40% stenosis in proximal/mid portion of SVG. Mildly elevated left heart filling pressure (LVEDP 20 mmHg, PCWP 23 mmHg). Moderately elevated right heart filling pressure (mean RAP 14 mmHg, LVEDP 20 mmHg). Moderate pulmonary hypertension (mean PAP 35 mmHg, PVR 2.6 WU). Mildly reduced cardiac output/index.   Recommendations: Aggressive secondary prevention of coronary artery disease. Initiate furosemide 40 mg PO daily for HFpEF.  BMP to be drawn when patient is seen for follow-up 10/13/2020. Ongoing management of pulmonary fibrosis and pulmonary hypertension per Dr. RChase Peterson   CNelva Bush MD [facility]      OV 10/18/2020  Subjective:  Patient ID: PJuliette Peterson male , DOB: 128-Apr-1947, age 75y.o. , MRN: [id number], [address] [id number] TAlbina Billet MD Patient Care Team: TAlbina Billet MD as PCP - General (Internal Medicine) End, CHarrell Gave MD as PCP - Cardiology (Cardiology) SClent Jacks RN as  Oncology Nurse Navigator Earlie Server, MD as Consulting Physician (Oncology)  This Provider for this visit: Treatment Team:  Attending Provider: Brand Males, MD  Type of visit: Telephone/Video Circumstance: COVID-19 national emergency Identification of patient FUE CERVENKA with [birth year] and MRN [id number] - 2 person identifier Risks: Risks, benefits, limitations of telephone visit explained. Patient understood and verbalized agreement to proceed Anyone else on call: just patient Patient location: [phone] This provider location: Provider home because provider is isolating due to covid   10/18/2020 -  followup IPF.   HPI ANSELMO REIHL 75 y.o. - Off ofev since  09/21/20 due to diarrrhea. He called 09/28/20 and we told him to stop ofev.  But now he tells me 10/18/2020 that for 3 weeks he is taking 150m bid. No diarrhea other than going to BR frequently but is tolerabl and small amounts only. Respiration wise he is stable. Starts getting worse after lunch. Still on 4 LNC o2. No change Not on active cancer Rx. Has been cleared. Had RHC- see ablve. Srated on new lasix and helped breathing somewhat. Is attending pulm rehab  Discussed Include in patients with ILD   - Right heart cath :  PVR > 3, PCWP </= 15, Pmap >/=  25 -  Patient needed to be able to walk 1023m 300 feet on a 43m84mlk test  - he does have exclusionary criteria - his PVR is < 3 and PCWP is  15 but he is interested in applying for tyvasos. Discussed side effect profile briefly       OV 12/19/2020  Subjective:  Patient ID: PhiJuliette Alcideale , DOB: [birth year]age [age]o. , MRN: [id number]: [id number] TatAlbina BilletD Patient Care Team: TatAlbina BilletD as PCP - General (Internal Medicine) End, ChrHarrell GaveD as PCP - Cardiology (Cardiology) StaClent JacksN as Oncology Nurse Navigator Yu,Earlie ServerD as Consulting Physician (Oncology)  This Provider for this visit: Treatment Team:  Attending Provider: RamBrand MalesD    12/19/2020 -   Chief Complaint  Patient presents with   Follow-up    PFT performed today.  Pt states that he has had a lot of postnasal drainage in the mornings. Pt states his breathing is about the same since last visit. Pt is on Tyvaso and states that has been working well.   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 -presents for follow-up with his family member.  Overall he stable.  Symptom scores are stable.  He had pulmonary function test.?  Quality of respiratory maneuver but his FVC shows 4% decline compared to 6 months ago of 5 months ago.  DLCO is stable.  He himself feels stable.  He is on inhaled treprostinil.  He feels inhaled treprostinil since early July 2022 is improving his shortness of breath.  He is using it 4 times daily and each time 7 times.  Plan is to escalate to close to 12 times each time.  He is not doing nintedanib.  He does not want to do nintedanib.  He rechallenged himself again for the second time and had side effects.  He feels is  because his GI motility is different after his anorectal cancer.  He wants to know if he can try any natural treatment.  He is particularly interested in pine pollen.  I told him that I do not know much about this medication but would be open to him trying it.  He also wanted to know what to do about sinus drainage which is the early in the morning for a few hours and is having to sneeze a lot.  His family member asked if he should turn down the humidifier which I said was okay.  Can also try saline nasal spray to clear it.  They wanted know about oxygen use.  He is using 4 L.  I advised him to use it continuously.  Also advised to adjusted for pulse ox goal greater than 88% with exertion.     SYMPTOM SCALE - ILD 07/04/2020  08/02/2020  12/19/2020 Tyvaso since early July 2022 Off ofev  O2 use 4L  4L 4L  Shortness of Breath 0 -> 5 scale with 5 being worst (score 6 If unable to do)    At rest 1 0 5  Simple tasks - showers, clothes change, eating, shaving 4 3 3.5  Household (dishes, doing bed, laundry) x x x  Shopping x x x  Walking level at own pace 3 3 x  Walking up Stairs _0 Total (30-36) Dyspnea Score 12 10 13.5  How bad is your cough? 0 0 2  How bad is your fatigue _1 How bad is nausea 0 0 0  How bad is vomiting?  0 0 0  How bad is  diarrhea? 0 0 1  How bad is anxiety? _2 How bad is depression 5 3 2.5     PFT  PFT Results Latest Ref Rng & Units 12/19/2020 08/02/2020  FVC-Pre L 1.54 1.61  FVC-Predicted Pre % 39 41  Pre FEV1/FVC % % 85 96  FEV1-Pre L 1.31 1.55  FEV1-Predicted Pre % 47 55  DLCO uncorrected ml/min/mmHg 13.12 12.41  DLCO UNC% % 55 52  DLCO corrected ml/min/mmHg 13.12 12.27  DLCO COR %Predicted % 55 52  DLVA Predicted % 91 103       has a past medical history of (HFpEF) heart failure with preserved ejection fraction ([place]), Actinic keratosis, Adenomatous polyp of colon, Arthritis, Basal cell carcinoma, CAD (coronary artery disease), Cancer ([place]) (2021), Hyperlipidemia, Hypertension, Interstitial lung disease ([place]), Pneumonia, Pre-diabetes, and Respiratory failure with hypoxia ([place]).   reports that he has never smoked. He has quit using smokeless tobacco.  His smokeless tobacco use included chew.  Past Surgical History:  Procedure Laterality Date   APPENDECTOMY     BRONCHIAL WASHINGS  05/18/2020   Procedure: BRONCHIAL WASHINGS;  Surgeon: Margaretha Seeds, MD;  Location: [facility];  Service: Pulmonary;;   CARDIAC CATHETERIZATION     PT DENIES   COLONOSCOPY     2011,2015,2021   COLONOSCOPY WITH PROPOFOL N/A 07/10/2020   Procedure: COLONOSCOPY WITH PROPOFOL;  Surgeon: Toledo, Benay Pike, MD;  Location: [facility];  Service: Gastroenterology;  Laterality: N/A;   CORONARY ARTERY BYPASS GRAFT     FLEXIBLE BRONCHOSCOPY N/A 04/24/2020   Procedure: FLEXIBLE BRONCHOSCOPY;  Surgeon: Ottie Glazier, MD;  Location: [facility];  Service: Thoracic;  Laterality: N/A;   IR SINUS/FIST TUBE CHK-NON GI  10/27/2019   open heart surgery     PORTACATH PLACEMENT Right 10/13/2019   Procedure: INSERTION PORT-A-CATH;  Surgeon:  Herbert Pun, MD;  Location: [facility];  Service: General;  Laterality: Right;   RIGHT/LEFT HEART CATH AND CORONARY ANGIOGRAPHY N/A 10/03/2020   Procedure: RIGHT/LEFT HEART CATH AND  CORONARY ANGIOGRAPHY;  Surgeon: Nelva Bush, MD;  Location: [facility];  Service: Cardiovascular;  Laterality: N/A;   ROBOT ASSISTED LAPAROSCOPIC PARTIAL COLECTOMY  09/2019   VIDEO BRONCHOSCOPY N/A 05/18/2020   Procedure: VIDEO BRONCHOSCOPY WITHOUT FLUORO;  Surgeon: Margaretha Seeds, MD;  Location: [facility];  Service: Pulmonary;  Laterality: N/A;    Allergies  Allergen Reactions   Ofev [Nintedanib] Diarrhea and Nausea Only    Immunization History  Administered Date(s) Administered   PFIZER(Purple Top)SARS-COV-2 Vaccination 06/09/2019, 06/30/2019, 01/18/2020   Zoster Recombinat (Shingrix) 01/12/2018, 03/21/2018    Family History  Problem Relation Age of Onset   Lung disease Father      Current Outpatient Medications:    aspirin EC 81 MG tablet, Take 1 tablet (81 mg total) by mouth daily. Swallow whole., Disp: , Rfl:    atorvastatin (LIPITOR) 20 MG tablet, Take 20 mg by mouth daily., Disp: , Rfl:    dextromethorphan-guaiFENesin (MUCINEX DM) 30-600 MG 12hr tablet, Take 1 tablet by mouth 2 (two) times daily as needed for cough., Disp: 60 tablet, Rfl: 0   furosemide (LASIX) 40 MG tablet, Take 1 tablet (40 mg total) by mouth daily., Disp: 30 tablet, Rfl: 5   metoprolol tartrate (LOPRESSOR) 25 MG tablet, Take 25 mg by mouth 2 (two) times daily., Disp: , Rfl:    mometasone (ELOCON) 0.1 % cream, Apply 1 application topically as directed. Qd to bid up to 5 days a week to aa psoriasis on arms until clear, then prn flares, Disp: 45 g, Rfl: 0   predniSONE (DELTASONE) 5 MG tablet, TAKE 1 TABLET BY MOUTH DAILY WITH BREAKFAST, Disp: 30 tablet, Rfl: 2   temazepam (RESTORIL) 30 MG capsule, Take 30 mg by mouth at bedtime. , Disp: , Rfl:    Treprostinil (TYVASO) 0.6 MG/ML SOLN, Inhale into the lungs 4 (four) times daily., Disp: , Rfl:       Objective:   Vitals:   12/19/20 1040  BP: 128/80  Pulse: 67  Temp: 98.1 F (36.7 C)  TempSrc: Oral  SpO2: 99%  Weight: 222 lb 6.4 oz  (100.9 kg)  Height: _0  (1.727 m)    Estimated body mass index is 33.82 kg/m as calculated from the following:   Height as of this encounter: _1  (1.727 m).   Weight as of this encounter: 222 lb 6.4 oz (100.9 kg).  _2 @  Filed Weights   12/19/20 1040  Weight: 222 lb 6.4 oz (100.9 kg)     Physical Exam  General: No distress.looks well. Siting on whelc air Neuro: Alert and Oriented x 3. GCS 15. Speech normal Psych: Pleasant Resp:  Barrel Chest - no.  Wheeze - no, Crackles - yes, No overt respiratory distress CVS: Normal heart sounds. Murmurs - no Ext: Stigmata of Connective Tissue Disease - no HEENT: Normal upper airway. PEERL +. No post nasal drip        Assessment:       ICD-10-CM   1. Chronic hypoxemic respiratory failure (HCC)  J96.11     2. IPF (idiopathic pulmonary fibrosis) ([place])  J84.112     3. WHO group 3 pulmonary arterial hypertension (HCC)  I27.23     4. Medication management  Z79.899     5. Post-nasal drip  R09.82  Plan:     Patient Instructions     ICD-10-CM   1. IPF (idiopathic pulmonary fibrosis) ([place])  J84.112   2. Chronic hypoxemic respiratory failure (HCC)  J96.11   3. WHO group 3 pulmonary arterial hypertension (HCC)  I27.23    Overall stable clinicallly PFT with 4% decline in vital capacity but stable diffusion Too bad you could not tolerate ofev Glad Tyvaso (startedd 4 weeks ago) working well for you  plan contnue o2 4L [place] and adjust to pulse ox goa > 88% with exertion Continue lasix Continue tyvaso and increase per protocol (currently 7 times each time at 4 times per day) List ofev as allergy Start esbriet per protocool [employer] Wort If you want to try pine pollen that is fine though I personally do not know much about it For nsala draiange  - can try cutting down humidifier or trying ssaline nasal spray   Followup Dr Garrett Peterson or APP - in 6 weeks to ensure esbiret start going well  - ILD  clinic   ( Level 05 visit: Estb 40-54 min   in  visit type: on-site physical face to visit  in total care time and counseling or/and coordination of care by this undersigned MD - Dr Brand Males. This includes one or more of the following on this same day 12/19/2020: pre-charting, chart review, note writing, documentation discussion of test results, diagnostic or treatment recommendations, prognosis, risks and benefits of management options, instructions, education, compliance or risk-factor reduction. It excludes time spent by the [place] or office staff in the care of the patient. Actual time40 min)    SIGNATURE    Dr. Brand Males, M.D., F.C.C.P,  Pulmonary and Critical Care Medicine Staff Physician, [place] Director - Interstitial Lung Disease  Program  Pulmonary [place] at [place], [place], [address]  Pager: [phone], If no answer or between  15:00h - 7:00h: call [phone] Telephone: [phone]  11:22 AM 12/19/2020

## 2020-12-19 NOTE — Telephone Encounter (Signed)
Please start Esbriet BIV.  Dose with 249m tabs: Take 1 tab by mouth three times daily for 7 days, then take 2 tabs by mouth three times daily for 7 days, then take 3 tabs by mouth three times daily thereafter.  Dx: IPF  Previously tried therapies: Ofev - diarrhea and nausea  [employer] patient assistance paperwork completed [facility] [facility]. RDenyse Amass PharmD, MPH, BCPS Clinical Pharmacist (Rheumatology and Pulmonology)

## 2020-12-19 NOTE — Progress Notes (Signed)
Spirometry and Dlco done today.

## 2020-12-20 ENCOUNTER — Encounter: Payer: Self-pay | Admitting: *Deleted

## 2020-12-20 ENCOUNTER — Encounter: Payer: Self-pay | Admitting: Oncology

## 2020-12-20 ENCOUNTER — Other Ambulatory Visit (HOSPITAL_COMMUNITY): Payer: Self-pay

## 2020-12-20 DIAGNOSIS — J849 Interstitial pulmonary disease, unspecified: Secondary | ICD-10-CM

## 2020-12-20 NOTE — Telephone Encounter (Signed)
Submitted a Prior Authorization request to [facility] for PIRFENIDONE via CoverMyMeds. Will update once we receive a response.   KeyValere Dross - PA Case ID: [id number]

## 2020-12-20 NOTE — Telephone Encounter (Signed)
Received notification from [facility] regarding a prior authorization for PIRFENIDONE. Authorization has been APPROVED from 12/20/20 to 05/12/21.   Authorization # [id number]  Ran a test claim, patient's copay for 1 month is $684.79.  [employer] form is pending provider signature.

## 2020-12-20 NOTE — Telephone Encounter (Signed)
Submitted Patient Assistance Application to [place] for [place] along with provider portion. Will update patient when we receive a response.  Fax# [phone] Phone# [phone]

## 2020-12-20 NOTE — Progress Notes (Signed)
Pulmonary Individual Treatment Plan  Patient Details  Name: Garrett Peterson MRN: [id number] Date of Birth: [birth year] Referring Provider:   Flowsheet Row Pulmonary Rehab from 08/10/2020 in [facility] Cardiac and Pulmonary Rehab  Referring Provider Brand Males MD       Initial Encounter Date:  Flowsheet Row Pulmonary Rehab from 08/10/2020 in [facility] Cardiac and Pulmonary Rehab  Date 08/10/20       Visit Diagnosis: ILD (interstitial lung disease) ([place])  Patient's Home Medications on Admission:  Current Outpatient Medications:    aspirin EC 81 MG tablet, Take 1 tablet (81 mg total) by mouth daily. Swallow whole., Disp: , Rfl:    atorvastatin (LIPITOR) 20 MG tablet, Take 20 mg by mouth daily., Disp: , Rfl:    dextromethorphan-guaiFENesin (MUCINEX DM) 30-600 MG 12hr tablet, Take 1 tablet by mouth 2 (two) times daily as needed for cough., Disp: 60 tablet, Rfl: 0   furosemide (LASIX) 40 MG tablet, Take 1 tablet (40 mg total) by mouth daily., Disp: 30 tablet, Rfl: 5   metoprolol tartrate (LOPRESSOR) 25 MG tablet, Take 25 mg by mouth 2 (two) times daily., Disp: , Rfl:    mometasone (ELOCON) 0.1 % cream, Apply 1 application topically as directed. Qd to bid up to 5 days a week to aa psoriasis on arms until clear, then prn flares, Disp: 45 g, Rfl: 0   predniSONE (DELTASONE) 5 MG tablet, TAKE 1 TABLET BY MOUTH DAILY WITH BREAKFAST, Disp: 30 tablet, Rfl: 2   temazepam (RESTORIL) 30 MG capsule, Take 30 mg by mouth at bedtime. , Disp: , Rfl:    Treprostinil (TYVASO) 0.6 MG/ML SOLN, Inhale into the lungs 4 (four) times daily., Disp: , Rfl:   Past Medical History: Past Medical History:  Diagnosis Date   (HFpEF) heart failure with preserved ejection fraction ([place])    a. 05/2020 Echo: EF 55-60%, no rwma, Gr1 DD, mildly red RV fxn; b. 09/2020 RHC: PCWP 36mHg. CO/CI 4.7/2.1.   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 09/2020 Cath: LM 70d, LAD 70ost, 80/762mLCX 100ost/p, 99p/m, RCA 100ost/p, VG->OM2 4028mIMA->LAD nl, VG->RPDA nl-->Med Rx.   [place]   [place]   Hyperlipidemia    Hypertension    [place] lung [place]  Pneumonia    Pre-diabetes    Respiratory failure with hypoxia (HCC)     Tobacco Use: Social History   Tobacco Use  Smoking Status Never  Smokeless Tobacco Former   Types: Chew    Labs: Recent Review Flowsheet Data   There is no flowsheet data to display.      Pulmonary Assessment Scores:  Pulmonary Assessment Scores     Row Name 08/10/20 1004         ADL UCSD   ADL Phase Entry     SOB Score total 66     Rest 0     Walk 1     Stairs 4     Bath 1     Dress 1     Shop 3           CAT Score     CAT Score 18           mMRC Score     mMRC Score 4  UCSD: Self-administered rating of dyspnea associated with activities of daily living (ADLs) 6-point scale (0 = "not at all" to 5 = "maximal or unable to do because of breathlessness")  Scoring Scores range from 0 to 120.  Minimally important difference is 5 units  CAT: CAT can identify the health impairment of COPD patients and is better correlated with disease progression.  CAT has a scoring range of zero to 40. The CAT score is classified into four groups of low (less than 10), medium (10 - 20), high (21-30) and very high (31-40) based on the impact level of disease on health status. A CAT score over 10 suggests significant symptoms.  A worsening CAT score could be explained by an exacerbation, poor medication adherence, poor inhaler technique, or progression of COPD or comorbid conditions.  CAT MCID is 2 points  mMRC: mMRC (Modified Medical Research Council) Dyspnea Scale is used to assess the degree of baseline functional disability in patients of respiratory disease due to dyspnea. No minimal important difference is  established. A decrease in score of 1 point or greater is considered a positive change.   Pulmonary Function Assessment:   Exercise Target Goals: Exercise Program Goal: Individual exercise prescription set using results from initial 6 min walk test and THRR while considering  patient's activity barriers and safety.   Exercise Prescription Goal: Initial exercise prescription builds to 30-45 minutes a day of aerobic activity, 2-3 days per week.  Home exercise guidelines will be given to patient during program as part of exercise prescription that the participant will acknowledge.  Education: Aerobic Exercise: - Group verbal and visual presentation on the components of exercise prescription. Introduces F.I.T.T principle from ACSM for exercise prescriptions.  Reviews F.I.T.T. principles of aerobic exercise including progression. Written material given at graduation. Flowsheet Row Pulmonary Rehab from 10/12/2020 in [facility] Cardiac and Pulmonary Rehab  Education need identified 08/10/20       Education: Resistance Exercise: - Group verbal and visual presentation on the components of exercise prescription. Introduces F.I.T.T principle from ACSM for exercise prescriptions  Reviews F.I.T.T. principles of resistance exercise including progression. Written material given at graduation.    Education: Exercise & Equipment Safety: - Individual verbal instruction and demonstration of equipment use and safety with use of the equipment. Flowsheet Row Pulmonary Rehab from 10/12/2020 in [facility] Cardiac and Pulmonary Rehab  Date 08/10/20  Educator [facility]  Instruction Review Code 1- Verbalizes Understanding       Education: Exercise Physiology & General Exercise Guidelines: - Group verbal and written instruction with models to review the exercise physiology of the cardiovascular system and associated critical values. Provides general exercise guidelines with specific guidelines to those with heart or lung disease.     Education: Flexibility, Balance, Mind/Body Relaxation: - Group verbal and visual presentation with interactive activity on the components of exercise prescription. Introduces F.I.T.T principle from ACSM for exercise prescriptions. Reviews F.I.T.T. principles of flexibility and balance exercise training including progression. Also discusses the mind body connection.  Reviews various relaxation techniques to help reduce and manage stress (i.e. Deep breathing, progressive muscle relaxation, and visualization). Balance handout provided to take home. Written material given at graduation.   Activity Barriers & Risk Stratification:  Activity Barriers & Cardiac Risk Stratification - 08/10/20 0954       Activity Barriers & Cardiac Risk Stratification   Activity Barriers Deconditioning;Shortness of Breath;Muscular Weakness;Balance Concerns             6 Minute Walk:  6 Minute Walk  Row Name 08/10/20 0952         6 Minute Walk   Phase Initial     Distance 400 feet     Walk Time 3 minutes  stopped due to desaturation     # of Rest Breaks 0     MPH 1.52     METS 0.66     RPE 13     Perceived Dyspnea  3     VO2 Peak 2.29     Symptoms Yes (comment)     Comments SOB, chest tightness 4/10     Resting HR 57 bpm     Resting BP 132/64     Resting Oxygen Saturation  98 %     Exercise Oxygen Saturation  during 6 min walk 79 %     Max Ex. HR 89 bpm     Max Ex. BP 148/74     2 Minute Post BP 136/70           Interval HR     1 Minute HR 74     2 Minute HR 84     3 Minute HR 89     2 Minute Post HR 71     Interval Heart Rate? Yes           Interval Oxygen     Interval Oxygen? Yes     Baseline Oxygen Saturation % 98 %     1 Minute Oxygen Saturation % 89 %     1 Minute Liters of Oxygen 4 L  continuous     2 Minute Oxygen Saturation % 83 %     2 Minute Liters of Oxygen 4 L     3 Minute Oxygen Saturation % 79 %     3 Minute Liters of Oxygen 4 L     2 Minute Post Oxygen  Saturation % 97 %     2 Minute Post Liters of Oxygen 4 L            Oxygen Initial Assessment:  Oxygen Initial Assessment - 08/10/20 1003       Home Oxygen   Home Oxygen Device E-Tanks    Sleep Oxygen Prescription Continuous    Liters per minute 4    Home Exercise Oxygen Prescription Continuous    Liters per minute 4    Home Resting Oxygen Prescription Continuous    Liters per minute 4    Compliance with Home Oxygen Use Yes      Initial 6 min Walk   Oxygen Used Continuous;E-Tanks    Liters per minute 4      Program Oxygen Prescription   Program Oxygen Prescription Continuous;E-Tanks    Liters per minute 4      Intervention   Short Term Goals To learn and exhibit compliance with exercise, home and travel O2 prescription;To learn and understand importance of monitoring SPO2 with pulse oximeter and demonstrate accurate use of the pulse oximeter.;To learn and understand importance of maintaining oxygen saturations>88%;To learn and demonstrate proper use of respiratory medications;To learn and demonstrate proper pursed lip breathing techniques or other breathing techniques.     Long  Term Goals Exhibits compliance with exercise, home  and travel O2 prescription;Verbalizes importance of monitoring SPO2 with pulse oximeter and return demonstration;Maintenance of O2 saturations>88%;Exhibits proper breathing techniques, such as pursed lip breathing or other method taught during program session;Compliance with respiratory medication;Demonstrates proper use of MDI's  Oxygen Re-Evaluation:  Oxygen Re-Evaluation     Row Name 08/14/20 4742 09/26/20 0942 10/17/20 1042 11/16/20 0923 12/05/20 0929     Program Oxygen Prescription   Program Oxygen Prescription Continuous;E-Tanks Continuous;E-Tanks Continuous;E-Tanks Continuous;E-Tanks Continuous;E-Tanks   Liters per minute _0 Home Oxygen   Home Oxygen Device E-Tanks Home Concentrator;E-Tanks Home  Concentrator;E-Tanks Home Concentrator;E-Tanks Home Concentrator;E-Tanks   Sleep Oxygen Prescription _1    Liters per minute _2 Home Exercise Oxygen Prescription _3    Liters per minute _4 Home Resting Oxygen Prescription _5    Liters per minute _6 Compliance with Home Oxygen Use _7      Goals/Expected Outcomes   Short Term Goals To learn and demonstrate proper pursed lip breathing techniques or other breathing techniques.  Other Other To learn and understand importance of monitoring SPO2 with pulse oximeter and demonstrate accurate use of the pulse oximeter.;To learn and understand importance of maintaining oxygen saturations>88%;To learn and exhibit compliance with exercise, home and travel O2 prescription;To learn and demonstrate proper pursed lip breathing techniques or other breathing techniques. ;To learn and demonstrate proper use of respiratory medications To learn and understand importance of monitoring SPO2 with pulse oximeter and demonstrate accurate use of the pulse oximeter.;To learn and understand importance of maintaining oxygen saturations>88%;To learn and exhibit compliance with exercise, home and travel O2 prescription;To learn and demonstrate proper use of respiratory medications;To learn and demonstrate proper pursed lip breathing techniques or other breathing techniques.    Long  Term Goals Exhibits proper breathing techniques, such as pursed lip breathing or other method taught during program session Other Other Exhibits compliance with exercise, home  and travel O2 prescription;Verbalizes importance of monitoring SPO2 with pulse oximeter and return demonstration;Maintenance of O2 saturations>88%;Exhibits proper breathing techniques, such as pursed lip breathing or other method taught during  program session;Compliance with respiratory medication;Demonstrates proper use of MDI's Exhibits compliance with exercise, home  and travel O2 prescription;Verbalizes importance of monitoring SPO2 with pulse oximeter and return demonstration;Maintenance of O2 saturations>88%;Exhibits proper breathing techniques, such as pursed lip breathing or other method taught during program session;Compliance with respiratory medication;Demonstrates proper use of MDI's   Comments Reviewed PLB technique with pt.  Talked about how it works and it's importance in maintaining their exercise saturations. Patient has been sick with Bronchitis and is now ready to resume rehab. He wants to get his shortness of breath under control and work on his breathing techniques. Garrett Peterson is glad to be back feeling better and is determined to feel better.  He sees pulmonologist Friday and wants to be able to use 2L of oxygen. Garrett Peterson is doing well in rehab.  He is working on his breathing, still gets SOB when he reaches up overhead.  We talked more about using his PLB to help train breathing and getting insiprometer and flutter valve to help train his lungs too.  He really wants to get down to 2L. He reports tyvaso (5 breaths per day)  has been helping him breathe better. He feels he can breathe longer without giving out. Still having shortness of breath when reaching up overhead. He continues to practice his PLB which he feels helped, but not as much as the medication he started.   Goals/Expected Outcomes Short: Become more profiecient at using PLB.  Long: Become independent at using PLB. Short: continue rehab and work on breathing techniques. Long: reduce shortness of breath. Short:  get back to regular exercise Long:  be able to use 2L oxygen Short: Work on breathing techniques Long: Continue to work on breathing. Short: Work on breathing techniques Long: Continue to work on breathing.            Oxygen Discharge (Final Oxygen  Re-Evaluation):  Oxygen Re-Evaluation - 12/05/20 0929       Program Oxygen Prescription   Program Oxygen Prescription Continuous;E-Tanks    Liters per minute 4      Home Oxygen   Home Oxygen Device Home Concentrator;E-Tanks    Sleep Oxygen Prescription Continuous    Liters per minute 4    Home Exercise Oxygen Prescription Continuous    Liters per minute 4    Home Resting Oxygen Prescription Continuous    Liters per minute 4    Compliance with Home Oxygen Use Yes      Goals/Expected Outcomes   Short Term Goals To learn and understand importance of monitoring SPO2 with pulse oximeter and demonstrate accurate use of the pulse oximeter.;To learn and understand importance of maintaining oxygen saturations>88%;To learn and exhibit compliance with exercise, home and travel O2 prescription;To learn and demonstrate proper use of respiratory medications;To learn and demonstrate proper pursed lip breathing techniques or other breathing techniques.     Long  Term Goals Exhibits compliance with exercise, home  and travel O2 prescription;Verbalizes importance of monitoring SPO2 with pulse oximeter and return demonstration;Maintenance of O2 saturations>88%;Exhibits proper breathing techniques, such as pursed lip breathing or other method taught during program session;Compliance with respiratory medication;Demonstrates proper use of MDI's    Comments He reports tyvaso (5 breaths per day)  has been helping him breathe better. He feels he can breathe longer without giving out. Still having shortness of breath when reaching up overhead. He continues to practice his PLB which he feels helped, but not as much as the medication he started.    Goals/Expected Outcomes Short: Work on breathing techniques Long: Continue to work on breathing.             Initial Exercise Prescription:  Initial Exercise Prescription - 08/10/20 0900       Date of Initial Exercise RX and Referring Provider   Date 08/10/20     Referring Provider Brand Males MD      Oxygen   Oxygen Continuous    Liters 4      Treadmill   MPH 1.5    Grade 0    Minutes 15   2 min walk 1 min rest   METs 2.15      Recumbant Bike   Level 1    RPM 50    Watts 10    Minutes 15    METs 2      NuStep   Level 1    SPM 80    Minutes 15    METs 2      Prescription Details   Frequency (times per week) 3    Duration Progress to 30 minutes of continuous aerobic without signs/symptoms of physical distress      Intensity   THRR 40-80% of Max Heartrate 92-127    Ratings of Perceived Exertion 11-13    Perceived Dyspnea 0-4      Progression   Progression Continue to progress workloads to maintain intensity without signs/symptoms of physical distress.      Resistance Training  Training Prescription Yes    Weight 3 lb    Reps 10-15             Perform Capillary Blood Glucose checks as needed.  Exercise Prescription Changes:   Exercise Prescription Changes     Row Name 08/10/20 0900 08/21/20 0800 09/04/20 1500 10/02/20 1600 10/16/20 1100     Response to Exercise   Blood Pressure (Admit) 132/64 138/74 134/62 132/60 126/74   Blood Pressure (Exercise) 148/74 160/76 162/72 142/84 150/82   Blood Pressure (Exit) 124/72 122/60 122/64 142/70 132/72   Heart Rate (Admit) 57 bpm 59 bpm 69 bpm 60 bpm 78 bpm   Heart Rate (Exercise) 89 bpm 85 bpm 107 bpm 80 bpm 128 bpm   Heart Rate (Exit) 68 bpm 75 bpm 100 bpm 75 bpm 78 bpm   Oxygen Saturation (Admit) 98 % 95 % 98 % 96 % 91 %   Oxygen Saturation (Exercise) 79 % 86 % 89 % 87 % --   Oxygen Saturation (Exit) 95 % 95 % 90 % 96 % 98 %   Rating of Perceived Exertion (Exercise) _0 Perceived Dyspnea (Exercise) _1 0 --   Symptoms SOB, chest tightness 4/10 SOB SOB SOB SOB   Comments walk test results third day from last day attended 4/11 -- --   Duration -- Progress to 30 minutes of  aerobic without signs/symptoms of physical distress Progress to 30 minutes of   aerobic without signs/symptoms of physical distress Progress to 30 minutes of  aerobic without signs/symptoms of physical distress Progress to 30 minutes of  aerobic without signs/symptoms of physical distress   Intensity -- THRR unchanged THRR unchanged THRR unchanged THRR unchanged     Progression   Progression -- Continue to progress workloads to maintain intensity without signs/symptoms of physical distress. Continue to progress workloads to maintain intensity without signs/symptoms of physical distress. Continue to progress workloads to maintain intensity without signs/symptoms of physical distress. Continue to progress workloads to maintain intensity without signs/symptoms of physical distress.   Average METs -- 2.07 1.9 1.95 2     Resistance Training   Training Prescription -- Yes Yes Yes Yes   Weight -- 3 lb 3 lb 3 lb 3 lb   Reps -- 10-15 10-15 10-15 10-15     Interval Training   Interval Training -- -- No No No     Oxygen   Oxygen -- Continuous Continuous Continuous Continuous   Liters -- _2 Treadmill   MPH -- 1.5 -- -- --   Grade -- 0 -- -- --   Minutes -- 15 -- -- --   METs -- 2.15 -- -- --     Recumbant Bike   Level -- -- 1 -- --   Minutes -- -- 15 -- --     NuStep   Level -- 3 3 -- 3   Minutes -- 15 15 -- 15   METs -- 2 1.9 -- 2     T5 Nustep   Level -- -- -- 1 --   Minutes -- -- -- 15 --   METs -- -- -- 1.9 --     Biostep-RELP   Level -- -- -- 1 1   Minutes -- -- -- 15 15   METs -- -- -- 2 --    Row Name 10/31/20 1400 11/16/20 0700 11/29/20 1300 12/12/20 1200  Response to Exercise   Blood Pressure (Admit) 140/68 118/76 140/86 122/68    Blood Pressure (Exit) 110/62 112/64 128/60 122/72    Heart Rate (Admit) 66 bpm 78 bpm 76 bpm 70 bpm    Heart Rate (Exercise) 83 bpm 89 bpm 92 bpm 94 bpm    Heart Rate (Exit) 80 bpm 83 bpm 91 bpm 85 bpm    Oxygen Saturation (Admit) 97 % 94 % 98 % 97 %    Oxygen Saturation (Exercise) 88 % 91 % 91 % 93 %     Oxygen Saturation (Exit) 91 % 92 % 94 % 93 %    Rating of Perceived Exertion (Exercise) _0 Perceived Dyspnea (Exercise) _1 --    Symptoms SOB SOB SOB --    Duration Continue with 30 min of aerobic exercise without signs/symptoms of physical distress. Continue with 30 min of aerobic exercise without signs/symptoms of physical distress. Continue with 30 min of aerobic exercise without signs/symptoms of physical distress. Continue with 30 min of aerobic exercise without signs/symptoms of physical distress.    Intensity THRR unchanged THRR unchanged THRR unchanged THRR unchanged         Progression        Progression Continue to progress workloads to maintain intensity without signs/symptoms of physical distress. Continue to progress workloads to maintain intensity without signs/symptoms of physical distress. Continue to progress workloads to maintain intensity without signs/symptoms of physical distress. Continue to progress workloads to maintain intensity without signs/symptoms of physical distress.    Average METs 2.3 2.23 1.9 2         Resistance Training        Training Prescription Yes Yes Yes Yes    Weight 3 lb 3 lb 3 lb 3 lb    Reps 10-15 10-15 10-15 10-15         Interval Training        Interval Training No No No No         Oxygen        Oxygen Continuous Continuous Continuous Continuous    Liters _2 NuStep        Level 5 5 -- 3    Minutes 30 30 -- 30    METs 2.4 2.5 -- 2         REL-XR        Level -- -- 1 --    Minutes -- -- 30 --    METs -- -- 2.2 --         T5 Nustep        Level _3 --    Minutes _4 --    METs 1.6 1.9 1.6 --         Home Exercise Plan        Plans to continue exercise at Home (comment)  bike Home (comment)  bike Home (comment)  bike Home (comment)  bike    Frequency Add 2 additional days to program exercise sessions. Add 2 additional days to program exercise sessions. Add 2 additional days to program  exercise sessions. Add 2 additional days to program exercise sessions.    Initial Home Exercises Provided 10/17/20 10/17/20 10/17/20 10/17/20            Exercise Comments:   Exercise Comments     Row Name 08/14/20 [phone] 08/21/20 [id number]  Exercise Comments First full day of exercise!  Patient was oriented to gym and equipment including functions, settings, policies, and procedures.  Patient's individual exercise prescription and treatment plan were reviewed.  All starting workloads were established based on the results of the 6 minute walk test done at initial orientation visit.  The plan for exercise progression was also introduced and progression will be customized based on patient's performance and goals. Forgets to replace oxygen cannula after blowing nose.  Sats drop to low 80's               Exercise Goals and Review:   Exercise Goals     Row Name 08/10/20 1000             Exercise Goals   Increase Physical Activity Yes       Intervention Provide advice, education, support and counseling about physical activity/exercise needs.;Develop an individualized exercise prescription for aerobic and resistive training based on initial evaluation findings, risk stratification, comorbidities and participant's personal goals.       Expected Outcomes Short Term: Attend rehab on a regular basis to increase amount of physical activity.;Long Term: Add in home exercise to make exercise part of routine and to increase amount of physical activity.;Long Term: Exercising regularly at least 3-5 days a week.       Increase Strength and Stamina Yes       Intervention Develop an individualized exercise prescription for aerobic and resistive training based on initial evaluation findings, risk stratification, comorbidities and participant's personal goals.;Provide advice, education, support and counseling about physical activity/exercise needs.       Expected Outcomes Short Term: Increase  workloads from initial exercise prescription for resistance, speed, and METs.;Short Term: Perform resistance training exercises routinely during rehab and add in resistance training at home;Long Term: Improve cardiorespiratory fitness, muscular endurance and strength as measured by increased METs and functional capacity (6MWT)       Able to understand and use rate of perceived exertion (RPE) scale Yes       Intervention Provide education and explanation on how to use RPE scale       Expected Outcomes Short Term: Able to use RPE daily in rehab to express subjective intensity level;Long Term:  Able to use RPE to guide intensity level when exercising independently       Able to understand and use Dyspnea scale Yes       Intervention Provide education and explanation on how to use Dyspnea scale       Expected Outcomes Short Term: Able to use Dyspnea scale daily in rehab to express subjective sense of shortness of breath during exertion;Long Term: Able to use Dyspnea scale to guide intensity level when exercising independently       Knowledge and understanding of Target Heart Rate Range (THRR) Yes       Intervention Provide education and explanation of THRR including how the numbers were predicted and where they are located for reference       Expected Outcomes Short Term: Able to state/look up THRR;Short Term: Able to use daily as guideline for intensity in rehab;Long Term: Able to use THRR to govern intensity when exercising independently       Able to check pulse independently Yes       Intervention Provide education and demonstration on how to check pulse in carotid and radial arteries.;Review the importance of being able to check your own pulse for safety during independent exercise  Expected Outcomes Short Term: Able to explain why pulse checking is important during independent exercise;Long Term: Able to check pulse independently and accurately       Understanding of Exercise Prescription Yes        Intervention Provide education, explanation, and written materials on patient's individual exercise prescription       Expected Outcomes Short Term: Able to explain program exercise prescription;Long Term: Able to explain home exercise prescription to exercise independently                Exercise Goals Re-Evaluation :  Exercise Goals Re-Evaluation     Row Name 08/14/20 0810 08/21/20 0845 09/04/20 1542 09/26/20 0939 10/02/20 1558     Exercise Goal Re-Evaluation   Exercise Goals Review Able to understand and use rate of perceived exertion (RPE) scale;Knowledge and understanding of Target Heart Rate Range (THRR);Understanding of Exercise Prescription;Able to understand and use Dyspnea scale Increase Physical Activity;Increase Strength and Stamina -- Increase Physical Activity;Increase Strength and Stamina Increase Physical Activity;Increase Strength and Stamina;Understanding of Exercise Prescription   Comments Reviewed RPE and dyspnea scales, THR and program prescription with pt today.  Pt voiced understanding and was given a copy of goals to take home. Garrett Peterson has had some difficulty with exercise.  His oxygen has dropped to 86%.  Staff reviewed PLB and reminded him to keep cannula in place as it has not been at times during exercise. Out since last review He is back from being sick and now he is ready to get back to exercise. Garrett Peterson has attended twice since his return.  He will need to attend consistently again. We will start to increase workloads and monitor his progress.   Expected Outcomes Short: Use RPE daily to regulate intensity. Long: Follow program prescription in THR. Short: continue to work on PLB Long:  Build overall stamina -- Short: attend [facility] regularly. Long: maintain exercise regularly independently. Short: Begin to increase workloads Long: Continue to improve stamina    Row Name 10/16/20 1106 10/17/20 1029 10/31/20 1418 11/16/20 0755 11/16/20 0922     Exercise Goal  Re-Evaluation   Exercise Goals Review Increase Physical Activity;Increase Strength and Stamina Increase Physical Activity;Increase Strength and Stamina Increase Physical Activity;Increase Strength and Stamina;Understanding of Exercise Prescription Increase Physical Activity;Increase Strength and Stamina;Understanding of Exercise Prescription Increase Physical Activity;Increase Strength and Stamina;Understanding of Exercise Prescription   Comments Garrett Peterson has attended two times last week but missed the week before.  Staff have spoken with him about consistent attendance. Reviewed home exercise with pt today.  Pt plans to use bike at home and join the wellzone for exercise.  Reviewed THR, pulse, RPE, sign and symptoms, pulse oximetery and when to call 911 or MD.  Also discussed weather considerations and indoor options.  Pt voiced understanding. Garrett Peterson is doing well in rehab. He is up to level 5 on the NuStep.  We will continue to montior his progress. Garrett Peterson is working in appropriate RPE range at rehab. He continues to rest and use PLB if O2 drops below 88%. He is compliant about watching his saturations. Will continue to monitor Garrett Peterson is doing well in rehab.  He feels like he is doing well other than his breathing.  He is supposed to start a new med soon.  He is riding his bike for 30 min on his off days and using some weights from [place].   Expected Outcomes Short: attend at least 6 times per month Long:improve stamina Short: add one day of exercise at  home per week Long: improve SOB with ADLS Short: Increase hand weights Long: Conitnue to improve stamina Short: Increase load on T5 Long: Continue to increase overall MET level Short: Continue to add in exercise at home Long: Continue to improve stamina    Row Name 11/29/20 1319 12/05/20 0941 12/12/20 1300         Exercise Goal Re-Evaluation   Exercise Goals Review Increase Physical Activity;Increase Strength and Stamina;Understanding of Exercise Prescription Increase  Physical Activity;Increase Strength and Stamina;Understanding of Exercise Prescription Increase Physical Activity;Increase Strength and Stamina     Comments Garrett Peterson is doing well in rehab.  We moved him up to the XR from the steppers yesterday and he seemed to enjoy it.  He was able to do it the whole time.  We will continue to encourage him to use the treadmill more.  We will continue to monitor his progress. Garrett Peterson reports working on the stationary bike at home - 30 minutes most days not at rehab; reminded about having warm up and cool down. Rajan should be able to increase levels on machines - he has been at same level 4 weeks.  Staff will encourage increasing weights for strength work and continue to try walking.     Expected Outcomes Short: Continue to attempt treadmill  Long: Continue to improve stamina Short: Continue to attempt treadmill, continue home exercise, include resistance training.  Long: Continue to improve stamina Short: increase levels on seated machines Long:  improve overall stamina              Discharge Exercise Prescription (Final Exercise Prescription Changes):  Exercise Prescription Changes - 12/12/20 1200       Response to Exercise   Blood Pressure (Admit) 122/68    Blood Pressure (Exit) 122/72    Heart Rate (Admit) 70 bpm    Heart Rate (Exercise) 94 bpm    Heart Rate (Exit) 85 bpm    Oxygen Saturation (Admit) 97 %    Oxygen Saturation (Exercise) 93 %    Oxygen Saturation (Exit) 93 %    Rating of Perceived Exertion (Exercise) 11    Duration Continue with 30 min of aerobic exercise without signs/symptoms of physical distress.    Intensity THRR unchanged      Progression   Progression Continue to progress workloads to maintain intensity without signs/symptoms of physical distress.    Average METs 2      Resistance Training   Training Prescription Yes    Weight 3 lb    Reps 10-15      Interval Training   Interval Training No      Oxygen   Oxygen Continuous     Liters 4      NuStep   Level 3    Minutes 30    METs 2      Home Exercise Plan   Plans to continue exercise at Home (comment)   bike   Frequency Add 2 additional days to program exercise sessions.    Initial Home Exercises Provided 10/17/20             Nutrition:  Target Goals: Understanding of nutrition guidelines, daily intake of sodium <1510m, cholesterol <2038m calories 30% from fat and 7% or less from saturated fats, daily to have 5 or more servings of fruits and vegetables.  Education: All About Nutrition: -Group instruction provided by verbal, written material, interactive activities, discussions, models, and posters to present general guidelines for heart healthy nutrition including fat, fiber, MyPlate, the  role of sodium in heart healthy nutrition, utilization of the nutrition label, and utilization of this knowledge for meal planning. Follow up email sent as well. Written material given at graduation.   Biometrics:  Pre Biometrics - 08/10/20 1001       Pre Biometrics   Height 5' 8.9" (1.75 m)    Weight 228 lb (103.4 kg)    BMI (Calculated) 33.77    Single Leg Stand 5.4 seconds              Nutrition Therapy Plan and Nutrition Goals:  Nutrition Therapy & Goals - 08/16/20 1135       Nutrition Therapy   Diet Heart healthy, low Na    Drug/Food Interactions Statins/Certain Fruits    Protein (specify units) 80g    Fiber 30 grams    Whole Grain Foods 3 servings    Saturated Fats 12 max. grams    Fruits and Vegetables 8 servings/day    Sodium 1.5 grams      Personal Nutrition Goals   Nutrition Goal ST: try diabetes friendly desserts from handout, Add different nonstarchy vegetables to 5 or more meals per week LT:    Comments Breakfast: bacon, egg, and sausage sandwich or scrambled eggs or cornflakes (adds sugar and milk) Lunch: part of sandwich and crackers Dinner: meats with potatoes butter beans or corn, grilled cheese sandwich. Take out on the  weekends. Snacks: sweets. Drinks: water and soda (zero) and tea (sweet tea - mcdonalds) and coffee (black). Reports T2DM (lifestyle controlled - not sure what A1C is and does not check BG at home), high cholesterol (lipitor has been helping). He had open heart surgery in the past. Discussed heart healthy eating and pulmonary MNT. Garrett Peterson is interested in making changes.      Intervention Plan   Intervention Nutrition handout(s) given to patient.;Prescribe, educate and counsel regarding individualized specific dietary modifications aiming towards targeted core components such as weight, hypertension, lipid management, diabetes, heart failure and other comorbidities.    Expected Outcomes Short Term Goal: Understand basic principles of dietary content, such as calories, fat, sodium, cholesterol and nutrients.;Short Term Goal: A plan has been developed with personal nutrition goals set during dietitian appointment.;Long Term Goal: Adherence to prescribed nutrition plan.             Nutrition Assessments:  MEDIFICTS Score Key: ?70 Need to make dietary changes  40-70 Heart Healthy Diet ? 40 Therapeutic Level Cholesterol Diet  Flowsheet Row Pulmonary Rehab from 08/10/2020 in [facility] Card[facility] Pul[facility]hab  Picture Your Plate Total Score on Admission 55      Picture Your Plate Scores: <76 Unhealthy dietary pattern with much room for improvement. 41-50 Dietary pattern unlikely to meet recommendations for good health and room for improvement. 51-60 More healthful dietary pattern, with some room for improvement.  >60 Healthy dietary pattern, although there may be some specific behaviors that could be improved.   Nutrition Goals Re-Evaluation:  Nutrition Goals Re-Evaluation     Garrett Peterson Name 09/26/20 0936 10/17/20 0944 11/16/20 0932 12/05/20 0939       Goals   Current Weight 226 lb (102.5 kg) -- -- --    Nutrition Goal Lose more weight and make diet changes. -- Reduce sugar and small meals Sort:  Consider working on reducing sweets Long: Conitnue to eat healthy.    Comment He feels like he heats too much bread and he has sweets alot. He is eating ice cream, cookies and other sweets daily. He  is going to try to cute back on the sweets. Trayton states he eats pretty good. He has cut back some on sweets.  He will try to have only one sweet treat per day. Garrett Peterson eats fairly well.  We talked about eating less sweets and gassy foods.  Also review small meals to help with breathing. He continues to try to eat well normally Garrett Peterson reports that he has no problems with his nutrition at this time. He reports he hasn't reduced his sweets, but is not interested in change at this time. He wants to see the blood work his MD will do. Discussed importance of limiting added sugar and excess carbohydrates with breathing.    Expected Outcome Short: cut back on sweets. Long: limit sweets to a minimum. Short: continue to owrk on reducing sweets Long: one sweet treat per day Sort: Work on reducing sweets Long: Conitnue to eat healthy. Sort: Consider working on reducing sweets Long: Conitnue to eat healthy.             Nutrition Goals Discharge (Final Nutrition Goals Re-Evaluation):  Nutrition Goals Re-Evaluation - 12/05/20 0939       Goals   Nutrition Goal Sort: Consider working on reducing sweets Long: Conitnue to eat healthy.    Comment Garrett Peterson reports that he has no problems with his nutrition at this time. He reports he hasn't reduced his sweets, but is not interested in change at this time. He wants to see the blood work his MD will do. Discussed importance of limiting added sugar and excess carbohydrates with breathing.    Expected Outcome Sort: Consider working on reducing sweets Long: Conitnue to eat healthy.             Psychosocial: Target Goals: Acknowledge presence or absence of significant depression and/or stress, maximize coping skills, provide positive support system. Participant is able to  verbalize types and ability to use techniques and skills needed for reducing stress and depression.   Education: Stress, Anxiety, and Depression - Group verbal and visual presentation to define topics covered.  Reviews how body is impacted by stress, anxiety, and depression.  Also discusses healthy ways to reduce stress and to treat/manage anxiety and depression.  Written material given at graduation.   Education: Sleep Hygiene -Provides group verbal and written instruction about how sleep can affect your health.  Define sleep hygiene, discuss sleep cycles and impact of sleep habits. Review good sleep hygiene tips.    Initial Review & Psychosocial Screening:  Initial Psych Review & Screening - 08/04/20 1508       Initial Review   Current issues with Current Stress Concerns;Current Sleep Concerns    Source of Stress Concerns Unable to participate in former interests or hobbies;Unable to perform yard/household activities;Chronic Illness      Family Dynamics   Good Support System? Yes   God, wife, medical team     Barriers   Psychosocial barriers to participate in program There are no identifiable barriers or psychosocial needs.      Screening Interventions   Interventions Provide feedback about the scores to participant;Encouraged to exercise;To provide support and resources with identified psychosocial needs    Expected Outcomes Short Term goal: Utilizing psychosocial counselor, staff and physician to assist with identification of specific Stressors or current issues interfering with healing process. Setting desired goal for each stressor or current issue identified.;Long Term Goal: Stressors or current issues are controlled or eliminated.;Short Term goal: Identification and review with participant of any Quality  of Life or Depression concerns found by scoring the questionnaire.;Long Term goal: The participant improves quality of Life and PHQ9 Scores as seen by post scores and/or  verbalization of changes             Quality of Life Scores:  Scores of 19 and below usually indicate a poorer quality of life in these areas.  A difference of  2-3 points is a clinically meaningful difference.  A difference of 2-3 points in the total score of the Quality of Life Index has been associated with significant improvement in overall quality of life, self-image, physical symptoms, and general health in studies assessing change in quality of life.  PHQ-9: Recent Review Flowsheet Data     Depression screen [facility] 2/9 08/10/2020   Decreased Interest 0   Down, Depressed, Hopeless 0   PHQ - 2 Score 0   Altered sleeping 1   Tired, decreased energy 1   Change in appetite 0   Feeling bad or failure about yourself  0   Trouble concentrating 0   Moving slowly or fidgety/restless 0   Suicidal thoughts 0   PHQ-9 Score 2   Difficult doing work/chores Not difficult at all      Interpretation of Total Score  Total Score Depression Severity:  1-4 = Minimal depression, 5-9 = Mild depression, 10-14 = Moderate depression, 15-19 = Moderately severe depression, 20-27 = Severe depression   Psychosocial Evaluation and Intervention:  Psychosocial Evaluation - 08/04/20 1517       Psychosocial Evaluation & Interventions   Interventions Encouraged to exercise with the program and follow exercise prescription;Stress management education;Relaxation education    Comments Garrett Peterson has had a complicated medical history this last year and a half. He was diagnosed with colon CA last year and then randomnly started experiencing severe shortness of breaht this last fall. 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 He used to spend a lot of his time outdoors doing different activities, but now can't due to the oxygen tanks and shortness of breath. His sleep pattern has been unusual for the last few years with no recent change, where he sleeps only a little some nights and then up the rest of the  night. 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 He received news recently that he was cancer free so now he is focused on his breathing issues. His main goal is to feel better and hopefully be able to wear a concentrator so he can do more of his hobbies outside.    Expected Outcomes Short; attend pulmonary rehab for education and exercise. Long: develop positive self care habits.    Continue Psychosocial Services  Follow up required by staff             Psychosocial Re-Evaluation:  Psychosocial Re-Evaluation     Garrett Peterson Name 09/26/20 0944 10/17/20 0942 11/16/20 0933 12/05/20 0941       Psychosocial Re-Evaluation   Current issues with Current Stress Concerns;Current Sleep Concerns Current Stress Concerns;Current Sleep Concerns Current Stress Concerns;Current Sleep Concerns Current Stress Concerns    Comments His shortness of breath and his age are starting to get to him. He is not sure how much longer he has left. He states that he has never been able to sleep good. He has regrets of not being compassionate with his father and his lung disease. Garrett Peterson is feeling much better than he was 3  weeks ago since he has got over bronchitis.  He is determined to get better.  He still does not sleep well.  He states hes always been a light sleeper. Garrett Peterson is doing well, still frustrated with his breathing.  Overall, he is doing well mentally.  He still does not sleep well but has not talked to doctor about his sleep.  We also mentioned getting tools to help with breathing training at home. Garrett Peterson reports doing better with his stress as he starts to get better and make progress. He felt stressed when he was active and then had to stop due to breathing. He reports sleeping well most of the time. To help relax he likes to watch TV. His wife is a good support system for him.    Expected Outcomes Short: Continue to exercise regularly to support mental health and  notify staff of any changes. Long: maintain mental health and well being through teaching of rehab or prescribed medications independently. Short: continue to attend LW Long: attend education to help manage stress Short: Talk to doctor about breathing and sleep. Long: Continue to focus on positive. Short: continue to exercise to help improve breathing Long: Continue to focus on positive.    Interventions Encouraged to attend Pulmonary Rehabilitation for the exercise -- Encouraged to attend Pulmonary Rehabilitation for the exercise Encouraged to attend Pulmonary Rehabilitation for the exercise    Continue Psychosocial Services  Follow up required by staff -- Follow up required by staff Follow up required by staff         Initial Review        Source of Stress Concerns Chronic Illness -- -- Chronic Illness;Unable to participate in former interests or hobbies;Unable to perform yard/household activities            Psychosocial Discharge (Final Psychosocial Re-Evaluation):  Psychosocial Re-Evaluation - 12/05/20 0941       Psychosocial Re-Evaluation   Current issues with Current Stress Concerns    Comments Garrett Peterson reports doing better with his stress as he starts to get better and make progress. He felt stressed when he was active and then had to stop due to breathing. He reports sleeping well most of the time. To help relax he likes to watch TV. His wife is a good support system for him.    Expected Outcomes Short: continue to exercise to help improve breathing Long: Continue to focus on positive.    Interventions Encouraged to attend Pulmonary Rehabilitation for the exercise    Continue Psychosocial Services  Follow up required by staff      Initial Review   Source of Stress Concerns Chronic Illness;Unable to participate in former interests or hobbies;Unable to perform yard/household activities             Education: Education Goals: Education classes will be provided on a weekly basis,  covering required topics. Participant will state understanding/return demonstration of topics presented.  Learning Barriers/Preferences:  Learning Barriers/Preferences - 08/04/20 1508       Learning Barriers/Preferences   Learning Barriers None    Learning Preferences None             General Pulmonary Education Topics:  Infection Prevention: - Provides verbal and written material to individual with discussion of infection control including proper hand washing and proper equipment cleaning during exercise session. Flowsheet Row Pulmonary Rehab from 10/12/2020 in [facility] Cardiac and Pulmonary Rehab  Date 08/10/20  Educator [facility]  Instruction Review Code 1- Verbalizes Understanding  Falls Prevention: - Provides verbal and written material to individual with discussion of falls prevention and safety. Flowsheet Row Pulmonary Rehab from 10/12/2020 in [facility] Cardiac and Pulmonary Rehab  Date 08/10/20  Educator [facility]  Instruction Review Code 1- Verbalizes Understanding       Chronic Lung Disease Review: - Group verbal instruction with posters, models, PowerPoint presentations and videos,  to review new updates, new respiratory medications, new advancements in procedures and treatments. Providing information on websites and "800" numbers for continued self-education. Includes information about supplement oxygen, available portable oxygen systems, continuous and intermittent flow rates, oxygen safety, concentrators, and Medicare reimbursement for oxygen. Explanation of Pulmonary Drugs, including class, frequency, complications, importance of spacers, rinsing mouth after steroid MDI's, and proper cleaning methods for nebulizers. Review of basic lung anatomy and physiology related to function, structure, and complications of lung disease. Review of risk factors. Discussion about methods for diagnosing sleep apnea and types of masks and machines for OSA. Includes a review of the use of types of  environmental controls: home humidity, furnaces, filters, dust mite/pet prevention, HEPA vacuums. Discussion about weather changes, air quality and the benefits of nasal washing. Instruction on Warning signs, infection symptoms, calling MD promptly, preventive modes, and value of vaccinations. Review of effective airway clearance, coughing and/or vibration techniques. Emphasizing that all should Create an Action Plan. Written material given at graduation. Flowsheet Row Pulmonary Rehab from 10/12/2020 in [facility] Cardiac and Pulmonary Rehab  Education need identified 08/10/20  Date 10/12/20  Educator [facility]  Instruction Review Code 1- Verbalizes Understanding       AED/CPR: - Group verbal and written instruction with the use of models to demonstrate the basic use of the AED with the basic ABC's of resuscitation.    Anatomy and Cardiac Procedures: - Group verbal and visual presentation and models provide information about basic cardiac anatomy and function. Reviews the testing methods done to diagnose heart disease and the outcomes of the test results. Describes the treatment choices: Medical Management, Angioplasty, or Coronary Bypass Surgery for treating various heart conditions including Myocardial Infarction, Angina, Valve Disease, and Cardiac Arrhythmias.  Written material given at graduation.   Medication Safety: - Group verbal and visual instruction to review commonly prescribed medications for heart and lung disease. Reviews the medication, class of the drug, and side effects. Includes the steps to properly store meds and maintain the prescription regimen.  Written material given at graduation.   Other: -Provides group and verbal instruction on various topics (see comments)   Knowledge Questionnaire Score:  Knowledge Questionnaire Score - 08/10/20 1002       Knowledge Questionnaire Score   Pre Score 13/15 Education Focus: O2 safety, exercise              Core Components/Risk  Factors/Patient Goals at Admission:  Personal Goals and Risk Factors at Admission - 08/10/20 1002       Core Components/Risk Factors/Patient Goals on Admission    Weight Management Yes;Obesity;Weight Loss    Intervention Weight Management: Develop a combined nutrition and exercise program designed to reach desired caloric intake, while maintaining appropriate intake of nutrient and fiber, sodium and fats, and appropriate energy expenditure required for the weight goal.;Weight Management: Provide education and appropriate resources to help participant work on and attain dietary goals.;Weight Management/Obesity: Establish reasonable short term and long term weight goals.;Obesity: Provide education and appropriate resources to help participant work on and attain dietary goals.    Admit Weight 228 lb (103.4 kg)  Goal Weight: Short Term 220 lb (99.8 kg)    Goal Weight: Long Term 215 lb (97.5 kg)    Expected Outcomes Short Term: Continue to assess and modify interventions until short term weight is achieved;Long Term: Adherence to nutrition and physical activity/exercise program aimed toward attainment of established weight goal;Weight Loss: Understanding of general recommendations for a balanced deficit meal plan, which promotes 1-2 lb weight loss per week and includes a negative energy balance of [phone] kcal/d;Understanding recommendations for meals to include 15-35% energy as protein, 25-35% energy from fat, 35-60% energy from carbohydrates, less than 243m of dietary cholesterol, 20-35 gm of total fiber daily;Understanding of distribution of calorie intake throughout the day with the consumption of 4-5 meals/snacks    Improve shortness of breath with ADL's Yes    Intervention Provide education, individualized exercise plan and daily activity instruction to help decrease symptoms of SOB with activities of daily living.    Expected Outcomes Short Term: Improve cardiorespiratory fitness to achieve a  reduction of symptoms when performing ADLs;Long Term: Be able to perform more ADLs without symptoms or delay the onset of symptoms    Hypertension Yes    Intervention Provide education on lifestyle modifcations including regular physical activity/exercise, weight management, moderate sodium restriction and increased consumption of fresh fruit, vegetables, and low fat dairy, alcohol moderation, and smoking cessation.;Monitor prescription use compliance.    Expected Outcomes Short Term: Continued assessment and intervention until BP is < 140/965mHG in hypertensive participants. < 130/8031mG in hypertensive participants with diabetes, heart failure or chronic kidney disease.;Long Term: Maintenance of blood pressure at goal levels.    Lipids Yes    Intervention Provide education and support for participant on nutrition & aerobic/resistive exercise along with prescribed medications to achieve LDL <68m33mDL >40mg71m Expected Outcomes Short Term: Participant states understanding of desired cholesterol values and is compliant with medications prescribed. Participant is following exercise prescription and nutrition guidelines.;Long Term: Cholesterol controlled with medications as prescribed, with individualized exercise RX and with personalized nutrition plan. Value goals: LDL < 68mg,62m > 40 mg.             Education:Diabetes - Individual verbal and written instruction to review signs/symptoms of diabetes, desired ranges of glucose level fasting, after meals and with exercise. Acknowledge that pre and post exercise glucose checks will be done for 3 sessions at entry of program.   Know Your Numbers and Heart Failure: - Group verbal and visual instruction to discuss disease risk factors for cardiac and pulmonary disease and treatment options.  Reviews associated critical values for Overweight/Obesity, Hypertension, Cholesterol, and Diabetes.  Discusses basics of heart failure: signs/symptoms and  treatments.  Introduces Heart Failure Zone chart for action plan for heart failure.  Written material given at graduation.   Core Components/Risk Factors/Patient Goals Review:   Goals and Risk Factor Review     Row Name 09/26/20 0935 10/17/20 0939 11/16/20 0930 12/05/20 0934       Core Components/Risk Factors/Patient Goals Review   Personal Goals Review Improve shortness of breath with ADL's Improve shortness of breath with ADL's Improve shortness of breath with ADL's;Weight Management/Obesity;Hypertension Improve shortness of breath with ADL's;Weight Management/Obesity;Hypertension    Review Spoke to patient about their shortness of breath and what they can do to improve. Patient has been informed of breathing techniques when starting the program. Patient is informed to tell staff if they have had any med changes and that certain meds they are taking or  not taking can be causing shortness of breath. Garrett Peterson had a heart cath last week and his heart is great.  He sees pulmonologist Friday who has also emphasized importance of exercise.  He feels like he can do more than he could when he started.  He would  like to get down to 2L of oxygen.  We reviewed importance of consistent attendance. Garrett Peterson is down to 220 lb and holding steady.  His doctor would like for him to get down to 215 lb.  He checks his pressures 2x a day and usually its pretty good, highest has been 144.  His breathing is still his biggest limitation but he has not been using PLB so we talked more about it today. He reports tyvaso (5 breaths per day)  has been helping him breathe better. He feels he can breathe longer without giving out. Still having shortness of breath when reaching up overhead. He continues to practice his PLB which he feels helped, but not as much as the medication he started. He is wathcing out for side effects from medication- none so far. He checks his BP at home, 124-130/70-80; today 118/66. Taking all medication as  prescribed. He sees his family MD friday. He reports his weight has been stable    Expected Outcomes Short: Attend [facility] regularly to improve shortness of breath with ADL's. Long: maintain independence with ADL's Short: exercise at least 3 times a week Long: be able to reduce to 2L oxygen Short: Work on PLB Long: COnitnue to work on [employer] loss Short: Work on PLB Long: Continue to work on [employer] loss             Core Components/Risk Factors/Patient Goals at Discharge (Final Review):   Goals and Risk Factor Review - 12/05/20 0934       Core Components/Risk Factors/Patient Goals Review   Personal Goals Review Improve shortness of breath with ADL's;Weight Management/Obesity;Hypertension    Review He reports tyvaso (5 breaths per day)  has been helping him breathe better. He feels he can breathe longer without giving out. Still having shortness of breath when reaching up overhead. He continues to practice his PLB which he feels helped, but not as much as the medication he started. He is wathcing out for side effects from medication- none so far. He checks his BP at home, 124-130/70-80; today 118/66. Taking all medication as prescribed. He sees his family MD friday. He reports his weight has been stable    Expected Outcomes Short: Work on PLB Long: Continue to work on weight loss             ITP Comments:  ITP Comments     Row Name 08/04/20 1504 08/10/20 0950 08/14/20 0810 08/16/20 1141 08/30/20 0949   ITP Comments Initial telephone orientation completed. Diagnosis can be found in [facility] 3/23. EP orientation scheduled for Thursday 3/31 at 8am. Completed 6MWT and gym orientation. Initial ITP created and sent for review to Dr. Emily Filbert, Medical Director. First full day of exercise!  Patient was oriented to gym and equipment including functions, settings, policies, and procedures.  Patient's individual exercise prescription and treatment plan were reviewed.  All starting workloads were  established based on the results of the 6 minute walk test done at initial orientation visit.  The plan for exercise progression was also introduced and progression will be customized based on patient's performance and goals. Completed initial RD evaluation 30 Day review completed. Medical Director ITP review done, changes made as  directed, and signed approval by Medical Director.    [place] Name 09/04/20 1542 09/27/20 0650 10/25/20 0730 11/22/20 0650 12/20/20 0714   ITP Comments Called to check on patient. He was out sick last week with bronchitis.  His doctor cleared him to return once he feels up to it.  He is starting to feel better now.  He hopes to return on Friday 4/29. 30 Day review completed. Medical Director ITP review done, changes made as directed, and signed approval by Medical Director.  1 visit in May 30 Day review completed. Medical Director ITP review done, changes made as directed, and signed approval by Medical Director. 30 Day review completed. Medical Director ITP review done, changes made as directed, and signed approval by Medical Director. 30 Day review completed. Medical Director ITP review done, changes made as directed, and signed approval by Medical Director.            Comments:

## 2020-12-21 ENCOUNTER — Other Ambulatory Visit: Payer: Self-pay

## 2020-12-21 DIAGNOSIS — J849 Interstitial pulmonary disease, unspecified: Secondary | ICD-10-CM

## 2020-12-21 NOTE — Progress Notes (Signed)
Daily Session Note  Patient Details  Name: Garrett Peterson MRN: [id number] Date of Birth: 02/18/1946 Referring Provider:   Flowsheet Row Pulmonary Rehab from 08/10/2020 in [facility] Cardiac and Pulmonary Rehab  Referring Provider Brand Males MD       Encounter Date: 12/21/2020  Check In:  Session Check In - 12/21/20 0920       Check-In   Supervising physician immediately available to respond to emergencies See telemetry face sheet for immediately available ER MD    Location [facility]-Cardiac & Pulmonary Rehab    Staff Present Birdie Sons, MPA, Mauricia Area, BS, ACSM CEP, Exercise Physiologist;Amanda Oletta Darter, BA, ACSM CEP, Exercise Physiologist    Virtual Visit No    Medication changes reported     No    Fall or balance concerns reported    No    Warm-up and Cool-down Performed on first and last piece of equipment    Resistance Training Performed Yes    VAD Patient? No    PAD/SET Patient? No      Pain Assessment   Currently in Pain? No/denies                Social History   Tobacco Use  Smoking Status Never  Smokeless Tobacco Former   Types: Chew    Goals Met:  Independence with exercise equipment Exercise tolerated well No report of cardiac concerns or symptoms Strength training completed today  Goals Unmet:  Not Applicable  Comments: Pt able to follow exercise prescription today without complaint.  Will continue to monitor for progression.    Dr. Emily Filbert is Medical Director for [place].  Dr. Ottie Glazier is Medical Director for [facility] Pulmonary Rehabilitation.

## 2020-12-22 NOTE — Telephone Encounter (Signed)
Patient states Garrett Peterson approved Esbriet medication. Patient phone number is [phone].

## 2020-12-22 NOTE — Telephone Encounter (Signed)
[place] for update. Rep was able to check eligibility while I was on phone. Patient approved for Esbriet through [place] patient assistance program from 12/22/20 until further notice.  Awaiting faxed confirmation letter from [place]. Rep will be reaching out to patient to review ordering process  Phone: [phone]

## 2020-12-25 MED ORDER — PIRFENIDONE 267 MG PO TABS: ORAL_TABLET | ORAL | 0 refills | Status: DC

## 2020-12-25 NOTE — Telephone Encounter (Signed)
Spoke with patient regarding Esbriet approval.   Thoroughly counseled patient on the efficacy, mechanism of action, dosing, administration, adverse effects, and monitoring parameters of Esbriet.  He could not previously tolerate Ofev due to persistent diarrhea and nausea.  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   Dosing: Starting dose will be Esbriet 267 mg 1 tablet three times daily for 7 days, then 2 tablets three times daily for 7 days, then 3 tablets three times daily.  Maintenance dose will be 801 mg 1 tablet three times daily if tolerated.  Stressed the importance of taking with meals and space at least 5-6 hours apart to minimize stomach upset.  Reviewed taking ginger capsules to help with nausea and that we could prescribe ondansetron if he needed rx med for nausea. He does not spend much time in the sun even sitting on the porch. He has been advised to wear sunscreen when he goes out in the sun, long sleeves and hat.  Adverse Effects: Nausea, vomiting, diarrhea, weight loss Abdominal pain GERD Sun sensitivity/rash - patient advised to wear sunscreen when exposed to sunlight Dizziness Fatigue  Monitoring: Monitor for diarrhea, nausea and vomiting, GI perforation, hepatotoxicity  Monitor LFTs - baseline, monthly for first 6 months, then every 3 months routinely. LFTs on 11/07/20 wnl CBC w differential at baseline and every 3 months routinely  Patient will plan to call [facility] pharmacy to schedule shipment. He was able to read back pharmacy phone number. Rx for starter month ordered as no-print today since rx was sent with [facility] enrollment application.  F/u with Dr. Chase Caller scheduled on 02/02/21. Patient has been advised we will repeat hepatic function panel after that visit since it will be approximately 1 month since starting Esbriet.  [name], PharmD, MPH, BCPS Clinical Pharmacist (Rheumatology and  Pulmonology)

## 2020-12-26 ENCOUNTER — Other Ambulatory Visit: Payer: Self-pay

## 2020-12-26 DIAGNOSIS — J849 Interstitial pulmonary disease, unspecified: Secondary | ICD-10-CM | POA: Diagnosis not present

## 2020-12-26 NOTE — Progress Notes (Signed)
Daily Session Note  Patient Details  Name: Garrett Peterson MRN: [id number] Date of Birth: [birth year] Referring Provider:   Flowsheet Row Pulmonary Rehab from 08/10/2020 in [facility] Cardiac and Pulmonary Rehab  Referring Provider Brand Males MD       Encounter Date: 12/26/2020  Check In:  Session Check In - 12/26/20 0923       Check-In   Supervising physician immediately available to respond to emergencies See telemetry face sheet for immediately available ER MD    Location [facility]-Cardiac & Pulmonary Rehab    Staff Present Birdie Sons, MPA, RN;Melissa [place], RDN, Rowe Pavy, BA, ACSM CEP, Exercise Physiologist    Virtual Visit No    Medication changes reported     No    Fall or balance concerns reported    No    Warm-up and Cool-down Performed on first and last piece of equipment    Resistance Training Performed Yes    VAD Patient? No    PAD/SET Patient? No      Pain Assessment   Currently in Pain? No/denies                Social History   Tobacco Use  Smoking Status Never  Smokeless Tobacco Former   Types: Chew    Goals Met:  Independence with exercise equipment Exercise tolerated well No report of cardiac concerns or symptoms Strength training completed today  Goals Unmet:  Not Applicable  Comments: Pt able to follow exercise prescription today without complaint.  Will continue to monitor for progression.    Dr. Emily Filbert is Medical Director for [place].  Dr. Ottie Glazier is Medical Director for [name] Pulmonary Rehabilitation.

## 2020-12-28 ENCOUNTER — Other Ambulatory Visit: Payer: Self-pay

## 2020-12-28 DIAGNOSIS — J849 Interstitial pulmonary disease, unspecified: Secondary | ICD-10-CM | POA: Diagnosis not present

## 2020-12-28 NOTE — Progress Notes (Signed)
Daily Session Note  Patient Details  Name: Garrett Peterson MRN: [id number] Date of Birth: 06/23/1945 Referring Provider:   Flowsheet Row Pulmonary Rehab from 08/10/2020 in [facility] Cardiac and Pulmonary Rehab  Referring Provider Brand Males MD       Encounter Date: 12/28/2020  Check In:  Session Check In - 12/28/20 0913       Check-In   Supervising physician immediately available to respond to emergencies See telemetry face sheet for immediately available ER MD    Location [facility]-Cardiac & Pulmonary Rehab    Staff Present Birdie Sons, MPA, Mauricia Area, BS, ACSM CEP, Exercise Physiologist;Joseph Tessie Fass, [place]    Virtual Visit No    Medication changes reported     No    Fall or balance concerns reported    No    Warm-up and Cool-down Performed on first and last piece of equipment    Resistance Training Performed Yes    VAD Patient? No    PAD/SET Patient? No      Pain Assessment   Currently in Pain? No/denies                Social History   Tobacco Use  Smoking Status Never  Smokeless Tobacco Former   Types: Chew    Goals Met:  Independence with exercise equipment Exercise tolerated well No report of cardiac concerns or symptoms Strength training completed today  Goals Unmet:  Not Applicable  Comments: Pt able to follow exercise prescription today without complaint.  Will continue to monitor for progression.    Dr. Emily Filbert is Medical Director for [place].  Dr. Ottie Glazier is Medical Director for [facility] Pulmonary Rehabilitation.

## 2021-01-01 ENCOUNTER — Telehealth: Payer: Self-pay | Admitting: Internal Medicine

## 2021-01-01 NOTE — Telephone Encounter (Signed)
Should be fine to be in the house but if it is bothering the breathing then he needs to step out

## 2021-01-01 NOTE — Telephone Encounter (Signed)
Called and spoke with pt letting him know the info stated by MR and he verbalized understanding. Nothing further needed.

## 2021-01-01 NOTE — Telephone Encounter (Signed)
Called and spoke with pt's wife [place] who stated that they had painting done in the kitchen.  States that the paint is a latex paint that does not have any odor to it.  [place] stated that pt has been at the house some today and has not had any problems from the paint.  Routing to MR.

## 2021-01-02 ENCOUNTER — Other Ambulatory Visit: Payer: Self-pay

## 2021-01-02 DIAGNOSIS — J849 Interstitial pulmonary disease, unspecified: Secondary | ICD-10-CM

## 2021-01-02 DIAGNOSIS — J9611 Chronic respiratory failure with hypoxia: Secondary | ICD-10-CM

## 2021-01-02 NOTE — Progress Notes (Signed)
Daily Session Note  Patient Details  Name: Garrett Peterson MRN: [id number] Date of Birth: 1946-05-06 Referring Provider:   Flowsheet Row Pulmonary Rehab from 08/10/2020 in [facility] Cardiac and Pulmonary Rehab  Referring Provider Brand Males MD       Encounter Date: 01/02/2021  Check In:  Session Check In - 01/02/21 0919       Check-In   Supervising physician immediately available to respond to emergencies See telemetry face sheet for immediately available ER MD    Location [facility]-Cardiac & Pulmonary Rehab    Staff Present Birdie Sons, MPA, RN;Amanda Oletta Darter, BA, ACSM CEP, Exercise Physiologist;Jessica Luan Pulling, MA, RCEP, CCRP, CCET    Virtual Visit No    Medication changes reported     No    Fall or balance concerns reported    No    Warm-up and Cool-down Performed on first and last piece of equipment    Resistance Training Performed Yes    VAD Patient? No    PAD/SET Patient? No      Pain Assessment   Currently in Pain? No/denies                Social History   Tobacco Use  Smoking Status Never  Smokeless Tobacco Former   Types: Chew    Goals Met:  Independence with exercise equipment Exercise tolerated well No report of cardiac concerns or symptoms Strength training completed today  Goals Unmet:  Not Applicable  Comments: Pt able to follow exercise prescription today without complaint.  Will continue to monitor for progression.    Dr. Emily Filbert is Medical Director for [place].  Dr. Ottie Glazier is Medical Director for [facility] Pulmonary Rehabilitation.

## 2021-01-04 ENCOUNTER — Other Ambulatory Visit: Payer: Self-pay

## 2021-01-04 VITALS — Ht 68.0 in | Wt 223.2 lb

## 2021-01-04 DIAGNOSIS — J849 Interstitial pulmonary disease, unspecified: Secondary | ICD-10-CM

## 2021-01-04 NOTE — Progress Notes (Signed)
Daily Session Note  Patient Details  Name: Garrett Peterson MRN: [id number] Date of Birth: 06-04-1945 Referring Provider:   Flowsheet Row Pulmonary Rehab from 08/10/2020 in [facility] Cardiac and Pulmonary Rehab  Referring Provider Brand Males MD       Encounter Date: 01/04/2021  Check In:  Session Check In - 01/04/21 0925       Check-In   Supervising physician immediately available to respond to emergencies See telemetry face sheet for immediately available ER MD    Location [facility]-Cardiac & Pulmonary Rehab    Staff Present Birdie Sons, MPA, Elveria Rising, BA, ACSM CEP, Exercise Physiologist;Diondre Pulis Amedeo Plenty, BS, ACSM CEP, Exercise Physiologist    Virtual Visit No    Medication changes reported     No    Fall or balance concerns reported    No    Warm-up and Cool-down Performed on first and last piece of equipment    Resistance Training Performed Yes    VAD Patient? No    PAD/SET Patient? No      Pain Assessment   Currently in Pain? No/denies                Social History   Tobacco Use  Smoking Status Never  Smokeless Tobacco Former   Types: Chew    Goals Met:  Independence with exercise equipment Exercise tolerated well No report of cardiac concerns or symptoms Strength training completed today  Goals Unmet:  Not Applicable  Comments: Pt able to follow exercise prescription today without complaint.  Will continue to monitor for progression.    Dr. Emily Filbert is Medical Director for [place].  Dr. Ottie Glazier is Medical Director for [facility] Pulmonary Rehabilitation.

## 2021-01-09 ENCOUNTER — Other Ambulatory Visit: Payer: Self-pay

## 2021-01-09 DIAGNOSIS — J849 Interstitial pulmonary disease, unspecified: Secondary | ICD-10-CM | POA: Diagnosis not present

## 2021-01-09 NOTE — Progress Notes (Signed)
Daily Session Note  Patient Details  Name: Garrett Peterson MRN: [id number] Date of Birth: Jul 18, 1945 Referring Provider:   Flowsheet Row Pulmonary Rehab from 08/10/2020 in [facility] Cardiac and Pulmonary Rehab  Referring Provider Brand Males MD       Encounter Date: 01/09/2021  Check In:  Session Check In - 01/09/21 0923       Check-In   Supervising physician immediately available to respond to emergencies See telemetry face sheet for immediately available ER MD    Location [facility]-Cardiac & Pulmonary Rehab    Staff Present Birdie Sons, MPA, RN;Jessica Luan Pulling, MA, RCEP, CCRP, CCET;Amanda Sommer, BA, ACSM CEP, Exercise Physiologist    Virtual Visit No    Medication changes reported     No    Fall or balance concerns reported    No    Warm-up and Cool-down Performed on first and last piece of equipment    Resistance Training Performed Yes    VAD Patient? No    PAD/SET Patient? No      Pain Assessment   Currently in Pain? No/denies                Social History   Tobacco Use  Smoking Status Never  Smokeless Tobacco Former   Types: Chew    Goals Met:  Independence with exercise equipment Exercise tolerated well No report of concerns or symptoms today Strength training completed today  Goals Unmet:  Not Applicable  Comments: Pt able to follow exercise prescription today without complaint.  Will continue to monitor for progression.    Dr. Emily Filbert is Medical Director for [place].  Dr. Ottie Glazier is Medical Director for [facility] Pulmonary Rehabilitation.

## 2021-01-11 ENCOUNTER — Encounter: Payer: Medicare Other | Attending: Internal Medicine | Admitting: *Deleted

## 2021-01-11 ENCOUNTER — Telehealth: Payer: Self-pay | Admitting: Internal Medicine

## 2021-01-11 ENCOUNTER — Other Ambulatory Visit: Payer: Self-pay

## 2021-01-11 DIAGNOSIS — J849 Interstitial pulmonary disease, unspecified: Secondary | ICD-10-CM | POA: Diagnosis present

## 2021-01-11 DIAGNOSIS — J9611 Chronic respiratory failure with hypoxia: Secondary | ICD-10-CM | POA: Diagnosis present

## 2021-01-11 NOTE — Telephone Encounter (Signed)
The runny nose is probably not from the treprostinil.  Is probably from the oxygen may be some postnasal drainage for unclear reasons.  Plan - take generic fluticasone inhaler 2 squirts each nostril daily -If this does not work then we should try other methodologies

## 2021-01-11 NOTE — Telephone Encounter (Signed)
Spoke with pt who states having a runny nose every day for a few hours that produces a clear thin mucus. He would like any advice to stop his nose from running.  Pt also would like Dr. Chase Caller to know he started Esbrit on Monday and is currently taking 10 puffs of his Tyvason 4 times a day. Dr. Chase Caller would you please advise on pt's running nose.

## 2021-01-11 NOTE — Telephone Encounter (Signed)
I called and spoke with patient regarding MR recs. Patient verbalized understanding and will let us know if Flonase does not help. Nothing further needed.

## 2021-01-11 NOTE — Progress Notes (Signed)
Daily Session Note  Patient Details  Name: Garrett Peterson MRN: [id number] Date of Birth: [birth year] Referring Provider:   Flowsheet Row Pulmonary Rehab from 08/10/2020 in [facility] Cardiac and Pulmonary Rehab  Referring Provider Brand Males MD       Encounter Date: 01/11/2021  Check In:  Session Check In - 01/11/21 8614       Check-In   Supervising physician immediately available to respond to emergencies See telemetry face sheet for immediately available ER MD    Location [facility]-Cardiac & Pulmonary Rehab    Staff Present Birdie Sons, MPA, RN;Melissa Gotham, RDN, LDN;Megan Presti [place], MA, RCEP, CCRP, Rosalio Macadamia, BS, ACSM CEP, Exercise Physiologist;Joseph Golconda, [place]    Virtual Visit No    Medication changes reported     No    Fall or balance concerns reported    No    Warm-up and Cool-down Performed on first and last piece of equipment    Resistance Training Performed Yes    VAD Patient? No    PAD/SET Patient? No      Pain Assessment   Currently in Pain? No/denies                Social History   Tobacco Use  Smoking Status Never  Smokeless Tobacco Former   Types: Chew    Goals Met:  Proper associated with RPD/PD & O2 Sat Independence with exercise equipment Exercise tolerated well No report of concerns or symptoms today Strength training completed today  Goals Unmet:  Not Applicable  Comments: Pt able to follow exercise prescription today without complaint.  Will continue to monitor for progression.    Dr. Emily Filbert is Medical Director for [place].  Dr. Ottie Glazier is Medical Director for [facility] Pulmonary Rehabilitation.

## 2021-01-16 ENCOUNTER — Other Ambulatory Visit: Payer: Self-pay

## 2021-01-16 DIAGNOSIS — J849 Interstitial pulmonary disease, unspecified: Secondary | ICD-10-CM

## 2021-01-16 DIAGNOSIS — J9611 Chronic respiratory failure with hypoxia: Secondary | ICD-10-CM

## 2021-01-16 NOTE — Progress Notes (Signed)
Daily Session Note  Patient Details  Name: Garrett Peterson MRN: [id number] Date of Birth: 1945-10-04 Referring Provider:   Flowsheet Row Pulmonary Rehab from 08/10/2020 in [facility] Cardiac and Pulmonary Rehab  Referring Provider Brand Males MD       Encounter Date: 01/16/2021  Check In:  Session Check In - 01/16/21 0920       Check-In   Supervising physician immediately available to respond to emergencies See telemetry face sheet for immediately available ER MD    Location [facility]-Cardiac & Pulmonary Rehab    Staff Present Birdie Sons, MPA, Elveria Rising, BA, ACSM CEP, Exercise Physiologist;Laureen Owens Shark, BS, RRT, CPFT    Virtual Visit No    Medication changes reported     No    Fall or balance concerns reported    No    Warm-up and Cool-down Performed on first and last piece of equipment    Resistance Training Performed Yes    VAD Patient? No    PAD/SET Patient? No      Pain Assessment   Currently in Pain? No/denies                Social History   Tobacco Use  Smoking Status Never  Smokeless Tobacco Former   Types: Chew    Goals Met:  Independence with exercise equipment Exercise tolerated well No report of concerns or symptoms today Strength training completed today  Goals Unmet:  Not Applicable  Comments: Pt able to follow exercise prescription today without complaint.  Will continue to monitor for progression.    Dr. Emily Filbert is Medical Director for [place].  Dr. Ottie Glazier is Medical Director for [facility] Pulmonary Rehabilitation.

## 2021-01-17 ENCOUNTER — Encounter: Payer: Self-pay | Admitting: *Deleted

## 2021-01-17 DIAGNOSIS — J9611 Chronic respiratory failure with hypoxia: Secondary | ICD-10-CM

## 2021-01-17 DIAGNOSIS — J849 Interstitial pulmonary disease, unspecified: Secondary | ICD-10-CM

## 2021-01-17 NOTE — Progress Notes (Signed)
Pulmonary Individual Treatment Plan  Patient Details  Name: Garrett Peterson MRN: [id number] Date of Birth: 1946-01-21 Referring Provider:   Flowsheet Row Pulmonary Rehab from 08/10/2020 in [facility] Cardiac and Pulmonary Rehab  Referring Provider Garrett Males MD       Initial Encounter Date:  Flowsheet Row Pulmonary Rehab from 08/10/2020 in [facility] Cardiac and Pulmonary Rehab  Date 08/10/20       Visit Diagnosis: ILD (interstitial lung disease) ([place])  Chronic hypoxemic respiratory failure ([place])  Patient's Home Medications on Admission:  Current Outpatient Medications:    aspirin EC 81 MG tablet, Take 1 tablet (81 mg total) by mouth daily. Swallow whole., Disp: , Rfl:    atorvastatin (LIPITOR) 20 MG tablet, Take 20 mg by mouth daily., Disp: , Rfl:    dextromethorphan-guaiFENesin (MUCINEX DM) 30-600 MG 12hr tablet, Take 1 tablet by mouth 2 (two) times daily as needed for cough., Disp: 60 tablet, Rfl: 0   furosemide (LASIX) 40 MG tablet, Take 1 tablet (40 mg total) by mouth daily., Disp: 30 tablet, Rfl: 5   metoprolol tartrate (LOPRESSOR) 25 MG tablet, Take 25 mg by mouth 2 (two) times daily., Disp: , Rfl:    mometasone (ELOCON) 0.1 % cream, Apply 1 application topically as directed. Qd to bid up to 5 days a week to aa psoriasis on arms until clear, then prn flares, Disp: 45 g, Rfl: 0   Pirfenidone (ESBRIET) 267 MG TABS, Take 1 tab by mouth three times daily for 7 days, then 2 tabs three times daily for 7 days, then 3 tabs three times daily thereafter., Disp: 207 tablet, Rfl: 0   predniSONE (DELTASONE) 5 MG tablet, TAKE 1 TABLET BY MOUTH DAILY WITH BREAKFAST, Disp: 30 tablet, Rfl: 2   temazepam (RESTORIL) 30 MG capsule, Take 30 mg by mouth at bedtime. , Disp: , Rfl:    Treprostinil (TYVASO) 0.6 MG/ML SOLN, Inhale into the lungs 4 (four) times daily., Disp: , Rfl:   Past Medical History: Past Medical History:  Diagnosis Date   (HFpEF) heart failure with preserved ejection fraction ([place])     a. 05/2020 Echo: EF 55-60%, no rwma, Gr1 DD, mildly red RV fxn; b. 09/2020 RHC: PCWP 72mHg. CO/CI 4.7/2.1.   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 09/2020 Cath: LM 70d, LAD 70ost, 80/76mLCX 100ost/p, 99p/m, RCA 100ost/p, VG->OM2 4040mIMA->LAD nl, VG->RPDA nl-->Med Rx.   [facility]   [place]   Hyperlipidemia    Hypertension    [place]  Pneumonia    Pre-diabetes    Respiratory failure with hypoxia (HCC)     Tobacco Use: Social History   Tobacco Use  Smoking Status Never  Smokeless Tobacco Former   Types: Chew    Labs: Recent Review Flowsheet Data   There is no flowsheet data to display.      Pulmonary Assessment Scores:  Pulmonary Assessment Scores     Row Name 08/10/20 1004         ADL UCSD   ADL Phase Entry     SOB Score total 66     Rest 0     Walk 1     Stairs 4     Bath 1     Dress 1  Shop 3           CAT Score   CAT Score 18           mMRC Score   mMRC Score 4              UCSD: Self-administered rating of dyspnea associated with activities of daily living (ADLs) 6-point scale (0 = "not at all" to 5 = "maximal or unable to do because of breathlessness")  Scoring Scores range from 0 to 120.  Minimally important difference is 5 units  CAT: CAT can identify the health impairment of COPD patients and is better correlated with disease progression.  CAT has a scoring range of zero to 40. The CAT score is classified into four groups of low (less than 10), medium (10 - 20), high (21-30) and very high (31-40) based on the impact level of disease on health status. A CAT score over 10 suggests significant symptoms.  A worsening CAT score could be explained by an exacerbation, poor medication adherence, poor inhaler technique, or progression of COPD or comorbid  conditions.  CAT MCID is 2 points  mMRC: mMRC (Modified Medical Research Council) Dyspnea Scale is used to assess the degree of baseline functional disability in patients of respiratory disease due to dyspnea. No minimal important difference is established. A decrease in score of 1 point or greater is considered a positive change.   Pulmonary Function Assessment:   Exercise Target Goals: Exercise Program Goal: Individual exercise prescription set using results from initial 6 min walk test and THRR while considering  patient's activity barriers and safety.   Exercise Prescription Goal: Initial exercise prescription builds to 30-45 minutes a day of aerobic activity, 2-3 days per week.  Home exercise guidelines will be given to patient during program as part of exercise prescription that the participant will acknowledge.  Education: Aerobic Exercise: - Group verbal and visual presentation on the components of exercise prescription. Introduces F.I.T.T principle from ACSM for exercise prescriptions.  Reviews F.I.T.T. principles of aerobic exercise including progression. Written material given at graduation. Flowsheet Row Pulmonary Rehab from 10/12/2020 in [facility] Cardiac and Pulmonary Rehab  Education need identified 08/10/20       Education: Resistance Exercise: - Group verbal and visual presentation on the components of exercise prescription. Introduces F.I.T.T principle from ACSM for exercise prescriptions  Reviews F.I.T.T. principles of resistance exercise including progression. Written material given at graduation.    Education: Exercise & Equipment Safety: - Individual verbal instruction and demonstration of equipment use and safety with use of the equipment. Flowsheet Row Pulmonary Rehab from 10/12/2020 in [facility] Cardiac and Pulmonary Rehab  Date 08/10/20  Educator [facility]  Instruction Review Code 1- Verbalizes Understanding       Education: Exercise Physiology & General Exercise  Guidelines: - Group verbal and written instruction with models to review the exercise physiology of the cardiovascular system and associated critical values. Provides general exercise guidelines with specific guidelines to those with heart or lung disease.    Education: Flexibility, Balance, Mind/Body Relaxation: - Group verbal and visual presentation with interactive activity on the components of exercise prescription. Introduces F.I.T.T principle from ACSM for exercise prescriptions. Reviews F.I.T.T. principles of flexibility and balance exercise training including progression. Also discusses the mind body connection.  Reviews various relaxation techniques to help reduce and manage stress (i.e. Deep breathing, progressive muscle relaxation, and visualization). Balance handout provided to take home. Written material given at graduation.   Activity Barriers & Risk Stratification:  Activity Barriers & Cardiac Risk Stratification - 08/10/20 0954       Activity Barriers & Cardiac Risk Stratification   Activity Barriers Deconditioning;Shortness of Breath;Muscular Weakness;Balance Concerns             6 Minute Walk:  6 Minute Walk     Row Name 08/10/20 0952 01/04/21 1115       6 Minute Walk   Phase Initial Discharge    Distance 400 feet 480 feet    Distance % Change -- 20 %    Distance Feet Change -- 80 ft    Walk Time 3 minutes  stopped due to desaturation 3.5 minutes    # of Rest Breaks 0 2    MPH 1.52 1.5    METS 0.66 1.03    RPE 13 13    Perceived Dyspnea  3 2    VO2 Peak 2.29 3.63    Symptoms Yes (comment) Yes (comment)    Comments SOB, chest tightness 4/10 SOB    Resting HR 57 bpm 90 bpm    Resting BP 132/64 140/76    Resting Oxygen Saturation  98 % 96 %    Exercise Oxygen Saturation  during 6 min walk 79 % 83 %    Max Ex. HR 89 bpm 99 bpm    Max Ex. BP 148/74 160/64    2 Minute Post BP 136/70 --         Interval HR   1 Minute HR 74 97    2 Minute HR 84 94    3  Minute HR 89 95    4 Minute HR -- 97    5 Minute HR -- 97    6 Minute HR -- 99    2 Minute Post HR 71 --    Interval Heart Rate? Yes Yes         Interval Oxygen   Interval Oxygen? Yes Yes    Baseline Oxygen Saturation % 98 % 96 %    1 Minute Oxygen Saturation % 89 % 88 %    1 Minute Liters of Oxygen 4 L  continuous 4 L    2 Minute Oxygen Saturation % 83 % 83 %    2 Minute Liters of Oxygen 4 L 4 L    3 Minute Oxygen Saturation % 79 % 85 %    3 Minute Liters of Oxygen 4 L 4 L    4 Minute Oxygen Saturation % -- 83 %    4 Minute Liters of Oxygen -- 4 L    5 Minute Oxygen Saturation % -- 85 %    5 Minute Liters of Oxygen -- 4 L    6 Minute Oxygen Saturation % -- 81 %    6 Minute Liters of Oxygen -- 4 L    2 Minute Post Oxygen Saturation % 97 % 94 %    2 Minute Post Liters of Oxygen 4 L 4 L            Oxygen Initial Assessment:  Oxygen Initial Assessment - 08/10/20 1003       Home Oxygen   Home Oxygen Device E-Tanks    Sleep Oxygen Prescription Continuous    Liters per minute 4    Home Exercise Oxygen Prescription Continuous    Liters per minute 4    Home Resting Oxygen Prescription Continuous    Liters per minute 4    Compliance with Home Oxygen Use Yes  Initial 6 min Walk   Oxygen Used Continuous;E-Tanks    Liters per minute 4      Program Oxygen Prescription   Program Oxygen Prescription Continuous;E-Tanks    Liters per minute 4      Intervention   Short Term Goals To learn and exhibit compliance with exercise, home and travel O2 prescription;To learn and understand importance of monitoring SPO2 with pulse oximeter and demonstrate accurate use of the pulse oximeter.;To learn and understand importance of maintaining oxygen saturations>88%;To learn and demonstrate proper use of respiratory medications;To learn and demonstrate proper pursed lip breathing techniques or other breathing techniques.     Long  Term Goals Exhibits compliance with exercise, home  and  travel O2 prescription;Verbalizes importance of monitoring SPO2 with pulse oximeter and return demonstration;Maintenance of O2 saturations>88%;Exhibits proper breathing techniques, such as pursed lip breathing or other method taught during program session;Compliance with respiratory medication;Demonstrates proper use of MDI's             Oxygen Re-Evaluation:  Oxygen Re-Evaluation     Row Name 08/14/20 0812 09/26/20 0942 10/17/20 1042 11/16/20 0923 12/05/20 0929     Program Oxygen Prescription   Program Oxygen Prescription Continuous;E-Tanks Continuous;E-Tanks Continuous;E-Tanks Continuous;E-Tanks Continuous;E-Tanks   Liters per minute _0 Home Oxygen   Home Oxygen Device E-Tanks Home Concentrator;E-Tanks Home Concentrator;E-Tanks Home Concentrator;E-Tanks Home Concentrator;E-Tanks   Sleep Oxygen Prescription _1    Liters per minute _2 Home Exercise Oxygen Prescription _3    Liters per minute _4 Home Resting Oxygen Prescription _5    Liters per minute _6 Compliance with Home Oxygen Use _7      Goals/Expected Outcomes   Short Term Goals To learn and demonstrate proper pursed lip breathing techniques or other breathing techniques.  Other Other To learn and understand importance of monitoring SPO2 with pulse oximeter and demonstrate accurate use of the pulse oximeter.;To learn and understand importance of maintaining oxygen saturations>88%;To learn and exhibit compliance with exercise, home and travel O2 prescription;To learn and demonstrate proper pursed lip breathing techniques or other breathing techniques. ;To learn and demonstrate proper use of respiratory medications To learn and understand importance of monitoring SPO2 with pulse oximeter and demonstrate accurate use of the pulse  oximeter.;To learn and understand importance of maintaining oxygen saturations>88%;To learn and exhibit compliance with exercise, home and travel O2 prescription;To learn and demonstrate proper use of respiratory medications;To learn and demonstrate proper pursed lip breathing techniques or other breathing techniques.    Long  Term Goals Exhibits proper breathing techniques, such as pursed lip breathing or other method taught during program session Other Other Exhibits compliance with exercise, home  and travel O2 prescription;Verbalizes importance of monitoring SPO2 with pulse oximeter and return demonstration;Maintenance of O2 saturations>88%;Exhibits proper breathing techniques, such as pursed lip breathing or other method taught during program session;Compliance with respiratory medication;Demonstrates proper use of MDI's Exhibits compliance with exercise, home  and travel O2 prescription;Verbalizes importance of monitoring SPO2 with pulse oximeter and return demonstration;Maintenance of O2 saturations>88%;Exhibits proper breathing techniques, such as pursed lip breathing or other method taught during program session;Compliance with respiratory medication;Demonstrates proper use of MDI's   Comments Reviewed PLB technique with pt.  Talked about how it works and it's importance in maintaining their exercise saturations. Patient has been sick  with Bronchitis and is now ready to resume rehab. Garrett wants to get his shortness of breath under control and work on his breathing techniques. Garrett Peterson is glad to be back feeling better and is determined to feel better.  Garrett sees pulmonologist Friday and wants to be able to use 2L of oxygen. Garrett Peterson is doing well in rehab.  Garrett is working on his breathing, still gets SOB when Garrett reaches up overhead.  We talked more about using his PLB to help train breathing and getting insiprometer and flutter valve to help train his lungs too.  Garrett really wants to get down to 2L. Garrett reports tyvaso  (5 breaths per day)  has been helping him breathe better. Garrett feels Garrett can breathe longer without giving out. Still having shortness of breath when reaching up overhead. Garrett continues to practice his PLB which Garrett feels helped, but not as much as the medication Garrett started.   Goals/Expected Outcomes Short: Become more profiecient at using PLB.   Long: Become independent at using PLB. Short: continue rehab and work on breathing techniques. Long: reduce shortness of breath. Short:  get back to regular exercise Long:  be able to use 2L oxygen Short: Work on breathing techniques Long: Continue to work on breathing. Short: Work on breathing techniques Long: Continue to work on breathing.    Garrett Peterson Name 12/26/20 0931             Program Oxygen Prescription   Program Oxygen Prescription Continuous;E-Tanks       Liters per minute 4               Home Oxygen   Home Oxygen Device Home Concentrator;E-Tanks       Sleep Oxygen Prescription Continuous       Liters per minute 4       Home Exercise Oxygen Prescription Continuous       Liters per minute 4       Home Resting Oxygen Prescription Continuous       Liters per minute 4       Compliance with Home Oxygen Use Yes               Goals/Expected Outcomes   Short Term Goals To learn and understand importance of monitoring SPO2 with pulse oximeter and demonstrate accurate use of the pulse oximeter.;To learn and understand importance of maintaining oxygen saturations>88%;To learn and exhibit compliance with exercise, home and travel O2 prescription;To learn and demonstrate proper use of respiratory medications;To learn and demonstrate proper pursed lip breathing techniques or other breathing techniques.        Long  Term Goals Exhibits compliance with exercise, home  and travel O2 prescription;Verbalizes importance of monitoring SPO2 with pulse oximeter and return demonstration;Maintenance of O2 saturations>88%;Exhibits proper breathing techniques, such as  pursed lip breathing or other method taught during program session;Compliance with respiratory medication;Demonstrates proper use of MDI's       Comments Garrett reports no problems with using O2 at home and tracks his O2 with pulse oximeter. They have him trying new medication this week, doesn't know what it is. Continues to practice PLB.       Goals/Expected Outcomes Short: Work on breathing techniques, try new medication  Long: Continue to work on breathing.                Oxygen Discharge (Final Oxygen Re-Evaluation):  Oxygen Re-Evaluation - 12/26/20 0931       Program Oxygen Prescription  Program Oxygen Prescription Continuous;E-Tanks    Liters per minute 4      Home Oxygen   Home Oxygen Device Home Concentrator;E-Tanks    Sleep Oxygen Prescription Continuous    Liters per minute 4    Home Exercise Oxygen Prescription Continuous    Liters per minute 4    Home Resting Oxygen Prescription Continuous    Liters per minute 4    Compliance with Home Oxygen Use Yes      Goals/Expected Outcomes   Short Term Goals To learn and understand importance of monitoring SPO2 with pulse oximeter and demonstrate accurate use of the pulse oximeter.;To learn and understand importance of maintaining oxygen saturations>88%;To learn and exhibit compliance with exercise, home and travel O2 prescription;To learn and demonstrate proper use of respiratory medications;To learn and demonstrate proper pursed lip breathing techniques or other breathing techniques.     Long  Term Goals Exhibits compliance with exercise, home  and travel O2 prescription;Verbalizes importance of monitoring SPO2 with pulse oximeter and return demonstration;Maintenance of O2 saturations>88%;Exhibits proper breathing techniques, such as pursed lip breathing or other method taught during program session;Compliance with respiratory medication;Demonstrates proper use of MDI's    Comments Garrett reports no problems with using O2 at home and  tracks his O2 with pulse oximeter. They have him trying new medication this week, doesn't know what it is. Continues to practice PLB.    Goals/Expected Outcomes Short: Work on breathing techniques, try new medication  Long: Continue to work on breathing.             Initial Exercise Prescription:  Initial Exercise Prescription - 08/10/20 0900       Date of Initial Exercise RX and Referring Provider   Date 08/10/20    Referring Provider Garrett Males MD      Oxygen   Oxygen Continuous    Liters 4      Treadmill   MPH 1.5    Grade 0    Minutes 15   2 min walk 1 min rest   METs 2.15      Recumbant Bike   Level 1    RPM 50    Watts 10    Minutes 15    METs 2      NuStep   Level 1    SPM 80    Minutes 15    METs 2      Prescription Details   Frequency (times per week) 3    Duration Progress to 30 minutes of continuous aerobic without signs/symptoms of physical distress      Intensity   THRR 40-80% of Max Heartrate 92-127    Ratings of Perceived Exertion 11-13    Perceived Dyspnea 0-4      Progression   Progression Continue to progress workloads to maintain intensity without signs/symptoms of physical distress.      Resistance Training   Training Prescription Yes    Weight 3 lb    Reps 10-15             Perform Capillary Blood Glucose checks as needed.  Exercise Prescription Changes:   Exercise Prescription Changes     Row Name 08/10/20 0900 08/21/20 0800 09/04/20 1500 10/02/20 1600 10/16/20 1100     Response to Exercise   Blood Pressure (Admit) 132/64 138/74 134/62 132/60 126/74   Blood Pressure (Exercise) 148/74 160/76 162/72 142/84 150/82   Blood Pressure (Exit) 124/72 122/60 122/64 142/70 132/72   Heart Rate (Admit) 57 bpm  59 bpm 69 bpm 60 bpm 78 bpm   Heart Rate (Exercise) 89 bpm 85 bpm 107 bpm 80 bpm 128 bpm   Heart Rate (Exit) 68 bpm 75 bpm 100 bpm 75 bpm 78 bpm   Oxygen Saturation (Admit) 98 % 95 % 98 % 96 % 91 %   Oxygen Saturation  (Exercise) 79 % 86 % 89 % 87 % --   Oxygen Saturation (Exit) 95 % 95 % 90 % 96 % 98 %   Rating of Perceived Exertion (Exercise) _0 Perceived Dyspnea (Exercise) _1 0 --   Symptoms SOB, chest tightness 4/10 SOB SOB SOB SOB   Comments walk test results third day from last day attended 4/11 -- --   Duration -- Progress to 30 minutes of  aerobic without signs/symptoms of physical distress Progress to 30 minutes of  aerobic without signs/symptoms of physical distress Progress to 30 minutes of  aerobic without signs/symptoms of physical distress Progress to 30 minutes of  aerobic without signs/symptoms of physical distress   Intensity -- THRR unchanged THRR unchanged THRR unchanged THRR unchanged     Progression   Progression -- Continue to progress workloads to maintain intensity without signs/symptoms of physical distress. Continue to progress workloads to maintain intensity without signs/symptoms of physical distress. Continue to progress workloads to maintain intensity without signs/symptoms of physical distress. Continue to progress workloads to maintain intensity without signs/symptoms of physical distress.   Average METs -- 2.07 1.9 1.95 2     Resistance Training   Training Prescription -- Yes Yes Yes Yes   Weight -- 3 lb 3 lb 3 lb 3 lb   Reps -- 10-15 10-15 10-15 10-15     Interval Training   Interval Training -- -- No No No     Oxygen   Oxygen -- Continuous Continuous Continuous Continuous   Liters -- _2 Treadmill   MPH -- 1.5 -- -- --   Grade -- 0 -- -- --   Minutes -- 15 -- -- --   METs -- 2.15 -- -- --     Recumbant Bike   Level -- -- 1 -- --   Minutes -- -- 15 -- --     NuStep   Level -- 3 3 -- 3   Minutes -- 15 15 -- 15   METs -- 2 1.9 -- 2     T5 Nustep   Level -- -- -- 1 --   Minutes -- -- -- 15 --   METs -- -- -- 1.9 --     Biostep-RELP   Level -- -- -- 1 1   Minutes -- -- -- 15 15   METs -- -- -- 2 --    Row Name 10/31/20 1400  11/16/20 0700 11/29/20 1300 12/12/20 1200 12/25/20 1100     Response to Exercise   Blood Pressure (Admit) 140/68 118/76 140/86 122/68 130/80   Blood Pressure (Exit) 110/62 112/64 128/60 122/72 104/58   Heart Rate (Admit) 66 bpm 78 bpm 76 bpm 70 bpm 64 bpm   Heart Rate (Exercise) 83 bpm 89 bpm 92 bpm 94 bpm 78 bpm   Heart Rate (Exit) 80 bpm 83 bpm 91 bpm 85 bpm 75 bpm   Oxygen Saturation (Admit) 97 % 94 % 98 % 97 % 98 %   Oxygen Saturation (Exercise) 88 % 91 % 91 % 93 % 89 %   Oxygen  Saturation (Exit) 91 % 92 % 94 % 93 % 92 %   Rating of Perceived Exertion (Exercise) _0 Perceived Dyspnea (Exercise) _1 -- 3   Symptoms SOB SOB SOB -- SOB   Duration Continue with 30 min of aerobic exercise without signs/symptoms of physical distress. Continue with 30 min of aerobic exercise without signs/symptoms of physical distress. Continue with 30 min of aerobic exercise without signs/symptoms of physical distress. Continue with 30 min of aerobic exercise without signs/symptoms of physical distress. Continue with 30 min of aerobic exercise without signs/symptoms of physical distress.   Intensity _2      Progression   Progression Continue to progress workloads to maintain intensity without signs/symptoms of physical distress. Continue to progress workloads to maintain intensity without signs/symptoms of physical distress. Continue to progress workloads to maintain intensity without signs/symptoms of physical distress. Continue to progress workloads to maintain intensity without signs/symptoms of physical distress. Continue to progress workloads to maintain intensity without signs/symptoms of physical distress.   Average METs 2.3 2.23 1.9 2 1.9     Resistance Training   Training Prescription _3    Weight 3 lb 3 lb 3 lb 3 lb 4 lb   Reps 10-15 10-15 10-15 10-15 10-15     Interval Training   Interval Training  _4      Oxygen   Oxygen _5    Liters _6 NuStep   Level 5 5 -- 3 3   Minutes 30 30 -- 30 30   METs 2.4 2.5 -- 2 2     REL-XR   Level -- -- 1 -- 3   Minutes -- -- 30 -- 15   METs -- -- 2.2 -- 2.7     T5 Nustep   Level _7 -- 1   Minutes _8 -- 30   METs 1.6 1.9 1.6 -- 2     Home Exercise Plan   Plans to continue exercise at Home (comment)  bike Home (comment)  bike Home (comment)  bike Home (comment)  bike Home (comment)  bike   Frequency Add 2 additional days to program exercise sessions. Add 2 additional days to program exercise sessions. Add 2 additional days to program exercise sessions. Add 2 additional days to program exercise sessions. Add 2 additional days to program exercise sessions.   Initial Home Exercises Provided 10/17/20 10/17/20 10/17/20 10/17/20 10/17/20    Row Name 01/08/21 1000             Response to Exercise   Blood Pressure (Admit) 140/70       Blood Pressure (Exercise) 160/64       Blood Pressure (Exit) 140/80       Heart Rate (Admit) 90 bpm       Heart Rate (Exercise) 99 bpm       Heart Rate (Exit) 94 bpm       Oxygen Saturation (Admit) 94 %       Oxygen Saturation (Exercise) 92 %       Oxygen Saturation (Exit) 89 %       Rating of Perceived Exertion (Exercise) 13       Perceived Dyspnea (Exercise) 2       Symptoms SOB       Comments walk test completed  Duration Continue with 30 min of aerobic exercise without signs/symptoms of physical distress.       Intensity THRR unchanged               Progression   Progression Continue to progress workloads to maintain intensity without signs/symptoms of physical distress.       Average METs 2.3               Resistance Training   Training Prescription Yes       Weight 4 lb       Reps 10-15               Interval Training   Interval Training No               Oxygen   Oxygen Continuous       Liters 4                NuStep   Level 4       Minutes 30       METs 2.3               Home Exercise Plan   Plans to continue exercise at Home (comment)  bike       Frequency Add 2 additional days to program exercise sessions.       Initial Home Exercises Provided 10/17/20                Exercise Comments:   Exercise Comments     Row Name 08/14/20 0810 08/21/20 0814         Exercise Comments First full day of exercise!  Patient was oriented to gym and equipment including functions, settings, policies, and procedures.  Patient's individual exercise prescription and treatment plan were reviewed.  All starting workloads were established based on the results of the 6 minute walk test done at initial orientation visit.  The plan for exercise progression was also introduced and progression will be customized based on patient's performance and goals. Forgets to replace oxygen cannula after blowing nose.  Sats drop to low 80's               Exercise Goals and Review:   Exercise Goals     Row Name 08/10/20 1000             Exercise Goals   Increase Physical Activity Yes       Intervention Provide advice, education, support and counseling about physical activity/exercise needs.;Develop an individualized exercise prescription for aerobic and resistive training based on initial evaluation findings, risk stratification, comorbidities and participant's personal goals.       Expected Outcomes Short Term: Attend rehab on a regular basis to increase amount of physical activity.;Long Term: Add in home exercise to make exercise part of routine and to increase amount of physical activity.;Long Term: Exercising regularly at least 3-5 days a week.       Increase Strength and Stamina Yes       Intervention Develop an individualized exercise prescription for aerobic and resistive training based on initial evaluation findings, risk stratification, comorbidities and participant's personal goals.;Provide  advice, education, support and counseling about physical activity/exercise needs.       Expected Outcomes Short Term: Increase workloads from initial exercise prescription for resistance, speed, and METs.;Short Term: Perform resistance training exercises routinely during rehab and add in resistance training at home;Long Term: Improve cardiorespiratory fitness, muscular endurance and strength as measured by increased METs and functional  capacity (6MWT)       Able to understand and use rate of perceived exertion (RPE) scale Yes       Intervention Provide education and explanation on how to use RPE scale       Expected Outcomes Short Term: Able to use RPE daily in rehab to express subjective intensity level;Long Term:  Able to use RPE to guide intensity level when exercising independently       Able to understand and use Dyspnea scale Yes       Intervention Provide education and explanation on how to use Dyspnea scale       Expected Outcomes Short Term: Able to use Dyspnea scale daily in rehab to express subjective sense of shortness of breath during exertion;Long Term: Able to use Dyspnea scale to guide intensity level when exercising independently       Knowledge and understanding of Target Heart Rate Range (THRR) Yes       Intervention Provide education and explanation of THRR including how the numbers were predicted and where they are located for reference       Expected Outcomes Short Term: Able to state/look up THRR;Short Term: Able to use daily as guideline for intensity in rehab;Long Term: Able to use THRR to govern intensity when exercising independently       Able to check pulse independently Yes       Intervention Provide education and demonstration on how to check pulse in carotid and radial arteries.;Review the importance of being able to check your own pulse for safety during independent exercise       Expected Outcomes Short Term: Able to explain why pulse checking is important during  independent exercise;Long Term: Able to check pulse independently and accurately       Understanding of Exercise Prescription Yes       Intervention Provide education, explanation, and written materials on patient's individual exercise prescription       Expected Outcomes Short Term: Able to explain program exercise prescription;Long Term: Able to explain home exercise prescription to exercise independently                Exercise Goals Re-Evaluation :  Exercise Goals Re-Evaluation     Row Name 08/14/20 0810 08/21/20 0845 09/04/20 1542 09/26/20 0939 10/02/20 1558     Exercise Goal Re-Evaluation   Exercise Goals Review Able to understand and use rate of perceived exertion (RPE) scale;Knowledge and understanding of Target Heart Rate Range (THRR);Understanding of Exercise Prescription;Able to understand and use Dyspnea scale Increase Physical Activity;Increase Strength and Stamina -- Increase Physical Activity;Increase Strength and Stamina Increase Physical Activity;Increase Strength and Stamina;Understanding of Exercise Prescription   Comments Reviewed RPE and dyspnea scales, THR and program prescription with pt today.  Pt voiced understanding and was given a copy of goals to take home. Garrett Peterson has had some difficulty with exercise.  His oxygen has dropped to 86%.  Staff reviewed PLB and reminded him to keep cannula in place as it has not been at times during exercise. Out since last review Garrett is back from being sick and now Garrett is ready to get back to exercise. Garrett Peterson has attended twice since his return.  Garrett will need to attend consistently again. We will start to increase workloads and monitor his progress.   Expected Outcomes Short: Use RPE daily to regulate intensity. Long: Follow program prescription in THR. Short: continue to work on PLB Long:  Build overall stamina -- Short: attend [facility] regularly. Long:  maintain exercise regularly independently. Short: Begin to increase workloads Long:  Continue to improve stamina    Row Name 10/16/20 1106 10/17/20 1029 10/31/20 1418 11/16/20 0755 11/16/20 0922     Exercise Goal Re-Evaluation   Exercise Goals Review Increase Physical Activity;Increase Strength and Stamina Increase Physical Activity;Increase Strength and Stamina Increase Physical Activity;Increase Strength and Stamina;Understanding of Exercise Prescription Increase Physical Activity;Increase Strength and Stamina;Understanding of Exercise Prescription Increase Physical Activity;Increase Strength and Stamina;Understanding of Exercise Prescription   Comments Garrett Peterson has attended two times last week but missed the week before.  Staff have spoken with him about consistent attendance. Reviewed home exercise with pt today.  Pt plans to use bike at home and join the [employer] for exercise.  Reviewed THR, pulse, RPE, sign and symptoms, pulse oximetery and when to call 911 or MD.  Also discussed weather considerations and indoor options.  Pt voiced understanding. Garrett Peterson is doing well in rehab. Garrett is up to level 5 on the NuStep.  We will continue to montior his progress. Garrett Peterson is working in appropriate RPE range at rehab. Garrett continues to rest and use PLB if O2 drops below 88%. Garrett is compliant about watching his saturations. Will continue to monitor Garrett Peterson is doing well in rehab.  Garrett feels like Garrett is doing well other than his breathing.  Garrett is supposed to start a new med soon.  Garrett is riding his bike for 30 min on his off days and using some weights from [place].   Expected Outcomes Short: attend at least 6 times per month Long:improve stamina Short: add one day of exercise at home per week Long: improve SOB with ADLS Short: Increase hand weights Long: Conitnue to improve stamina Short: Increase load on T5 Long: Continue to increase overall MET level Short: Continue to add in exercise at home Long: Continue to improve stamina    Row Name 11/29/20 1319 12/05/20 0941 12/12/20 1300 12/25/20 1141 12/26/20 0928      Exercise Goal Re-Evaluation   Exercise Goals Review Increase Physical Activity;Increase Strength and Stamina;Understanding of Exercise Prescription Increase Physical Activity;Increase Strength and Stamina;Understanding of Exercise Prescription Increase Physical Activity;Increase Strength and Stamina Increase Physical Activity;Increase Strength and Stamina Increase Physical Activity;Increase Strength and Stamina   Comments Garrett Peterson is doing well in rehab.  We moved him up to the XR from the steppers yesterday and Garrett seemed to enjoy it.  Garrett was able to do it the whole time.  We will continue to encourage him to use the treadmill more.  We will continue to monitor his progress. Garrett Peterson reports working on the stationary bike at home - 30 minutes most days not at rehab; reminded about having warm up and cool down. Garrett Peterson should be able to increase levels on machines - Garrett has been at same level 4 weeks.  Staff will encourage increasing weights for strength work and continue to try walking. Garrett Peterson would benefit from walking at rehab- though Garrett typically stays on 1 machine the whole 30 minutes. Garrett should try to walk as tolerated, keeping O2 saturations above 88%. Garrett did increase to level 3 on the XR. Will continue to monitor. Stationary bike at home when not at rehab (30 or more minutes) (RPE 11) Garrett has a pulse ox at home, but does not track HR.   Expected Outcomes Short: Continue to attempt treadmill  Long: Continue to improve stamina Short: Continue to attempt treadmill, continue home exercise, include resistance training.  Long: Continue to improve stamina Short: increase  levels on seated machines Long:  improve overall stamina Short: Try walking as tolerated Long: Continue to increase overall MET level ST: stay within [facility] at home 92-127 LT: continue to increase MET level    Row Name 01/08/21 1025             Exercise Goal Re-Evaluation   Exercise Goals Review Increase Physical Activity;Increase Strength and Stamina        Comments Garrett Peterson will complete [facility] soon.  Garrett did improve his walk test by 80 feet!       Expected Outcomes Short: complete HT Long: maintain exercise independently                Discharge Exercise Prescription (Final Exercise Prescription Changes):  Exercise Prescription Changes - 01/08/21 1000       Response to Exercise   Blood Pressure (Admit) 140/70    Blood Pressure (Exercise) 160/64    Blood Pressure (Exit) 140/80    Heart Rate (Admit) 90 bpm    Heart Rate (Exercise) 99 bpm    Heart Rate (Exit) 94 bpm    Oxygen Saturation (Admit) 94 %    Oxygen Saturation (Exercise) 92 %    Oxygen Saturation (Exit) 89 %    Rating of Perceived Exertion (Exercise) 13    Perceived Dyspnea (Exercise) 2    Symptoms SOB    Comments walk test completed    Duration Continue with 30 min of aerobic exercise without signs/symptoms of physical distress.    Intensity THRR unchanged      Progression   Progression Continue to progress workloads to maintain intensity without signs/symptoms of physical distress.    Average METs 2.3      Resistance Training   Training Prescription Yes    Weight 4 lb    Reps 10-15      Interval Training   Interval Training No      Oxygen   Oxygen Continuous    Liters 4      NuStep   Level 4    Minutes 30    METs 2.3      Home Exercise Plan   Plans to continue exercise at Home (comment)   bike   Frequency Add 2 additional days to program exercise sessions.    Initial Home Exercises Provided 10/17/20             Nutrition:  Target Goals: Understanding of nutrition guidelines, daily intake of sodium <151m, cholesterol <2092m calories 30% from fat and 7% or less from saturated fats, daily to have 5 or more servings of fruits and vegetables.  Education: All About Nutrition: -Group instruction provided by verbal, written material, interactive activities, discussions, models, and posters to present general guidelines for heart healthy  nutrition including fat, fiber, MyPlate, the role of sodium in heart healthy nutrition, utilization of the nutrition label, and utilization of this knowledge for meal planning. Follow up email sent as well. Written material given at graduation.   Biometrics:  Pre Biometrics - 08/10/20 1001       Pre Biometrics   Height 5' 8.9" (1.75 m)    Weight 228 lb (103.4 kg)    BMI (Calculated) 33.77    Single Leg Stand 5.4 seconds             Post Biometrics - 01/04/21 1131        Post  Biometrics   Height _0  (1.727 m)    Weight 223 lb 3.2 oz (101.2 kg)  BMI (Calculated) 33.95             Nutrition Therapy Plan and Nutrition Goals:  Nutrition Therapy & Goals - 08/16/20 1135       Nutrition Therapy   Diet Heart healthy, low Na    Drug/Food Interactions Statins/Certain Fruits    Protein (specify units) 80g    Fiber 30 grams    Whole Grain Foods 3 servings    Saturated Fats 12 max. grams    Fruits and Vegetables 8 servings/day    Sodium 1.5 grams      Personal Nutrition Goals   Nutrition Goal ST: try diabetes friendly desserts from handout, Add different nonstarchy vegetables to 5 or more meals per week LT:    Comments Breakfast: bacon, egg, and sausage sandwich or scrambled eggs or cornflakes (adds sugar and milk) Lunch: part of sandwich and crackers Dinner: meats with potatoes butter beans or corn, grilled cheese sandwich. Take out on the weekends. Snacks: sweets. Drinks: water and soda (zero) and tea (sweet tea - mcdonalds) and coffee (black). Reports T2DM (lifestyle controlled - not sure what A1C is and does not check BG at home), high cholesterol (lipitor has been helping). Garrett had open heart surgery in the past. Discussed heart healthy eating and pulmonary MNT. Garrett Peterson is interested in making changes.      Intervention Plan   Intervention Nutrition handout(s) given to patient.;Prescribe, educate and counsel regarding individualized specific dietary modifications aiming  towards targeted core components such as weight, hypertension, lipid management, diabetes, heart failure and other comorbidities.    Expected Outcomes Short Term Goal: Understand basic principles of dietary content, such as calories, fat, sodium, cholesterol and nutrients.;Short Term Goal: A plan has been developed with personal nutrition goals set during dietitian appointment.;Long Term Goal: Adherence to prescribed nutrition plan.             Nutrition Assessments:  MEDIFICTS Score Key: ?70 Need to make dietary changes  40-70 Heart Healthy Diet ? 40 Therapeutic Level Cholesterol Diet  Flowsheet Row Pulmonary Rehab from 08/10/2020 in [facility]  Picture Your Plate Total Score on Admission 55      Picture Your Plate Scores: <78 Unhealthy dietary pattern with much room for improvement. 41-50 Dietary pattern unlikely to meet recommendations for good health and room for improvement. 51-60 More healthful dietary pattern, with some room for improvement.  >60 Healthy dietary pattern, although there may be some specific behaviors that could be improved.   Nutrition Goals Re-Evaluation:  Nutrition Goals Re-Evaluation     [place] Name 09/26/20 [phone] 10/17/20 0944 11/16/20 0932 12/05/20 0939 12/26/20 0927     Goals   Current Weight 226 lb (102.5 kg) -- -- -- 222 lb 14.4 oz (101.1 kg)   Nutrition Goal Lose more weight and make diet changes. -- Reduce sugar and small meals Sort: Consider working on reducing sweets Long: Conitnue to eat healthy. Sort: Consider working on reducing sweets Long: Conitnue to eat healthy.   Comment Garrett feels like Garrett heats too much bread and Garrett has sweets alot. Garrett is eating ice cream, cookies and other sweets daily. Garrett is going to try to cute back on the sweets. Garrett Peterson states Garrett eats pretty good. Garrett has cut back some on sweets.  Garrett will try to have only one sweet treat per day. Garrett Peterson eats fairly well.  We talked about eating less sweets and gassy foods.   Also review small meals to help with breathing. Garrett continues  to try to eat well normally Garrett Peterson reports that Garrett has no problems with his nutrition at this time. Garrett reports Garrett hasn't reduced his sweets, but is not interested in change at this time. Garrett wants to see the blood work his MD will do. Discussed importance of limiting added sugar and excess carbohydrates with breathing. Garrett Peterson reports still having no problems with nutrition and is not interested in making any changes at this time, Garrett has not had his bloodwork done yet. Reviewed importance of limiting excess added sugar and excess CHO for breathing.   Expected Outcome Short: cut back on sweets. Long: limit sweets to a minimum. Short: continue to owrk on reducing sweets Long: one sweet treat per day Sort: Work on reducing sweets Long: Conitnue to eat healthy. Sort: Consider working on reducing sweets Long: Conitnue to eat healthy. Sort: Consider working on reducing sweets Long: Conitnue to eat healthy.            Nutrition Goals Discharge (Final Nutrition Goals Re-Evaluation):  Nutrition Goals Re-Evaluation - 12/26/20 0927       Goals   Current Weight 222 lb 14.4 oz (101.1 kg)    Nutrition Goal Sort: Consider working on reducing sweets Long: Conitnue to eat healthy.    Comment Garrett Peterson reports still having no problems with nutrition and is not interested in making any changes at this time, Garrett has not had his bloodwork done yet. Reviewed importance of limiting excess added sugar and excess CHO for breathing.    Expected Outcome Sort: Consider working on reducing sweets Long: Conitnue to eat healthy.             Psychosocial: Target Goals: Acknowledge presence or absence of significant depression and/or stress, maximize coping skills, provide positive support system. Participant is able to verbalize types and ability to use techniques and skills needed for reducing stress and depression.   Education: Stress, Anxiety, and Depression - Group  verbal and visual presentation to define topics covered.  Reviews how body is impacted by stress, anxiety, and depression.  Also discusses healthy ways to reduce stress and to treat/manage anxiety and depression.  Written material given at graduation.   Education: Sleep Hygiene -Provides group verbal and written instruction about how sleep can affect your health.  Define sleep hygiene, discuss sleep cycles and impact of sleep habits. Review good sleep hygiene tips.    Initial Review & Psychosocial Screening:  Initial Psych Review & Screening - 08/04/20 1508       Initial Review   Current issues with Current Stress Concerns;Current Sleep Concerns    Source of Stress Concerns Unable to participate in former interests or hobbies;Unable to perform yard/household activities;Chronic Illness      Family Dynamics   Good Support System? Yes   God, wife, medical team     Barriers   Psychosocial barriers to participate in program There are no identifiable barriers or psychosocial needs.      Screening Interventions   Interventions Provide feedback about the scores to participant;Encouraged to exercise;To provide support and resources with identified psychosocial needs    Expected Outcomes Short Term goal: Utilizing psychosocial counselor, staff and physician to assist with identification of specific Stressors or current issues interfering with healing process. Setting desired goal for each stressor or current issue identified.;Long Term Goal: Stressors or current issues are controlled or eliminated.;Short Term goal: Identification and review with participant of any Quality of Life or Depression concerns found by scoring the questionnaire.;Long Term goal: The  participant improves quality of Life and PHQ9 Scores as seen by post scores and/or verbalization of changes             Quality of Life Scores:  Scores of 19 and below usually indicate a poorer quality of life in these areas.  A difference  of  2-3 points is a clinically meaningful difference.  A difference of 2-3 points in the total score of the Quality of Life Index has been associated with significant improvement in overall quality of life, self-image, physical symptoms, and general health in studies assessing change in quality of life.  PHQ-9: Recent Review Flowsheet Data     Depression screen [facility] 2/9 08/10/2020   Decreased Interest 0   Down, Depressed, Hopeless 0   PHQ - 2 Score 0   Altered sleeping 1   Tired, decreased energy 1   Change in appetite 0   Feeling bad or failure about yourself  0   Trouble concentrating 0   Moving slowly or fidgety/restless 0   Suicidal thoughts 0   PHQ-9 Score 2   Difficult doing work/chores Not difficult at all      Interpretation of Total Score  Total Score Depression Severity:  1-4 = Minimal depression, 5-9 = Mild depression, 10-14 = Moderate depression, 15-19 = Moderately severe depression, 20-27 = Severe depression   Psychosocial Evaluation and Intervention:  Psychosocial Evaluation - 08/04/20 1517       Psychosocial Evaluation & Interventions   Interventions Encouraged to exercise with the program and follow exercise prescription;Stress management education;Relaxation education    Comments Garrett Peterson has had a complicated medical history this last year and a half. Garrett was diagnosed with colon CA last year and then randomnly started experiencing severe shortness of breaht this last fall. 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 Garrett used to spend a lot of his time outdoors doing different activities, but now can't due to the oxygen tanks and shortness of breath. His sleep pattern has been unusual for the last few years with no recent change, where Garrett sleeps only a little some nights and then up the rest of the night. Garrett states his main support system is God and his wife, Garrett also is very [employer] attorney of his doctors. Garrett received news recently that Garrett was cancer free so now Garrett is  focused on his breathing issues. His main goal is to feel better and hopefully be able to wear a concentrator so Garrett can do more of his hobbies outside.    Expected Outcomes Short; attend pulmonary rehab for education and exercise. Long: develop positive self care habits.    Continue Psychosocial Services  Follow up required by staff             Psychosocial Re-Evaluation:  Psychosocial Re-Evaluation     [place] Name 09/26/20 [phone] 10/17/20 0942 11/16/20 0933 12/05/20 0941 12/26/20 0937     Psychosocial Re-Evaluation   Current issues with Current Stress Concerns;Current Sleep Concerns Current Stress Concerns;Current Sleep Concerns Current Stress Concerns;Current Sleep Concerns Current Stress Concerns Current Stress Concerns   Comments His shortness of breath and his age are starting to get to him. Garrett is not sure how much longer Garrett has left. Garrett states that Garrett has never been able to sleep good. Garrett has regrets of not being compassionate with his father and his lung disease. Garrett Peterson is feeling much better than Garrett was 3 weeks ago since Garrett has got over bronchitis.  Garrett is  determined to get better.  Garrett still does not sleep well.  Garrett states hes always been a light sleeper. Garrett Peterson is doing well, still frustrated with his breathing.  Overall, Garrett is doing well mentally.  Garrett still does not sleep well but has not talked to doctor about his sleep.  We also mentioned getting tools to help with breathing training at home. Garrett Peterson reports doing better with his stress as Garrett starts to get better and make progress. Garrett felt stressed when Garrett was active and then had to stop due to breathing. Garrett reports sleeping well most of the time. To help relax Garrett likes to watch TV. His wife is a good support system for him. Garrett Peterson reports finding his health conditions stressful and would like to move to a smaller tank so that Garrett is more mobile and Garrett could be more active. Garrett reports his wife is a good support system for him. Garrett doesn't have anything  that Garrett does to relax and Garrett tries to stay positive and pray. Garrett reports sleeping well.   Expected Outcomes Short: Continue to exercise regularly to support mental health and notify staff of any changes. Long: maintain mental health and well being through teaching of rehab or prescribed medications independently. Short: continue to attend LW Long: attend education to help manage stress Short: Talk to doctor about breathing and sleep. Long: Continue to focus on positive. Short: continue to exercise to help improve breathing Long: Continue to focus on positive. Short: continue to exercise to help improve breathing Long: Continue to focus on positive.   Interventions Encouraged to attend Pulmonary Rehabilitation for the exercise -- Encouraged to attend Pulmonary Rehabilitation for the exercise Encouraged to attend Pulmonary Rehabilitation for the exercise Encouraged to attend Pulmonary Rehabilitation for the exercise   Continue Psychosocial Services  Follow up required by staff -- Follow up required by staff Follow up required by staff Follow up required by staff     Initial Review   Source of Stress Concerns Chronic Illness -- -- Chronic Illness;Unable to participate in former interests or hobbies;Unable to perform yard/household activities Chronic Illness;Unable to participate in former interests or hobbies;Unable to perform yard/household activities            Psychosocial Discharge (Final Psychosocial Re-Evaluation):  Psychosocial Re-Evaluation - 12/26/20 0937       Psychosocial Re-Evaluation   Current issues with Current Stress Concerns    Comments Garrett Peterson reports finding his health conditions stressful and would like to move to a smaller tank so that Garrett is more mobile and Garrett could be more active. Garrett reports his wife is a good support system for him. Garrett doesn't have anything that Garrett does to relax and Garrett tries to stay positive and pray. Garrett reports sleeping well.    Expected Outcomes Short:  continue to exercise to help improve breathing Long: Continue to focus on positive.    Interventions Encouraged to attend Pulmonary Rehabilitation for the exercise    Continue Psychosocial Services  Follow up required by staff      Initial Review   Source of Stress Concerns Chronic Illness;Unable to participate in former interests or hobbies;Unable to perform yard/household activities             Education: Education Goals: Education classes will be provided on a weekly basis, covering required topics. Participant will state understanding/return demonstration of topics presented.  Learning Barriers/Preferences:  Learning Barriers/Preferences - 08/04/20 1508       Learning Barriers/Preferences  Learning Barriers None    Learning Preferences None             General Pulmonary Education Topics:  Infection Prevention: - Provides verbal and written material to individual with discussion of infection control including proper hand washing and proper equipment cleaning during exercise session. Flowsheet Row Pulmonary Rehab from 10/12/2020 in [facility] Cardiac and Pulmonary Rehab  Date 08/10/20  Educator [facility]  Instruction Review Code 1- Verbalizes Understanding       Falls Prevention: - Provides verbal and written material to individual with discussion of falls prevention and safety. Flowsheet Row Pulmonary Rehab from 10/12/2020 in [facility] Cardiac and Pulmonary Rehab  Date 08/10/20  Educator [facility]  Instruction Review Code 1- Verbalizes Understanding       Chronic Lung Disease Review: - Group verbal instruction with posters, models, PowerPoint presentations and videos,  to review new updates, new respiratory medications, new advancements in procedures and treatments. Providing information on websites and "800" numbers for continued self-education. Includes information about supplement oxygen, available portable oxygen systems, continuous and intermittent flow rates, oxygen safety,  concentrators, and Medicare reimbursement for oxygen. Explanation of Pulmonary Drugs, including class, frequency, complications, importance of spacers, rinsing mouth after steroid MDI's, and proper cleaning methods for nebulizers. Review of basic lung anatomy and physiology related to function, structure, and complications of lung disease. Review of risk factors. Discussion about methods for diagnosing sleep apnea and types of masks and machines for OSA. Includes a review of the use of types of environmental controls: home humidity, furnaces, filters, dust mite/pet prevention, HEPA vacuums. Discussion about weather changes, air quality and the benefits of nasal washing. Instruction on Warning signs, infection symptoms, calling MD promptly, preventive modes, and value of vaccinations. Review of effective airway clearance, coughing and/or vibration techniques. Emphasizing that all should Create an Action Plan. Written material given at graduation. Flowsheet Row Pulmonary Rehab from 10/12/2020 in [facility] Cardiac and Pulmonary Rehab  Education need identified 08/10/20  Date 10/12/20  Educator [facility]  Instruction Review Code 1- Verbalizes Understanding       AED/CPR: - Group verbal and written instruction with the use of models to demonstrate the basic use of the AED with the basic ABC's of resuscitation.    Anatomy and Cardiac Procedures: - Group verbal and visual presentation and models provide information about basic cardiac anatomy and function. Reviews the testing methods done to diagnose heart disease and the outcomes of the test results. Describes the treatment choices: Medical Management, Angioplasty, or Coronary Bypass Surgery for treating various heart conditions including Myocardial Infarction, Angina, Valve Disease, and Cardiac Arrhythmias.  Written material given at graduation.   Medication Safety: - Group verbal and visual instruction to review commonly prescribed medications for heart and lung  disease. Reviews the medication, class of the drug, and side effects. Includes the steps to properly store meds and maintain the prescription regimen.  Written material given at graduation.   Other: -Provides group and verbal instruction on various topics (see comments)   Knowledge Questionnaire Score:  Knowledge Questionnaire Score - 08/10/20 1002       Knowledge Questionnaire Score   Pre Score 13/15 Education Focus: O2 safety, exercise              Core Components/Risk Factors/Patient Goals at Admission:  Personal Goals and Risk Factors at Admission - 08/10/20 1002       Core Components/Risk Factors/Patient Goals on Admission    Weight Management Yes;Obesity;Weight Loss    Intervention Weight Management: Develop  a combined nutrition and exercise program designed to reach desired caloric intake, while maintaining appropriate intake of nutrient and fiber, sodium and fats, and appropriate energy expenditure required for the weight goal.;Weight Management: Provide education and appropriate resources to help participant work on and attain dietary goals.;Weight Management/Obesity: Establish reasonable short term and long term weight goals.;Obesity: Provide education and appropriate resources to help participant work on and attain dietary goals.    Admit Weight 228 lb (103.4 kg)    Goal Weight: Short Term 220 lb (99.8 kg)    Goal Weight: Long Term 215 lb (97.5 kg)    Expected Outcomes Short Term: Continue to assess and modify interventions until short term weight is achieved;Long Term: Adherence to nutrition and physical activity/exercise program aimed toward attainment of established weight goal;Weight Loss: Understanding of general recommendations for a balanced deficit meal plan, which promotes 1-2 lb weight loss per week and includes a negative energy balance of [phone] kcal/d;Understanding recommendations for meals to include 15-35% energy as protein, 25-35% energy from fat, 35-60%  energy from carbohydrates, less than 2103m of dietary cholesterol, 20-35 gm of total fiber daily;Understanding of distribution of calorie intake throughout the day with the consumption of 4-5 meals/snacks    Improve shortness of breath with ADL's Yes    Intervention Provide education, individualized exercise plan and daily activity instruction to help decrease symptoms of SOB with activities of daily living.    Expected Outcomes Short Term: Improve cardiorespiratory fitness to achieve a reduction of symptoms when performing ADLs;Long Term: Be able to perform more ADLs without symptoms or delay the onset of symptoms    Hypertension Yes    Intervention Provide education on lifestyle modifcations including regular physical activity/exercise, weight management, moderate sodium restriction and increased consumption of fresh fruit, vegetables, and low fat dairy, alcohol moderation, and smoking cessation.;Monitor prescription use compliance.    Expected Outcomes Short Term: Continued assessment and intervention until BP is < 140/924mHG in hypertensive participants. < 130/8030mG in hypertensive participants with diabetes, heart failure or chronic kidney disease.;Long Term: Maintenance of blood pressure at goal levels.    Lipids Yes    Intervention Provide education and support for participant on nutrition & aerobic/resistive exercise along with prescribed medications to achieve LDL <2m23mDL >40mg105m Expected Outcomes Short Term: Participant states understanding of desired cholesterol values and is compliant with medications prescribed. Participant is following exercise prescription and nutrition guidelines.;Long Term: Cholesterol controlled with medications as prescribed, with individualized exercise RX and with personalized nutrition plan. Value goals: LDL < 2mg,97m > 40 mg.             Education:Diabetes - Individual verbal and written instruction to review signs/symptoms of diabetes, desired  ranges of glucose level fasting, after meals and with exercise. Acknowledge that pre and post exercise glucose checks will be done for 3 sessions at entry of program.   Know Your Numbers and Heart Failure: - Group verbal and visual instruction to discuss disease risk factors for cardiac and pulmonary disease and treatment options.  Reviews associated critical values for Overweight/Obesity, Hypertension, Cholesterol, and Diabetes.  Discusses basics of heart failure: signs/symptoms and treatments.  Introduces Heart Failure Zone chart for action plan for heart failure.  Written material given at graduation.   Core Components/Risk Factors/Patient Goals Review:   Goals and Risk Factor Review     Row Name 09/26/20 0935 10/17/20 0939 11/16/20 0930 12/05/20 0934 12/26/20 0934     Core Components/Risk Factors/Patient Goals  Review   Personal Goals Review Improve shortness of breath with ADL's Improve shortness of breath with ADL's Improve shortness of breath with ADL's;Weight Management/Obesity;Hypertension Improve shortness of breath with ADL's;Weight Management/Obesity;Hypertension Improve shortness of breath with ADL's;Weight Management/Obesity;Hypertension   Review Spoke to patient about their shortness of breath and what they can do to improve. Patient has been informed of breathing techniques when starting the program. Patient is informed to tell staff if they have had any med changes and that certain meds they are taking or not taking can be causing shortness of breath. Peterson had a heart cath last week and his heart is great.  Garrett sees pulmonologist Friday who has also emphasized importance of exercise.  Garrett feels like Garrett can do more than Garrett could when Garrett started.  Garrett would  like to get down to 2L of oxygen.  We reviewed importance of consistent attendance. Garrett Peterson is down to 220 lb and holding steady.  His doctor would like for him to get down to 215 lb.  Garrett checks his pressures 2x a day and usually its  pretty good, highest has been 144.  His breathing is still his biggest limitation but Garrett has not been using PLB so we talked more about it today. Garrett reports tyvaso (5 breaths per day)  has been helping him breathe better. Garrett feels Garrett can breathe longer without giving out. Still having shortness of breath when reaching up overhead. Garrett continues to practice his PLB which Garrett feels helped, but not as much as the medication Garrett started. Garrett is wathcing out for side effects from medication- none so far. Garrett checks his BP at home, 124-130/70-80; today 118/66. Taking all medication as prescribed. Garrett sees his family MD friday. Garrett reports his weight has been stable Garrett checks his BP at home : usually about 128/75 (2x/day). Today BP in rehab was 140/70. Garrett continues to take his medications as prescibed, but his doctor would like to change his pulmonary medicaiton. His weight continues to be stable. Garrett feels like his breathing is getting a bit better, but Garrett feels like the inhaler Garrett takes is helping.   Expected Outcomes Short: Attend [facility] regularly to improve shortness of breath with ADL's. Long: maintain independence with ADL's Short: exercise at least 3 times a week Long: be able to reduce to 2L oxygen Short: Work on PLB Long: COnitnue to work on [employer] loss Short: Work on PLB Long: Continue to work on [employer] loss Short: continue to work on PLB, try out new medicaiton from MD Long: Continue to work on [employer] loss            Core Components/Risk Factors/Patient Goals at Discharge (Final Review):   Goals and Risk Factor Review - 12/26/20 0934       Core Components/Risk Factors/Patient Goals Review   Personal Goals Review Improve shortness of breath with ADL's;Weight Management/Obesity;Hypertension    Review Garrett checks his BP at home : usually about 128/75 (2x/day). Today BP in rehab was 140/70. Garrett continues to take his medications as prescibed, but his doctor would like to change his pulmonary medicaiton. His  weight continues to be stable. Garrett feels like his breathing is getting a bit better, but Garrett feels like the inhaler Garrett takes is helping.    Expected Outcomes Short: continue to work on PLB, try out new medicaiton from MD Long: Continue to work on weight loss             ITP Comments:  ITP Comments     Row Name 08/04/20 1504 08/10/20 0950 08/14/20 0810 08/16/20 1141 08/30/20 0949   ITP Comments Initial telephone orientation completed. Diagnosis can be found in [facility] 3/23. EP orientation scheduled for Thursday 3/31 at 8am. Completed 6MWT and gym orientation. Initial ITP created and sent for review to Dr. Emily Filbert, Medical Director. First full day of exercise!  Patient was oriented to gym and equipment including functions, settings, policies, and procedures.  Patient's individual exercise prescription and treatment plan were reviewed.  All starting workloads were established based on the results of the 6 minute walk test done at initial orientation visit.  The plan for exercise progression was also introduced and progression will be customized based on patient's performance and goals. Completed initial RD evaluation 30 Day review completed. Medical Director ITP review done, changes made as directed, and signed approval by Medical Director.    [place] Name 09/04/20 1542 09/27/20 0650 10/25/20 0730 11/22/20 0650 12/20/20 0714   ITP Comments Called to check on patient. Garrett was out sick last week with bronchitis.  His doctor cleared him to return once Garrett feels up to it.  Garrett is starting to feel better now.  Garrett hopes to return on Friday 4/29. 30 Day review completed. Medical Director ITP review done, changes made as directed, and signed approval by Medical Director.  1 visit in May 30 Day review completed. Medical Director ITP review done, changes made as directed, and signed approval by Medical Director. 30 Day review completed. Medical Director ITP review done, changes made as directed, and signed approval by  Medical Director. 30 Day review completed. Medical Director ITP review done, changes made as directed, and signed approval by Medical Director.    [place] Name 01/17/21 0621           ITP Comments 30 Day review completed. Medical Director ITP review done, changes made as directed, and signed approval by Medical Director.                Comments:

## 2021-01-18 ENCOUNTER — Other Ambulatory Visit: Payer: Self-pay

## 2021-01-18 ENCOUNTER — Encounter: Payer: Medicare Other | Admitting: *Deleted

## 2021-01-18 DIAGNOSIS — J9611 Chronic respiratory failure with hypoxia: Secondary | ICD-10-CM

## 2021-01-18 DIAGNOSIS — J849 Interstitial pulmonary disease, unspecified: Secondary | ICD-10-CM | POA: Diagnosis not present

## 2021-01-18 NOTE — Progress Notes (Signed)
Daily Session Note  Patient Details  Name: Garrett Peterson MRN: [id number] Date of Birth: 1945/11/28 Referring Provider:   Flowsheet Row Pulmonary Rehab from 08/10/2020 in [facility] Cardiac and Pulmonary Rehab  Referring Provider Brand Males MD       Encounter Date: 01/18/2021  Check In:  Session Check In - 01/18/21 0936       Check-In   Supervising physician immediately available to respond to emergencies See telemetry face sheet for immediately available ER MD    Location [facility]-Cardiac & Pulmonary Rehab    Staff Present Heath Lark, RN, BSN, CCRP;Laureen Owens Shark, BS, RRT, CPFT;Kelly Amedeo Plenty, BS, ACSM CEP, Exercise Physiologist    Virtual Visit No    Medication changes reported     No    Fall or balance concerns reported    No    Warm-up and Cool-down Performed on first and last piece of equipment    Resistance Training Performed Yes    VAD Patient? No    PAD/SET Patient? No      Pain Assessment   Currently in Pain? No/denies                Social History   Tobacco Use  Smoking Status Never  Smokeless Tobacco Former   Types: Chew    Goals Met:  Proper associated with RPD/PD & O2 Sat Independence with exercise equipment Exercise tolerated well No report of concerns or symptoms today  Goals Unmet:  Not Applicable  Comments: Pt able to follow exercise prescription today without complaint.  Will continue to monitor for progression.    Dr. Emily Filbert is Medical Director for [place].  Dr. Ottie Glazier is Medical Director for [facility] Pulmonary Rehabilitation.

## 2021-01-22 NOTE — Patient Instructions (Signed)
Discharge Patient Instructions  Patient Details  Name: Garrett Peterson MRN: [id number] Date of Birth: [birth year] Referring Provider:  Brand Males, MD   Number of Visits: 10  Reason for Discharge:  Patient reached a stable level of exercise. Patient independent in their exercise.  Smoking History:  Social History   Tobacco Use  Smoking Status Never  Smokeless Tobacco Former   Types: Chew    Diagnosis:  ILD (interstitial lung disease) ([place])  Chronic hypoxemic respiratory failure ([facility])  Initial Exercise Prescription:  Initial Exercise Prescription - 08/10/20 0900       Date of Initial Exercise RX and Referring Provider   Date 08/10/20    Referring Provider Garrett Males MD      Oxygen   Oxygen Continuous    Liters 4      Treadmill   MPH 1.5    Grade 0    Minutes 15   2 min walk 1 min rest   METs 2.15      Recumbant Bike   Level 1    RPM 50    Watts 10    Minutes 15    METs 2      NuStep   Level 1    SPM 80    Minutes 15    METs 2      Prescription Details   Frequency (times per week) 3    Duration Progress to 30 minutes of continuous aerobic without signs/symptoms of physical distress      Intensity   THRR 40-80% of Max Heartrate 92-127    Ratings of Perceived Exertion 11-13    Perceived Dyspnea 0-4      Progression   Progression Continue to progress workloads to maintain intensity without signs/symptoms of physical distress.      Resistance Training   Training Prescription Yes    Weight 3 lb    Reps 10-15             Discharge Exercise Prescription (Final Exercise Prescription Changes):  Exercise Prescription Changes - 01/08/21 1000       Response to Exercise   Blood Pressure (Admit) 140/70    Blood Pressure (Exercise) 160/64    Blood Pressure (Exit) 140/80    Heart Rate (Admit) 90 bpm    Heart Rate (Exercise) 99 bpm    Heart Rate (Exit) 94 bpm    Oxygen Saturation (Admit) 94 %    Oxygen Saturation (Exercise) 92 %     Oxygen Saturation (Exit) 89 %    Rating of Perceived Exertion (Exercise) 13    Perceived Dyspnea (Exercise) 2    Symptoms SOB    Comments walk test completed    Duration Continue with 30 min of aerobic exercise without signs/symptoms of physical distress.    Intensity THRR unchanged      Progression   Progression Continue to progress workloads to maintain intensity without signs/symptoms of physical distress.    Average METs 2.3      Resistance Training   Training Prescription Yes    Weight 4 lb    Reps 10-15      Interval Training   Interval Training No      Oxygen   Oxygen Continuous    Liters 4      NuStep   Level 4    Minutes 30    METs 2.3      Home Exercise Plan   Plans to continue exercise at Home (comment)   bike   Frequency  Add 2 additional days to program exercise sessions.    Initial Home Exercises Provided 10/17/20             Functional Capacity:  6 Minute Walk     Row Name 08/10/20 0952 01/04/21 1115       6 Minute Walk   Phase Initial Discharge    Distance 400 feet 480 feet    Distance % Change -- 20 %    Distance Feet Change -- 80 ft    Walk Time 3 minutes  stopped due to desaturation 3.5 minutes    # of Rest Breaks 0 2    MPH 1.52 1.5    METS 0.66 1.03    RPE 13 13    Perceived Dyspnea  3 2    VO2 Peak 2.29 3.63    Symptoms Yes (comment) Yes (comment)    Comments SOB, chest tightness 4/10 SOB    Resting HR 57 bpm 90 bpm    Resting BP 132/64 140/76    Resting Oxygen Saturation  98 % 96 %    Exercise Oxygen Saturation  during 6 min walk 79 % 83 %    Max Ex. HR 89 bpm 99 bpm    Max Ex. BP 148/74 160/64    2 Minute Post BP 136/70 --         Interval HR   1 Minute HR 74 97    2 Minute HR 84 94    3 Minute HR 89 95    4 Minute HR -- 97    5 Minute HR -- 97    6 Minute HR -- 99    2 Minute Post HR 71 --    Interval Heart Rate? Yes Yes         Interval Oxygen   Interval Oxygen? Yes Yes    Baseline Oxygen Saturation % 98 %  96 %    1 Minute Oxygen Saturation % 89 % 88 %    1 Minute Liters of Oxygen 4 L  continuous 4 L    2 Minute Oxygen Saturation % 83 % 83 %    2 Minute Liters of Oxygen 4 L 4 L    3 Minute Oxygen Saturation % 79 % 85 %    3 Minute Liters of Oxygen 4 L 4 L    4 Minute Oxygen Saturation % -- 83 %    4 Minute Liters of Oxygen -- 4 L    5 Minute Oxygen Saturation % -- 85 %    5 Minute Liters of Oxygen -- 4 L    6 Minute Oxygen Saturation % -- 81 %    6 Minute Liters of Oxygen -- 4 L    2 Minute Post Oxygen Saturation % 97 % 94 %    2 Minute Post Liters of Oxygen 4 L 4 L             Nutrition & Weight - Outcomes:  Pre Biometrics - 08/10/20 1001       Pre Biometrics   Height 5' 8.9" (1.75 m)    Weight 228 lb (103.4 kg)    BMI (Calculated) 33.77    Single Leg Stand 5.4 seconds             Post Biometrics - 01/04/21 1131        Post  Biometrics   Height _0  (1.727 m)    Weight 223 lb 3.2 oz (101.2 kg)  BMI (Calculated) 33.95             Nutrition:  Nutrition Therapy & Goals - 08/16/20 1135       Nutrition Therapy   Diet Heart healthy, low Na    Drug/Food Interactions Statins/Certain Fruits    Protein (specify units) 80g    Fiber 30 grams    Whole Grain Foods 3 servings    Saturated Fats 12 max. grams    Fruits and Vegetables 8 servings/day    Sodium 1.5 grams      Personal Nutrition Goals   Nutrition Goal ST: try diabetes friendly desserts from handout, Add different nonstarchy vegetables to 5 or more meals per week LT:    Comments Breakfast: bacon, egg, and sausage sandwich or scrambled eggs or cornflakes (adds sugar and milk) Lunch: part of sandwich and crackers Dinner: meats with potatoes butter beans or corn, grilled cheese sandwich. Take out on the weekends. Snacks: sweets. Drinks: water and soda (zero) and tea (sweet tea - mcdonalds) and coffee (black). Reports T2DM (lifestyle controlled - not sure what A1C is and does not check BG at home), high  cholesterol (lipitor has been helping). He had open heart surgery in the past. Discussed heart healthy eating and pulmonary MNT. Garrett Peterson is interested in making changes.      Intervention Plan   Intervention Nutrition handout(s) given to patient.;Prescribe, educate and counsel regarding individualized specific dietary modifications aiming towards targeted core components such as weight, hypertension, lipid management, diabetes, heart failure and other comorbidities.    Expected Outcomes Short Term Goal: Understand basic principles of dietary content, such as calories, fat, sodium, cholesterol and nutrients.;Short Term Goal: A plan has been developed with personal nutrition goals set during dietitian appointment.;Long Term Goal: Adherence to prescribed nutrition plan.             Goals reviewed with patient; copy given to patient.

## 2021-01-23 ENCOUNTER — Telehealth: Payer: Self-pay | Admitting: Internal Medicine

## 2021-01-23 NOTE — Telephone Encounter (Signed)
Continue tyvaso at full dose Reduce pirfenidone/Esbriet to 2 pills 3 times daily -> and hold there for the next 1 month  Plan  - Ensure he has a visit with me or nurse practitioner over the phone or video visit in 1 month to see how he is doing with the reduced pirfenidone before we increase it.  If he has a face-to-face visit sometime coming up in the next 4 weeks that is fine keep it

## 2021-01-23 NOTE — Telephone Encounter (Signed)
Called and spoke with patient regarding MR's recs. Patient verbalized understanding and agreed to recs. He was already scheduled  on 9/23.  Nothing further needed at this time.

## 2021-01-23 NOTE — Telephone Encounter (Signed)
Called and spoke with patient. He states he is on his 3rd week of Esbriet and his dosage was increased on Monday. Patient states it is making his stomach hurt and he is nauseous.  Patient also states that he is taking Tyvaso and is up to 12 mg 4x/day. He was unsure if he needed to continue using and how high of a dose he would be on. States he is not having any issues using Tyvaso.  Dr. Chase Caller please advise  Thanks

## 2021-01-30 ENCOUNTER — Other Ambulatory Visit: Payer: Self-pay

## 2021-01-30 DIAGNOSIS — J849 Interstitial pulmonary disease, unspecified: Secondary | ICD-10-CM

## 2021-01-30 DIAGNOSIS — J9611 Chronic respiratory failure with hypoxia: Secondary | ICD-10-CM

## 2021-01-30 NOTE — Progress Notes (Signed)
Discharge Progress Report  Patient Details  Name: Garrett Peterson MRN: [id number] Date of Birth: [birth year] Referring Provider:   Flowsheet Row Pulmonary Rehab from 08/10/2020 in [facility]  Referring Provider Brand Males MD        Number of Visits: 36  Reason for Discharge:  Patient reached a stable level of exercise. Patient independent in their exercise. Patient has met program and personal goals.  Smoking History:  Social History   Tobacco Use  Smoking Status Never  Smokeless Tobacco Former   Types: Chew    Diagnosis:  ILD (interstitial lung disease) ([place])  Chronic hypoxemic respiratory failure ([place])  ADL [facility]:  Pulmonary Assessment Scores     Row Name 08/10/20 1004 01/30/21 1010       ADL [facility]SD   ADL Phase Entry Exit    SOB Score total 66 53    Rest 0 1    Walk 1 2    Stairs 4 5    Bath 1 1    Dress 1 2    Shop 3 3         CAT Score   CAT Score 18 26         mMRC Score   mMRC Score 4 3             Initial Exercise Prescription:  Initial Exercise Prescription - 08/10/20 0900       Date of Initial Exercise RX and Referring Provider   Date 08/10/20    Referring Provider Brand Males MD      Oxygen   Oxygen Continuous    Liters 4      Treadmill   MPH 1.5    Grade 0    Minutes 15   2 min walk 1 min rest   METs 2.15      Recumbant Bike   Level 1    RPM 50    Watts 10    Minutes 15    METs 2      NuStep   Level 1    SPM 80    Minutes 15    METs 2      Prescription Details   Frequency (times per week) 3    Duration Progress to 30 minutes of continuous aerobic without signs/symptoms of physical distress      Intensity   THRR 40-80% of Max Heartrate 92-127    Ratings of Perceived Exertion 11-13    Perceived Dyspnea 0-4      Progression   Progression Continue to progress workloads to maintain intensity without signs/symptoms of physical distress.      Resistance Training   Training  Prescription Yes    Weight 3 lb    Reps 10-15             Discharge Exercise Prescription (Final Exercise Prescription Changes):  Exercise Prescription Changes - 01/08/21 1000       Response to Exercise   Blood Pressure (Admit) 140/70    Blood Pressure (Exercise) 160/64    Blood Pressure (Exit) 140/80    Heart Rate (Admit) 90 bpm    Heart Rate (Exercise) 99 bpm    Heart Rate (Exit) 94 bpm    Oxygen Saturation (Admit) 94 %    Oxygen Saturation (Exercise) 92 %    Oxygen Saturation (Exit) 89 %    Rating of Perceived Exertion (Exercise) 13    Perceived Dyspnea (Exercise) 2    Symptoms SOB  Comments walk test completed    Duration Continue with 30 min of aerobic exercise without signs/symptoms of physical distress.    Intensity THRR unchanged      Progression   Progression Continue to progress workloads to maintain intensity without signs/symptoms of physical distress.    Average METs 2.3      Resistance Training   Training Prescription Yes    Weight 4 lb    Reps 10-15      Interval Training   Interval Training No      Oxygen   Oxygen Continuous    Liters 4      NuStep   Level 4    Minutes 30    METs 2.3      Home Exercise Plan   Plans to continue exercise at Home (comment)   bike   Frequency Add 2 additional days to program exercise sessions.    Initial Home Exercises Provided 10/17/20             Functional Capacity:  6 Minute Walk     Row Name 08/10/20 0952 01/04/21 1115       6 Minute Walk   Phase Initial Discharge    Distance 400 feet 480 feet    Distance % Change -- 20 %    Distance Feet Change -- 80 ft    Walk Time 3 minutes  stopped due to desaturation 3.5 minutes    # of Rest Breaks 0 2    MPH 1.52 1.5    METS 0.66 1.03    RPE 13 13    Perceived Dyspnea  3 2    VO2 Peak 2.29 3.63    Symptoms Yes (comment) Yes (comment)    Comments SOB, chest tightness 4/10 SOB    Resting HR 57 bpm 90 bpm    Resting BP 132/64 140/76    Resting  Oxygen Saturation  98 % 96 %    Exercise Oxygen Saturation  during 6 min walk 79 % 83 %    Max Ex. HR 89 bpm 99 bpm    Max Ex. BP 148/74 160/64    2 Minute Post BP 136/70 --         Interval HR   1 Minute HR 74 97    2 Minute HR 84 94    3 Minute HR 89 95    4 Minute HR -- 97    5 Minute HR -- 97    6 Minute HR -- 99    2 Minute Post HR 71 --    Interval Heart Rate? Yes Yes         Interval Oxygen   Interval Oxygen? Yes Yes    Baseline Oxygen Saturation % 98 % 96 %    1 Minute Oxygen Saturation % 89 % 88 %    1 Minute Liters of Oxygen 4 L  continuous 4 L    2 Minute Oxygen Saturation % 83 % 83 %    2 Minute Liters of Oxygen 4 L 4 L    3 Minute Oxygen Saturation % 79 % 85 %    3 Minute Liters of Oxygen 4 L 4 L    4 Minute Oxygen Saturation % -- 83 %    4 Minute Liters of Oxygen -- 4 L    5 Minute Oxygen Saturation % -- 85 %    5 Minute Liters of Oxygen -- 4 L    6 Minute Oxygen Saturation % --  81 %    6 Minute Liters of Oxygen -- 4 L    2 Minute Post Oxygen Saturation % 97 % 94 %    2 Minute Post Liters of Oxygen 4 L 4 L             Psychological, QOL, Others - Outcomes: PHQ 2/9: Depression screen [facility] 2/9 01/30/2021 08/10/2020  Decreased Interest 1 0  Down, Depressed, Hopeless 1 0  PHQ - 2 Score 2 0  Altered sleeping 1 1  Tired, decreased energy 1 1  Change in appetite 2 0  Feeling bad or failure about yourself  2 0  Trouble concentrating 0 0  Moving slowly or fidgety/restless 0 0  Suicidal thoughts 0 0  PHQ-9 Score 8 2  Difficult doing work/chores Somewhat difficult Not difficult at all  Some recent data might be hidden    Quality of Life:    Nutrition & Weight - Outcomes:  Pre Biometrics - 08/10/20 1001       Pre Biometrics   Height 5' 8.9" (1.75 m)    Weight 228 lb (103.4 kg)    BMI (Calculated) 33.77    Single Leg Stand 5.4 seconds             Post Biometrics - 01/04/21 1131        Post  Biometrics   Height _0  (1.727 m)    Weight  223 lb 3.2 oz (101.2 kg)    BMI (Calculated) 33.95             Nutrition:  Nutrition Therapy & Goals - 08/16/20 1135       Nutrition Therapy   Diet Heart healthy, low Na    Drug/Food Interactions Statins/Certain Fruits    Protein (specify units) 80g    Fiber 30 grams    Whole Grain Foods 3 servings    Saturated Fats 12 max. grams    Fruits and Vegetables 8 servings/day    Sodium 1.5 grams      Personal Nutrition Goals   Nutrition Goal ST: try diabetes friendly desserts from handout, Add different nonstarchy vegetables to 5 or more meals per week LT:    Comments Breakfast: bacon, egg, and sausage sandwich or scrambled eggs or cornflakes (adds sugar and milk) Lunch: part of sandwich and crackers Dinner: meats with potatoes butter beans or corn, grilled cheese sandwich. Take out on the weekends. Snacks: sweets. Drinks: water and soda (zero) and tea (sweet tea - mcdonalds) and coffee (black). Reports T2DM (lifestyle controlled - not sure what A1C is and does not check BG at home), high cholesterol (lipitor has been helping). He had open heart surgery in the past. Discussed heart healthy eating and pulmonary MNT. [name] is interested in making changes.      Intervention Plan   Intervention Nutrition handout(s) given to patient.;Prescribe, educate and counsel regarding individualized specific dietary modifications aiming towards targeted core components such as weight, hypertension, lipid management, diabetes, heart failure and other comorbidities.    Expected Outcomes Short Term Goal: Understand basic principles of dietary content, such as calories, fat, sodium, cholesterol and nutrients.;Short Term Goal: A plan has been developed with personal nutrition goals set during dietitian appointment.;Long Term Goal: Adherence to prescribed nutrition plan.             Nutrition Discharge:   Education Questionnaire Score:  Knowledge Questionnaire Score - 01/30/21 1007       Knowledge  Questionnaire Score   Pre Score 13/15 Education  Focus: O2 safety, exercise    Post Score 13/18             Goals reviewed with patient; copy given to patient.

## 2021-01-30 NOTE — Progress Notes (Signed)
Pulmonary Individual Treatment Plan  Patient Details  Name: KHOEN GENET MRN: [id number] Date of Birth: May 27, 1945 Referring Provider:   Flowsheet Row Pulmonary Rehab from 08/10/2020 in [facility] Cardiac and Pulmonary Rehab  Referring Provider Brand Males MD       Initial Encounter Date:  Flowsheet Row Pulmonary Rehab from 08/10/2020 in [facility] Cardiac and Pulmonary Rehab  Date 08/10/20       Visit Diagnosis: ILD (interstitial lung disease) ([place])  Chronic hypoxemic respiratory failure ([place])  Patient's Home Medications on Admission:  Current Outpatient Medications:    aspirin EC 81 MG tablet, Take 1 tablet (81 mg total) by mouth daily. Swallow whole., Disp: , Rfl:    atorvastatin (LIPITOR) 20 MG tablet, Take 20 mg by mouth daily., Disp: , Rfl:    dextromethorphan-guaiFENesin (MUCINEX DM) 30-600 MG 12hr tablet, Take 1 tablet by mouth 2 (two) times daily as needed for cough., Disp: 60 tablet, Rfl: 0   furosemide (LASIX) 40 MG tablet, Take 1 tablet (40 mg total) by mouth daily., Disp: 30 tablet, Rfl: 5   metoprolol tartrate (LOPRESSOR) 25 MG tablet, Take 25 mg by mouth 2 (two) times daily., Disp: , Rfl:    mometasone (ELOCON) 0.1 % cream, Apply 1 application topically as directed. Qd to bid up to 5 days a week to aa psoriasis on arms until clear, then prn flares, Disp: 45 g, Rfl: 0   Pirfenidone (ESBRIET) 267 MG TABS, Take 1 tab by mouth three times daily for 7 days, then 2 tabs three times daily for 7 days, then 3 tabs three times daily thereafter., Disp: 207 tablet, Rfl: 0   predniSONE (DELTASONE) 5 MG tablet, TAKE 1 TABLET BY MOUTH DAILY WITH BREAKFAST, Disp: 30 tablet, Rfl: 2   temazepam (RESTORIL) 30 MG capsule, Take 30 mg by mouth at bedtime. , Disp: , Rfl:    Treprostinil (TYVASO) 0.6 MG/ML SOLN, Inhale into the lungs 4 (four) times daily., Disp: , Rfl:   Past Medical History: Past Medical History:  Diagnosis Date   (HFpEF) heart failure with preserved ejection fraction ([place])     a. 05/2020 Echo: EF 55-60%, no rwma, Gr1 DD, mildly red RV fxn; b. 09/2020 RHC: PCWP 61mHg. CO/CI 4.7/2.1.   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 09/2020 Cath: LM 70d, LAD 70ost, 80/713mLCX 100ost/p, 99p/m, RCA 100ost/p, VG->OM2 4066mIMA->LAD nl, VG->RPDA nl-->Med Rx.   [place]   rectal   Hyperlipidemia    Hypertension    Interstitial lung disease ([facility]  Pneumonia    Pre-diabetes    Respiratory failure with hypoxia (HCC)     Tobacco Use: Social History   Tobacco Use  Smoking Status Never  Smokeless Tobacco Former   Types: Chew    Labs: Recent Review Flowsheet Data   There is no flowsheet data to display.      Pulmonary Assessment Scores:  Pulmonary Assessment Scores     Row Name 08/10/20 1004 01/30/21 1010       ADL UCSD   ADL Phase Entry Exit    SOB Score total 66 53    Rest 0 1    Walk 1 2    Stairs 4 5    Bath 1 1    Dress 1 2  Shop 3 3         CAT Score   CAT Score 18 26         mMRC Score   mMRC Score 4 3             UCSD: Self-administered rating of dyspnea associated with activities of daily living (ADLs) 6-point scale (0 = "not at all" to 5 = "maximal or unable to do because of breathlessness")  Scoring Scores range from 0 to 120.  Minimally important difference is 5 units  CAT: CAT can identify the health impairment of COPD patients and is better correlated with disease progression.  CAT has a scoring range of zero to 40. The CAT score is classified into four groups of low (less than 10), medium (10 - 20), high (21-30) and very high (31-40) based on the impact level of disease on health status. A CAT score over 10 suggests significant symptoms.  A worsening CAT score could be explained by an exacerbation, poor medication adherence, poor inhaler technique, or progression of  COPD or comorbid conditions.  CAT MCID is 2 points  mMRC: mMRC (Modified Medical Research Council) Dyspnea Scale is used to assess the degree of baseline functional disability in patients of respiratory disease due to dyspnea. No minimal important difference is established. A decrease in score of 1 point or greater is considered a positive change.   Pulmonary Function Assessment:   Exercise Target Goals: Exercise Program Goal: Individual exercise prescription set using results from initial 6 min walk test and THRR while considering  patient's activity barriers and safety.   Exercise Prescription Goal: Initial exercise prescription builds to 30-45 minutes a day of aerobic activity, 2-3 days per week.  Home exercise guidelines will be given to patient during program as part of exercise prescription that the participant will acknowledge.  Education: Aerobic Exercise: - Group verbal and visual presentation on the components of exercise prescription. Introduces F.I.T.T principle from ACSM for exercise prescriptions.  Reviews F.I.T.T. principles of aerobic exercise including progression. Written material given at graduation. Flowsheet Row Pulmonary Rehab from 10/12/2020 in [facility] Cardiac and Pulmonary Rehab  Education need identified 08/10/20       Education: Resistance Exercise: - Group verbal and visual presentation on the components of exercise prescription. Introduces F.I.T.T principle from ACSM for exercise prescriptions  Reviews F.I.T.T. principles of resistance exercise including progression. Written material given at graduation.    Education: Exercise & Equipment Safety: - Individual verbal instruction and demonstration of equipment use and safety with use of the equipment. Flowsheet Row Pulmonary Rehab from 10/12/2020 in [facility] Cardiac and Pulmonary Rehab  Date 08/10/20  Educator [facility]  Instruction Review Code 1- Verbalizes Understanding       Education: Exercise Physiology & General  Exercise Guidelines: - Group verbal and written instruction with models to review the exercise physiology of the cardiovascular system and associated critical values. Provides general exercise guidelines with specific guidelines to those with heart or lung disease.    Education: Flexibility, Balance, Mind/Body Relaxation: - Group verbal and visual presentation with interactive activity on the components of exercise prescription. Introduces F.I.T.T principle from ACSM for exercise prescriptions. Reviews F.I.T.T. principles of flexibility and balance exercise training including progression. Also discusses the mind body connection.  Reviews various relaxation techniques to help reduce and manage stress (i.e. Deep breathing, progressive muscle relaxation, and visualization). Balance handout provided to take home. Written material given at graduation.   Activity Barriers & Risk Stratification:  Activity Barriers &  Cardiac Risk Stratification - 08/10/20 0954       Activity Barriers & Cardiac Risk Stratification   Activity Barriers Deconditioning;Shortness of Breath;Muscular Weakness;Balance Concerns             6 Minute Walk:  6 Minute Walk     Row Name 08/10/20 0952 01/04/21 1115       6 Minute Walk   Phase Initial Discharge    Distance 400 feet 480 feet    Distance % Change -- 20 %    Distance Feet Change -- 80 ft    Walk Time 3 minutes  stopped due to desaturation 3.5 minutes    # of Rest Breaks 0 2    MPH 1.52 1.5    METS 0.66 1.03    RPE 13 13    Perceived Dyspnea  3 2    VO2 Peak 2.29 3.63    Symptoms Yes (comment) Yes (comment)    Comments SOB, chest tightness 4/10 SOB    Resting HR 57 bpm 90 bpm    Resting BP 132/64 140/76    Resting Oxygen Saturation  98 % 96 %    Exercise Oxygen Saturation  during 6 min walk 79 % 83 %    Max Ex. HR 89 bpm 99 bpm    Max Ex. BP 148/74 160/64    2 Minute Post BP 136/70 --         Interval HR   1 Minute HR 74 97    2 Minute HR 84  94    3 Minute HR 89 95    4 Minute HR -- 97    5 Minute HR -- 97    6 Minute HR -- 99    2 Minute Post HR 71 --    Interval Heart Rate? Yes Yes         Interval Oxygen   Interval Oxygen? Yes Yes    Baseline Oxygen Saturation % 98 % 96 %    1 Minute Oxygen Saturation % 89 % 88 %    1 Minute Liters of Oxygen 4 L  continuous 4 L    2 Minute Oxygen Saturation % 83 % 83 %    2 Minute Liters of Oxygen 4 L 4 L    3 Minute Oxygen Saturation % 79 % 85 %    3 Minute Liters of Oxygen 4 L 4 L    4 Minute Oxygen Saturation % -- 83 %    4 Minute Liters of Oxygen -- 4 L    5 Minute Oxygen Saturation % -- 85 %    5 Minute Liters of Oxygen -- 4 L    6 Minute Oxygen Saturation % -- 81 %    6 Minute Liters of Oxygen -- 4 L    2 Minute Post Oxygen Saturation % 97 % 94 %    2 Minute Post Liters of Oxygen 4 L 4 L            Oxygen Initial Assessment:  Oxygen Initial Assessment - 08/10/20 1003       Home Oxygen   Home Oxygen Device E-Tanks    Sleep Oxygen Prescription Continuous    Liters per minute 4    Home Exercise Oxygen Prescription Continuous    Liters per minute 4    Home Resting Oxygen Prescription Continuous    Liters per minute 4    Compliance with Home Oxygen Use Yes      Initial   6 min Walk   Oxygen Used Continuous;E-Tanks    Liters per minute 4      Program Oxygen Prescription   Program Oxygen Prescription Continuous;E-Tanks    Liters per minute 4      Intervention   Short Term Goals To learn and exhibit compliance with exercise, home and travel O2 prescription;To learn and understand importance of monitoring SPO2 with pulse oximeter and demonstrate accurate use of the pulse oximeter.;To learn and understand importance of maintaining oxygen saturations>88%;To learn and demonstrate proper use of respiratory medications;To learn and demonstrate proper pursed lip breathing techniques or other breathing techniques.     Long  Term Goals Exhibits compliance with exercise, home   and travel O2 prescription;Verbalizes importance of monitoring SPO2 with pulse oximeter and return demonstration;Maintenance of O2 saturations>88%;Exhibits proper breathing techniques, such as pursed lip breathing or other method taught during program session;Compliance with respiratory medication;Demonstrates proper use of MDI's             Oxygen Re-Evaluation:  Oxygen Re-Evaluation     Row Name 08/14/20 0812 09/26/20 0942 10/17/20 1042 11/16/20 0923 12/05/20 0929     Program Oxygen Prescription   Program Oxygen Prescription Continuous;E-Tanks Continuous;E-Tanks Continuous;E-Tanks Continuous;E-Tanks Continuous;E-Tanks   Liters per minute _0 Home Oxygen   Home Oxygen Device E-Tanks Home Concentrator;E-Tanks Home Concentrator;E-Tanks Home Concentrator;E-Tanks Home Concentrator;E-Tanks   Sleep Oxygen Prescription _1    Liters per minute _2 Home Exercise Oxygen Prescription _3    Liters per minute _4 Home Resting Oxygen Prescription _5    Liters per minute _6 Compliance with Home Oxygen Use _7      Goals/Expected Outcomes   Short Term Goals To learn and demonstrate proper pursed lip breathing techniques or other breathing techniques.  Other Other To learn and understand importance of monitoring SPO2 with pulse oximeter and demonstrate accurate use of the pulse oximeter.;To learn and understand importance of maintaining oxygen saturations>88%;To learn and exhibit compliance with exercise, home and travel O2 prescription;To learn and demonstrate proper pursed lip breathing techniques or other breathing techniques. ;To learn and demonstrate proper use of respiratory medications To learn and understand importance of monitoring SPO2 with pulse oximeter and demonstrate accurate use of the  pulse oximeter.;To learn and understand importance of maintaining oxygen saturations>88%;To learn and exhibit compliance with exercise, home and travel O2 prescription;To learn and demonstrate proper use of respiratory medications;To learn and demonstrate proper pursed lip breathing techniques or other breathing techniques.    Long  Term Goals Exhibits proper breathing techniques, such as pursed lip breathing or other method taught during program session Other Other Exhibits compliance with exercise, home  and travel O2 prescription;Verbalizes importance of monitoring SPO2 with pulse oximeter and return demonstration;Maintenance of O2 saturations>88%;Exhibits proper breathing techniques, such as pursed lip breathing or other method taught during program session;Compliance with respiratory medication;Demonstrates proper use of MDI's Exhibits compliance with exercise, home  and travel O2 prescription;Verbalizes importance of monitoring SPO2 with pulse oximeter and return demonstration;Maintenance of O2 saturations>88%;Exhibits proper breathing techniques, such as pursed lip breathing or other method taught during program session;Compliance with respiratory medication;Demonstrates proper use of MDI's   Comments Reviewed PLB technique with pt.  Talked about how it works and it's importance in maintaining their exercise saturations. Patient has been sick with  Bronchitis and is now ready to resume rehab. He wants to get his shortness of breath under control and work on his breathing techniques. Brycen is glad to be back feeling better and is determined to feel better.  He sees pulmonologist Friday and wants to be able to use 2L of oxygen. [name] is doing well in rehab.  He is working on his breathing, still gets SOB when he reaches up overhead.  We talked more about using his PLB to help train breathing and getting insiprometer and flutter valve to help train his lungs too.  He really wants to get down to 2L. He reports  tyvaso (5 breaths per day)  has been helping him breathe better. He feels he can breathe longer without giving out. Still having shortness of breath when reaching up overhead. He continues to practice his PLB which he feels helped, but not as much as the medication he started.   Goals/Expected Outcomes Short: Become more profiecient at using PLB.   Long: Become independent at using PLB. Short: continue rehab and work on breathing techniques. Long: reduce shortness of breath. Short:  get back to regular exercise Long:  be able to use 2L oxygen Short: Work on breathing techniques Long: Continue to work on breathing. Short: Work on breathing techniques Long: Continue to work on breathing.    [place] Name 12/26/20 0931             Program Oxygen Prescription   Program Oxygen Prescription Continuous;E-Tanks       Liters per minute 4               Home Oxygen   Home Oxygen Device Home Concentrator;E-Tanks       Sleep Oxygen Prescription Continuous       Liters per minute 4       Home Exercise Oxygen Prescription Continuous       Liters per minute 4       Home Resting Oxygen Prescription Continuous       Liters per minute 4       Compliance with Home Oxygen Use Yes               Goals/Expected Outcomes   Short Term Goals To learn and understand importance of monitoring SPO2 with pulse oximeter and demonstrate accurate use of the pulse oximeter.;To learn and understand importance of maintaining oxygen saturations>88%;To learn and exhibit compliance with exercise, home and travel O2 prescription;To learn and demonstrate proper use of respiratory medications;To learn and demonstrate proper pursed lip breathing techniques or other breathing techniques.        Long  Term Goals Exhibits compliance with exercise, home  and travel O2 prescription;Verbalizes importance of monitoring SPO2 with pulse oximeter and return demonstration;Maintenance of O2 saturations>88%;Exhibits proper breathing techniques, such  as pursed lip breathing or other method taught during program session;Compliance with respiratory medication;Demonstrates proper use of MDI's       Comments He reports no problems with using O2 at home and tracks his O2 with pulse oximeter. They have him trying new medication this week, doesn't know what it is. Continues to practice PLB.       Goals/Expected Outcomes Short: Work on breathing techniques, try new medication  Long: Continue to work on breathing.                Oxygen Discharge (Final Oxygen Re-Evaluation):  Oxygen Re-Evaluation - 12/26/20 0931       Program Oxygen Prescription   Program  Oxygen Prescription Continuous;E-Tanks    Liters per minute 4      Home Oxygen   Home Oxygen Device Home Concentrator;E-Tanks    Sleep Oxygen Prescription Continuous    Liters per minute 4    Home Exercise Oxygen Prescription Continuous    Liters per minute 4    Home Resting Oxygen Prescription Continuous    Liters per minute 4    Compliance with Home Oxygen Use Yes      Goals/Expected Outcomes   Short Term Goals To learn and understand importance of monitoring SPO2 with pulse oximeter and demonstrate accurate use of the pulse oximeter.;To learn and understand importance of maintaining oxygen saturations>88%;To learn and exhibit compliance with exercise, home and travel O2 prescription;To learn and demonstrate proper use of respiratory medications;To learn and demonstrate proper pursed lip breathing techniques or other breathing techniques.     Long  Term Goals Exhibits compliance with exercise, home  and travel O2 prescription;Verbalizes importance of monitoring SPO2 with pulse oximeter and return demonstration;Maintenance of O2 saturations>88%;Exhibits proper breathing techniques, such as pursed lip breathing or other method taught during program session;Compliance with respiratory medication;Demonstrates proper use of MDI's    Comments He reports no problems with using O2 at home and  tracks his O2 with pulse oximeter. They have him trying new medication this week, doesn't know what it is. Continues to practice PLB.    Goals/Expected Outcomes Short: Work on breathing techniques, try new medication  Long: Continue to work on breathing.             Initial Exercise Prescription:  Initial Exercise Prescription - 08/10/20 0900       Date of Initial Exercise RX and Referring Provider   Date 08/10/20    Referring Provider Brand Males MD      Oxygen   Oxygen Continuous    Liters 4      Treadmill   MPH 1.5    Grade 0    Minutes 15   2 min walk 1 min rest   METs 2.15      Recumbant Bike   Level 1    RPM 50    Watts 10    Minutes 15    METs 2      NuStep   Level 1    SPM 80    Minutes 15    METs 2      Prescription Details   Frequency (times per week) 3    Duration Progress to 30 minutes of continuous aerobic without signs/symptoms of physical distress      Intensity   THRR 40-80% of Max Heartrate 92-127    Ratings of Perceived Exertion 11-13    Perceived Dyspnea 0-4      Progression   Progression Continue to progress workloads to maintain intensity without signs/symptoms of physical distress.      Resistance Training   Training Prescription Yes    Weight 3 lb    Reps 10-15             Perform Capillary Blood Glucose checks as needed.  Exercise Prescription Changes:   Exercise Prescription Changes     Row Name 08/10/20 0900 08/21/20 0800 09/04/20 1500 10/02/20 1600 10/16/20 1100     Response to Exercise   Blood Pressure (Admit) 132/64 138/74 134/62 132/60 126/74   Blood Pressure (Exercise) 148/74 160/76 162/72 142/84 150/82   Blood Pressure (Exit) 124/72 122/60 122/64 142/70 132/72   Heart Rate (Admit) 57 bpm 59  bpm 69 bpm 60 bpm 78 bpm   Heart Rate (Exercise) 89 bpm 85 bpm 107 bpm 80 bpm 128 bpm   Heart Rate (Exit) 68 bpm 75 bpm 100 bpm 75 bpm 78 bpm   Oxygen Saturation (Admit) 98 % 95 % 98 % 96 % 91 %   Oxygen Saturation  (Exercise) 79 % 86 % 89 % 87 % --   Oxygen Saturation (Exit) 95 % 95 % 90 % 96 % 98 %   Rating of Perceived Exertion (Exercise) _0 Perceived Dyspnea (Exercise) _1 0 --   Symptoms SOB, chest tightness 4/10 SOB SOB SOB SOB   Comments walk test results third day from last day attended 4/11 -- --   Duration -- Progress to 30 minutes of  aerobic without signs/symptoms of physical distress Progress to 30 minutes of  aerobic without signs/symptoms of physical distress Progress to 30 minutes of  aerobic without signs/symptoms of physical distress Progress to 30 minutes of  aerobic without signs/symptoms of physical distress   Intensity -- THRR unchanged THRR unchanged THRR unchanged THRR unchanged     Progression   Progression -- Continue to progress workloads to maintain intensity without signs/symptoms of physical distress. Continue to progress workloads to maintain intensity without signs/symptoms of physical distress. Continue to progress workloads to maintain intensity without signs/symptoms of physical distress. Continue to progress workloads to maintain intensity without signs/symptoms of physical distress.   Average METs -- 2.07 1.9 1.95 2     Resistance Training   Training Prescription -- Yes Yes Yes Yes   Weight -- 3 lb 3 lb 3 lb 3 lb   Reps -- 10-15 10-15 10-15 10-15     Interval Training   Interval Training -- -- No No No     Oxygen   Oxygen -- Continuous Continuous Continuous Continuous   Liters -- _2 Treadmill   MPH -- 1.5 -- -- --   Grade -- 0 -- -- --   Minutes -- 15 -- -- --   METs -- 2.15 -- -- --     Recumbant Bike   Level -- -- 1 -- --   Minutes -- -- 15 -- --     NuStep   Level -- 3 3 -- 3   Minutes -- 15 15 -- 15   METs -- 2 1.9 -- 2     T5 Nustep   Level -- -- -- 1 --   Minutes -- -- -- 15 --   METs -- -- -- 1.9 --     Biostep-RELP   Level -- -- -- 1 1   Minutes -- -- -- 15 15   METs -- -- -- 2 --    Row Name 10/31/20 1400  11/16/20 0700 11/29/20 1300 12/12/20 1200 12/25/20 1100     Response to Exercise   Blood Pressure (Admit) 140/68 118/76 140/86 122/68 130/80   Blood Pressure (Exit) 110/62 112/64 128/60 122/72 104/58   Heart Rate (Admit) 66 bpm 78 bpm 76 bpm 70 bpm 64 bpm   Heart Rate (Exercise) 83 bpm 89 bpm 92 bpm 94 bpm 78 bpm   Heart Rate (Exit) 80 bpm 83 bpm 91 bpm 85 bpm 75 bpm   Oxygen Saturation (Admit) 97 % 94 % 98 % 97 % 98 %   Oxygen Saturation (Exercise) 88 % 91 % 91 % 93 % 89 %   Oxygen Saturation (  Exit) 91 % 92 % 94 % 93 % 92 %   Rating of Perceived Exertion (Exercise) _0 Perceived Dyspnea (Exercise) _1 -- 3   Symptoms SOB SOB SOB -- SOB   Duration Continue with 30 min of aerobic exercise without signs/symptoms of physical distress. Continue with 30 min of aerobic exercise without signs/symptoms of physical distress. Continue with 30 min of aerobic exercise without signs/symptoms of physical distress. Continue with 30 min of aerobic exercise without signs/symptoms of physical distress. Continue with 30 min of aerobic exercise without signs/symptoms of physical distress.   Intensity _2      Progression   Progression Continue to progress workloads to maintain intensity without signs/symptoms of physical distress. Continue to progress workloads to maintain intensity without signs/symptoms of physical distress. Continue to progress workloads to maintain intensity without signs/symptoms of physical distress. Continue to progress workloads to maintain intensity without signs/symptoms of physical distress. Continue to progress workloads to maintain intensity without signs/symptoms of physical distress.   Average METs 2.3 2.23 1.9 2 1.9     Resistance Training   Training Prescription _3    Weight 3 lb 3 lb 3 lb 3 lb 4 lb   Reps 10-15 10-15 10-15 10-15 10-15     Interval Training   Interval Training  _4      Oxygen   Oxygen _5    Liters _6 NuStep   Level 5 5 -- 3 3   Minutes 30 30 -- 30 30   METs 2.4 2.5 -- 2 2     REL-XR   Level -- -- 1 -- 3   Minutes -- -- 30 -- 15   METs -- -- 2.2 -- 2.7     T5 Nustep   Level _7 -- 1   Minutes _8 -- 30   METs 1.6 1.9 1.6 -- 2     Home Exercise Plan   Plans to continue exercise at Home (comment)  bike Home (comment)  bike Home (comment)  bike Home (comment)  bike Home (comment)  bike   Frequency Add 2 additional days to program exercise sessions. Add 2 additional days to program exercise sessions. Add 2 additional days to program exercise sessions. Add 2 additional days to program exercise sessions. Add 2 additional days to program exercise sessions.   Initial Home Exercises Provided 10/17/20 10/17/20 10/17/20 10/17/20 10/17/20    Row Name 01/08/21 1000             Response to Exercise   Blood Pressure (Admit) 140/70       Blood Pressure (Exercise) 160/64       Blood Pressure (Exit) 140/80       Heart Rate (Admit) 90 bpm       Heart Rate (Exercise) 99 bpm       Heart Rate (Exit) 94 bpm       Oxygen Saturation (Admit) 94 %       Oxygen Saturation (Exercise) 92 %       Oxygen Saturation (Exit) 89 %       Rating of Perceived Exertion (Exercise) 13       Perceived Dyspnea (Exercise) 2       Symptoms SOB       Comments walk test completed  Duration Continue with 30 min of aerobic exercise without signs/symptoms of physical distress.       Intensity THRR unchanged               Progression   Progression Continue to progress workloads to maintain intensity without signs/symptoms of physical distress.       Average METs 2.3               Resistance Training   Training Prescription Yes       Weight 4 lb       Reps 10-15               Interval Training   Interval Training No               Oxygen   Oxygen Continuous       Liters 4                NuStep   Level 4       Minutes 30       METs 2.3               Home Exercise Plan   Plans to continue exercise at Home (comment)  bike       Frequency Add 2 additional days to program exercise sessions.       Initial Home Exercises Provided 10/17/20                Exercise Comments:   Exercise Comments     Row Name 08/14/20 0810 08/21/20 0814 01/30/21 0921       Exercise Comments First full day of exercise!  Patient was oriented to gym and equipment including functions, settings, policies, and procedures.  Patient's individual exercise prescription and treatment plan were reviewed.  All starting workloads were established based on the results of the 6 minute walk test done at initial orientation visit.  The plan for exercise progression was also introduced and progression will be customized based on patient's performance and goals. Forgets to replace oxygen cannula after blowing nose.  Sats drop to low 80's Tery graduated today from  rehab with 36 sessions completed.  Details of the patient's exercise prescription and what He needs to do in order to continue the prescription and progress were discussed with patient.  Patient was given a copy of prescription and goals.  Patient verbalized understanding.  Odie plans to continue to exercise by going to the [facility].              Exercise Goals and Review:   Exercise Goals     Row Name 08/10/20 1000             Exercise Goals   Increase Physical Activity Yes       Intervention Provide advice, education, support and counseling about physical activity/exercise needs.;Develop an individualized exercise prescription for aerobic and resistive training based on initial evaluation findings, risk stratification, comorbidities and participant's personal goals.       Expected Outcomes Short Term: Attend rehab on a regular basis to increase amount of physical activity.;Long Term: Add in home exercise to make exercise  part of routine and to increase amount of physical activity.;Long Term: Exercising regularly at least 3-5 days a week.       Increase Strength and Stamina Yes       Intervention Develop an individualized exercise prescription for aerobic and resistive training based on initial evaluation findings, risk stratification, comorbidities and participant's personal goals.;Provide  advice, education, support and counseling about physical activity/exercise needs.       Expected Outcomes Short Term: Increase workloads from initial exercise prescription for resistance, speed, and METs.;Short Term: Perform resistance training exercises routinely during rehab and add in resistance training at home;Long Term: Improve cardiorespiratory fitness, muscular endurance and strength as measured by increased METs and functional capacity (6MWT)       Able to understand and use rate of perceived exertion (RPE) scale Yes       Intervention Provide education and explanation on how to use RPE scale       Expected Outcomes Short Term: Able to use RPE daily in rehab to express subjective intensity level;Long Term:  Able to use RPE to guide intensity level when exercising independently       Able to understand and use Dyspnea scale Yes       Intervention Provide education and explanation on how to use Dyspnea scale       Expected Outcomes Short Term: Able to use Dyspnea scale daily in rehab to express subjective sense of shortness of breath during exertion;Long Term: Able to use Dyspnea scale to guide intensity level when exercising independently       Knowledge and understanding of Target Heart Rate Range (THRR) Yes       Intervention Provide education and explanation of THRR including how the numbers were predicted and where they are located for reference       Expected Outcomes Short Term: Able to state/look up THRR;Short Term: Able to use daily as guideline for intensity in rehab;Long Term: Able to use THRR to govern intensity when  exercising independently       Able to check pulse independently Yes       Intervention Provide education and demonstration on how to check pulse in carotid and radial arteries.;Review the importance of being able to check your own pulse for safety during independent exercise       Expected Outcomes Short Term: Able to explain why pulse checking is important during independent exercise;Long Term: Able to check pulse independently and accurately       Understanding of Exercise Prescription Yes       Intervention Provide education, explanation, and written materials on patient's individual exercise prescription       Expected Outcomes Short Term: Able to explain program exercise prescription;Long Term: Able to explain home exercise prescription to exercise independently                Exercise Goals Re-Evaluation :  Exercise Goals Re-Evaluation     Row Name 08/14/20 0810 08/21/20 0845 09/04/20 1542 09/26/20 0939 10/02/20 1558     Exercise Goal Re-Evaluation   Exercise Goals Review Able to understand and use rate of perceived exertion (RPE) scale;Knowledge and understanding of Target Heart Rate Range (THRR);Understanding of Exercise Prescription;Able to understand and use Dyspnea scale Increase Physical Activity;Increase Strength and Stamina -- Increase Physical Activity;Increase Strength and Stamina Increase Physical Activity;Increase Strength and Stamina;Understanding of Exercise Prescription   Comments Reviewed RPE and dyspnea scales, THR and program prescription with pt today.  Pt voiced understanding and was given a copy of goals to take home. Baine has had some difficulty with exercise.  His oxygen has dropped to 86%.  Staff reviewed PLB and reminded him to keep cannula in place as it has not been at times during exercise. Out since last review He is back from being sick and now he is ready to get back to  exercise. [name] has attended twice since his return.  He will need to attend  consistently again. We will start to increase workloads and monitor his progress.   Expected Outcomes Short: Use RPE daily to regulate intensity. Long: Follow program prescription in THR. Short: continue to work on PLB Long:  Build overall stamina -- Short: attend [facility] regularly. Long: maintain exercise regularly independently. Short: Begin to increase workloads Long: Continue to improve stamina    Row Name 10/16/20 1106 10/17/20 1029 10/31/20 1418 11/16/20 0755 11/16/20 0922     Exercise Goal Re-Evaluation   Exercise Goals Review Increase Physical Activity;Increase Strength and Stamina Increase Physical Activity;Increase Strength and Stamina Increase Physical Activity;Increase Strength and Stamina;Understanding of Exercise Prescription Increase Physical Activity;Increase Strength and Stamina;Understanding of Exercise Prescription Increase Physical Activity;Increase Strength and Stamina;Understanding of Exercise Prescription   Comments [name] has attended two times last week but missed the week before.  Staff have spoken with him about consistent attendance. Reviewed home exercise with pt today.  Pt plans to use bike at home and join the [employer] for exercise.  Reviewed THR, pulse, RPE, sign and symptoms, pulse oximetery and when to call 911 or MD.  Also discussed weather considerations and indoor options.  Pt voiced understanding. [name] is doing well in rehab. He is up to level 5 on the NuStep.  We will continue to montior his progress. [name] is working in appropriate RPE range at rehab. He continues to rest and use PLB if O2 drops below 88%. He is compliant about watching his saturations. Will continue to monitor [name] is doing well in rehab.  He feels like he is doing well other than his breathing.  He is supposed to start a new med soon.  He is riding his bike for 30 min on his off days and using some weights from [place].   Expected Outcomes Short: attend at least 6 times per month Long:improve stamina  Short: add one day of exercise at home per week Long: improve SOB with ADLS Short: Increase hand weights Long: Conitnue to improve stamina Short: Increase load on T5 Long: Continue to increase overall MET level Short: Continue to add in exercise at home Long: Continue to improve stamina    Row Name 11/29/20 1319 12/05/20 0941 12/12/20 1300 12/25/20 1141 12/26/20 0928     Exercise Goal Re-Evaluation   Exercise Goals Review Increase Physical Activity;Increase Strength and Stamina;Understanding of Exercise Prescription Increase Physical Activity;Increase Strength and Stamina;Understanding of Exercise Prescription Increase Physical Activity;Increase Strength and Stamina Increase Physical Activity;Increase Strength and Stamina Increase Physical Activity;Increase Strength and Stamina   Comments [name] is doing well in rehab.  We moved him up to the XR from the steppers yesterday and he seemed to enjoy it.  He was able to do it the whole time.  We will continue to encourage him to use the treadmill more.  We will continue to monitor his progress. Phil reports working on the stationary bike at home - 30 minutes most days not at rehab; reminded about having warm up and cool down. Maze should be able to increase levels on machines - he has been at same level 4 weeks.  Staff will encourage increasing weights for strength work and continue to try walking. Yorel would benefit from walking at rehab- though he typically stays on 1 machine the whole 30 minutes. He should try to walk as tolerated, keeping O2 saturations above 88%. He did increase to level 3 on the XR. Will continue to monitor.  Stationary bike at home when not at rehab (30 or more minutes) (RPE 11) He has a pulse ox at home, but does not track HR.   Expected Outcomes Short: Continue to attempt treadmill  Long: Continue to improve stamina Short: Continue to attempt treadmill, continue home exercise, include resistance training.  Long: Continue to improve  stamina Short: increase levels on seated machines Long:  improve overall stamina Short: Try walking as tolerated Long: Continue to increase overall MET level ST: stay within THR at home 92-127 LT: continue to increase MET level    Row Name 01/08/21 1025             Exercise Goal Re-Evaluation   Exercise Goals Review Increase Physical Activity;Increase Strength and Stamina       Comments Nayshawn will complete [facility] soon.  He did improve his walk test by 80 feet!       Expected Outcomes Short: complete HT Long: maintain exercise independently                Discharge Exercise Prescription (Final Exercise Prescription Changes):  Exercise Prescription Changes - 01/08/21 1000       Response to Exercise   Blood Pressure (Admit) 140/70    Blood Pressure (Exercise) 160/64    Blood Pressure (Exit) 140/80    Heart Rate (Admit) 90 bpm    Heart Rate (Exercise) 99 bpm    Heart Rate (Exit) 94 bpm    Oxygen Saturation (Admit) 94 %    Oxygen Saturation (Exercise) 92 %    Oxygen Saturation (Exit) 89 %    Rating of Perceived Exertion (Exercise) 13    Perceived Dyspnea (Exercise) 2    Symptoms SOB    Comments walk test completed    Duration Continue with 30 min of aerobic exercise without signs/symptoms of physical distress.    Intensity THRR unchanged      Progression   Progression Continue to progress workloads to maintain intensity without signs/symptoms of physical distress.    Average METs 2.3      Resistance Training   Training Prescription Yes    Weight 4 lb    Reps 10-15      Interval Training   Interval Training No      Oxygen   Oxygen Continuous    Liters 4      NuStep   Level 4    Minutes 30    METs 2.3      Home Exercise Plan   Plans to continue exercise at Home (comment)   bike   Frequency Add 2 additional days to program exercise sessions.    Initial Home Exercises Provided 10/17/20             Nutrition:  Target Goals: Understanding of nutrition  guidelines, daily intake of sodium <1500mg, cholesterol <200mg, calories 30% from fat and 7% or less from saturated fats, daily to have 5 or more servings of fruits and vegetables.  Education: All About Nutrition: -Group instruction provided by verbal, written material, interactive activities, discussions, models, and posters to present general guidelines for heart healthy nutrition including fat, fiber, MyPlate, the role of sodium in heart healthy nutrition, utilization of the nutrition label, and utilization of this knowledge for meal planning. Follow up email sent as well. Written material given at graduation.   Biometrics:  Pre Biometrics - 08/10/20 1001       Pre Biometrics   Height 5' 8.9" (1.75 m)    Weight 228 lb (103.4   kg)    BMI (Calculated) 33.77    Single Leg Stand 5.4 seconds             Post Biometrics - 01/04/21 1131        Post  Biometrics   Height 5' 8" (1.727 m)    Weight 223 lb 3.2 oz (101.2 kg)    BMI (Calculated) 33.95             Nutrition Therapy Plan and Nutrition Goals:  Nutrition Therapy & Goals - 08/16/20 1135       Nutrition Therapy   Diet Heart healthy, low Na    Drug/Food Interactions Statins/Certain Fruits    Protein (specify units) 80g    Fiber 30 grams    Whole Grain Foods 3 servings    Saturated Fats 12 max. grams    Fruits and Vegetables 8 servings/day    Sodium 1.5 grams      Personal Nutrition Goals   Nutrition Goal ST: try diabetes friendly desserts from handout, Add different nonstarchy vegetables to 5 or more meals per week LT:    Comments Breakfast: bacon, egg, and sausage sandwich or scrambled eggs or cornflakes (adds sugar and milk) Lunch: part of sandwich and crackers Dinner: meats with potatoes butter beans or corn, grilled cheese sandwich. Take out on the weekends. Snacks: sweets. Drinks: water and soda (zero) and tea (sweet tea - mcdonalds) and coffee (black). Reports T2DM (lifestyle controlled - not sure what A1C is  and does not check BG at home), high cholesterol (lipitor has been helping). He had open heart surgery in the past. Discussed heart healthy eating and pulmonary MNT. Phil is interested in making changes.      Intervention Plan   Intervention Nutrition handout(s) given to patient.;Prescribe, educate and counsel regarding individualized specific dietary modifications aiming towards targeted core components such as weight, hypertension, lipid management, diabetes, heart failure and other comorbidities.    Expected Outcomes Short Term Goal: Understand basic principles of dietary content, such as calories, fat, sodium, cholesterol and nutrients.;Short Term Goal: A plan has been developed with personal nutrition goals set during dietitian appointment.;Long Term Goal: Adherence to prescribed nutrition plan.             Nutrition Assessments:  MEDIFICTS Score Key: ?70 Need to make dietary changes  40-70 Heart Healthy Diet ? 40 Therapeutic Level Cholesterol Diet  Flowsheet Row Pulmonary Rehab from 08/10/2020 in [facility]  Picture Your Plate Total Score on Admission 55      Picture Your Plate Scores: <40 Unhealthy dietary pattern with much room for improvement. 41-50 Dietary pattern unlikely to meet recommendations for good health and room for improvement. 51-60 More healthful dietary pattern, with some room for improvement.  >60 Healthy dietary pattern, although there may be some specific behaviors that could be improved.   Nutrition Goals Re-Evaluation:  Nutrition Goals Re-Evaluation     Row Name 09/26/20 0936 10/17/20 0944 11/16/20 0932 12/05/20 0939 12/26/20 0927     Goals   Current Weight 226 lb (102.5 kg) -- -- -- 222 lb 14.4 oz (101.1 kg)   Nutrition Goal Lose more weight and make diet changes. -- Reduce sugar and small meals Sort: Consider working on reducing sweets Long: Conitnue to eat healthy. Sort: Consider working on reducing sweets Long: Conitnue to eat  healthy.   Comment He feels like he heats too much bread and he has sweets alot. He is eating ice cream, cookies and other sweets   daily. He is going to try to cute back on the sweets. Raj states he eats pretty good. He has cut back some on sweets.  He will try to have only one sweet treat per day. Phil eats fairly well.  We talked about eating less sweets and gassy foods.  Also review small meals to help with breathing. He continues to try to eat well normally [name] reports that he has no problems with his nutrition at this time. He reports he hasn't reduced his sweets, but is not interested in change at this time. He wants to see the blood work his MD will do. Discussed importance of limiting added sugar and excess carbohydrates with breathing. Phil reports still having no problems with nutrition and is not interested in making any changes at this time, he has not had his bloodwork done yet. Reviewed importance of limiting excess added sugar and excess CHO for breathing.   Expected Outcome Short: cut back on sweets. Long: limit sweets to a minimum. Short: continue to owrk on reducing sweets Long: one sweet treat per day Sort: Work on reducing sweets Long: Conitnue to eat healthy. Sort: Consider working on reducing sweets Long: Conitnue to eat healthy. Sort: Consider working on reducing sweets Long: Conitnue to eat healthy.            Nutrition Goals Discharge (Final Nutrition Goals Re-Evaluation):  Nutrition Goals Re-Evaluation - 12/26/20 0927       Goals   Current Weight 222 lb 14.4 oz (101.1 kg)    Nutrition Goal Sort: Consider working on reducing sweets Long: Conitnue to eat healthy.    Comment Phil reports still having no problems with nutrition and is not interested in making any changes at this time, he has not had his bloodwork done yet. Reviewed importance of limiting excess added sugar and excess CHO for breathing.    Expected Outcome Sort: Consider working on reducing sweets Long:  Conitnue to eat healthy.             Psychosocial: Target Goals: Acknowledge presence or absence of significant depression and/or stress, maximize coping skills, provide positive support system. Participant is able to verbalize types and ability to use techniques and skills needed for reducing stress and depression.   Education: Stress, Anxiety, and Depression - Group verbal and visual presentation to define topics covered.  Reviews how body is impacted by stress, anxiety, and depression.  Also discusses healthy ways to reduce stress and to treat/manage anxiety and depression.  Written material given at graduation.   Education: Sleep Hygiene -Provides group verbal and written instruction about how sleep can affect your health.  Define sleep hygiene, discuss sleep cycles and impact of sleep habits. Review good sleep hygiene tips.    Initial Review & Psychosocial Screening:  Initial Psych Review & Screening - 08/04/20 1508       Initial Review   Current issues with Current Stress Concerns;Current Sleep Concerns    Source of Stress Concerns Unable to participate in former interests or hobbies;Unable to perform yard/household activities;Chronic Illness      Family Dynamics   Good Support System? Yes   God, wife, medical team     Barriers   Psychosocial barriers to participate in program There are no identifiable barriers or psychosocial needs.      Screening Interventions   Interventions Provide feedback about the scores to participant;Encouraged to exercise;To provide support and resources with identified psychosocial needs    Expected Outcomes Short Term goal: Utilizing  psychosocial counselor, staff and physician to assist with identification of specific Stressors or current issues interfering with healing process. Setting desired goal for each stressor or current issue identified.;Long Term Goal: Stressors or current issues are controlled or eliminated.;Short Term goal:  Identification and review with participant of any Quality of Life or Depression concerns found by scoring the questionnaire.;Long Term goal: The participant improves quality of Life and PHQ9 Scores as seen by post scores and/or verbalization of changes             Quality of Life Scores:  Scores of 19 and below usually indicate a poorer quality of life in these areas.  A difference of  2-3 points is a clinically meaningful difference.  A difference of 2-3 points in the total score of the Quality of Life Index has been associated with significant improvement in overall quality of life, self-image, physical symptoms, and general health in studies assessing change in quality of life.  PHQ-9: Recent Review Flowsheet Data     Depression screen PHQ 2/9 01/30/2021 08/10/2020   Decreased Interest 1 0   Down, Depressed, Hopeless 1 0   PHQ - 2 Score 2 0   Altered sleeping 1 1   Tired, decreased energy 1 1   Change in appetite 2 0   Feeling bad or failure about yourself  2 0   Trouble concentrating 0 0   Moving slowly or fidgety/restless 0 0   Suicidal thoughts 0 0   PHQ-9 Score 8 2   Difficult doing work/chores Somewhat difficult Not difficult at all      Interpretation of Total Score  Total Score Depression Severity:  1-4 = Minimal depression, 5-9 = Mild depression, 10-14 = Moderate depression, 15-19 = Moderately severe depression, 20-27 = Severe depression   Psychosocial Evaluation and Intervention:  Psychosocial Evaluation - 08/04/20 1517       Psychosocial Evaluation & Interventions   Interventions Encouraged to exercise with the program and follow exercise prescription;Stress management education;Relaxation education    Comments Mr. Bolding has had a complicated medical history this last year and a half. He was diagnosed with colon CA last year and then randomnly started experiencing severe shortness of breaht this last fall. 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 He used  to spend a lot of his time outdoors doing different activities, but now can't due to the oxygen tanks and shortness of breath. His sleep pattern has been unusual for the last few years with no recent change, where he sleeps only a little some nights and then up the rest of the night. He states his main support system is God and his wife, he also is very appreciative of his doctors. He received news recently that he was cancer free so now he is focused on his breathing issues. His main goal is to feel better and hopefully be able to wear a concentrator so he can do more of his hobbies outside.    Expected Outcomes Short; attend pulmonary rehab for education and exercise. Long: develop positive self care habits.    Continue Psychosocial Services  Follow up required by staff             Psychosocial Re-Evaluation:  Psychosocial Re-Evaluation     Row Name 09/26/20 0944 10/17/20 0942 11/16/20 0933 12/05/20 0941 12/26/20 0937     Psychosocial Re-Evaluation   Current issues with Current Stress Concerns;Current Sleep Concerns Current Stress Concerns;Current Sleep Concerns Current Stress Concerns;Current Sleep Concerns Current   Stress Concerns Current Stress Concerns   Comments His shortness of breath and his age are starting to get to him. He is not sure how much longer he has left. He states that he has never been able to sleep good. He has regrets of not being compassionate with his father and his lung disease. Fraser is feeling much better than he was 3 weeks ago since he has got over bronchitis.  He is determined to get better.  He still does not sleep well.  He states hes always been a light sleeper. [name] is doing well, still frustrated with his breathing.  Overall, he is doing well mentally.  He still does not sleep well but has not talked to doctor about his sleep.  We also mentioned getting tools to help with breathing training at home. [name] reports doing better with his stress as he starts to get  better and make progress. He felt stressed when he was active and then had to stop due to breathing. He reports sleeping well most of the time. To help relax he likes to watch TV. His wife is a good support system for him. [name] reports finding his health conditions stressful and would like to move to a smaller tank so that he is more mobile and he could be more active. He reports his wife is a good support system for him. He doesn't have anything that he does to relax and he tries to stay positive and pray. He reports sleeping well.   Expected Outcomes Short: Continue to exercise regularly to support mental health and notify staff of any changes. Long: maintain mental health and well being through teaching of rehab or prescribed medications independently. Short: continue to attend LW Long: attend education to help manage stress Short: Talk to doctor about breathing and sleep. Long: Continue to focus on positive. Short: continue to exercise to help improve breathing Long: Continue to focus on positive. Short: continue to exercise to help improve breathing Long: Continue to focus on positive.   Interventions Encouraged to attend Pulmonary Rehabilitation for the exercise -- Encouraged to attend Pulmonary Rehabilitation for the exercise Encouraged to attend Pulmonary Rehabilitation for the exercise Encouraged to attend Pulmonary Rehabilitation for the exercise   Continue Psychosocial Services  Follow up required by staff -- Follow up required by staff Follow up required by staff Follow up required by staff     Initial Review   Source of Stress Concerns Chronic Illness -- -- Chronic Illness;Unable to participate in former interests or hobbies;Unable to perform yard/household activities Chronic Illness;Unable to participate in former interests or hobbies;Unable to perform yard/household activities            Psychosocial Discharge (Final Psychosocial Re-Evaluation):  Psychosocial Re-Evaluation - 12/26/20  0937       Psychosocial Re-Evaluation   Current issues with Current Stress Concerns    Comments [name] reports finding his health conditions stressful and would like to move to a smaller tank so that he is more mobile and he could be more active. He reports his wife is a good support system for him. He doesn't have anything that he does to relax and he tries to stay positive and pray. He reports sleeping well.    Expected Outcomes Short: continue to exercise to help improve breathing Long: Continue to focus on positive.    Interventions Encouraged to attend Pulmonary Rehabilitation for the exercise    Continue Psychosocial Services  Follow up required by staff  Initial Review   Source of Stress Concerns Chronic Illness;Unable to participate in former interests or hobbies;Unable to perform yard/household activities             Education: Education Goals: Education classes will be provided on a weekly basis, covering required topics. Participant will state understanding/return demonstration of topics presented.  Learning Barriers/Preferences:  Learning Barriers/Preferences - 08/04/20 1508       Learning Barriers/Preferences   Learning Barriers None    Learning Preferences None             General Pulmonary Education Topics:  Infection Prevention: - Provides verbal and written material to individual with discussion of infection control including proper hand washing and proper equipment cleaning during exercise session. Flowsheet Row Pulmonary Rehab from 10/12/2020 in [facility] Cardiac and Pulmonary Rehab  Date 08/10/20  Educator [facility]  Instruction Review Code 1- Verbalizes Understanding       Falls Prevention: - Provides verbal and written material to individual with discussion of falls prevention and safety. Flowsheet Row Pulmonary Rehab from 10/12/2020 in [facility] Cardiac and Pulmonary Rehab  Date 08/10/20  Educator [facility]  Instruction Review Code 1- Verbalizes Understanding        Chronic Lung Disease Review: - Group verbal instruction with posters, models, PowerPoint presentations and videos,  to review new updates, new respiratory medications, new advancements in procedures and treatments. Providing information on websites and "800" numbers for continued self-education. Includes information about supplement oxygen, available portable oxygen systems, continuous and intermittent flow rates, oxygen safety, concentrators, and Medicare reimbursement for oxygen. Explanation of Pulmonary Drugs, including class, frequency, complications, importance of spacers, rinsing mouth after steroid MDI's, and proper cleaning methods for nebulizers. Review of basic lung anatomy and physiology related to function, structure, and complications of lung disease. Review of risk factors. Discussion about methods for diagnosing sleep apnea and types of masks and machines for OSA. Includes a review of the use of types of environmental controls: home humidity, furnaces, filters, dust mite/pet prevention, HEPA vacuums. Discussion about weather changes, air quality and the benefits of nasal washing. Instruction on Warning signs, infection symptoms, calling MD promptly, preventive modes, and value of vaccinations. Review of effective airway clearance, coughing and/or vibration techniques. Emphasizing that all should Create an Action Plan. Written material given at graduation. Flowsheet Row Pulmonary Rehab from 10/12/2020 in [facility] Cardiac and Pulmonary Rehab  Education need identified 08/10/20  Date 10/12/20  Educator [facility]  Instruction Review Code 1- Verbalizes Understanding       AED/CPR: - Group verbal and written instruction with the use of models to demonstrate the basic use of the AED with the basic ABC's of resuscitation.    Anatomy and Cardiac Procedures: - Group verbal and visual presentation and models provide information about basic cardiac anatomy and function. Reviews the testing methods done  to diagnose heart disease and the outcomes of the test results. Describes the treatment choices: Medical Management, Angioplasty, or Coronary Bypass Surgery for treating various heart conditions including Myocardial Infarction, Angina, Valve Disease, and Cardiac Arrhythmias.  Written material given at graduation.   Medication Safety: - Group verbal and visual instruction to review commonly prescribed medications for heart and lung disease. Reviews the medication, class of the drug, and side effects. Includes the steps to properly store meds and maintain the prescription regimen.  Written material given at graduation.   Other: -Provides group and verbal instruction on various topics (see comments)   Knowledge Questionnaire Score:  Knowledge Questionnaire Score - 01/30/21 1007  Knowledge Questionnaire Score   Pre Score 13/15 Education Focus: O2 safety, exercise    Post Score 13/18              Core Components/Risk Factors/Patient Goals at Admission:  Personal Goals and Risk Factors at Admission - 08/10/20 1002       Core Components/Risk Factors/Patient Goals on Admission    Weight Management Yes;Obesity;Weight Loss    Intervention Weight Management: Develop a combined nutrition and exercise program designed to reach desired caloric intake, while maintaining appropriate intake of nutrient and fiber, sodium and fats, and appropriate energy expenditure required for the weight goal.;Weight Management: Provide education and appropriate resources to help participant work on and attain dietary goals.;Weight Management/Obesity: Establish reasonable short term and long term weight goals.;Obesity: Provide education and appropriate resources to help participant work on and attain dietary goals.    Admit Weight 228 lb (103.4 kg)    Goal Weight: Short Term 220 lb (99.8 kg)    Goal Weight: Long Term 215 lb (97.5 kg)    Expected Outcomes Short Term: Continue to assess and modify interventions  until short term weight is achieved;Long Term: Adherence to nutrition and physical activity/exercise program aimed toward attainment of established weight goal;Weight Loss: Understanding of general recommendations for a balanced deficit meal plan, which promotes 1-2 lb weight loss per week and includes a negative energy balance of [id number] kcal/d;Understanding recommendations for meals to include 15-35% energy as protein, 25-35% energy from fat, 35-60% energy from carbohydrates, less than 230m of dietary cholesterol, 20-35 gm of total fiber daily;Understanding of distribution of calorie intake throughout the day with the consumption of 4-5 meals/snacks    Improve shortness of breath with ADL's Yes    Intervention Provide education, individualized exercise plan and daily activity instruction to help decrease symptoms of SOB with activities of daily living.    Expected Outcomes Short Term: Improve cardiorespiratory fitness to achieve a reduction of symptoms when performing ADLs;Long Term: Be able to perform more ADLs without symptoms or delay the onset of symptoms    Hypertension Yes    Intervention Provide education on lifestyle modifcations including regular physical activity/exercise, weight management, moderate sodium restriction and increased consumption of fresh fruit, vegetables, and low fat dairy, alcohol moderation, and smoking cessation.;Monitor prescription use compliance.    Expected Outcomes Short Term: Continued assessment and intervention until BP is < 140/928mHG in hypertensive participants. < 130/8013mG in hypertensive participants with diabetes, heart failure or chronic kidney disease.;Long Term: Maintenance of blood pressure at goal levels.    Lipids Yes    Intervention Provide education and support for participant on nutrition & aerobic/resistive exercise along with prescribed medications to achieve LDL <13m23mDL >40mg51m Expected Outcomes Short Term: Participant states understanding  of desired cholesterol values and is compliant with medications prescribed. Participant is following exercise prescription and nutrition guidelines.;Long Term: Cholesterol controlled with medications as prescribed, with individualized exercise RX and with personalized nutrition plan. Value goals: LDL < 13mg,39m > 40 mg.             Education:Diabetes - Individual verbal and written instruction to review signs/symptoms of diabetes, desired ranges of glucose level fasting, after meals and with exercise. Acknowledge that pre and post exercise glucose checks will be done for 3 sessions at entry of program.   Know Your Numbers and Heart Failure: - Group verbal and visual instruction to discuss disease risk factors for cardiac and pulmonary disease and treatment options.  Reviews associated critical values for Overweight/Obesity, Hypertension, Cholesterol, and Diabetes.  Discusses basics of heart failure: signs/symptoms and treatments.  Introduces Heart Failure Zone chart for action plan for heart failure.  Written material given at graduation.   Core Components/Risk Factors/Patient Goals Review:   Goals and Risk Factor Review     Row Name 09/26/20 0935 10/17/20 0939 11/16/20 0930 12/05/20 0934 12/26/20 0934     Core Components/Risk Factors/Patient Goals Review   Personal Goals Review Improve shortness of breath with ADL's Improve shortness of breath with ADL's Improve shortness of breath with ADL's;Weight Management/Obesity;Hypertension Improve shortness of breath with ADL's;Weight Management/Obesity;Hypertension Improve shortness of breath with ADL's;Weight Management/Obesity;Hypertension   Review Spoke to patient about their shortness of breath and what they can do to improve. Patient has been informed of breathing techniques when starting the program. Patient is informed to tell staff if they have had any med changes and that certain meds they are taking or not taking can be causing shortness  of breath. Marl had a heart cath last week and his heart is great.  He sees pulmonologist Friday who has also emphasized importance of exercise.  He feels like he can do more than he could when he started.  He would  like to get down to 2L of oxygen.  We reviewed importance of consistent attendance. [name] is down to 220 lb and holding steady.  His doctor would like for him to get down to 215 lb.  He checks his pressures 2x a day and usually its pretty good, highest has been 144.  His breathing is still his biggest limitation but he has not been using PLB so we talked more about it today. He reports tyvaso (5 breaths per day)  has been helping him breathe better. He feels he can breathe longer without giving out. Still having shortness of breath when reaching up overhead. He continues to practice his PLB which he feels helped, but not as much as the medication he started. He is wathcing out for side effects from medication- none so far. He checks his BP at home, 124-130/70-80; today 118/66. Taking all medication as prescribed. He sees his family MD friday. He reports his weight has been stable He checks his BP at home : usually about 128/75 (2x/day). Today BP in rehab was 140/70. He continues to take his medications as prescibed, but his doctor would like to change his pulmonary medicaiton. His weight continues to be stable. He feels like his breathing is getting a bit better, but he feels like the inhaler he takes is helping.   Expected Outcomes Short: Attend [facility] regularly to improve shortness of breath with ADL's. Long: maintain independence with ADL's Short: exercise at least 3 times a week Long: be able to reduce to 2L oxygen Short: Work on PLB Long: COnitnue to work on [employer] loss Short: Work on PLB Long: Continue to work on [employer] loss Short: continue to work on PLB, try out new medicaiton from MD Long: Continue to work on [employer] loss            Core Components/Risk Factors/Patient Goals at  Discharge (Final Review):   Goals and Risk Factor Review - 12/26/20 0934       Core Components/Risk Factors/Patient Goals Review   Personal Goals Review Improve shortness of breath with ADL's;Weight Management/Obesity;Hypertension    Review He checks his BP at home : usually about 128/75 (2x/day). Today BP in rehab was 140/70. He continues to take his medications as  prescibed, but his doctor would like to change his pulmonary medicaiton. His weight continues to be stable. He feels like his breathing is getting a bit better, but he feels like the inhaler he takes is helping.    Expected Outcomes Short: continue to work on PLB, try out new medicaiton from MD Long: Continue to work on weight loss             ITP Comments:  ITP Comments     Row Name 08/04/20 1504 08/10/20 0950 08/14/20 0810 08/16/20 1141 08/30/20 0949   ITP Comments Initial telephone orientation completed. Diagnosis can be found in [facility] 3/23. EP orientation scheduled for Thursday 3/31 at 8am. Completed 6MWT and gym orientation. Initial ITP created and sent for review to Dr. Emily Filbert, Medical Director. First full day of exercise!  Patient was oriented to gym and equipment including functions, settings, policies, and procedures.  Patient's individual exercise prescription and treatment plan were reviewed.  All starting workloads were established based on the results of the 6 minute walk test done at initial orientation visit.  The plan for exercise progression was also introduced and progression will be customized based on patient's performance and goals. Completed initial RD evaluation 30 Day review completed. Medical Director ITP review done, changes made as directed, and signed approval by Medical Director.    [place] Name 09/04/20 1542 09/27/20 0650 10/25/20 0730 11/22/20 0650 12/20/20 0714   ITP Comments Called to check on patient. He was out sick last week with bronchitis.  His doctor cleared him to return once he feels up to it.   He is starting to feel better now.  He hopes to return on Friday 4/29. 30 Day review completed. Medical Director ITP review done, changes made as directed, and signed approval by Medical Director.  1 visit in May 30 Day review completed. Medical Director ITP review done, changes made as directed, and signed approval by Medical Director. 30 Day review completed. Medical Director ITP review done, changes made as directed, and signed approval by Medical Director. 30 Day review completed. Medical Director ITP review done, changes made as directed, and signed approval by Medical Director.    [place] Name 01/17/21 0621 01/30/21 0920         ITP Comments 30 Day review completed. Medical Director ITP review done, changes made as directed, and signed approval by Medical Director. Ziah graduated today from  rehab with 36 sessions completed.  Details of the patient's exercise prescription and what He needs to do in order to continue the prescription and progress were discussed with patient.  Patient was given a copy of prescription and goals.  Patient verbalized understanding.  Ronit plans to continue to exercise by going to the [facility].               Comments: discharge ITP

## 2021-01-30 NOTE — Progress Notes (Signed)
Daily Session Note  Patient Details  Name: Garrett Peterson MRN: [id number] Date of Birth: June 29, 1945 Referring Provider:   Flowsheet Row Pulmonary Rehab from 08/10/2020 in [facility] Cardiac and Pulmonary Rehab  Referring Provider Brand Males MD       Encounter Date: 01/30/2021  Check In:  Session Check In - 01/30/21 0919       Check-In   Supervising physician immediately available to respond to emergencies See telemetry face sheet for immediately available ER MD    Location [facility]-Cardiac & Pulmonary Rehab    Staff Present Birdie Sons, MPA, RN;Jessica Luan Pulling, MA, RCEP, CCRP, CCET;Amanda Sommer, BA, ACSM CEP, Exercise Physiologist    Virtual Visit No    Medication changes reported     No    Fall or balance concerns reported    No    Warm-up and Cool-down Performed on first and last piece of equipment    Resistance Training Performed Yes    VAD Patient? No    PAD/SET Patient? No      Pain Assessment   Currently in Pain? No/denies                Social History   Tobacco Use  Smoking Status Never  Smokeless Tobacco Former   Types: Chew    Goals Met:  Independence with exercise equipment Exercise tolerated well No report of concerns or symptoms today Strength training completed today  Goals Unmet:  Not Applicable  Comments:  Shin graduated today from  rehab with 36 sessions completed.  Details of the patient's exercise prescription and what He needs to do in order to continue the prescription and progress were discussed with patient.  Patient was given a copy of prescription and goals.  Patient verbalized understanding.  Dixie plans to continue to exercise by going to the [facility].     Dr. Emily Filbert is Medical Director for [place].  Dr. Ottie Glazier is Medical Director for [facility] Pulmonary Rehabilitation.

## 2021-02-02 ENCOUNTER — Other Ambulatory Visit: Payer: Self-pay

## 2021-02-02 ENCOUNTER — Ambulatory Visit (INDEPENDENT_AMBULATORY_CARE_PROVIDER_SITE_OTHER): Payer: Medicare Other | Admitting: Internal Medicine

## 2021-02-02 ENCOUNTER — Encounter: Payer: Self-pay | Admitting: Internal Medicine

## 2021-02-02 VITALS — BP 120/68 | HR 64 | Temp 98.1°F | Ht 68.0 in | Wt 226.6 lb

## 2021-02-02 DIAGNOSIS — F32A Depression, unspecified: Secondary | ICD-10-CM

## 2021-02-02 DIAGNOSIS — I2723 Pulmonary hypertension due to lung diseases and hypoxia: Secondary | ICD-10-CM | POA: Diagnosis not present

## 2021-02-02 DIAGNOSIS — Z5181 Encounter for therapeutic drug level monitoring: Secondary | ICD-10-CM

## 2021-02-02 DIAGNOSIS — J9611 Chronic respiratory failure with hypoxia: Secondary | ICD-10-CM | POA: Diagnosis not present

## 2021-02-02 DIAGNOSIS — F419 Anxiety disorder, unspecified: Secondary | ICD-10-CM

## 2021-02-02 DIAGNOSIS — Z79899 Other long term (current) drug therapy: Secondary | ICD-10-CM

## 2021-02-02 DIAGNOSIS — J84112 Idiopathic pulmonary fibrosis: Secondary | ICD-10-CM | POA: Diagnosis not present

## 2021-02-02 LAB — HEPATIC FUNCTION PANEL
ALT: 31 U/L (ref 0–53)
AST: 24 U/L (ref 0–37)
Albumin: 4 g/dL (ref 3.5–5.2)
Alkaline Phosphatase: 122 U/L — ABNORMAL HIGH (ref 39–117)
Bilirubin, Direct: 0.1 mg/dL (ref 0.0–0.3)
Total Bilirubin: 0.4 mg/dL (ref 0.2–1.2)
Total Protein: 7.3 g/dL (ref 6.0–8.3)

## 2021-02-02 NOTE — Addendum Note (Signed)
Addended by: Suzzanne Cloud E on: 02/02/2021 10:53 AM   Modules accepted: Orders

## 2021-02-02 NOTE — Addendum Note (Signed)
Addended by: Lorretta Harp on: 02/02/2021 10:53 AM   Modules accepted: Orders

## 2021-02-02 NOTE — Progress Notes (Signed)
Previous [facility] pulmonary encounter: 07/04/20- Dr. Chase Caller  Referred to the ILD center for comprehensive evaluation by Dr. Rodman Pickle primary pulmonologist 75 year old male, never smoked.  Significant for chronic hypoxic respiratory failure, coronary artery disease, hypertension.  Patient of Dr. Chase Caller. Started OFEV in April 2022.   Garrett Peterson 75 y.o. -very complicated story.  Details of the story is copied and pasted above.  Meeting him for the first time with his wife.  It appears that he was previously healthy as of April 2021.  Although CT scan of the chest at the time of my personal visualization showed presence of early ILD [CT abdomen 10 years prior in 2011 had no ILD].  He was diagnosed with anorectal cancer.  He was undergoing treatment at [facility].  Chemotherapy as above.  Then in the fall/winter 2020 when he started falling ill with respiratory illnesses.  This is all documented above.  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  CT scan of the chest by October 2021 started showing significant amount of groundglass opacities.  He only had ANA test, rheumatoid factor and angiotensin-converting enzyme.  This is all negative.  For serology.  Otherwise has not had any other serology.  He had hypersensitive pneumonitis panel this was negative.  This was at outside facility [facility] clinic pulmonology program.  He has had 2 hospitalizations the most recent one being in January.  It appears that he was treated with prednisone each time.  It is unclear to me as this to him and his wife if he has been on chronic prednisone but reading the notes it appears so.  Currently is just finished a taper starting at 60 mg/day in January and finished at 20 mg and stop.  He says since stopping yesterday or today he is beginning to feel a little bit worse.  Room air oxygen at rest was fine but when he desaturated after he walked more than 2 laps.  However he needed 5 L to correct.  Most recent  echocardiogram was normal.  Most recent lavage showed mostly neutrophils.  Again culture negative.  His most recent CT scan of the chest in February 2022 without contrast shows improvement in groundglass opacities but no fibrosis.  His serologies ANA negative and rheumatoid factor negative and angiotensin converting enzyme negative  Definitely earlier on in the year it was not consistent with UIP and suggested alternate diagnosis.  Unclear to me if he has probable UIP pattern at this point in time in February 2022.  We did not have time to do an extensive detailed ILD questionnaire.  It appears that his goals are to get better and also feel better at the same time control his cancer.  They are also wondering about prognosis.  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  He is currently looking into getting radiation locally.  Oncologist wants to do a colonoscopy.  She feels the recurrence is because all his chemo has been on hold because of respiratory issues.   SErology Dec 2021 - [name]  ANA Direct - [facility] Negative Negative   RA Latex Turbid. - [facility] 0.0 - 13.9 IU/mL <10.0   A.Fumigatus #1 Abs - [facility] Negative Negative  Micropolyspora faeni, IgG - [facility] Negative Negative  Thermoactinomyces vulgaris, IgG - [facility] Negative Negative  A. Pullulans Abs -  [facility] Negative Negative  Thermoact. Saccharii - [facility] Negative Negative  Pigeon Serum Abs - [facility] Negative Negative   Angio Convert Enzyme - [facility] 14 - 82 U/L 26   Walk tst 07/04/2020    94% - ra at rest -> walked 2 laps without desat and dropped to 86% in middle of 3rd lap. Then need 5L [place] to correct to walk 3 laps in office   CT chest 06/23/20   IMPRESSION: 1. 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 Fibrosis has progressed since 03/02/2020 chest CT with resolved consolidative opacities. 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 UIP is difficult to exclude but is less favored. Follow-up high-resolution chest CT suggested in 6-12 months. Findings are indeterminate for UIP per consensus guidelines: Diagnosis of Idiopathic Pulmonary Fibrosis: An Official ATS/ERS/JRS/ALAT Clinical Practice Guideline. [place], Iss 5, [phone], Jan 11 2017. 2. Stable mild cardiomegaly. 3. Aortic Atherosclerosis (ICD10-I70.0).     Electronically Signed   By: [name] M.D.   On: 06/24/2020 15:15      08/02/2020 Patient presents today for 1 month follow-up with spirometry/DLCO. Patient was discussed at ILD conference in March 2022, diagnosed with IPF. Recent exacerbation likely chemo related. Started on Ofev. Patient was referred for right heart cath and referred to pulmonary rehab.  He is currently on an extended prednisone taper. He started 109m prednisone dose today x 2 weeks. He is still taking Bactrim. Breathing has improved. He has not started antifibrotic medication yet, his wifes states that their insurance has improved OFEV medication.   PFT 08/02/2020 - FVC 1.61 (41%), FEV1 1.55 (55%), ratio 96, DLCOcor 12.27 (52%) 09/21/2020 Patient presents today for 1 month follow-up. Accompanied by his son. He stop pulmonary for a week or two since having bronchitis. Cough is better since taking mucinex. He is on 4L nasal cannula. He is having 6-7 loose stools a day. Diarrhea started when he went up to 2 tabs of Ofev. He has up coming hearth cath, date to be determined. Son reports some new leg swelling. Due for LFTs today.   HEart cth  10/03/20  Conclusions: Severe native coronary artery disease including 70% distal LMCA and mid LAD disease, sequential 100% ostial and 99% proximal LCx lesions, and chronic total occlusion of ostial RCA. Widely patent LIMA-LAD and SVG-rPDA. Patent SVG-OM2 with 40% stenosis in proximal/mid portion of SVG. Mildly elevated left heart filling pressure (LVEDP 20 mmHg, PCWP 23 mmHg). Moderately elevated right heart filling pressure (mean RAP 14 mmHg, LVEDP 20 mmHg). Moderate pulmonary hypertension (mean PAP 35 mmHg, PVR 2.6 WU). Mildly reduced cardiac output/index.   Recommendations: Aggressive secondary prevention of coronary artery disease. Initiate furosemide 40 mg PO daily for HFpEF.  BMP to be drawn when patient is seen for follow-up 10/13/2020. Ongoing management of pulmonary fibrosis and pulmonary hypertension per Dr. RChase Caller   CNelva Bush MD [facility]      OV 10/18/2020  Subjective:  Patient ID: PJuliette Peterson male , DOB: 128-Apr-1947, age 75y.o. , MRN: [id number], [place] [id number] TAlbina Billet MD Patient Care Team: TAlbina Billet MD as PCP - General (Internal Medicine) End, CHarrell Gave MD as PCP - Cardiology (Cardiology) SClent Jacks RN as  Oncology Nurse Navigator Earlie Server, MD as Consulting Physician (Oncology)  This Provider for this visit: Treatment Team:  Attending Provider: Brand Males, MD  Type of visit: Telephone/Video Circumstance: COVID-19 national emergency Identification of patient Garrett Peterson with [birth year] and MRN [id number] - 2 person identifier Risks: Risks, benefits, limitations of telephone visit explained. Patient understood and verbalized agreement to proceed Anyone else on call: just patient Patient location: [phone] This provider location: Provider home because provider is isolating due to covid   10/18/2020 -  followup IPF.   HPI ANSELMO Peterson 75 y.o. - Off ofev since  09/21/20 due to diarrrhea. He called 09/28/20 and we told him to stop ofev.  But now he tells me 10/18/2020 that for 3 weeks he is taking 150m bid. No diarrhea other than going to BR frequently but is tolerabl and small amounts only. Respiration wise he is stable. Starts getting worse after lunch. Still on 4 LNC o2. No change Not on active cancer Rx. Has been cleared. Had RHC- see ablve. Srated on new lasix and helped breathing somewhat. Is attending pulm rehab  Discussed Include in patients with ILD   - Right heart cath :  PVR > 3, PCWP </= 15, Pmap >/=  25 -  Patient needed to be able to walk 1023m 300 feet on a 43m84mlk test  - he does have exclusionary criteria - his PVR is < 3 and PCWP is  15 but he is interested in applying for tyvasos. Discussed side effect profile briefly       OV 12/19/2020  Subjective:  Patient ID: Garrett Peterson , DOB: [birth year]age [age]o. , MRN: [id number]: [id number] TatAlbina BilletD Patient Care Team: TatAlbina BilletD as PCP - General (Internal Medicine) End, ChrHarrell GaveD as PCP - Cardiology (Cardiology) StaClent JacksN as Oncology Nurse Navigator Yu,Earlie ServerD as Consulting Physician (Oncology)  This Provider for this visit: Treatment Team:  Attending Provider: RamBrand MalesD    12/19/2020 -   Chief Complaint  Patient presents with   Follow-up    PFT performed today.  Pt states that he has had a lot of postnasal drainage in the mornings. Pt states his breathing is about the same since last visit. Pt is on Tyvaso and states that has been working well.   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 -presents for follow-up with his family member.  Overall he stable.  Symptom scores are stable.  He had pulmonary function test.?  Quality of respiratory maneuver but his FVC shows 4% decline compared to 6 months ago of 5 months ago.  DLCO is stable.  He himself feels stable.  He is on inhaled treprostinil.  He feels inhaled treprostinil since early July 2022 is improving his shortness of breath.  He is using it 4 times daily and each time 7 times.  Plan is to escalate to close to 12 times each time.  He is not doing nintedanib.  He does not want to do nintedanib.  He rechallenged himself again for the second time and had side effects.  He feels is  because his GI motility is different after his anorectal cancer.  He wants to know if he can try any natural treatment.  He is particularly interested in pine pollen.  I told him that I do not know much about this medication but would be open to him trying it.  He also wanted to know what to do about sinus drainage which is the early in the morning for a few hours and is having to sneeze a lot.  His family member asked if he should turn down the humidifier which I said was okay.  Can also try saline nasal spray to clear it.  They wanted know about oxygen use.  He is using 4 L.  I advised him to use it continuously.  Also advised to adjusted for pulse ox goal greater than 88% with exertion.     OV 02/02/2021  Subjective:  Patient ID: Garrett Peterson, male , DOB: 12/29/1945 , age 24 y.o. , MRN: [id number] , ADDRESS: [place] [id number] PCP Albina Billet, MD Patient Care Team: Albina Billet, MD as PCP - General (Internal Medicine) End, Harrell Gave, MD as PCP - Cardiology (Cardiology) Clent Jacks, RN as Oncology Nurse Navigator Earlie Server, MD as Consulting Physician (Oncology)  This Provider for this visit: Treatment Team:  Attending Provider: Brand Males, MD   Follow-up IPF [exacerbated by radiation therapy  for anal rectal cancer )  -Intolerant to nintedanib spring 2022  -Diagnosis on multidisciplinary case conference 2022  - start esbriet Aug 2022 WHO group 3 pulmonary hypertension -started Tyvaso early November 11, 2020 Chronic hypoxemic respiratory failure  -4 L due to the above   02/02/2021 -   Chief Complaint  Patient presents with   Follow-up    Pt states he is about the same since last visit. Still becomes SOB with exertion.     HPI Garrett Peterson 75 y.o. -presents for follow-up.  He presents with [name] his grandson.  He has completed pulmonary rehabilitation.  They are reporting still 4 L of oxygen use at rest and 5 L with exertion.  He is now on pirfenidone.  He tried to escalate to 3 pills 3 times daily but this caused GI issues and low appetite.  So he is just taking 2 pills 3 times daily.  His grandson states that at pulmonary rehab and 3 L he did desaturate to 83%.  At home on 4 L he will desaturate into the 80s.  But at 5 L he seems okay.  They want a portable oxygen system.  He says that the restricted lifestyle from pulmonary fibrosis is making him more anxious and depressed.  In fact his anxiety and depression scores have gone up.  He feels portable oxygen will help him.  He wants portable oxygen.  I offered mental health counseling but he wants to try portable oxygen first.  He is willing to talk to the support group leader.  He has questions about life expectancy which I told him and the natural history would be few to several years.  The hope is that these antifibrotic's and keep him stable to new medications, long.  Also expressed uncertainty with the course.  He processed all this.  He is frustrated by his disease.  He again had questions about the goal of the inhaled treprostinil which I explained.  At this point in time he is accepted using the oxygen but wants portable.  He will use inhaled  treprostinil and also the pirfenidone.  He will check liver function test today.  Were  not sure of the rehabilitation helped him.  His main purpose right now is to be able to get out of the house and have improvements in his anxiety and depression.  We discussed clinical trials is not eligible because of his cancer history.         SYMPTOM SCALE - ILD 07/04/2020  08/02/2020  12/19/2020 Tyvaso since early July 2022 Off ofev 02/02/2021 Tyvasos Esbiret 2 pills tid  O2 use 4L  4L 4L 4L rest, 5L ex  Shortness of Breath 0 -> 5 scale with 5 being worst (score 6 If unable to do)     At rest 1 0 5 2.5  Simple tasks - showers, clothes change, eating, shaving 4 3 3.5 4  Household (dishes, doing bed, laundry) x x x x  Shopping x x x x  Walking level at own pace 3 3 x x  Walking up Stairs _0 Total (30-36) Dyspnea Score 12 10 13.5 11.5  How bad is your cough? 0 0 2   How bad is your fatigue _1 How bad is nausea 0 0 0 0  How bad is vomiting?  0 0 0 0  How bad is diarrhea? 0 0 1 0  How bad is anxiety? _2 How bad is depression 5 3 2.5 4        PFT  PFT Results Latest Ref Rng & Units 12/19/2020 08/02/2020  FVC-Pre L 1.54 1.61  FVC-Predicted Pre % 39 41  Pre FEV1/FVC % % 85 96  FEV1-Pre L 1.31 1.55  FEV1-Predicted Pre % 47 55  DLCO uncorrected ml/min/mmHg 13.12 12.41  DLCO UNC% % 55 52  DLCO corrected ml/min/mmHg 13.12 12.27  DLCO COR %Predicted % 55 52  DLVA Predicted % 91 103       has a past medical history of (HFpEF) heart failure with preserved ejection fraction ([place]), Actinic keratosis, Adenomatous polyp of colon, Arthritis, Basal cell carcinoma, CAD (coronary artery disease), Cancer ([place]) (2021), Hyperlipidemia, Hypertension, Interstitial lung disease ([place]), Pneumonia, Pre-diabetes, and Respiratory failure with hypoxia ([place]).   reports that he has never smoked. He has quit using smokeless tobacco.  His smokeless tobacco use included chew.  Past Surgical History:  Procedure Laterality Date   APPENDECTOMY     BRONCHIAL WASHINGS  05/18/2020    Procedure: BRONCHIAL WASHINGS;  Surgeon: Margaretha Seeds, MD;  Location: [place];  Service: Pulmonary;;   CARDIAC CATHETERIZATION     PT DENIES   COLONOSCOPY     2011,2015,2021   COLONOSCOPY WITH PROPOFOL N/A 07/10/2020   Procedure: COLONOSCOPY WITH PROPOFOL;  Surgeon: Toledo, Benay Pike, MD;  Location: [facility] ENDOSCOPY;  Service: Gastroenterology;  Laterality: N/A;   CORONARY ARTERY BYPASS GRAFT     FLEXIBLE BRONCHOSCOPY N/A 04/24/2020   Procedure: FLEXIBLE BRONCHOSCOPY;  Surgeon: Ottie Glazier, MD;  Location: [facility] ORS;  Service: Thoracic;  Laterality: N/A;   IR SINUS/FIST TUBE CHK-NON GI  10/27/2019   open heart surgery     PORTACATH PLACEMENT Right 10/13/2019   Procedure: INSERTION PORT-A-CATH;  Surgeon: Herbert Pun, MD;  Location: [facility] ORS;  Service: General;  Laterality: Right;   RIGHT/LEFT HEART CATH AND CORONARY ANGIOGRAPHY N/A 10/03/2020   Procedure: RIGHT/LEFT HEART CATH AND CORONARY ANGIOGRAPHY;  Surgeon: Nelva Bush, MD;  Location: [place] [facility];  Service: Cardiovascular;  Laterality: N/A;  ROBOT ASSISTED LAPAROSCOPIC PARTIAL COLECTOMY  09/2019   VIDEO BRONCHOSCOPY N/A 05/18/2020   Procedure: VIDEO BRONCHOSCOPY WITHOUT FLUORO;  Surgeon: Margaretha Seeds, MD;  Location: [place];  Service: Pulmonary;  Laterality: N/A;    Allergies  Allergen Reactions   Ofev [Nintedanib] Diarrhea and Nausea Only    Immunization History  Administered Date(s) Administered   PFIZER(Purple Top)SARS-COV-2 Vaccination 06/09/2019, 06/30/2019, 01/18/2020   Zoster Recombinat (Shingrix) 01/12/2018, 03/21/2018    Family History  Problem Relation Age of Onset   Lung disease Father      Current Outpatient Medications:    aspirin EC 81 MG tablet, Take 1 tablet (81 mg total) by mouth daily. Swallow whole., Disp: , Rfl:    atorvastatin (LIPITOR) 20 MG tablet, Take 20 mg by mouth daily., Disp: , Rfl:    dextromethorphan-guaiFENesin (MUCINEX DM) 30-600 MG 12hr tablet, Take 1  tablet by mouth 2 (two) times daily as needed for cough., Disp: 60 tablet, Rfl: 0   furosemide (LASIX) 40 MG tablet, Take 1 tablet (40 mg total) by mouth daily., Disp: 30 tablet, Rfl: 5   metoprolol tartrate (LOPRESSOR) 25 MG tablet, Take 25 mg by mouth 2 (two) times daily., Disp: , Rfl:    mometasone (ELOCON) 0.1 % cream, Apply 1 application topically as directed. Qd to bid up to 5 days a week to aa psoriasis on arms until clear, then prn flares, Disp: 45 g, Rfl: 0   Pirfenidone (ESBRIET) 267 MG TABS, Take 1 tab by mouth three times daily for 7 days, then 2 tabs three times daily for 7 days, then 3 tabs three times daily thereafter., Disp: 207 tablet, Rfl: 0   predniSONE (DELTASONE) 5 MG tablet, TAKE 1 TABLET BY MOUTH DAILY WITH BREAKFAST, Disp: 30 tablet, Rfl: 2   temazepam (RESTORIL) 30 MG capsule, Take 30 mg by mouth at bedtime. , Disp: , Rfl:    Treprostinil (TYVASO) 0.6 MG/ML SOLN, Inhale into the lungs 4 (four) times daily., Disp: , Rfl:       Objective:   Vitals:   02/02/21 0959  BP: 120/68  Pulse: 64  Temp: 98.1 F (36.7 C)  TempSrc: Oral  SpO2: 99%  Weight: 226 lb 9.6 oz (102.8 kg)  Height: _0  (1.727 m)    Estimated body mass index is 34.45 kg/m as calculated from the following:   Height as of this encounter: _1  (1.727 m).   Weight as of this encounter: 226 lb 9.6 oz (102.8 kg).  _2 @  [employer]   02/02/21 0959  Weight: 226 lb 9.6 oz (102.8 kg)     Physical Exam Sitting on the wheelchair.  Has crackles.  No pedal edema.  Oxygen on.  Flat affect and mildly anxious.  Normal heart sounds.  Casimiro Needle at his side.      Assessment:       ICD-10-CM   1. IPF (idiopathic pulmonary fibrosis) ([place])  J84.112     2. Chronic hypoxemic respiratory failure (HCC)  J96.11     3. Medication monitoring encounter  Z51.81     4. WHO group 3 pulmonary arterial hypertension (HCC)  I27.23     5. Anxiety and depression  F41.9    F32.A           Plan:     Patient Instructions     ICD-10-CM   1. IPF (idiopathic pulmonary fibrosis) ([place])  J84.112     2. Chronic hypoxemic respiratory failure (HCC)  J96.11  3. Medication monitoring encounter  Z51.81     4. WHO group 3 pulmonary arterial hypertension (HCC)  I27.23     5. Anxiety and depression  F41.9    F32.A       Overall stable clinicallly but full dose Esbriet gave you extremely poor appetite and fatigue Noted that you are only tolerating 2 pills 3 times daily of Esbriet  You are tolerating inhaled Tyvaso well  Noted that you are having significant situational anxiety and depression secondary to the disease and the disease burden   plan contnue o2 4L [place] and adjust to pulse ox goa > 88% with exertion  -Meet with the [facility] to get a portable system  -Also on [email or web address] data backpacks for portable oxygen  Hopefully using the portable oxygen and leaving the house can improve your anxiety and depression -if not we can refer you to a psychiatrist or mental health counselor  Please contact Mr. Marlane Mingle of the support group -e-mail [email or web address] -he can help you with patient related support  Continue lasix  Continue tyvaso and increase per protocol (currently 7 times each time at 4 times per day)  Check liver function test today  Continue esbriet -at 2 pills 3 times daily with food and adequate spacing of 5 or 6 hours [we will not increase to the full dose because of side effect profile]   Followup Dr Chase Caller or APP - in 6 weeks to ensure esbiret start going well  - ILD clinic  -30-minute slot  -ILD symptom question and walking desaturation test on 4 or 5  L at follow-up  He requires intensive monitoring and will have to see him back soon  SIGNATURE    Dr. Brand Males, M.D., F.C.C.P,  Pulmonary and Critical Care Medicine Staff Physician, [address] Director - Interstitial Lung Disease  Program  Pulmonary [place] at [place], [place], [address]  Pager: [phone], If no answer or between  15:00h - 7:00h: call [phone] Telephone: [phone]  10:46 AM 02/02/2021

## 2021-02-02 NOTE — Patient Instructions (Addendum)
ICD-10-CM   1. IPF (idiopathic pulmonary fibrosis) ([place])  J84.112     2. Chronic hypoxemic respiratory failure (HCC)  J96.11     3. Medication monitoring encounter  Z51.81     4. WHO group 3 pulmonary arterial hypertension (HCC)  I27.23     5. Anxiety and depression  F41.9    F32.A       Overall stable clinicallly but full dose Esbriet gave you extremely poor appetite and fatigue Noted that you are only tolerating 2 pills 3 times daily of Esbriet  You are tolerating inhaled Tyvaso well  Noted that you are having significant situational anxiety and depression secondary to the disease and the disease burden   plan contnue o2 4L [place] and adjust to pulse ox goa > 88% with exertion  -Meet with the [facility] to get a portable system  -Also on [email or web address]  backpacks for portable oxygen  Hopefully using the portable oxygen and leaving the house can improve your anxiety and depression -if not we can refer you to a psychiatrist or mental health counselor  Please contact Mr. Marlane Mingle of the support group -e-mail [email or web address] -he can help you with patient related support  Continue lasix  Continue tyvaso and increase per protocol (currently 7 times each time at 4 times per day)  Check liver function test today  Continue esbriet -at 2 pills 3 times daily with food and adequate spacing of 5 or 6 hours [we will not increase to the full dose because of side effect profile]   Followup Dr Chase Caller or APP - in 6 weeks to ensure esbiret start going well  - ILD clinic  -30-minute slot  -ILD symptom question and walking desaturation test on 4 or 5  L at follow-up

## 2021-02-05 ENCOUNTER — Other Ambulatory Visit: Payer: Self-pay | Admitting: Pharmacist

## 2021-02-05 DIAGNOSIS — J84112 Idiopathic pulmonary fibrosis: Secondary | ICD-10-CM

## 2021-02-05 MED ORDER — PIRFENIDONE 267 MG PO TABS: 534.0000 mg | ORAL_TABLET | Freq: Three times a day (TID) | ORAL | 1 refills | Status: DC

## 2021-02-05 NOTE — Telephone Encounter (Signed)
Received refill request for Esbriet from [facility]  Patient last seen in clinic on 02/02/21 by Dr. Jamey Reas with plan to continue Esbriet at low dose of 2 tabs of 219m three times daily  LFTs drawn on 02/02/21 were stable  CMP Latest Ref Rng & Units 02/02/2021 10/13/2020 09/27/2020  Glucose 65 - 99 mg/dL - 160(H) 140(H)  BUN 8 - 27 mg/dL - 8 7(L)  Creatinine 0.76 - 1.27 mg/dL - 0.93 0.77  Sodium 134 - 144 mmol/L - 143 144  Potassium 3.5 - 5.2 mmol/L - 3.7 4.7  Chloride 96 - 106 mmol/L - 99 103  CO2 20 - 29 mmol/L - 27 26  Calcium 8.6 - 10.2 mg/dL - 9.4 9.3  Total Protein 6.0 - 8.3 g/dL 7.3 - -  Total Bilirubin 0.2 - 1.2 mg/dL 0.4 - -  Alkaline Phos 39 - 117 U/L 122(H) - -  AST 0 - 37 U/L 24 - -  ALT 0 - 53 U/L 31 - -   Next appt with Dr. RChase Callerscheduled for 03/21/21  DKnox Saliva PharmD, MPH, BCPS Clinical Pharmacist (Rheumatology and Pulmonology)

## 2021-02-12 ENCOUNTER — Inpatient Hospital Stay: Payer: Medicare Other | Attending: Oncology

## 2021-02-12 ENCOUNTER — Other Ambulatory Visit: Payer: Self-pay

## 2021-02-12 DIAGNOSIS — Z452 Encounter for adjustment and management of vascular access device: Secondary | ICD-10-CM | POA: Insufficient documentation

## 2021-02-12 DIAGNOSIS — C20 Malignant neoplasm of rectum: Secondary | ICD-10-CM | POA: Diagnosis present

## 2021-02-12 DIAGNOSIS — Z95828 Presence of other vascular implants and grafts: Secondary | ICD-10-CM

## 2021-02-12 MED ORDER — HEPARIN SOD (PORK) LOCK FLUSH 100 UNIT/ML IV SOLN
500.0000 [IU] | Freq: Once | INTRAVENOUS | Status: AC
  Administered 2021-02-12: 500 [IU] via INTRAVENOUS
  Filled 2021-02-12: qty 5

## 2021-02-12 MED ORDER — SODIUM CHLORIDE 0.9% FLUSH
10.0000 mL | Freq: Once | INTRAVENOUS | Status: AC
  Administered 2021-02-12: 10 mL via INTRAVENOUS
  Filled 2021-02-12: qty 10

## 2021-02-26 ENCOUNTER — Telehealth: Payer: Self-pay | Admitting: Internal Medicine

## 2021-02-26 NOTE — Telephone Encounter (Signed)
Was able to help pt w/out having to leave a message.

## 2021-02-28 ENCOUNTER — Ambulatory Visit (INDEPENDENT_AMBULATORY_CARE_PROVIDER_SITE_OTHER): Payer: Medicare Other | Admitting: Internal Medicine

## 2021-02-28 ENCOUNTER — Other Ambulatory Visit: Payer: Self-pay

## 2021-02-28 ENCOUNTER — Encounter: Payer: Self-pay | Admitting: Internal Medicine

## 2021-02-28 VITALS — BP 130/64 | HR 72 | Ht 69.0 in | Wt 219.0 lb

## 2021-02-28 DIAGNOSIS — I5032 Chronic diastolic (congestive) heart failure: Secondary | ICD-10-CM | POA: Diagnosis not present

## 2021-02-28 DIAGNOSIS — I2510 Atherosclerotic heart disease of native coronary artery without angina pectoris: Secondary | ICD-10-CM

## 2021-02-28 DIAGNOSIS — J84112 Idiopathic pulmonary fibrosis: Secondary | ICD-10-CM

## 2021-02-28 DIAGNOSIS — I272 Pulmonary hypertension, unspecified: Secondary | ICD-10-CM

## 2021-02-28 DIAGNOSIS — E785 Hyperlipidemia, unspecified: Secondary | ICD-10-CM

## 2021-02-28 NOTE — Progress Notes (Signed)
Follow-up Outpatient Visit Date: 02/28/2021  Primary Care Provider: Albina Billet, MD [address] [place] [address]  Chief Complaint: Shortness of breath  HPI:  Garrett Peterson is a 75 y.o. male with history of coronary artery disease status post CABG (2010), hypertension, hyperlipidemia, interstitial lung disease, and rectal cancer, who presents for follow-up of coronary artery disease with shortness of breath and fatigue.  He was last seen in our office in July by Garrett Bayley, NP, at which time he reported stable exertional dyspnea.  He was participating in cardiac rehab and had just been placed on Tyvaso and was hopeful that his breathing would improve.  He was continued on furosemide 40 mg daily based on elevated left heart pressures noted at the time of catheterization in May.  He has continued to follow-up with pulmonary and is on Tyvaso and Esbriet.  Unfortunately, he has not noticed any symptomatic improvement.  He continues to wear supplemental oxygen (4 L per nasal cannula).  Leg edema has been well controlled with furosemide 40 mg daily.  He denies chest pain, palpitations, and lightheadedness. --------------------------------------------------------------------------------------------------  Cardiovascular History & Procedures: Cardiovascular Problems: Coronary artery disease Interstitial lung disease   Risk Factors: Known coronary artery disease, hyperlipidemia, male gender, and age greater than 62   Cath/PCI: R/LHC (10/03/2020): LMCA 70% stenosis.  Sequential 80% and 70% proximal/mid LAD stenoses.  Chronic total occlusion of ostial LCx with 99% mid vessel stenosis between OM1 and OM2.  Chronic total occlusion of ostial RCA.  Widely patent LIMA-LAD and SVG-RPDA.  Sequential SVG-OM1-OM2 with 40% proximal/mid graft disease.  RA 14, RV 60/15, PA 60/23 (35), PCWP 23.  Fick CO/CI 4.7/2.2.  Thermodilution CO/CI 4.8/2.2.   CV Surgery: CABG (11/2008 at [facility]): LIMA to LAD, SVG  to ramus, SVG to OM1, and SVG to PDA   EP Procedures and Devices: None   Non-Invasive Evaluation(s): TTE (05/18/2020): Normal LV size with moderate LVH.  LVEF 65-70% with grade 1 diastolic dysfunction.  Mildly dilated RV with mildly reduced contraction.  Mild pulmonary hypertension.  Normal biatrial size.  Mild mitral and tricuspid regurgitation.  Negative bubble study.  Recent CV Pertinent Labs: Lab Results  Component Value Date   INR 1.1 05/17/2020   BNP 84.1 03/03/2020   K 3.7 10/13/2020   K 3.8 09/20/2011   MG 2.3 03/03/2020   BUN 8 10/13/2020   BUN 12 09/20/2011   CREATININE 0.93 10/13/2020   CREATININE 0.99 09/20/2011    Past medical and surgical history were reviewed and updated in EPIC.  Current Meds  Medication Sig   aspirin EC 81 MG tablet Take 1 tablet (81 mg total) by mouth daily. Swallow whole.   atorvastatin (LIPITOR) 20 MG tablet Take 20 mg by mouth daily.   furosemide (LASIX) 40 MG tablet Take 1 tablet (40 mg total) by mouth daily.   metoprolol tartrate (LOPRESSOR) 25 MG tablet Take 25 mg by mouth 2 (two) times daily.   Pirfenidone (ESBRIET) 267 MG TABS Take 2 tablets (534 mg total) by mouth in the morning, at noon, and at bedtime.   predniSONE (DELTASONE) 5 MG tablet TAKE 1 TABLET BY MOUTH DAILY WITH BREAKFAST   temazepam (RESTORIL) 30 MG capsule Take 30 mg by mouth at bedtime.    Treprostinil (TYVASO) 0.6 MG/ML SOLN Inhale into the lungs 4 (four) times daily.    Allergies: Ofev [nintedanib]  Social History   Tobacco Use   Smoking status: Never   Smokeless tobacco:  Former    Types: Nurse, children's Use: Never used  Substance Use Topics   Alcohol use: Yes    Alcohol/week: 0.0 standard drinks    Comment: OCCAS-not within the last year as of 09/27/2020   Drug use: Never    Family History  Problem Relation Age of Onset   Lung disease Father     Review of Systems: A 12-system review of systems was performed and was negative except as noted  in the HPI.  --------------------------------------------------------------------------------------------------  Physical Exam: BP 130/64 (BP Location: Left Arm, Patient Position: Sitting, Cuff Size: Large)   Pulse 72   Ht _0  (1.753 m)   Wt 219 lb (99.3 kg)   SpO2 96% Comment: on 4L O2  BMI 32.34 kg/m   General:  NAD. Neck: No JVD or HJR. Lungs: Coarse breath sounds bilaterally without wheezes or crackles. Heart: Regular rate and rhythm without murmurs, rubs, or gallops. Abdomen: Soft, nontender, nondistended. Extremities: No lower extremity edema.   Lab Results  Component Value Date   WBC 9.3 09/27/2020   HGB 14.2 09/27/2020   HCT 41.6 09/27/2020   MCV 102 (H) 09/27/2020   PLT 171 09/27/2020    Lab Results  Component Value Date   NA 143 10/13/2020   K 3.7 10/13/2020   CL 99 10/13/2020   CO2 27 10/13/2020   BUN 8 10/13/2020   CREATININE 0.93 10/13/2020   GLUCOSE 160 (H) 10/13/2020   ALT 31 02/02/2021    No results found for: CHOL, HDL, LDLCALC, LDLDIRECT, TRIG, CHOLHDL  --------------------------------------------------------------------------------------------------  ASSESSMENT AND PLAN: Coronary artery disease: No angina reported.  Continue current medications for secondary prevention including aspirin and atorvastatin.  Idiopathic pulmonary fibrosis, chronic HFpEF, and pulmonary hypertension: No significant symptomatic improvement since our last visit.  Continue Tyvaso and Esbriet with ongoing follow-up per Garrett Peterson.  We will continue with furosemide 40 mg daily.  BMP should be checked at upcoming follow-up visit with Garrett Peterson in a couple of weeks to ensure stable kidney function and potassium in the setting of chronic diuresis.  Hyperlipidemia: Continue atorvastatin 20 mg daily for target LDL less than 70; defer routine surveillance to Garrett Peterson.  Follow-up: Return to clinic in 6 months  Nelva Bush, MD 02/28/2021 3:36 PM

## 2021-02-28 NOTE — Patient Instructions (Signed)
Medication Instructions:   Your physician recommends that you continue on your current medications as directed. Please refer to the Current Medication list given to you today.  *If you need a refill on your cardiac medications before your next appointment, please call your pharmacy*   Lab Work:  None ordered  Testing/Procedures:  None ordered   Follow-Up: At [facility], you and your health needs are our priority.  As part of our continuing mission to provide you with exceptional heart care, we have created designated Provider Care Teams.  These Care Teams include your primary Cardiologist (physician) and Advanced Practice Providers (APPs -  Physician Assistants and Nurse Practitioners) who all work together to provide you with the care you need, when you need it.  We recommend signing up for the patient portal called "MyChart".  Sign up information is provided on this After Visit Summary.  MyChart is used to connect with patients for Virtual Visits (Telemedicine).  Patients are able to view lab/test results, encounter notes, upcoming appointments, etc.  Non-urgent messages can be sent to your provider as well.   To learn more about what you can do with MyChart, go to [email or web address].    Your next appointment:   6 month(s)  The format for your next appointment:   In Person  Provider:   You may see Nelva Bush, MD or one of the following Advanced Practice Providers on your designated Care Team:   [name], NP Christell Faith, PA-C Marrianne Mood, PA-C Cadence Prescott, [place]

## 2021-03-05 ENCOUNTER — Other Ambulatory Visit: Payer: Self-pay

## 2021-03-05 ENCOUNTER — Ambulatory Visit (INDEPENDENT_AMBULATORY_CARE_PROVIDER_SITE_OTHER): Payer: Medicare Other | Admitting: Dermatology

## 2021-03-05 DIAGNOSIS — D485 Neoplasm of uncertain behavior of skin: Secondary | ICD-10-CM

## 2021-03-05 DIAGNOSIS — C4442 Squamous cell carcinoma of skin of scalp and neck: Secondary | ICD-10-CM | POA: Diagnosis not present

## 2021-03-05 DIAGNOSIS — C4492 Squamous cell carcinoma of skin, unspecified: Secondary | ICD-10-CM

## 2021-03-05 DIAGNOSIS — L578 Other skin changes due to chronic exposure to nonionizing radiation: Secondary | ICD-10-CM

## 2021-03-05 HISTORY — DX: Squamous cell carcinoma of skin, unspecified: C44.92

## 2021-03-05 NOTE — Progress Notes (Signed)
   Follow-Up Visit   Subjective  Garrett Peterson is a 75 y.o. male who presents for the following: Follow-up (Patient here today for a spot at right ear that does not seem to be healing. ISK treated at area about 5 months ago with LN2. Patient advises some of the spot treated did peel off but never healed completely. ).  The following portions of the chart were reviewed this encounter and updated as appropriate:   Tobacco  Allergies  Meds  Problems  Med Hx  Surg Hx  Fam Hx     Review of Systems:  No other skin or systemic complaints except as noted in HPI or Assessment and Plan.  Objective  Well appearing patient in no apparent distress; mood and affect are within normal limits.  A focused examination was performed including face, neck. Relevant physical exam findings are noted in the Assessment and Plan.  Right postauricular 1.1 cm crusted papule   Assessment & Plan  Neoplasm of uncertain behavior of skin Right scalp / neck postauricular  Epidermal / dermal shaving  Lesion diameter (cm):  1.1 Informed consent: discussed and consent obtained   Timeout: patient name, date of birth, surgical site, and procedure verified   Procedure prep:  Patient was prepped and draped in usual sterile fashion Prep type:  Isopropyl alcohol Anesthesia: the lesion was anesthetized in a standard fashion   Anesthetic:  1% lidocaine w/ epinephrine 1-100,000 buffered w/ 8.4% NaHCO3 Instrument used: flexible razor blade   Hemostasis achieved with: pressure, aluminum chloride and electrodesiccation   Outcome: patient tolerated procedure well   Post-procedure details: sterile dressing applied and wound care instructions given   Dressing type: bandage and petrolatum    Destruction of lesion Complexity: extensive   Destruction method: electrodesiccation and curettage   Informed consent: discussed and consent obtained   Timeout:  patient name, date of birth, surgical site, and procedure  verified Procedure prep:  Patient was prepped and draped in usual sterile fashion Prep type:  Isopropyl alcohol Anesthesia: the lesion was anesthetized in a standard fashion   Anesthetic:  1% lidocaine w/ epinephrine 1-100,000 buffered w/ 8.4% NaHCO3 Curettage performed in three different directions: Yes   Electrodesiccation performed over the curetted area: Yes   Lesion length (cm):  1.1 Lesion width (cm):  1.1 Margin per side (cm):  0.2 Final wound size (cm):  1.5 Hemostasis achieved with:  pressure, aluminum chloride and electrodesiccation Outcome: patient tolerated procedure well with no complications   Post-procedure details: sterile dressing applied and wound care instructions given   Dressing type: bandage and petrolatum    Related Procedures Anatomic Pathology Report  Actinic Damage - chronic, secondary to cumulative UV radiation exposure/sun exposure over time - diffuse scaly erythematous macules with underlying dyspigmentation - Recommend daily broad spectrum sunscreen SPF 30+ to sun-exposed areas, reapply every 2 hours as needed.  - Recommend staying in the shade or wearing long sleeves, sun glasses (UVA+UVB protection) and wide brim hats (4-inch brim around the entire circumference of the hat). - Call for new or changing lesions.  Return for as scheduled.  Graciella Belton, RMA, am acting as scribe for Sarina Ser, MD . Documentation: I have reviewed the above documentation for accuracy and completeness, and I agree with the above.  Sarina Ser, MD

## 2021-03-05 NOTE — Patient Instructions (Addendum)
Wound Care Instructions  Cleanse wound gently with soap and water once a day then pat dry with clean gauze. Apply a thing coat of Petrolatum (petroleum jelly, "Vaseline") over the wound (unless you have an allergy to this). We recommend that you use a new, sterile tube of Vaseline. Do not pick or remove scabs. Do not remove the yellow or white "healing tissue" from the base of the wound.  Cover the wound with fresh, clean, nonstick gauze and secure with paper tape. You may use Band-Aids in place of gauze and tape if the would is small enough, but would recommend trimming much of the tape off as there is often too much. Sometimes Band-Aids can irritate the skin.  You should call the office for your biopsy report after 1 week if you have not already been contacted.  If you experience any problems, such as abnormal amounts of bleeding, swelling, significant bruising, significant pain, or evidence of infection, please call the office immediately.  FOR ADULT SURGERY PATIENTS: If you need something for pain relief you may take 1 extra strength Tylenol (acetaminophen) AND 2 Ibuprofen (261m each) together every 4 hours as needed for pain. (do not take these if you are allergic to them or if you have a reason you should not take them.) Typically, you may only need pain medication for 1 to 3 days.   If you have any questions or concerns for your doctor, please call our main line at [phone] press option 4 to reach your doctor's medical assistant. If no one answers, please leave a voicemail as directed and we will return your call as soon as possible. Messages left after 4 pm will be answered the following business day.   You may also send [place] message via [place] We typically respond to MyChart messages within 1-2 business days.  For prescription refills, please ask your pharmacy to contact our office. Our fax number is [phone]  If you have an urgent issue when the clinic is closed that  cannot wait until the next business day, you can page your doctor at the number below.    Please note that while we do our best to be available for urgent issues outside of office hours, we are not available 24/7.   If you have an urgent issue and are unable to reach [place] you may choose to seek medical care at your doctor's office, retail clinic, urgent care center, or emergency room.  If you have a medical emergency, please immediately call 911 or go to the emergency department.  Pager Numbers  - Dr. KNehemiah Massed [phone] - Dr. MLaurence Ferrari [phone] - Dr. SNicole Kindred [phone] In the event of inclement weather, please call our main line at [phone] an update on the status of any delays or closures.  Dermatology Medication Tips: Please keep the boxes that topical medications come in in order to help keep track of the instructions about where and how to use these. Pharmacies typically print the medication instructions only on the boxes and not directly on the medication tubes.   If your medication is too expensive, please contact our office at [phone] 4 or send [place] message through [place]   We are unable to tell what your co-pay for medications will be in advance as this is different depending on your insurance coverage. However, we may be able to find a substitute medication at lower cost or fill out paperwork to get insurance to cover a needed  medication.   If a prior authorization is required to get your medication covered by your insurance company, please allow [place] 1-2 business days to complete this process.  Drug prices often vary depending on where the prescription is filled and some pharmacies may offer cheaper prices.  The website www.goodrx.com contains coupons for medications through different pharmacies. The prices here do not account for what the cost may be with help from insurance (it may be cheaper with your insurance), but the website can give you the  price if you did not use any insurance.  - You can print the associated coupon and take it with your prescription to the pharmacy.  - You may also stop by our office during regular business hours and pick up a GoodRx coupon card.  - If you need your prescription sent electronically to a different pharmacy, notify our office through [facility] or by phone at [phone] option 4.

## 2021-03-07 ENCOUNTER — Encounter: Payer: Self-pay | Admitting: Dermatology

## 2021-03-14 ENCOUNTER — Telehealth: Payer: Self-pay

## 2021-03-14 NOTE — Telephone Encounter (Signed)
-----  Message from Ralene Bathe, MD sent at 03/14/2021 12:33 PM EDT ----- Diagnosis synopsis: See below:Abnormal  Comment: Specimen A-Skin Biopsy, Right Postauricular: INVASIVE  SQUAMOUS CELL CARCINOMA, WELL DIFFERENTIATED.  DEEP  TISSUE EDGE IS INVOLVED.  Cancer - SCC Already treated Recheck next visit

## 2021-03-14 NOTE — Telephone Encounter (Signed)
Advised patient of results/hd

## 2021-03-15 LAB — ANATOMIC PATHOLOGY REPORT

## 2021-03-19 ENCOUNTER — Other Ambulatory Visit: Payer: Self-pay | Admitting: Internal Medicine

## 2021-03-21 ENCOUNTER — Ambulatory Visit (INDEPENDENT_AMBULATORY_CARE_PROVIDER_SITE_OTHER): Payer: Medicare Other | Admitting: Internal Medicine

## 2021-03-21 ENCOUNTER — Encounter: Payer: Self-pay | Admitting: Internal Medicine

## 2021-03-21 ENCOUNTER — Other Ambulatory Visit: Payer: Self-pay

## 2021-03-21 ENCOUNTER — Telehealth: Payer: Self-pay | Admitting: Internal Medicine

## 2021-03-21 ENCOUNTER — Telehealth: Payer: Self-pay | Admitting: Pharmacist

## 2021-03-21 VITALS — BP 122/70 | HR 56 | Temp 97.8°F | Ht 69.0 in | Wt 225.8 lb

## 2021-03-21 DIAGNOSIS — I2723 Pulmonary hypertension due to lung diseases and hypoxia: Secondary | ICD-10-CM | POA: Diagnosis not present

## 2021-03-21 DIAGNOSIS — Z5181 Encounter for therapeutic drug level monitoring: Secondary | ICD-10-CM | POA: Diagnosis not present

## 2021-03-21 DIAGNOSIS — R079 Chest pain, unspecified: Secondary | ICD-10-CM

## 2021-03-21 DIAGNOSIS — R0609 Other forms of dyspnea: Secondary | ICD-10-CM

## 2021-03-21 DIAGNOSIS — J9611 Chronic respiratory failure with hypoxia: Secondary | ICD-10-CM

## 2021-03-21 DIAGNOSIS — R7989 Other specified abnormal findings of blood chemistry: Secondary | ICD-10-CM

## 2021-03-21 DIAGNOSIS — J84112 Idiopathic pulmonary fibrosis: Secondary | ICD-10-CM

## 2021-03-21 DIAGNOSIS — M7989 Other specified soft tissue disorders: Secondary | ICD-10-CM

## 2021-03-21 LAB — CBC WITH DIFFERENTIAL/PLATELET
Basophils Absolute: 0.1 10*3/uL (ref 0.0–0.1)
Basophils Relative: 0.7 % (ref 0.0–3.0)
Eosinophils Absolute: 0.4 10*3/uL (ref 0.0–0.7)
Eosinophils Relative: 4.4 % (ref 0.0–5.0)
HCT: 40.4 % (ref 39.0–52.0)
Hemoglobin: 13.8 g/dL (ref 13.0–17.0)
Lymphocytes Relative: 12.5 % (ref 12.0–46.0)
Lymphs Abs: 1.3 10*3/uL (ref 0.7–4.0)
MCHC: 34 g/dL (ref 30.0–36.0)
MCV: 99.8 fl (ref 78.0–100.0)
Monocytes Absolute: 0.9 10*3/uL (ref 0.1–1.0)
Monocytes Relative: 9.3 % (ref 3.0–12.0)
Neutro Abs: 7.4 10*3/uL (ref 1.4–7.7)
Neutrophils Relative %: 73.1 % (ref 43.0–77.0)
Platelets: 185 10*3/uL (ref 150.0–400.0)
RBC: 4.05 Mil/uL — ABNORMAL LOW (ref 4.22–5.81)
RDW: 13.1 % (ref 11.5–15.5)
WBC: 10.1 10*3/uL (ref 4.0–10.5)

## 2021-03-21 LAB — HEPATIC FUNCTION PANEL
ALT: 27 U/L (ref 0–53)
AST: 27 U/L (ref 0–37)
Albumin: 4.1 g/dL (ref 3.5–5.2)
Alkaline Phosphatase: 116 U/L (ref 39–117)
Bilirubin, Direct: 0 mg/dL (ref 0.0–0.3)
Total Bilirubin: 0.5 mg/dL (ref 0.2–1.2)
Total Protein: 7.4 g/dL (ref 6.0–8.3)

## 2021-03-21 LAB — BRAIN NATRIURETIC PEPTIDE: Pro B Natriuretic peptide (BNP): 141 pg/mL — ABNORMAL HIGH (ref 0.0–100.0)

## 2021-03-21 LAB — BASIC METABOLIC PANEL
BUN: 10 mg/dL (ref 6–23)
CO2: 36 mEq/L — ABNORMAL HIGH (ref 19–32)
Calcium: 9.1 mg/dL (ref 8.4–10.5)
Chloride: 98 mEq/L (ref 96–112)
Creatinine, Ser: 1.08 mg/dL (ref 0.40–1.50)
GFR: 66.53 mL/min (ref >60.00)
Glucose, Bld: 136 mg/dL — ABNORMAL HIGH (ref 70–99)
Potassium: 3.9 mEq/L (ref 3.5–5.1)
Sodium: 141 mEq/L (ref 135–145)

## 2021-03-21 LAB — D-DIMER, QUANTITATIVE: D-Dimer, Quant: 1.45 mcg/mL FEU — ABNORMAL HIGH (ref <0.50)

## 2021-03-21 NOTE — Telephone Encounter (Signed)
-  ddimer high Creat normal  Plan  - should have CTAngio rule out PE 03/21/2021 or 03/22/21 - butjust prior that same day - should have HRCT for the ILD - get duplex LE  Can do it in [place] or [place]  Results for DEAVEN, URWIN (MRN [id number]) as of 03/21/2021 14:54  Ref. Range 03/21/2021 11:30  D-Dimer, Quant Latest Ref Range: <0.50 mcg/mL FEU 1.45 (H)     LABS    PULMONARY No results for input(s): PHART, PCO2ART, PO2ART, HCO3, TCO2, O2SAT in the last 168 hours.  Invalid input(s): PCO2, PO2  CBC Recent Labs  Lab 03/21/21 1130  HGB 13.8  HCT 40.4  WBC 10.1  PLT 185.0    COAGULATION No results for input(s): INR in the last 168 hours.  CARDIAC  No results for input(s): TROPONINI in the last 168 hours. Recent Labs  Lab 03/21/21 1130  PROBNP 141.0*     CHEMISTRY Recent Labs  Lab 03/21/21 1130  NA 141  K 3.9  CL 98  CO2 36*  GLUCOSE 136*  BUN 10  CREATININE 1.08  CALCIUM 9.1   Estimated Creatinine Clearance: 68.6 mL/min (by C-G formula based on SCr of 1.08 mg/dL).   LIVER Recent Labs  Lab 03/21/21 1130  AST 27  ALT 27  ALKPHOS 116  BILITOT 0.5  PROT 7.4  ALBUMIN 4.1     INFECTIOUS No results for input(s): LATICACIDVEN, PROCALCITON in the last 168 hours.   ENDOCRINE CBG (last 3)  No results for input(s): GLUCAP in the last 72 hours.       IMAGING x48h  - image(s) personally visualized  -   highlighted in bold No results found.

## 2021-03-21 NOTE — Telephone Encounter (Signed)
Received PAP renewal application for [facility] through which patient receives Tyvaso. Placed in Dr. Golden Pop folder to be signed and returned to pharmacy team  [name], PharmD, MPH, BCPS Clinical Pharmacist (Rheumatology and Pulmonology)

## 2021-03-21 NOTE — Telephone Encounter (Signed)
Called and spoke with pt letting him know the results of the d-dimer lab test and stated to him that we were going to have him get a CTa as well as a doppler study to rule out PE/blood clots. Stated to him that we would try to see if the HRCT could be done same day as the other tests and he verbalized understanding. Orders have been placed. Nothing further needed.

## 2021-03-21 NOTE — Patient Instructions (Addendum)
ICD-10-CM   1. Chronic hypoxemic respiratory failure ([place])  J96.11 Ambulatory Referral for DME    2. IPF (idiopathic pulmonary fibrosis) ([place])  J84.112 Ambulatory Referral for DME    3. WHO group 3 pulmonary arterial hypertension (H[facility])  I27.23     4. DOE (dyspnea on exertion)  R06.09     5. Medication monitoring encounter  Z51.81      You are having progressive shortness of breath and worsening hypoxemia Need to understand why but concerned about worsening IPF  Plan -Do CBC with differential, chemistry and liver function test, BNP, D-dimer  today -rule out anemia, chf, blood clot  - do in [place] -Do echocardiogram -again to check if the heart pump is okay - can do in [place] - Do high-resolution CT scan of the chest supine and prone to see if the pulmonary fibrosis is worse  - can do in [place] -If possible do spirometry and DLCO next few weeks  - can do in [place]  -Blood work can be done in [place] while the rest of the test can be done in [place] -Take an order for  concentrator that can go up to 10 L nasal cannula - Glad you have energy back up at home  - keep pulse ox >= 88% at rest and >=84% with exertion    - avoid passing out  - skin cancer: treatment by dermatologist  Follow-up - Video visit 30 minutes with Dr. Chase Caller or nurse practitioner to discuss results  - next few weeks - consider short course steroids or discuss goals or other treatment based on results

## 2021-03-21 NOTE — Progress Notes (Signed)
Previous [facility] pulmonary encounter: 07/04/20- Dr. Chase Caller  Referred to the ILD center for comprehensive evaluation by Dr. Rodman Pickle primary pulmonologist 75 year old male, never smoked.  Significant for chronic hypoxic respiratory failure, coronary artery disease, hypertension.  Patient of Dr. Chase Caller. Started OFEV in April 2022.   Garrett Peterson 75 y.o. -very complicated story.  Details of the story is copied and pasted above.  Meeting him for the first time with his wife.  It appears that he was previously healthy as of April 2021.  Although CT scan of the chest at the time of my personal visualization showed presence of early ILD [CT abdomen 10 years prior in 2011 had no ILD].  He was diagnosed with anorectal cancer.  He was undergoing treatment at [facility].  Chemotherapy as above.  Then in the fall/winter 2020 when he started falling ill with respiratory illnesses.  This is all documented above.  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  CT scan of the chest by October 2021 started showing significant amount of groundglass opacities.  He only had ANA test, rheumatoid factor and angiotensin-converting enzyme.  This is all negative.  For serology.  Otherwise has not had any other serology.  He had hypersensitive pneumonitis panel this was negative.  This was at outside facility [facility] clinic pulmonology program.  He has had 2 hospitalizations the most recent one being in January.  It appears that he was treated with prednisone each time.  It is unclear to me as this to him and his wife if he has been on chronic prednisone but reading the notes it appears so.  Currently is just finished a taper starting at 60 mg/day in January and finished at 20 mg and stop.  He says since stopping yesterday or today he is beginning to feel a little bit worse.  Room air oxygen at rest was fine but when he desaturated after he walked more than 2 laps.  However he needed 5 L to correct.  Most recent  echocardiogram was normal.  Most recent lavage showed mostly neutrophils.  Again culture negative.  His most recent CT scan of the chest in February 2022 without contrast shows improvement in groundglass opacities but no fibrosis.  His serologies ANA negative and rheumatoid factor negative and angiotensin converting enzyme negative  Definitely earlier on in the year it was not consistent with UIP and suggested alternate diagnosis.  Unclear to me if he has probable UIP pattern at this point in time in February 2022.  We did not have time to do an extensive detailed ILD questionnaire.  It appears that his goals are to get better and also feel better at the same time control his cancer.  They are also wondering about prognosis.  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  He is currently looking into getting radiation locally.  Oncologist wants to do a colonoscopy.  She feels the recurrence is because all his chemo has been on hold because of respiratory issues.   SErology Dec 2021 - [name]  ANA Direct - [facility] Negative Negative   RA Latex Turbid. - [facility] 0.0 - 13.9 IU/mL <10.0   A.Fumigatus #1 Abs - [facility] Negative Negative  Micropolyspora faeni, IgG - [facility] Negative Negative  Thermoactinomyces vulgaris, IgG - [facility] Negative Negative  A. Pullulans Abs -  [facility] Negative Negative  Thermoact. Saccharii - [facility] Negative Negative  Pigeon Serum Abs - [facility] Negative Negative   Angio Convert Enzyme - [facility] 14 - 82 U/L 26   Walk tst 07/04/2020    94% - ra at rest -> walked 2 laps without desat and dropped to 86% in middle of 3rd lap. Then need 5L [place] to correct to walk 3 laps in office   CT chest 06/23/20   IMPRESSION: 1. 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 Fibrosis has progressed since 03/02/2020 chest CT with resolved consolidative opacities. 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 UIP is difficult to exclude but is less favored. Follow-up high-resolution chest CT suggested in 6-12 months. Findings are indeterminate for UIP per consensus guidelines: Diagnosis of Idiopathic Pulmonary Fibrosis: An Official ATS/ERS/JRS/ALAT Clinical Practice Guideline. [place], Iss 5, [phone], Jan 11 2017. 2. Stable mild cardiomegaly. 3. Aortic Atherosclerosis (ICD10-I70.0).     Electronically Signed   By: [name] M.D.   On: 06/24/2020 15:15      08/02/2020 Patient presents today for 1 month follow-up with spirometry/DLCO. Patient was discussed at ILD conference in March 2022, diagnosed with IPF. Recent exacerbation likely chemo related. Started on Ofev. Patient was referred for right heart cath and referred to pulmonary rehab.  He is currently on an extended prednisone taper. He started 109m prednisone dose today x 2 weeks. He is still taking Bactrim. Breathing has improved. He has not started antifibrotic medication yet, his wifes states that their insurance has improved OFEV medication.   PFT 08/02/2020 - FVC 1.61 (41%), FEV1 1.55 (55%), ratio 96, DLCOcor 12.27 (52%) 09/21/2020 Patient presents today for 1 month follow-up. Accompanied by his son. He stop pulmonary for a week or two since having bronchitis. Cough is better since taking mucinex. He is on 4L nasal cannula. He is having 6-7 loose stools a day. Diarrhea started when he went up to 2 tabs of Ofev. He has up coming hearth cath, date to be determined. Son reports some new leg swelling. Due for LFTs today.   HEart cth  10/03/20  Conclusions: Severe native coronary artery disease including 70% distal LMCA and mid LAD disease, sequential 100% ostial and 99% proximal LCx lesions, and chronic total occlusion of ostial RCA. Widely patent LIMA-LAD and SVG-rPDA. Patent SVG-OM2 with 40% stenosis in proximal/mid portion of SVG. Mildly elevated left heart filling pressure (LVEDP 20 mmHg, PCWP 23 mmHg). Moderately elevated right heart filling pressure (mean RAP 14 mmHg, LVEDP 20 mmHg). Moderate pulmonary hypertension (mean PAP 35 mmHg, PVR 2.6 WU). Mildly reduced cardiac output/index.   Recommendations: Aggressive secondary prevention of coronary artery disease. Initiate furosemide 40 mg PO daily for HFpEF.  BMP to be drawn when patient is seen for follow-up 10/13/2020. Ongoing management of pulmonary fibrosis and pulmonary hypertension per Dr. RChase Caller   CNelva Bush MD [facility]      OV 10/18/2020  Subjective:  Patient ID: Garrett Peterson male , DOB: 128-Apr-1947, age 75y.o. , MRN: [id number], [address] [id number] TAlbina Billet MD Patient Care Team: TAlbina Billet MD as PCP - General (Internal Medicine) End, CHarrell Gave MD as PCP - Cardiology (Cardiology) SClent Jacks RN as  Oncology Nurse Navigator Earlie Server, MD as Consulting Physician (Oncology)  This Provider for this visit: Treatment Team:  Attending Provider: Brand Males, MD  Type of visit: Telephone/Video Circumstance: COVID-19 national emergency Identification of patient Garrett Peterson with [birth year] and MRN [id number] - 2 person identifier Risks: Risks, benefits, limitations of telephone visit explained. Patient understood and verbalized agreement to proceed Anyone else on call: just patient Patient location: [phone] This provider location: Provider home because provider is isolating due to covid   10/18/2020 -  followup IPF.   HPI ANSELMO REIHL 75 y.o. - Off ofev since  09/21/20 due to diarrrhea. He called 09/28/20 and we told him to stop ofev.  But now he tells me 10/18/2020 that for 3 weeks he is taking 150m bid. No diarrhea other than going to BR frequently but is tolerabl and small amounts only. Respiration wise he is stable. Starts getting worse after lunch. Still on 4 LNC o2. No change Not on active cancer Rx. Has been cleared. Had RHC- see ablve. Srated on new lasix and helped breathing somewhat. Is attending pulm rehab  Discussed Include in patients with ILD   - Right heart cath :  PVR > 3, PCWP </= 15, Pmap >/=  25 -  Patient needed to be able to walk 1023m 300 feet on a 43m84mlk test  - he does have exclusionary criteria - his PVR is < 3 and PCWP is  15 but he is interested in applying for tyvasos. Discussed side effect profile briefly       OV 12/19/2020  Subjective:  Patient ID: Garrett Peterson , DOB: [birth year]age [age]o. , MRN: [id number]: [id number] TatAlbina BilletD Patient Care Team: TatAlbina BilletD as PCP - General (Internal Medicine) End, ChrHarrell GaveD as PCP - Cardiology (Cardiology) StaClent JacksN as Oncology Nurse Navigator Yu,Earlie ServerD as Consulting Physician (Oncology)  This Provider for this visit: Treatment Team:  Attending Provider: RamBrand MalesD    12/19/2020 -   Chief Complaint  Patient presents with   Follow-up    PFT performed today.  Pt states that he has had a lot of postnasal drainage in the mornings. Pt states his breathing is about the same since last visit. Pt is on Tyvaso and states that has been working well.   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 -presents for follow-up with his family member.  Overall he stable.  Symptom scores are stable.  He had pulmonary function test.?  Quality of respiratory maneuver but his FVC shows 4% decline compared to 6 months ago of 5 months ago.  DLCO is stable.  He himself feels stable.  He is on inhaled treprostinil.  He feels inhaled treprostinil since early July 2022 is improving his shortness of breath.  He is using it 4 times daily and each time 7 times.  Plan is to escalate to close to 12 times each time.  He is not doing nintedanib.  He does not want to do nintedanib.  He rechallenged himself again for the second time and had side effects.  He feels is  because his GI motility is different after his anorectal cancer.  He wants to know if he can try any natural treatment.  He is particularly interested in pine pollen.  I told him that I do not know much about this medication but would be open to him trying it.  He also wanted to know what to do about sinus drainage which is the early in the morning for a few hours and is having to sneeze a lot.  His family member asked if he should turn down the humidifier which I said was okay.  Can also try saline nasal spray to clear it.  They wanted know about oxygen use.  He is using 4 L.  I advised him to use it continuously.  Also advised to adjusted for pulse ox goal greater than 88% with exertion.     OV 02/02/2021  Subjective:  Patient ID: Garrett Peterson, male , DOB: 03-17-1946 , age 37 y.o. , MRN: [id number] , ADDRESS: [place] [id number] PCP Albina Billet, MD Patient Care Team: Albina Billet, MD as PCP - General (Internal Medicine) End, Harrell Gave, MD as PCP - Cardiology (Cardiology) Clent Jacks, RN as Oncology Nurse Navigator Earlie Server, MD as Consulting Physician (Oncology)  This Provider for this visit: Treatment Team:  Attending Provider: Brand Males, MD   Follow-up IPF [exacerbated by radiation therapy  for anal rectal cancer )  -Intolerant to nintedanib spring 2022  -Diagnosis on multidisciplinary case conference 2022  - start esbriet Aug 2022 WHO group 3 pulmonary hypertension -started Tyvaso early November 11, 2020 Chronic hypoxemic respiratory failure  -4 L due to the above   02/02/2021 -   Chief Complaint  Patient presents with   Follow-up    Pt states he is about the same since last visit. Still becomes SOB with exertion.     HPI Garrett Peterson 75 y.o. -presents for follow-up.  He presents with [name] his grandson.  He has completed pulmonary rehabilitation.  They are reporting still 4 L of oxygen use at rest and 5 L with exertion.  He is now on pirfenidone.  He tried to escalate to 3 pills 3 times daily but this caused GI issues and low appetite.  So he is just taking 2 pills 3 times daily.  His grandson states that at pulmonary rehab and 3 L he did desaturate to 83%.  At home on 4 L he will desaturate into the 80s.  But at 5 L he seems okay.  They want a portable oxygen system.  He says that the restricted lifestyle from pulmonary fibrosis is making him more anxious and depressed.  In fact his anxiety and depression scores have gone up.  He feels portable oxygen will help him.  He wants portable oxygen.  I offered mental health counseling but he wants to try portable oxygen first.  He is willing to talk to the support group leader.  He has questions about life expectancy which I told him and the natural history would be few to several years.  The hope is that these antifibrotic's and keep him stable to new medications, long.  Also expressed uncertainty with the course.  He processed all this.  He is frustrated by his disease.  He again had questions about the goal of the inhaled treprostinil which I explained.  At this point in time he is accepted using the oxygen but wants portable.  He will use inhaled  treprostinil and also the pirfenidone.  He will check liver function test today.  Were  not sure of the rehabilitation helped him.  His main purpose right now is to be able to get out of the house and have improvements in his anxiety and depression.  We discussed clinical trials is not eligible because of his cancer history.       OV 03/21/2021  Subjective:  Patient ID: Garrett Peterson, male , DOB: 1945/12/23 , age 53 y.o. , MRN: [id number] , ADDRESS: [place] [address] PCP Albina Billet, MD Patient Care Team: Albina Billet, MD as PCP - General (Internal Medicine) End, Harrell Gave, MD as PCP - Cardiology (Cardiology) Clent Jacks, RN as Oncology Nurse Navigator Earlie Server, MD as Consulting Physician (Oncology)  This Provider for this visit: Treatment Team:  Attending Provider: Brand Males, MD    03/21/2021 -   Chief Complaint  Patient presents with   Follow-up    Pt states his breathing has become worse since last visit.    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 -returns for follow-up with his grandson [name].  He tells me for the last 10 days or so starting around 03/09/2021 he started noticing increasing shortness of breath with exertion.  He uses 6 L and sometimes evenw with that with ex he drops to 70s  He does ADLs.  He says it takes around 25 minutes for him to take a shower and change his clothes.  4 to 6 weeks ago it used to take 18 minutes or 20 minutes.  Definitely takes longer.  He finds it slower to go to the mailbox and come back but nevertheless he is able to do all his  ADLs.He is tolerating time also quite well taking at all times each time 4 times daily.  No side effects.  He is tolerating pirfenidone at 2 pills 3 times daily.  His latest CT scan of the chest was in February 2022.  His last pulmonary function test was in August 2022.  Noted below that his symptoms were significantly worse.  We did a walking desaturation test and shows even on 8L 150 feet x 3 laps he desaturates   He did have a right-sided neck sub auricular area -skin biopsy on 03/05/2021.  I reviewed the result.  It shows squamous cell carcinoma.  He is waiting a call back from the dermatologist/surgeon.  But he and his grandson seem aware of the result.  He does have energy back up athome    Last CT FEb 2022 with progression of ILd/IPF since Oct 2021 Last PFT aug 2022 with progresion since march 2022 Cardiac cath May 2022 shows slightly reduced EF [January 2022 echocardiogram EF 66%] Not on anticoagulation No worsening edema Last creatinine 0.9 mg percent June 2022 Normal liver function test September 2022 Normal hemoglobin May 2022 last checked on      SYMPTOM SCALE - ILD 07/04/2020  08/02/2020  12/19/2020 Tyvaso since early July 2022 Off ofev  02/02/2021 Tyvasos Esbiret 2 pills tid 03/21/2021 Tryvaso 12 times qid, and esbriet 2  pills tid  O2 use 4L  4L 4L 4L rest, 5L ex 4L rest, 6L ex but is needed 8-10 when tested  Shortness of Breath 0 -> 5 scale with 5 being worst (score 6 If unable to do)      At rest 1 0 5 2.5 3  Simple tasks - showers, clothes change, eating, shaving 4 3 3._0 Household (dishes, doing bed, laundry) x x x x 4  Shopping x x x x 4  Walking level at own pace 3 3 x x (3.5)  Walking up Stairs _1 Total (30-36) Dyspnea Score 12 10 13.5 11.5 19  How bad is your cough? 0 0 2  0  How bad is your fatigue _2 How bad is nausea 0 0 0 0 0  How bad is vomiting?  0 0 0 0 0  How bad is diarrhea? 0 0 1 0 0  How bad is anxiety? _3 How bad is  depression 5 3 2._4 Simple office walk 185 feet x  3 laps goal with forehead probe 03/21/2021  03/21/2021   O2 used 4L 8L  Number laps completed 1 lap only 3 laps  Comments about pace avvg pace   Resting Pulse Ox/HR 100% and 59/min   Final Pulse Ox/HR 86% and 81/min 84% at end of 3rd lap on 8L  Desaturated </= 88% yes   Desaturated <= 3% points yes   Got Tachycardic >/= 90/min no   Symptoms at end of test dyspnea   Miscellaneous comments worse      PFT  PFT Results Latest Ref Rng & Units 12/19/2020 08/02/2020  FVC-Pre L 1.54 1.61  FVC-Predicted Pre % 39 41  Pre FEV1/FVC % % 85 96  FEV1-Pre L 1.31 1.55  FEV1-Predicted Pre % 47 55  DLCO uncorrected ml/min/mmHg 13.12 12.41  DLCO UNC% % 55 52  DLCO corrected ml/min/mmHg 13.12 12.27  DLCO COR %Predicted % 55 52  DLVA Predicted % 91 103       has a past medical history of (HFpEF) heart failure with preserved ejection fraction ([place]), Actinic keratosis, Adenomatous polyp of colon, Arthritis, Basal cell carcinoma, CAD (coronary artery disease), Cancer ([place]) (2021), Hyperlipidemia, Hypertension, Interstitial lung disease ([place]), Pneumonia, Pre-diabetes, Respiratory failure with hypoxia ([place]), and 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   reports that he has never smoked. He has quit using smokeless tobacco.  His smokeless tobacco use included chew.  Past Surgical History:  Procedure Laterality Date   APPENDECTOMY     BRONCHIAL WASHINGS  05/18/2020   Procedure: BRONCHIAL WASHINGS;  Surgeon: Margaretha Seeds, MD;  Location: [facility];  Service: Pulmonary;;   CARDIAC CATHETERIZATION     PT DENIES   COLONOSCOPY     2011,2015,2021   COLONOSCOPY WITH PROPOFOL N/A 07/10/2020   Procedure: COLONOSCOPY WITH PROPOFOL;  Surgeon: Toledo, Benay Pike, MD;  Location: [facility];  Service: Gastroenterology;  Laterality: N/A;   CORONARY ARTERY BYPASS GRAFT     FLEXIBLE BRONCHOSCOPY N/A 04/24/2020   Procedure: FLEXIBLE  BRONCHOSCOPY;  Surgeon: Ottie Glazier, MD;  Location: [facility];  Service: Thoracic;  Laterality: N/A;   IR SINUS/FIST TUBE CHK-NON GI  10/27/2019   open heart surgery     PORTACATH PLACEMENT Right 10/13/2019   Procedure: INSERTION  PORT-A-CATH;  Surgeon: Herbert Pun, MD;  Location: [facility];  Service: General;  Laterality: Right;   RIGHT/LEFT HEART CATH AND CORONARY ANGIOGRAPHY N/A 10/03/2020   Procedure: RIGHT/LEFT HEART CATH AND CORONARY ANGIOGRAPHY;  Surgeon: Nelva Bush, MD;  Location: [facility];  Service: Cardiovascular;  Laterality: N/A;   ROBOT ASSISTED LAPAROSCOPIC PARTIAL COLECTOMY  09/2019   VIDEO BRONCHOSCOPY N/A 05/18/2020   Procedure: VIDEO BRONCHOSCOPY WITHOUT FLUORO;  Surgeon: Margaretha Seeds, MD;  Location: [facility];  Service: Pulmonary;  Laterality: N/A;    Allergies  Allergen Reactions   Ofev [Nintedanib] Diarrhea and Nausea Only    Immunization History  Administered Date(s) Administered   Influenza, High Dose Seasonal PF 03/19/2021   PFIZER(Purple Top)SARS-COV-2 Vaccination 06/09/2019, 06/30/2019, 01/18/2020   Zoster Recombinat (Shingrix) 01/12/2018, 03/21/2018    Family History  Problem Relation Age of Onset   Lung disease Father      Current Outpatient Medications:    aspirin EC 81 MG tablet, Take 1 tablet (81 mg total) by mouth daily. Swallow whole., Disp: , Rfl:    atorvastatin (LIPITOR) 20 MG tablet, Take 20 mg by mouth daily., Disp: , Rfl:    furosemide (LASIX) 40 MG tablet, TAKE 1 TABLET BY MOUTH ONCE DAILY, Disp: 30 tablet, Rfl: 5   metoprolol tartrate (LOPRESSOR) 25 MG tablet, Take 25 mg by mouth 2 (two) times daily., Disp: , Rfl:    Pirfenidone (ESBRIET) 267 MG TABS, Take 2 tablets (534 mg total) by mouth in the morning, at noon, and at bedtime., Disp: 540 tablet, Rfl: 1   predniSONE (DELTASONE) 5 MG tablet, TAKE 1 TABLET BY MOUTH DAILY WITH BREAKFAST, Disp: 30 tablet, Rfl: 2   temazepam (RESTORIL) 30 MG capsule, Take 30 mg  by mouth at bedtime. , Disp: , Rfl:    Treprostinil (TYVASO) 0.6 MG/ML SOLN, Inhale into the lungs 4 (four) times daily., Disp: , Rfl:       Objective:   Vitals:   03/21/21 1030  BP: 122/70  Pulse: (!) 56  Temp: 97.8 F (36.6 C)  TempSrc: Oral  SpO2: 98%  Weight: 225 lb 12.8 oz (102.4 kg)  Height: _0  (1.753 m)    Estimated body mass index is 33.34 kg/m as calculated from the following:   Height as of this encounter: _1  (1.753 m).   Weight as of this encounter: 225 lb 12.8 oz (102.4 kg).  _2 @  Filed Weights   03/21/21 1030  Weight: 225 lb 12.8 oz (102.4 kg)     Physical Exam    General: No distress. Sitting on wheel chair O2 On Neuro: Alert and Oriented x 3. GCS 15. Speech normal Psych: Pleasant Resp:  Barrel Chest - no.  Wheeze - no, Crackles - yes bilateral lower lobe, No overt respiratory distress CVS: Normal heart sounds. Murmurs - no Ext: Stigmata of Connective Tissue Disease - no HEENT: Normal upper airway. PEERL +. No post nasal drip        Assessment:       ICD-10-CM   1. Chronic hypoxemic respiratory failure ([place])  J96.11 Ambulatory Referral for DME    2. IPF (idiopathic pulmonary fibrosis) ([place])  J84.112 Ambulatory Referral for DME    3. WHO group 3 pulmonary arterial hypertension (HCC)  I27.23     4. DOE (dyspnea on exertion)  R06.09     5. Medication monitoring encounter  Z51.81          Plan:     Patient Instructions  ICD-10-CM   1. Chronic hypoxemic respiratory failure ([place])  J96.11 Ambulatory Referral for DME    2. IPF (idiopathic pulmonary fibrosis) ([place])  J84.112 Ambulatory Referral for DME    3. WHO group 3 pulmonary arterial hypertension (HCC)  I27.23     4. DOE (dyspnea on exertion)  R06.09     5. Medication monitoring encounter  Z51.81      You are having progressive shortness of breath and worsening hypoxemia Need to understand why but concerned about worsening IPF  Plan -Do CBC with  differential, chemistry and liver function test, BNP, D-dimer  today -rule out anemia, chf, blood clot  - do in [place] -Do echocardiogram -again to check if the heart pump is okay - can do in [place] - Do high-resolution CT scan of the chest supine and prone to see if the pulmonary fibrosis is worse  - can do in [place] -If possible do spirometry and DLCO next few weeks  - can do in [place]  -Blood work can be done in [place] while the rest of the test can be done in [place] -Take an order for  concentrator that can go up to 10 L nasal cannula - Glad you have energy back up at home  - keep pulse ox >= 88% at rest and >=84% with exertion    - avoid passing out  - skin cancer: treatment by dermatologist  Follow-up - Video visit 30 minutes with Dr. Chase Caller or nurse practitioner to discuss results  - next few weeks - consider short course steroids or discuss goals or other treatment based on results  ( Level 05 visit: estab40-54 min  in  visit type: on-site physical face to visit  in total care time and counseling or/and coordination of care by this undersigned MD - Dr Brand Males. This includes one or more of the following on this same day 03/21/2021: pre-charting, chart review, note writing, documentation discussion of test results, diagnostic or treatment recommendations, prognosis, risks and benefits of management options, instructions, education, compliance or risk-factor reduction. It excludes time spent by the [place] or office staff in the care of the patient. Actual time 45 min)   SIGNATURE    Dr. Brand Males, M.D., F.C.C.P,  Pulmonary and Critical Care Medicine Staff Physician, [place] Director - Interstitial Lung Disease  Program  P[facility] [place] [facility] [place], [place], [address]  Pager: [phone], If no answer or between  15:00h - 7:00h: call [phone] Telephone: [phone]  11:22 AM 03/21/2021

## 2021-03-22 ENCOUNTER — Ambulatory Visit (HOSPITAL_COMMUNITY)
Admission: RE | Admit: 2021-03-22 | Discharge: 2021-03-22 | Disposition: A | Discharge status: Home / Self Care | Payer: Medicare Other | Source: Ambulatory Visit | Attending: Internal Medicine | Admitting: Internal Medicine

## 2021-03-22 DIAGNOSIS — I2723 Pulmonary hypertension due to lung diseases and hypoxia: Secondary | ICD-10-CM | POA: Diagnosis present

## 2021-03-22 DIAGNOSIS — J84112 Idiopathic pulmonary fibrosis: Secondary | ICD-10-CM | POA: Diagnosis present

## 2021-03-22 DIAGNOSIS — R7989 Other specified abnormal findings of blood chemistry: Secondary | ICD-10-CM | POA: Diagnosis present

## 2021-03-22 DIAGNOSIS — Z5181 Encounter for therapeutic drug level monitoring: Secondary | ICD-10-CM | POA: Diagnosis present

## 2021-03-22 DIAGNOSIS — R079 Chest pain, unspecified: Secondary | ICD-10-CM | POA: Diagnosis not present

## 2021-03-22 DIAGNOSIS — R0609 Other forms of dyspnea: Secondary | ICD-10-CM

## 2021-03-22 DIAGNOSIS — M7989 Other specified soft tissue disorders: Secondary | ICD-10-CM | POA: Diagnosis present

## 2021-03-22 DIAGNOSIS — J9611 Chronic respiratory failure with hypoxia: Secondary | ICD-10-CM | POA: Insufficient documentation

## 2021-03-22 MED ORDER — IOHEXOL 350 MG/ML SOLN
75.0000 mL | Freq: Once | INTRAVENOUS | Status: AC | PRN
  Administered 2021-03-22: 75 mL via INTRAVENOUS

## 2021-03-22 NOTE — Telephone Encounter (Signed)
MR please advise on the results of the labs that the pt had yesterday.  His grandson is calling for these.  Thanks

## 2021-03-22 NOTE — Progress Notes (Signed)
Bilateral lower extremity venous duplex has been completed. Preliminary results can be found in CV Proc through chart review.  Results were given to Dr. Annamaria Boots.  03/22/21 2:37 PM Garrett Peterson RVT

## 2021-03-22 NOTE — Telephone Encounter (Signed)
Lm for Randall(dpr).

## 2021-03-22 NOTE — Telephone Encounter (Signed)
[name] grandson is returning phone call. Randall phone number is [phone].

## 2021-03-23 NOTE — Telephone Encounter (Signed)
Spoke with the pt's grandson and notified of results Notified of results Nothing further needed

## 2021-03-23 NOTE — Telephone Encounter (Signed)
Good news - no dvt/no PE Blood work normal though pro bnp slightly high - IPF +  and on CT similar to feb 2022  Plan  - finish echo and arrange visit to put things to gether but no need for er visit/admit based on results   LABS   Results for Garrett Peterson, Garrett Peterson (MRN [id number]) as of 03/23/2021 17:35  Ref. Range 09/21/2020 10:05 03/21/2021 11:30  Pro B Natriuretic peptide (BNP) Latest Ref Range: 0.0 - 100.0 pg/mL 116.0 (H) 141.0 (H)   PULMONARY No results for input(s): PHART, PCO2ART, PO2ART, HCO3, TCO2, O2SAT in the last 168 hours.  Invalid input(s): PCO2, PO2  CBC Recent Labs  Lab 03/21/21 1130  HGB 13.8  HCT 40.4  WBC 10.1  PLT 185.0    COAGULATION No results for input(s): INR in the last 168 hours.  CARDIAC  No results for input(s): TROPONINI in the last 168 hours. Recent Labs  Lab 03/21/21 1130  PROBNP 141.0*     CHEMISTRY Recent Labs  Lab 03/21/21 1130  NA 141  K 3.9  CL 98  CO2 36*  GLUCOSE 136*  BUN 10  CREATININE 1.08  CALCIUM 9.1   Estimated Creatinine Clearance: 68.6 mL/min (by C-G formula based on SCr of 1.08 mg/dL).   LIVER Recent Labs  Lab 03/21/21 1130  AST 27  ALT 27  ALKPHOS 116  BILITOT 0.5  PROT 7.4  ALBUMIN 4.1     INFECTIOUS No results for input(s): LATICACIDVEN, PROCALCITON in the last 168 hours.   ENDOCRINE CBG (last 3)  No results for input(s): GLUCAP in the last 72 hours.       IMAGING x48h  - image(s) personally visualized  -   highlighted in bold CT Angio Chest W/Cm &/Or Wo Cm  Result Date: 03/23/2021 CLINICAL DATA:  75 year old male with history of shortness of breath, chronic hypoxemic respiratory failure and elevated D-dimer. Evaluate for pulmonary embolism. EXAM: CT ANGIOGRAPHY CHEST WITH CONTRAST TECHNIQUE: Multidetector CT imaging of the chest was performed using the standard protocol during bolus administration of intravenous contrast. Multiplanar CT image reconstructions and MIPs were obtained to  evaluate the vascular anatomy. CONTRAST:  44m OMNIPAQUE IOHEXOL 350 MG/ML SOLN COMPARISON:  Chest CT 06/23/2020. FINDINGS: Cardiovascular: Today's study is limited by considerable patient respiratory motion. With these limitations in mind, there is no definite central, lobar or segmental sized filling defect in the pulmonary arterial tree to suggest clinically significant pulmonary embolism. Smaller distal subsegmental sized defects cannot be completely excluded secondary to respiratory motion. Heart size is mildly enlarged. There is no significant pericardial fluid, thickening or pericardial calcification. There is aortic atherosclerosis, as well as atherosclerosis of the great vessels of the mediastinum and the coronary arteries, including calcified atherosclerotic plaque in the left main, left anterior descending, left circumflex and right coronary arteries. Status post median sternotomy for CABG including LIMA to the LAD. Mediastinum/Nodes: No pathologically enlarged mediastinal or hilar lymph nodes. Esophagus is unremarkable in appearance. No axillary lymphadenopathy. Lungs/Pleura: Evaluation of the lung parenchyma is limited by extensive patient respiratory motion. With these limitations in mind, there are widespread areas of ground-glass attenuation, septal thickening, thickening of the peribronchovascular interstitium, cylindrical and mild varicose bronchiectasis scattered throughout the lungs bilaterally with a definitive craniocaudal gradient. No definitive honeycombing confidently identified. No pleural effusions. Upper Abdomen: Aortic atherosclerosis. Incompletely imaged exophytic lesion extending from the anterior aspect of the upper pole of the right kidney which is low-attenuation and measures at least 8.5 cm in diameter,  not characterized on today's examination, but likely to represent a large exophytic cyst. Status post cholecystectomy. Musculoskeletal: Median sternotomy wires. There are no  aggressive appearing lytic or blastic lesions noted in the visualized portions of the skeleton. Review of the MIP images confirms the above findings. IMPRESSION: 1. Limited examination demonstrating no evidence of clinically significant central, lobar or segmental sized pulmonary embolism to account for the patient's symptoms. 2. The appearance of the lungs is compatible with interstitial lung disease, as above. Please see report for contemporaneously obtained high-resolution chest CT for full description of these findings. 3. Mild cardiomegaly. 4. Aortic atherosclerosis, in addition to left main and 3 vessel coronary artery disease. Assessment for potential risk factor modification, dietary therapy or pharmacologic therapy may be warranted, if clinically indicated. Status post median sternotomy for CABG including LIMA to the LAD. 5. Additional incidental findings, as above. Aortic Atherosclerosis (ICD10-I70.0). Electronically Signed   By: Vinnie Langton M.D.   On: 03/23/2021 05:13   CT Chest High Resolution  Result Date: 03/23/2021 CLINICAL DATA:  75 year old male with history of interstitial lung disease. Chronic hypoxemic respiratory failure. EXAM: CT CHEST WITHOUT CONTRAST TECHNIQUE: Multidetector CT imaging of the chest was performed following the standard protocol without intravenous contrast. High resolution imaging of the lungs, as well as inspiratory and expiratory imaging, was performed. COMPARISON:  Multiple priors, most recently chest CT 06/23/2020. FINDINGS: Cardiovascular: Heart size is mildly enlarged. There is no significant pericardial fluid, thickening or pericardial calcification. There is aortic atherosclerosis, as well as atherosclerosis of the great vessels of the mediastinum and the coronary arteries, including calcified atherosclerotic plaque in the left main, left anterior descending, left circumflex and right coronary arteries. Status post median sternotomy for CABG including LIMA to  the LAD. Mild calcifications of the aortic valve. Right internal jugular single-lumen porta cath with tip terminating in the mid superior vena cava. Mediastinum/Nodes: No pathologically enlarged mediastinal or hilar lymph nodes. Please note that accurate exclusion of hilar adenopathy is limited on noncontrast CT scans. Esophagus is unremarkable in appearance. No axillary lymphadenopathy. Lungs/Pleura: High-resolution images again demonstrate widespread areas of ground-glass attenuation, septal thickening, cylindrical and varicose bronchiectasis, severe thickening of the peribronchovascular interstitium, regional architectural distortion and some parenchymal banding. No frank honeycombing is confidently identified. There is once again a definitive craniocaudal gradient. Inspiratory and expiratory is unremarkable. The findings appear essentially stable compared to prior study from 06/23/2020. Upper Abdomen: Aortic atherosclerosis. Status post cholecystectomy. Incompletely imaged exophytic low-attenuation lesion extending off the anterior aspect of the upper pole of the left kidney measuring at least 8.7 cm in diameter, incompletely characterized on today's non-contrast CT examination, but statistically likely to represent a large cyst. Musculoskeletal: Median sternotomy wires. There are no aggressive appearing lytic or blastic lesions noted in the visualized portions of the skeleton. IMPRESSION: 1. Severe chronic interstitial lung disease redemonstrated, as above. 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 However, given the stability of the findings compared to the prior study in the lack of frank honeycombing, an alternative etiology is difficult to exclude, particularly in light of the rapid development (very little disease was present on prior study from 09/06/2019). Repeat high-resolution chest CT is  recommended in 12 months to assess for temporal changes in the appearance of the lung parenchyma. 2. Mild cardiomegaly. 3. Aortic atherosclerosis, in addition to left main and 3 vessel coronary artery disease. Status post median sternotomy for CABG including LIMA  to the LAD. 4. There are calcifications of the aortic valve. Echocardiographic correlation for evaluation of potential valvular dysfunction may be warranted if clinically indicated. Aortic Atherosclerosis (ICD10-I70.0). Electronically Signed   By: Vinnie Langton M.D.   On: 03/23/2021 05:22   VAS [place] LOWER EXTREMITY VENOUS (DVT)  Result Date: 03/22/2021  Lower Venous DVT Study Patient Name:  Garrett Peterson  Date of Exam:   03/22/2021 Medical Rec #: [id number]        Accession #:    [id number] Date of Birth: [birth year]        Patient Gender: M Patient Age:   40 years Exam Location:  [facility] Procedure:      VAS [place] LOWER EXTREMITY VENOUS (DVT) Referring Phys: Leather Estis --------------------------------------------------------------------------------  Indications: Swelling.  Risk Factors: Cancer. Limitations: Body habitus and poor ultrasound/tissue interface. Comparison Study: No prior studies. Performing Technologist: Oliver Hum RVT  Examination Guidelines: A complete evaluation includes B-mode imaging, spectral Doppler, color Doppler, and power Doppler as needed of all accessible portions of each vessel. Bilateral testing is considered an integral part of a complete examination. Limited examinations for reoccurring indications may be performed as noted. The reflux portion of the exam is performed with the patient in reverse Trendelenburg.  +---------+---------------+---------+-----------+----------+--------------+ RIGHT    CompressibilityPhasicitySpontaneityPropertiesThrombus Aging +---------+---------------+---------+-----------+----------+--------------+ CFV      Full           Yes      Yes                                  +---------+---------------+---------+-----------+----------+--------------+ SFJ      Full                                                        +---------+---------------+---------+-----------+----------+--------------+ FV Prox  Full                                                        +---------+---------------+---------+-----------+----------+--------------+ FV Mid   Full                                                        +---------+---------------+---------+-----------+----------+--------------+ FV DistalFull                                                        +---------+---------------+---------+-----------+----------+--------------+ PFV      Full                                                        +---------+---------------+---------+-----------+----------+--------------+ POP      Full  Yes      Yes                                 +---------+---------------+---------+-----------+----------+--------------+ PTV      Full                                                        +---------+---------------+---------+-----------+----------+--------------+ PERO     Full                                                        +---------+---------------+---------+-----------+----------+--------------+   +---------+---------------+---------+-----------+----------+--------------+ LEFT     CompressibilityPhasicitySpontaneityPropertiesThrombus Aging +---------+---------------+---------+-----------+----------+--------------+ CFV      Full           Yes      Yes                                 +---------+---------------+---------+-----------+----------+--------------+ SFJ      Full                                                        +---------+---------------+---------+-----------+----------+--------------+ FV Prox  Full                                                         +---------+---------------+---------+-----------+----------+--------------+ FV Mid   Full                                                        +---------+---------------+---------+-----------+----------+--------------+ FV DistalFull                                                        +---------+---------------+---------+-----------+----------+--------------+ PFV      Full                                                        +---------+---------------+---------+-----------+----------+--------------+ POP      Full           Yes      Yes                                 +---------+---------------+---------+-----------+----------+--------------+ PTV  Full                                                        +---------+---------------+---------+-----------+----------+--------------+ PERO     Full                                                        +---------+---------------+---------+-----------+----------+--------------+    Summary: RIGHT: - There is no evidence of deep vein thrombosis in the lower extremity. However, portions of this examination were limited- see technologist comments above.  - No cystic structure found in the popliteal fossa.  LEFT: - There is no evidence of deep vein thrombosis in the lower extremity. However, portions of this examination were limited- see technologist comments above.  - No cystic structure found in the popliteal fossa.  *See table(s) above for measurements and observations.    Preliminary

## 2021-03-28 ENCOUNTER — Ambulatory Visit
Admission: RE | Admit: 2021-03-28 | Discharge: 2021-03-28 | Disposition: A | Discharge status: Home / Self Care | Payer: Medicare Other | Source: Ambulatory Visit | Attending: Internal Medicine | Admitting: Internal Medicine

## 2021-03-28 DIAGNOSIS — I2510 Atherosclerotic heart disease of native coronary artery without angina pectoris: Secondary | ICD-10-CM | POA: Insufficient documentation

## 2021-03-28 DIAGNOSIS — Z5181 Encounter for therapeutic drug level monitoring: Secondary | ICD-10-CM

## 2021-03-28 DIAGNOSIS — J849 Interstitial pulmonary disease, unspecified: Secondary | ICD-10-CM | POA: Diagnosis not present

## 2021-03-28 DIAGNOSIS — E785 Hyperlipidemia, unspecified: Secondary | ICD-10-CM | POA: Insufficient documentation

## 2021-03-28 DIAGNOSIS — I10 Essential (primary) hypertension: Secondary | ICD-10-CM | POA: Diagnosis not present

## 2021-03-28 DIAGNOSIS — Z951 Presence of aortocoronary bypass graft: Secondary | ICD-10-CM | POA: Diagnosis not present

## 2021-03-28 DIAGNOSIS — J9611 Chronic respiratory failure with hypoxia: Secondary | ICD-10-CM

## 2021-03-28 DIAGNOSIS — I2723 Pulmonary hypertension due to lung diseases and hypoxia: Secondary | ICD-10-CM | POA: Diagnosis not present

## 2021-03-28 DIAGNOSIS — R0609 Other forms of dyspnea: Secondary | ICD-10-CM

## 2021-03-28 DIAGNOSIS — J84112 Idiopathic pulmonary fibrosis: Secondary | ICD-10-CM

## 2021-03-28 LAB — ECHOCARDIOGRAM COMPLETE
AR max vel: 1.79 cm2
AV Area VTI: 1.85 cm2
AV Area mean vel: 1.8 cm2
AV Mean grad: 5 mmHg
AV Peak grad: 9.5 mmHg
Ao pk vel: 1.54 m/s
Area-P 1/2: 1.83 cm2
MV VTI: 2.3 cm2
S' Lateral: 3.6 cm

## 2021-03-28 NOTE — Progress Notes (Signed)
*  PRELIMINARY RESULTS* Echocardiogram 2D Echocardiogram has been performed.  Garrett Peterson Garrett Peterson 03/28/2021, 10:40 AM

## 2021-03-29 NOTE — Telephone Encounter (Signed)
Submitted signed provider form for Patient Assistance RENEWAL form to  [employer]  for [place] . Will update patient when we receive a response.  Fax# [phone] Phone# [phone]  [name], PharmD, MPH, BCPS Clinical Pharmacist (Rheumatology and Pulmonology)

## 2021-03-29 NOTE — Progress Notes (Signed)
Echo normal

## 2021-03-30 ENCOUNTER — Telehealth: Payer: Self-pay | Admitting: Internal Medicine

## 2021-03-30 NOTE — Telephone Encounter (Signed)
Please see 03/21/2021 phone note.  I left a message for patient on 03/22/2021.

## 2021-03-30 NOTE — Telephone Encounter (Signed)
Margie did you call the pt?  I dont see anything in his chart.

## 2021-04-20 NOTE — Telephone Encounter (Signed)
Called [employer] update on patient's [employer] PAP renewal for 2023. Rep states turnaround time is 24-48 hours. Confirmed office fax number  Phone: [phone]  [name], PharmD, MPH, BCPS Clinical Pharmacist (Rheumatology and Pulmonology)

## 2021-04-25 ENCOUNTER — Ambulatory Visit (INDEPENDENT_AMBULATORY_CARE_PROVIDER_SITE_OTHER): Payer: Medicare Other | Admitting: Internal Medicine

## 2021-04-25 ENCOUNTER — Other Ambulatory Visit: Payer: Self-pay

## 2021-04-25 ENCOUNTER — Encounter: Payer: Self-pay | Admitting: Internal Medicine

## 2021-04-25 VITALS — BP 126/62 | HR 58 | Temp 98.3°F | Ht 68.0 in | Wt 220.2 lb

## 2021-04-25 DIAGNOSIS — J9611 Chronic respiratory failure with hypoxia: Secondary | ICD-10-CM

## 2021-04-25 DIAGNOSIS — J84112 Idiopathic pulmonary fibrosis: Secondary | ICD-10-CM | POA: Diagnosis not present

## 2021-04-25 DIAGNOSIS — Z5181 Encounter for therapeutic drug level monitoring: Secondary | ICD-10-CM

## 2021-04-25 DIAGNOSIS — Z7189 Other specified counseling: Secondary | ICD-10-CM

## 2021-04-25 DIAGNOSIS — R0609 Other forms of dyspnea: Secondary | ICD-10-CM

## 2021-04-25 DIAGNOSIS — I2723 Pulmonary hypertension due to lung diseases and hypoxia: Secondary | ICD-10-CM | POA: Diagnosis not present

## 2021-04-25 LAB — PULMONARY FUNCTION TEST
DL/VA % pred: 91 %
DL/VA: 3.65 ml/min/mmHg/L
DLCO cor % pred: 33 %
DLCO cor: 7.79 ml/min/mmHg
DLCO unc % pred: 32 %
DLCO unc: 7.61 ml/min/mmHg
FEF 25-75 Pre: 3.54 L/sec
FEF2575-%Pred-Pre: 177 %
FEV1-%Pred-Pre: 49 %
FEV1-Pre: 1.36 L
FEV1FVC-%Pred-Pre: 124 %
FEV6-%Pred-Pre: 41 %
FEV6-Pre: 1.5 L
FEV6FVC-%Pred-Pre: 106 %
FVC-%Pred-Pre: 39 %
FVC-Pre: 1.51 L
Pre FEV1/FVC ratio: 90 %
Pre FEV6/FVC Ratio: 99 %

## 2021-04-25 NOTE — Progress Notes (Addendum)
Previous [facility] pulmonary encounter: 07/04/20- Dr. Chase Caller  Referred to the ILD center for comprehensive evaluation by Dr. Rodman Pickle primary pulmonologist 75 year old male, never smoked.  Significant for chronic hypoxic respiratory failure, coronary artery disease, hypertension.  Patient of Dr. Chase Caller. Started OFEV in April 2022.   Garrett Peterson 75 y.o. -very complicated story.  Details of the story is copied and pasted above.  Meeting him for the first time with his wife.  It appears that he was previously healthy as of April 2021.  Although CT scan of the chest at the time of my personal visualization showed presence of early ILD [CT abdomen 10 years prior in 2011 had no ILD].  He was diagnosed with anorectal cancer.  He was undergoing treatment at [facility].  Chemotherapy as above.  Then in the fall/winter 2020 when he started falling ill with respiratory illnesses.  This is all documented above.  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  CT scan of the chest by October 2021 started showing significant amount of groundglass opacities.  He only had ANA test, rheumatoid factor and angiotensin-converting enzyme.  This is all negative.  For serology.  Otherwise has not had any other serology.  He had hypersensitive pneumonitis panel this was negative.  This was at outside facility [facility] clinic pulmonology program.  He has had 2 hospitalizations the most recent one being in January.  It appears that he was treated with prednisone each time.  It is unclear to me as this to him and his wife if he has been on chronic prednisone but reading the notes it appears so.  Currently is just finished a taper starting at 60 mg/day in January and finished at 20 mg and stop.  He says since stopping yesterday or today he is beginning to feel a little bit worse.  Room air oxygen at rest was fine but when he desaturated after he walked more than 2 laps.  However he needed 5 L to correct.  Most recent  echocardiogram was normal.  Most recent lavage showed mostly neutrophils.  Again culture negative.  His most recent CT scan of the chest in February 2022 without contrast shows improvement in groundglass opacities but no fibrosis.  His serologies ANA negative and rheumatoid factor negative and angiotensin converting enzyme negative  Definitely earlier on in the year it was not consistent with UIP and suggested alternate diagnosis.  Unclear to me if he has probable UIP pattern at this point in time in February 2022.  We did not have time to do an extensive detailed ILD questionnaire.  It appears that his goals are to get better and also feel better at the same time control his cancer.  They are also wondering about prognosis.  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  He is currently looking into getting radiation locally.  Oncologist wants to do a colonoscopy.  She feels the recurrence is because all his chemo has been on hold because of respiratory issues.   SErology Dec 2021 - [name]  ANA Direct - [facility] Negative Negative   RA Latex Turbid. - [facility] 0.0 - 13.9 IU/mL <10.0   A.Fumigatus #1 Abs - [facility] Negative Negative  Micropolyspora faeni, IgG - [facility] Negative Negative  Thermoactinomyces vulgaris, IgG - [facility] Negative Negative  A. Pullulans Abs -  [facility] Negative Negative  Thermoact. Saccharii - [facility] Negative Negative  Pigeon Serum Abs - [facility] Negative Negative   Angio Convert Enzyme - [facility] 14 - 82 U/L 26   Walk tst 07/04/2020    94% - ra at rest -> walked 2 laps without desat and dropped to 86% in middle of 3rd lap. Then need 5L [place] to correct to walk 3 laps in office   CT chest 06/23/20   IMPRESSION: 1. 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 Fibrosis has progressed since 03/02/2020 chest CT with resolved consolidative opacities. 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 UIP is difficult to exclude but is less favored. Follow-up high-resolution chest CT suggested in 6-12 months. Findings are indeterminate for UIP per consensus guidelines: Diagnosis of Idiopathic Pulmonary Fibrosis: An Official ATS/ERS/JRS/ALAT Clinical Practice Guideline. [place], Iss 5, [phone], Jan 11 2017. 2. Stable mild cardiomegaly. 3. Aortic Atherosclerosis (ICD10-I70.0).     Electronically Signed   By: [name] M.D.   On: 06/24/2020 15:15      08/02/2020 Patient presents today for 1 month follow-up with spirometry/DLCO. Patient was discussed at ILD conference in March 2022, diagnosed with IPF. Recent exacerbation likely chemo related. Started on Ofev. Patient was referred for right heart cath and referred to pulmonary rehab.  He is currently on an extended prednisone taper. He started 35m prednisone dose today x 2 weeks. He is still taking Bactrim. Breathing has improved. He has not started antifibrotic medication yet, his wifes states that their insurance has improved OFEV medication.   PFT 08/02/2020 - FVC 1.61 (41%), FEV1 1.55 (55%), ratio 96, DLCOcor 12.27 (52%) 09/21/2020 Patient presents today for 1 month follow-up. Accompanied by his son. He stop pulmonary for a week or two since having bronchitis. Cough is better since taking mucinex. He is on 4L nasal cannula. He is having 6-7 loose stools a day. Diarrhea started when he went up to 2 tabs of Ofev. He has up coming hearth cath, date to be determined. Son reports some new leg swelling. Due for LFTs today.   HEart cth  10/03/20  Conclusions: Severe native coronary artery disease including 70% distal LMCA and mid LAD disease, sequential 100% ostial and 99% proximal LCx lesions, and chronic total occlusion of ostial RCA. Widely patent LIMA-LAD and SVG-rPDA. Patent SVG-OM2 with 40% stenosis in proximal/mid portion of SVG. Mildly elevated left heart filling pressure (LVEDP 20 mmHg, PCWP 23 mmHg). Moderately elevated right heart filling pressure (mean RAP 14 mmHg, LVEDP 20 mmHg). Moderate pulmonary hypertension (mean PAP 35 mmHg, PVR 2.6 WU). Mildly reduced cardiac output/index.   Recommendations: Aggressive secondary prevention of coronary artery disease. Initiate furosemide 40 mg PO daily for HFpEF.  BMP to be drawn when patient is seen for follow-up 10/13/2020. Ongoing management of pulmonary fibrosis and pulmonary hypertension per Dr. RChase Caller   CNelva Bush MD [facility]      OV 10/18/2020  Subjective:  Patient ID: Garrett Peterson male , DOB: 1May 18, 1947, age 75y.o. , MRN: [id number], [address] [id number] TAlbina Billet MD Patient Care Team: TAlbina Billet MD as PCP - General (Internal Medicine) End, CHarrell Gave MD as PCP - Cardiology (Cardiology) SClent Jacks RN as  Oncology Nurse Navigator Earlie Server, MD as Consulting Physician (Oncology)  This Provider for this visit: Treatment Team:  Attending Provider: Brand Males, MD  Type of visit: Telephone/Video Circumstance: COVID-19 national emergency Identification of patient LEDGER HEINDL with 1945-11-27 and MRN [id number] - 2 person identifier Risks: Risks, benefits, limitations of telephone visit explained. Patient understood and verbalized agreement to proceed Anyone else on call: just patient Patient location: [phone] This provider location: Provider home because provider is isolating due to covid   10/18/2020 -  followup IPF.   HPI RAYSHON ALBAUGH 76 y.o. - Off ofev since  09/21/20 due to diarrrhea. He called 09/28/20 and we told him to stop ofev.  But now he tells me 10/18/2020 that for 3 weeks he is taking 127m bid. No diarrhea other than going to BR frequently but is tolerabl and small amounts only. Respiration wise he is stable. Starts getting worse after lunch. Still on 4 LNC o2. No change Not on active cancer Rx. Has been cleared. Had [facility]- see ablve. Srated on new lasix and helped breathing somewhat. Is attending pulm rehab  Discussed Include in patients with ILD   - Right heart cath :  PVR > 3, PCWP </= 15, Pmap >/=  25 -  Patient needed to be able to walk 1012m 300 feet on a 6m22mlk test  - he does have exclusionary criteria - his PVR is < 3 and PCWP is  15 but he is interested in applying for tyvasos. Discussed side effect profile briefly       OV 12/19/2020  Subjective:  Patient ID: PhiJuliette Peterson , DOB: 1/2Jun 27, 1947age [age]o. , MRN: [id number]: [id number] TatAlbina BilletD Patient Care Team: TatAlbina BilletD as PCP - General (Internal Medicine) End, ChrHarrell GaveD as PCP - Cardiology (Cardiology) StaClent JacksN as Oncology Nurse Navigator Yu,Earlie ServerD as Consulting Physician (Oncology)  This Provider for this visit: Treatment Team:  Attending Provider: RamBrand MalesD    12/19/2020 -   Chief Complaint  Patient presents with   Follow-up    PFT performed today.  Pt states that he has had a lot of postnasal drainage in the mornings. Pt states his breathing is about the same since last visit. Pt is on Tyvaso and states that has been working well.   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 -presents for follow-up with his family member.  Overall he stable.  Symptom scores are stable.  He had pulmonary function test.?  Quality of respiratory maneuver but his FVC shows 4% decline compared to 6 months ago of 5 months ago.  DLCO is stable.  He himself feels stable.  He is on inhaled treprostinil.  He feels inhaled treprostinil since early July 2022 is improving his shortness of breath.  He is using it 4 times daily and each time 7 times.  Plan is to escalate to close to 12 times each time.  He is not doing nintedanib.  He does not want to do nintedanib.  He rechallenged himself again for the second time and had side effects.  He feels is  because his GI motility is different after his anorectal cancer.  He wants to know if he can try any natural treatment.  He is particularly interested in pine pollen.  I told him that I do not know much about this medication but would be open to him trying it.  He also wanted to know what to do about sinus drainage which is the early in the morning for a few hours and is having to sneeze a lot.  His family member asked if he should turn down the humidifier which I said was okay.  Can also try saline nasal spray to clear it.  They wanted know about oxygen use.  He is using 4 L.  I advised him to use it continuously.  Also advised to adjusted for pulse ox goal greater than 88% with exertion.     OV 02/02/2021  Subjective:  Patient ID: Garrett Peterson, male , DOB: 12/15/1945 , age 40 y.o. , MRN: [id number] , ADDRESS: [place] [id number] PCP Albina Billet, MD Patient Care Team: Albina Billet, MD as PCP - General (Internal Medicine) End, Harrell Gave, MD as PCP - Cardiology (Cardiology) Clent Jacks, RN as Oncology Nurse Navigator Earlie Server, MD as Consulting Physician (Oncology)  This Provider for this visit: Treatment Team:  Attending Provider: Brand Males, MD   Follow-up IPF [exacerbated by radiation therapy  for anal rectal cancer )  -Intolerant to nintedanib spring 2022  -Diagnosis on multidisciplinary case conference 2022  - start esbriet Aug 2022 WHO group 3 pulmonary hypertension -started Tyvaso early November 11, 2020 Chronic hypoxemic respiratory failure  -4 L due to the above   02/02/2021 -   Chief Complaint  Patient presents with   Follow-up    Pt states he is about the same since last visit. Still becomes SOB with exertion.     HPI Garrett Peterson 75 y.o. -presents for follow-up.  He presents with [name] his grandson.  He has completed pulmonary rehabilitation.  They are reporting still 4 L of oxygen use at rest and 5 L with exertion.  He is now on pirfenidone.  He tried to escalate to 3 pills 3 times daily but this caused GI issues and low appetite.  So he is just taking 2 pills 3 times daily.  His grandson states that at pulmonary rehab and 3 L he did desaturate to 83%.  At home on 4 L he will desaturate into the 80s.  But at 5 L he seems okay.  They want a portable oxygen system.  He says that the restricted lifestyle from pulmonary fibrosis is making him more anxious and depressed.  In fact his anxiety and depression scores have gone up.  He feels portable oxygen will help him.  He wants portable oxygen.  I offered mental health counseling but he wants to try portable oxygen first.  He is willing to talk to the support group leader.  He has questions about life expectancy which I told him and the natural history would be few to several years.  The hope is that these antifibrotic's and keep him stable to new medications, long.  Also expressed uncertainty with the course.  He processed all this.  He is frustrated by his disease.  He again had questions about the goal of the inhaled treprostinil which I explained.  At this point in time he is accepted using the oxygen but wants portable.  He will use inhaled  treprostinil and also the pirfenidone.  He will check liver function test today.  Were  not sure of the rehabilitation helped him.  His main purpose right now is to be able to get out of the house and have improvements in his anxiety and depression.  We discussed clinical trials is not eligible because of his cancer history.       OV 03/21/2021  Subjective:  Patient ID: Garrett Peterson, male , DOB: 09/02/1945 , age 59 y.o. , MRN: [id number] , ADDRESS: [place] [id number] PCP Albina Billet, MD Patient Care Team: Albina Billet, MD as PCP - General (Internal Medicine) End, Harrell Gave, MD as PCP - Cardiology (Cardiology) Clent Jacks, RN as Oncology Nurse Navigator Earlie Server, MD as Consulting Physician (Oncology)  This Provider for this visit: Treatment Team:  Attending Provider: Brand Males, MD    03/21/2021 -   Chief Complaint  Patient presents with   Follow-up    Pt states his breathing has become worse since last visit.    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 -returns for follow-up with his grandson [name].  He tells me for the last 10 days or so starting around 03/09/2021 he started noticing increasing shortness of breath with exertion.  He uses 6 L and sometimes evenw with that with ex he drops to 70s  He does ADLs.  He says it takes around 25 minutes for him to take a shower and change his clothes.  4 to 6 weeks ago it used to take 18 minutes or 20 minutes.  Definitely takes longer.  He finds it slower to go to the mailbox and come back but nevertheless he is able to do all his  ADLs.He is tolerating time also quite well taking at all times each time 4 times daily.  No side effects.  He is tolerating pirfenidone at 2 pills 3 times daily.  His latest CT scan of the chest was in February 2022.  His last pulmonary function test was in August 2022.  Noted below that his symptoms were significantly worse.  We did a walking desaturation test and shows even on 8L 150 feet x 3 laps he desaturates   He did have a right-sided neck sub auricular area -skin biopsy on 03/05/2021.  I reviewed the result.  It shows squamous cell carcinoma.  He is waiting a call back from the dermatologist/surgeon.  But he and his grandson seem aware of the result.  He does have energy back up athome    Last CT FEb 2022 with progression of ILd/IPF since Oct 2021 Last PFT aug 2022 with progresion since march 2022 Cardiac cath May 2022 shows slightly reduced EF [January 2022 echocardiogram EF 66%] Not on anticoagulation No worsening edema Last creatinine 0.9 mg percent June 2022 Normal liver function test September 2022 Normal hemoglobin May 2022 last checked on     PFT  OV 04/25/2021  Subjective:  Patient ID: Garrett Peterson, male , DOB: Nov 26, 1945 ,  age 40 y.o. , MRN: [id number] , ADDRESS: [place] [id number] PCP Albina Billet, MD Patient Care Team: Albina Billet, MD as PCP - General (Internal Medicine) End, Harrell Gave, MD as PCP - Cardiology (Cardiology) Clent Jacks, RN as Oncology Nurse Navigator Earlie Server, MD as Consulting Physician (Oncology)  This Provider for this visit: Treatment Team:  Attending Provider: Brand Males, MD    04/25/2021 -   Chief Complaint  Patient presents with   Follow-up    PFT performed today.  Pt still has complaints of worsening SOB.     HPI Garrett Peterson 75 y.o. -presents with his grandson Donato Schultz to discuss test results. PFT since earlier in the year : 36% decline in DLCO, 6% declne In FVC - avg 21%  decline in early 2022 to current. BUt CT chest deemed stable through 2022 though new onset and definitely worse since 2021. Blood work ok. ECHO - ok. No new issues since last visit. No worsening of symptoms. Last visit adised hm to use 4L [place] rest but increaes to avoid dessaturaion with exertion but he and grandson tell me he is not titrating up. ADvised this could be a safety issue  Conversation then followed on goals of care and expectations     SYMPTOM SCALE - ILD 07/04/2020  08/02/2020  12/19/2020 Tyvaso since early July 2022 Off ofev 02/02/2021 Tyvasos Esbiret 2 pills tid 03/21/2021 Tryvaso 12 times qid, and esbriet 2  pills tid 04/25/2021 Tyvaso, esbriet and o2  O2 use 4L  4L 4L 4L rest, 5L ex 4L rest, 6L ex but is needed 8-10 when tested   Shortness of Breath 0 -> 5 scale with 5 being worst (score 6 If unable to do)       At rest 1 0 5 2._0 Simple tasks - showers, clothes change, eating, shaving 4 3 3._1 Household (dishes, doing bed, laundry) x x x x 4 6  Shopping x x x x 4 6  Walking level at own pace 3 3 x x (3.5) 3.5  Walking up Stairs _2 Total (30-36) Dyspnea Score 12 10 13.5 11.5 19 21.5  How bad is your cough? 0 0 2  0 0  How bad is your fatigue _3 How bad is nausea 0 0 0 0 0 0  How bad is vomiting?  0 0 0 0 0 0  How bad is diarrhea? 0 0 1 0 0 0  How bad is anxiety? _4 How bad is depression 5 3 2._5 Simple office walk 185 feet x  3 laps goal with forehead probe 03/21/2021  03/21/2021   O2 used 4L 8L  Number laps completed 1 lap only 3 laps  Comments about pace avvg pace   Resting Pulse Ox/HR 100% and 59/min   Final Pulse Ox/HR 86% and 81/min 84% at end of 3rd lap on 8L  Desaturated </= 88% yes   Desaturated <= 3% points yes   Got Tachycardic >/= 90/min no   Symptoms at end of test dyspnea   Miscellaneous comments worse      PFT  PFT Results Latest Ref Rng & Units 04/25/2021 12/19/2020 08/02/2020  FVC-Pre L 1.51  1.54 1.61  FVC-Predicted Pre % 39 39 41  Pre FEV1/FVC % % 90 85 96  FEV1-Pre L 1.36 1.31 1.55  FEV1-Predicted Pre % 49 47 55  DLCO uncorrected ml/min/mmHg 7.61 13.12 12.41  DLCO UNC% % 32 55 52  DLCO corrected ml/min/mmHg 7.79 13.12 12.27  DLCO COR %Predicted % 33 55 52  DLVA Predicted % 91 91 103     HRCT NOV 2022 CLINICAL DATA:  75 year old male with history of interstitial lung disease. Chronic hypoxemic respiratory failure.   EXAM: CT CHEST WITHOUT CONTRAST   TECHNIQUE: Multidetector CT imaging of the chest was performed following the standard protocol without intravenous contrast. High resolution imaging of the lungs, as well as inspiratory and expiratory imaging, was performed.   COMPARISON:  Multiple priors, most recently chest CT 06/23/2020.   FINDINGS: Cardiovascular: Heart size is mildly enlarged. There is no significant pericardial fluid, thickening or pericardial calcification. There is aortic atherosclerosis, as well as atherosclerosis of the great vessels of the mediastinum and the coronary arteries, including calcified atherosclerotic plaque in the left main, left anterior descending, left circumflex and right coronary arteries. Status post median sternotomy for CABG including LIMA to the LAD. Mild calcifications of the aortic valve. Right internal jugular single-lumen porta cath with tip terminating in the mid superior vena cava.   Mediastinum/Nodes: No pathologically enlarged mediastinal or hilar lymph nodes. Please note that accurate exclusion of hilar adenopathy is limited on noncontrast CT scans. Esophagus is unremarkable in appearance. No axillary lymphadenopathy.   Lungs/Pleura: High-resolution images again demonstrate widespread areas of ground-glass attenuation, septal thickening, cylindrical and varicose bronchiectasis, severe thickening of the peribronchovascular interstitium, regional architectural distortion and some parenchymal banding. No  frank honeycombing is confidently identified. There is once again a definitive craniocaudal gradient. Inspiratory and expiratory is unremarkable. The findings appear essentially stable compared to prior study from 06/23/2020.   Upper Abdomen: Aortic atherosclerosis. Status post cholecystectomy. Incompletely imaged exophytic low-attenuation lesion extending off the anterior aspect of the upper pole of the left kidney measuring at least 8.7 cm in diameter, incompletely characterized on today's non-contrast CT examination, but statistically likely to represent a large cyst.   Musculoskeletal: Median sternotomy wires. There are no aggressive appearing lytic or blastic lesions noted in the visualized portions of the skeleton.   IMPRESSION: 1. Severe chronic interstitial lung disease redemonstrated, as above. 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 However, given the stability of the findings compared to the prior study in the lack of frank honeycombing, an alternative etiology is difficult to exclude, particularly in light of the rapid development (very little disease was present on prior study from 09/06/2019). Repeat high-resolution chest CT is recommended in 12 months to assess for temporal changes in the appearance of the lung parenchyma. 2. Mild cardiomegaly. 3. Aortic atherosclerosis, in addition to left main and 3 vessel coronary artery disease. Status post median sternotomy for CABG including LIMA to the LAD. 4. There are calcifications of the aortic valve. Echocardiographic correlation for evaluation of potential valvular dysfunction may be warranted if clinically indicated.   Aortic Atherosclerosis (ICD10-I70.0).     Electronically Signed   By: Vinnie Langton M.D.   On: 03/23/2021 05:22    Xxxx  ECHO 03/28/21  IMPRESSIONS     1. Left ventricular  ejection fraction, by estimation, is 50 to 55%. The  left ventricle has low normal function. The left ventricle has no regional  wall motion abnormalities. Left ventricular diastolic parameters were  normal. The average left ventricular   global  longitudinal strain is -18.2 %. The global longitudinal strain is  normal.   2. Right ventricular systolic function is low normal. The right  ventricular size is normal.   3. The mitral valve is normal in structure. No evidence of mitral valve  regurgitation.   4. The aortic valve was not well visualized. Aortic valve regurgitation  is not visualized.   5. The inferior vena cava is normal in size with greater than 50%  respiratory variability, suggesting right atrial pressure of 3 mmHg.    Xxxx  BLood  Latest Reference Range & Units 09/20/11 11:04 09/06/19 12:00 09/21/19 06:26 10/27/19 08:21 11/02/19 08:00 11/09/19 10:09 11/17/19 08:12 12/01/19 08:14 12/08/19 08:32 12/15/19 08:12 12/29/19 08:10 01/05/20 08:09 01/19/20 08:15 02/02/20 08:17 02/23/20 08:13 03/03/20 09:30 03/04/20 06:11 03/20/20 08:45 05/15/20 12:02 05/16/20 10:18 05/17/20 00:25 05/18/20 05:00 06/14/20 11:46 08/15/20 08:58 09/27/20 12:59 10/13/20 10:43 03/21/21 11:30  Creatinine 0.40 - 1.50 mg/dL 0.99 1.10 1.17 0.90 0.83 0.96 0.92 0.93 0.91 0.89 0.87 0.88 0.78 0.94 0.84 0.89 0.94 0.84 1.10 1.12 1.11 1.08 1.20 0.97 0.77 0.93 1.08    Latest Reference Range & Units 09/06/19 12:00 09/21/19 06:26 10/18/19 11:00 11/02/19 08:00 11/09/19 10:09 11/17/19 08:12 11/24/19 11:03 12/01/19 08:14 12/08/19 08:32 12/15/19 08:12 12/29/19 08:10 01/05/20 08:09 01/19/20 08:15 02/02/20 08:17 02/09/20 10:52 02/23/20 08:13 03/03/20 09:30 03/04/20 06:11 03/20/20 08:45 04/20/20 09:33 05/16/20 10:18 05/17/20 00:25 05/18/20 05:00 06/14/20 11:46 09/21/20 10:05 09/27/20 12:59 03/21/21 11:30  Hemoglobin 13.0 - 17.0 g/dL 14.9 12.6 (L) 13.5 12.5 (L) 12.8 (L) 13.2 12.6 (L) 13.1 12.8 (L) 13.5 12.7 (L) 13.1 13.9 14.4 14.1 14.6  15.6 14.1 14.3 15.3 15.0 15.1 15.0 15.0 13.5 14.2 13.8  (L): Data is abnormally low  has a past medical history of (HFpEF) heart failure with preserved ejection fraction ([place]), Actinic keratosis, Adenomatous polyp of colon, Arthritis, Basal cell carcinoma, CAD (coronary artery disease), Cancer ([place]) (2021), Hyperlipidemia, Hypertension, Interstitial lung disease ([place]), Pneumonia, Pre-diabetes, Respiratory failure with hypoxia ([place]), and 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   reports that he has never smoked. He has quit using smokeless tobacco.  His smokeless tobacco use included chew.  Past Surgical History:  Procedure Laterality Date   APPENDECTOMY     BRONCHIAL WASHINGS  05/18/2020   Procedure: BRONCHIAL WASHINGS;  Surgeon: Margaretha Seeds, MD;  Location: [facility];  Service: Pulmonary;;   CARDIAC CATHETERIZATION     PT DENIES   COLONOSCOPY     2011,2015,2021   COLONOSCOPY WITH PROPOFOL N/A 07/10/2020   Procedure: COLONOSCOPY WITH PROPOFOL;  Surgeon: Toledo, Benay Pike, MD;  Location: [facility];  Service: Gastroenterology;  Laterality: N/A;   CORONARY ARTERY BYPASS GRAFT     FLEXIBLE BRONCHOSCOPY N/A 04/24/2020   Procedure: FLEXIBLE BRONCHOSCOPY;  Surgeon: Ottie Glazier, MD;  Location: [facility];  Service: Thoracic;  Laterality: N/A;   IR SINUS/FIST TUBE CHK-NON GI  10/27/2019   open heart surgery     PORTACATH PLACEMENT Right 10/13/2019   Procedure: INSERTION PORT-A-CATH;  Surgeon: Herbert Pun, MD;  Location: [facility];  Service: General;  Laterality: Right;   RIGHT/LEFT HEART CATH AND CORONARY ANGIOGRAPHY N/A 10/03/2020   Procedure: RIGHT/LEFT HEART CATH AND CORONARY ANGIOGRAPHY;  Surgeon: Nelva Bush, MD;  Location: [facility];  Service: Cardiovascular;  Laterality: N/A;   ROBOT ASSISTED LAPAROSCOPIC PARTIAL COLECTOMY  09/2019   VIDEO BRONCHOSCOPY N/A 05/18/2020   Procedure: VIDEO BRONCHOSCOPY WITHOUT FLUORO;  Surgeon: Margaretha Seeds, MD;   Location: [facility];  Service: Pulmonary;  Laterality: N/A;    Allergies  Allergen Reactions   Ofev [Nintedanib] Diarrhea and Nausea Only    Immunization History  Administered Date(s) Administered   Influenza, High Dose Seasonal PF 03/19/2021   PFIZER(Purple Top)SARS-COV-2 Vaccination 06/09/2019, 06/30/2019, 01/18/2020   Zoster Recombinat (Shingrix) 01/12/2018, 03/21/2018    Family History  Problem Relation Age of Onset   Lung disease Father      Current Outpatient Medications:    aspirin EC 81 MG tablet, Take 1 tablet (81 mg total) by mouth daily. Swallow whole., Disp: , Rfl:    atorvastatin (LIPITOR) 20 MG tablet, Take 20 mg by mouth daily., Disp: , Rfl:    furosemide (LASIX) 40 MG tablet, TAKE 1 TABLET BY MOUTH ONCE DAILY, Disp: 30 tablet, Rfl: 5   metoprolol tartrate (LOPRESSOR) 25 MG tablet, Take 25 mg by mouth 2 (two) times daily., Disp: , Rfl:    Pirfenidone (ESBRIET) 267 MG TABS, Take 2 tablets (534 mg total) by mouth in the morning, at noon, and at bedtime., Disp: 540 tablet, Rfl: 1   predniSONE (DELTASONE) 5 MG tablet, TAKE 1 TABLET BY MOUTH DAILY WITH BREAKFAST, Disp: 30 tablet, Rfl: 2   temazepam (RESTORIL) 30 MG capsule, Take 30 mg by mouth at bedtime. , Disp: , Rfl:    Treprostinil (TYVASO) 0.6 MG/ML SOLN, Inhale into the lungs 4 (four) times daily., Disp: , Rfl:       Objective:   Vitals:   04/25/21 1011  BP: 126/62  Pulse: (!) 58  Temp: 98.3 F (36.8 C)  TempSrc: Oral  SpO2: 98%  Weight: 220 lb 3.2 oz (99.9 kg)  Height: _0  (1.727 m)    Estimated body mass index is 33.48 kg/m as calculated from the following:   Height as of this encounter: _1  (1.727 m).   Weight as of this encounter: 220 lb 3.2 oz (99.9 kg).  _2 @  Filed Weights   04/25/21 1011  Weight: 220 lb 3.2 oz (99.9 kg)     Physical Exam General: No distress. Sitting in wheel chair Neuro: Alert and Oriented x 3. GCS 15. Speech normal Psych: Pleasant Resp:  Barrel  Chest - no.  Wheeze - no, Crackles - yes, No overt respiratory distress CVS: Normal heart sounds. Murmurs - no Ext: Stigmata of Connective Tissue Disease - no HEENT: Normal upper airway. PEERL +. No post nasal drip FLAT AFFECT        Assessment:       ICD-10-CM   1. Chronic hypoxemic respiratory failure (HCC)  J96.11 Amb Referral to Palliative Care    2. IPF (idiopathic pulmonary fibrosis) (HCC)  J84.112 Amb Referral to Palliative Care    3. WHO group 3 pulmonary arterial hypertension (HCC)  I27.23     4. DOE (dyspnea on exertion)  R06.09     5. Medication monitoring encounter  Z51.81     6. Goals of care, counseling/discussion  Z71.89          Plan:     Patient Instructions     ICD-10-CM   1. Chronic hypoxemic respiratory failure (HCC)  J96.11     2. IPF (idiopathic pulmonary fibrosis) ([place])  J84.112     3. WHO group 3 pulmonary arterial hypertension (HCC)  I27.23     4. DOE (dyspnea on exertion)  R06.09     5. Medication monitoring encounter  Z51.81     6. Goals of care, counseling/discussion  Z71.89        ILD indicators  Latest Ref Rng & Units 04/25/2021 12/19/2020 08/02/2020  FVC-Pre L 1.51 1.54 1.61  FVC-Predicted Pre % 39 39 41  DLCO uncorrected ml/min/mmHg 7.61 13.12 12.41  DLCO UNC %Pred % 32 55 52  DLCO Corrected ml/min/mmHg 7.79 13.12 12.27  DLCO COR %Pred % 33 55 52    You are having progressive Pulmonary Fibrosis - susbstantial decline in lung function over time   I think without esbriet and tyvaso you might have been worse but also despite it you are getting worse  Plan - continue esbriet and tyvaso - Continuie o2 4L rest and increase upto to 10 L nasal cannula   - keep pulse ox >= 88% at rest and >=84% with exertion    - avoid passing out =- apply to [employer] for emergency priority  - refer home palliative care for quality of life enahancement, goals of care and symptom support - avoid getting respiratory viruses through masking, and  avoiding indoor clusters   - ensure you have living will regarding your health care  - recommend no CPR, no intubation - continue to [facility]  Follow-up - Video visit 30 minutes with Dr. Chase Caller - feb 2023    SIGNATURE    Dr. Brand Males, M.D., F.C.C.P,  Pulmonary and Critical Care Medicine Staff Physician, [facility] Director - Interstitial Lung Disease  Program  Pulmonary [place] [facility] [place], [place], [address]  Pager: [phone], If no answer or between  15:00h - 7:00h: call [phone] Telephone: [phone]  12:38 PM 04/25/2021

## 2021-04-25 NOTE — Progress Notes (Signed)
Spirometry and Dlco done today.

## 2021-04-25 NOTE — Patient Instructions (Addendum)
ICD-10-CM   1. Chronic hypoxemic respiratory failure (HCC)  J96.11     2. IPF (idiopathic pulmonary fibrosis) ([place])  J84.112     3. WHO group 3 pulmonary arterial hypertension (HCC)  I27.23     4. DOE (dyspnea on exertion)  R06.09     5. Medication monitoring encounter  Z51.81     6. Goals of care, counseling/discussion  Z71.89        ILD indicators Latest Ref Rng & Units 04/25/2021 12/19/2020 08/02/2020  FVC-Pre L 1.51 1.54 1.61  FVC-Predicted Pre % 39 39 41  DLCO uncorrected ml/min/mmHg 7.61 13.12 12.41  DLCO UNC %Pred % 32 55 52  DLCO Corrected ml/min/mmHg 7.79 13.12 12.27  DLCO COR %Pred % 33 55 52    You are having progressive Pulmonary Fibrosis - susbstantial decline in lung function over time   I think without esbriet and tyvaso you might have been worse but also despite it you are getting worse  Plan - continue esbriet and tyvaso - Continuie o2 4L rest and increase upto to 10 L nasal cannula   - keep pulse ox >= 88% at rest and >=84% with exertion    - avoid passing out =- apply to [employer] for emergency priority  - refer home palliative care for quality of life enahancement, goals of care and symptom support - avoid getting respiratory viruses through masking, and avoiding indoor clusters   - ensure you have living will regarding your health care  - recommend no CPR, no intubation - continue to [facility]  Follow-up - Video visit 30 minutes with Dr. Chase Caller - feb 2023

## 2021-04-27 ENCOUNTER — Telehealth: Payer: Self-pay | Admitting: Nurse Practitioner

## 2021-04-27 ENCOUNTER — Telehealth: Payer: Self-pay | Admitting: Internal Medicine

## 2021-04-27 MED ORDER — PREDNISONE 10 MG PO TABS: 10.0000 mg | ORAL_TABLET | Freq: Every day | ORAL | 0 refills | Status: DC

## 2021-04-27 MED ORDER — PREDNISONE 5 MG PO TABS: 5.0000 mg | ORAL_TABLET | Freq: Every day | ORAL | 2 refills | Status: DC

## 2021-04-27 NOTE — Telephone Encounter (Signed)
Spoke with patient regarding the Palliative referral/services and all questions were answered and he was in agreement with scheduling visit.  Patient requested visit after the first of the year.  Palliative Consult was scheduled for 05/23/21 @ 9 AM.

## 2021-04-27 NOTE — Telephone Encounter (Signed)
Called and spoke with patient's wife, [place] (DPR) regarding the prednisone, she states he had been taking 5 mg daily.  He has been out for several days, they have been trying to get it refilled.  I advised her that according to the last office note, he is to take 10 mg daily for 14 days and I would send that in electronically to [facility] (verified with wife).  She verbalized understanding.  Nothing further needed.   I also sent in a refill for the 5 mg to continue daily after he completes the 10 mg for 14 days with 2 refills.

## 2021-04-27 NOTE — Addendum Note (Signed)
Addended by: Vanessa Barbara on: 04/27/2021 05:25 PM   Modules accepted: Orders

## 2021-05-08 NOTE — Telephone Encounter (Signed)
Received a fax from  [facility]  regarding an approval for TYVASO DPI patient assistance from 04/23/21 to 05/12/2022.   Phone number: [phone]   [name], PharmD, MPH, BCPS Clinical Pharmacist (Rheumatology and Pulmonology)

## 2021-05-15 ENCOUNTER — Other Ambulatory Visit: Payer: Self-pay | Admitting: *Deleted

## 2021-05-15 DIAGNOSIS — J84112 Idiopathic pulmonary fibrosis: Secondary | ICD-10-CM

## 2021-05-15 MED ORDER — PIRFENIDONE 267 MG PO TABS: 534.0000 mg | ORAL_TABLET | Freq: Three times a day (TID) | ORAL | 3 refills | Status: DC

## 2021-05-17 ENCOUNTER — Telehealth: Payer: Self-pay | Admitting: Internal Medicine

## 2021-05-17 NOTE — Telephone Encounter (Signed)
Per med list, refills had been sent to [facility] on 05/15/21. Called pt and notified. Nothing further needed.

## 2021-05-23 ENCOUNTER — Encounter: Payer: Self-pay | Admitting: Oncology

## 2021-05-23 ENCOUNTER — Other Ambulatory Visit: Payer: Self-pay

## 2021-05-23 ENCOUNTER — Encounter: Payer: Self-pay | Admitting: Nurse Practitioner

## 2021-05-23 ENCOUNTER — Other Ambulatory Visit: Payer: Medicare Other | Admitting: Nurse Practitioner

## 2021-05-23 DIAGNOSIS — R0602 Shortness of breath: Secondary | ICD-10-CM

## 2021-05-23 DIAGNOSIS — Z515 Encounter for palliative care: Secondary | ICD-10-CM

## 2021-05-23 DIAGNOSIS — F339 Major depressive disorder, recurrent, unspecified: Secondary | ICD-10-CM

## 2021-05-23 NOTE — Progress Notes (Signed)
[facility] Palliative Care Consult Note Telephone: [phone]  Fax: [phone]   Date of encounter: 05/23/21 3:55 PM PATIENT NAME: Garrett Peterson [id number]   [phone] (home)  DOB: November 17, 1945 MRN: [id number] PRIMARY CARE PROVIDER:    Albina Billet, MD,  [address]   [place] [address] [phone]  REFERRING PROVIDER:   Albina Billet, MD [address]   [place],  [place] [address] [phone]  RESPONSIBLE PARTY:    Contact Information     Name Relation Home Work [place] [phone]  [phone]   GERMAINE, SHENKER [phone]      I met face to face with patient in home. Palliative Care was asked to follow this patient by consultation request of  Albina Billet, MD to address advance care planning and complex medical decision making. This is the initial visit.                               ASSESSMENT AND PLAN / RECOMMENDATIONS:   Symptom Management/Plan: 1. Advance Care Planning; Discussed and wishes are to be DNR, [place]rod form completed. Reviewed MOST form, completed with DNR, no intubation, no ventilator, limited interventions, wishes are for IVF and antibiotics, no feeding tube. Will put in vynca.   2. Shortness of breath secondary ILD to continue to monitor o2 sats, continuous O2, Garrett Peterson does titrate with increase sob, then decreases when improved. Continue inhalation therapy, Pulmonology visits. Recommended zyrtec 33m po daily for increase in PND, though Garrett GPaddockwanted to further discuss with Pulmonology  3. 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 Discussed at length, denies SI. Garrett. GSchwagerdid talk at length about his father's passing, We talked at length about chronic disease progression, realistic expectations, functional decline with limited abilities for performance with tasks as  he once did. We talked about quality of life. We talked about option of counseling, Garrett. GGruenewalddeclined at present time. We talked about grieving process with coping strategies. We talked about option of antidepressant. Garrett. GManderendorses he will consider it if Pulmonology feels would be beneficial, will further discuss with Pulmonology.   4. Goals of Care: Goals include to maximize quality of life and symptom management. Our advance care planning conversation included a discussion about:    The value and importance of advance care planning  Exploration of personal, cultural or spiritual beliefs that might influence medical decisions  Exploration of goals of care in the event of a sudden injury or illness  Identification and preparation of a healthcare agent  Review and updating or creation of an advance directive document.  5. Palliative care encounter; Palliative care encounter; Palliative medicine team will continue to support patient, patient's family, and medical team. Visit consisted of counseling and education dealing with the complex and emotionally intense issues of symptom management and palliative care in the setting of serious and potentially life-threatening illness  6. f/u 1 month for ongoing monitoring chronic disease progression, ongoing discussions complex medical decision making  Follow up Palliative Care Visit: Palliative care will continue to follow for complex medical decision making, advance care planning, and clarification of goals. Will have PC RN f/u 4 weeks, then PC NP in 8  weeks or prn.  I spent 73 minutes providing this consultation. More than 50% of the time in this consultation  was spent in counseling and care coordination.  This visit was based on medical complexity  PPS: 50%  Chief Complaint: Initial consult for Palliative care for complex medical decision making  HISTORY OF PRESENT ILLNESS:  CHANCELER PULLIN is a 76 y.o. year old male  with complex medical  problems including ILD, chronic hypoxic respiratory failure, O2 dependent, h/o anorectal cancer, severe CAD, moderate pulmonary HTN, I called Garrett Peterson to confirm pc initial visit, Garrett Peterson in agreement. We talked about purpose of pc visit. We talked about life review, he is married with step children, grandchildren. Garrett Peterson talked about past medical history, last visit with Pulmonary, ros, symptoms, chronic disease with progression. We talked about his functional abilities prior ILD and following. Garrett Peterson endorses he gets upset that he is not able to do the activities he previously been able to do. Garrett Peterson gave up driving. Garrett Peterson endorses he walks about 5 feet, then has to rest, turn up his O2. Garrett Peterson talked about quality of life, medical goals. We talked about family dynamics. Garrett Peterson talked about his father who also had respiratory problems. We talked about his mother and her passing. Garrett Peterson and I talked about sadness, depression, grieving, coping strategies. Garrett Peterson denies SI. We talked about option of counseling. Garrett Peterson declines, endorses he "talks to God who helps him". We talked about antidepressant, he was open to the idea but wanted to further discuss with his Pulmonologist. We talked about role pc in poc. We talked about f/u pc visit. Scheduled. Therapeutic listening, emotional support provided. Questions answered.  History obtained from review of EMR, discussion with  Garrett. [place].  I reviewed available labs, medications, imaging, studies and related documents from the EMR.  Records reviewed and summarized above.   ROS 10 point system reviewed all negative except HPI  Physical Exam: Constitutional: NAD General: chronically ill, O2 dependent pleasant male EYES: lids intact ENMT: ioral mucous membranes moist CV: S1S2, RRR Pulmonary: fine crackles throughout; no cough Abdomen: normo-active BS + 4 quadrants, soft and non tender MSK: ambulatory Skin: warm and  dry Neuro:  + generalized weakness,  no cognitive impairment Psych: non-anxious affect, A and O x 3  CURRENT PROBLEM LIST:  Patient Active Problem List   Diagnosis Date Noted   Chronic heart failure with preserved ejection fraction (HFpEF) ([place]) 02/28/2021   Pulmonary hypertension, unspecified ([place]) 02/28/2021   Shortness of breath 09/28/2020   Lower extremity edema 09/21/2020   IPF (idiopathic pulmonary fibrosis) ([place]) 08/08/2020   Chronic respiratory failure with hypoxia ([place]) 05/16/2020   Essential hypertension 03/03/2020   Generalized weakness    Thrombocytopenia ([place]) 12/29/2019   Port-A-Cath in place 11/02/2019   Encounter for antineoplastic chemotherapy 11/02/2019   Rectal cancer ([place]) 09/28/2019   Goals of care, counseling/discussion 09/12/2019   Coronary artery disease involving native coronary artery of native heart without angina pectoris 07/15/2018   Hx of adenomatous colonic polyps 07/15/2018   Hyperlipidemia LDL goal <70 07/15/2018   Personal history of other malignant neoplasm of skin 06/27/2014   PAST MEDICAL HISTORY:  Active Ambulatory Problems    Diagnosis Date Noted   Coronary artery disease involving native coronary artery of native heart without angina pectoris 07/15/2018   Hx of adenomatous colonic polyps 07/15/2018   Hyperlipidemia LDL goal <70 07/15/2018   Personal history of other malignant neoplasm of skin 06/27/2014   Goals of care, counseling/discussion 09/12/2019   Rectal cancer ([place]) 09/28/2019   Port-A-Cath in place  11/02/2019   Encounter for antineoplastic chemotherapy 11/02/2019   Thrombocytopenia ([place]) 12/29/2019   Generalized weakness    Essential hypertension 03/03/2020   Chronic respiratory failure with hypoxia ([place]) 05/16/2020   IPF (idiopathic pulmonary fibrosis) ([place]) 08/08/2020   Lower extremity edema 09/21/2020   Shortness of breath 09/28/2020   Chronic heart failure with preserved ejection fraction (HFpEF) ([place]) 02/28/2021    Pulmonary hypertension, unspecified ([place]) 02/28/2021   Resolved Ambulatory Problems    Diagnosis Date Noted   Acute respiratory failure with hypoxia ([place]) 03/03/2020   Pneumonia due to COVID-19 virus 03/03/2020   Past Medical History:  Diagnosis Date   (HFpEF) heart failure with preserved ejection fraction (HCC)    Actinic keratosis    Adenomatous polyp of colon    Arthritis    Basal cell carcinoma    CAD (coronary artery disease)    Cancer ([place]) 2021   Hyperlipidemia    Hypertension    Interstitial lung disease ([place])    Pneumonia    Pre-diabetes    Respiratory failure with hypoxia ([place])    Squamous cell carcinoma of skin 03/05/2021   SOCIAL HX:  Social History   Tobacco Use   Smoking status: Never   Smokeless tobacco: Former    Types: Chew  Substance Use Topics   Alcohol use: Yes    Alcohol/week: 0.0 standard drinks    Comment: OCCAS-not within the last year as of 09/27/2020   FAMILY HX:  Family History  Problem Relation Age of Onset   Lung disease Father     reviewed  ALLERGIES:  Allergies  Allergen Reactions   Ofev [Nintedanib] Diarrhea and Nausea Only     PERTINENT MEDICATIONS:  Outpatient Encounter Medications as of 05/23/2021  Medication Sig   aspirin EC 81 MG tablet Take 1 tablet (81 mg total) by mouth daily. Swallow whole.   atorvastatin (LIPITOR) 20 MG tablet Take 20 mg by mouth daily.   furosemide (LASIX) 40 MG tablet TAKE 1 TABLET BY MOUTH ONCE DAILY   metoprolol tartrate (LOPRESSOR) 25 MG tablet Take 25 mg by mouth 2 (two) times daily.   Pirfenidone (ESBRIET) 267 MG TABS Take 2 tablets (534 mg total) by mouth in the morning, at noon, and at bedtime.   predniSONE (DELTASONE) 10 MG tablet Take 1 tablet (10 mg total) by mouth daily with breakfast.   predniSONE (DELTASONE) 5 MG tablet Take 1 tablet (5 mg total) by mouth daily with breakfast.   temazepam (RESTORIL) 30 MG capsule Take 30 mg by mouth at bedtime.    Treprostinil (TYVASO) 0.6 MG/ML SOLN  Inhale into the lungs 4 (four) times daily.   No facility-administered encounter medications on file as of 05/23/2021.   Thank you for the opportunity to participate in the care of Garrett. Salois.  The palliative care team will continue to follow. Please call our office at [phone] if we can be of additional assistance.   Questions and concerns were addressed. The patient/family was encouraged to call with questions and/or concerns. My business card was provided. Provided general support and encouragement, no other unmet needs identified   This chart was dictated using voice recognition software.  Despite best efforts to proofread,  errors can occur which can change the documentation meaning.   Beatric Fulop Z Akasia Ahmad, NP ,   COVID-19 PATIENT SCREENING TOOL Asked and negative response unless otherwise noted:  Have you had symptoms of covid, tested positive or been in contact with someone with symptoms/positive test in the past 5-10  days? NO   _______________________________________________ Advance Care Planning/Goals of Care: Goals include to maximize quality of life and symptom management. Patient/health care surrogate gave his/her permission to discuss.Our advance care planning conversation included a discussion about:    The value and importance of advance care planning  Experiences with loved ones who have been seriously ill or have died  Exploration of personal, cultural or spiritual beliefs that might influence medical decisions  Exploration of goals of care in the event of a sudden injury or illness  Identification  of a healthcare agent  Review and updating or creation of an  advance directive document . Decision not to resuscitate or to de-escalate disease focused treatments due to poor prognosis. CODE STATUS: DNR  Advance Care Planning; Discussed and wishes are to be DNR, goldenrod form completed. Reviewed MOST form, completed with DNR, no intubation, no ventilator, limited  interventions, wishes are for IVF and antibiotics, no feeding tube. Will put in vynca.   Total time 32 minutes for ACP

## 2021-06-04 ENCOUNTER — Telehealth: Payer: Self-pay | Admitting: Oncology

## 2021-06-04 NOTE — Telephone Encounter (Signed)
Caregiver called to make an appt for pt. Call back at [phone]

## 2021-06-04 NOTE — Telephone Encounter (Signed)
Please schedule patient for labs (next available appt.), then MD 2-3 days after labs.

## 2021-06-05 NOTE — Telephone Encounter (Signed)
Spoke to pt, he is aware of appts.

## 2021-06-11 ENCOUNTER — Telehealth: Payer: Self-pay

## 2021-06-11 NOTE — Telephone Encounter (Signed)
1150 am.  Incoming all from patient.  Visit scheduled for 2/13 @ 1 pm.

## 2021-06-11 NOTE — Telephone Encounter (Signed)
1130 am.  Request received from [place], NP to schedule a follow up visit with patient.  Phone call made to patient but no answer and unable to leave a message.  Will try again at a later time.

## 2021-06-12 ENCOUNTER — Telehealth: Payer: Self-pay | Admitting: Internal Medicine

## 2021-06-12 NOTE — Telephone Encounter (Signed)
Fax received from Dr. Madolyn Frieze to perform a Colonoscopy on patient.  Patient needs surgery clearance. Patient was seen on 04/25/2021. Office protocol is a risk assessment can be sent to surgeon if patient has been seen in 60 days or less.   Sending to Dr. Chase Caller for risk assessment

## 2021-06-12 NOTE — Telephone Encounter (Signed)
Belated happy birthday. I Think Dr Alice Reichert should call me directly . Risk is moderate-high and needs to be done in hospital under anesthesia

## 2021-06-18 ENCOUNTER — Telehealth: Payer: Self-pay | Admitting: Internal Medicine

## 2021-06-18 NOTE — Telephone Encounter (Signed)
I checked the [facility] schedule for today, there are no openings for today.

## 2021-06-18 NOTE — Telephone Encounter (Signed)
Spoke with the pt and notified of response per MR  He verbalized understanding  Nothing further needed

## 2021-06-18 NOTE — Telephone Encounter (Signed)
[place] [phone]  -Can he  be seen acutely in the [facility] clinic today ? -He might need admission

## 2021-06-18 NOTE — Telephone Encounter (Signed)
Needs to go to ER 06/18/2021 at [facility] and get eval for likely admit. IPF might be worsneing/flare up. Can be pna or chf

## 2021-06-18 NOTE — Telephone Encounter (Signed)
Spoke with the pt  He states breathing has been worse over the weekend  He feels okay at rest but as soon as he gets up to walk even just across the room he gets SOB Sats on 8lpm cont o2 are running 75-80 with exertion  He is always above 90% at rest  He says he feels like he has a lot of chest congestion but denies any cough, wheezing, fevers, aches  Please advise thanks!

## 2021-06-19 ENCOUNTER — Other Ambulatory Visit: Payer: Self-pay

## 2021-06-19 ENCOUNTER — Emergency Department (HOSPITAL_COMMUNITY): Payer: Medicare Other

## 2021-06-19 ENCOUNTER — Inpatient Hospital Stay (HOSPITAL_COMMUNITY)
Admission: EM | Admit: 2021-06-19 | Discharge: 2021-06-21 | DRG: 189 | Disposition: A | Discharge status: Home Health | Payer: Medicare Other | Attending: Internal Medicine | Admitting: Internal Medicine

## 2021-06-19 ENCOUNTER — Encounter (HOSPITAL_COMMUNITY): Payer: Self-pay

## 2021-06-19 DIAGNOSIS — I10 Essential (primary) hypertension: Secondary | ICD-10-CM | POA: Diagnosis present

## 2021-06-19 DIAGNOSIS — Z6832 Body mass index (BMI) 32.0-32.9, adult: Secondary | ICD-10-CM

## 2021-06-19 DIAGNOSIS — R0602 Shortness of breath: Secondary | ICD-10-CM | POA: Diagnosis not present

## 2021-06-19 DIAGNOSIS — M1990 Unspecified osteoarthritis, unspecified site: Secondary | ICD-10-CM | POA: Diagnosis present

## 2021-06-19 DIAGNOSIS — Z7952 Long term (current) use of systemic steroids: Secondary | ICD-10-CM

## 2021-06-19 DIAGNOSIS — J84112 Idiopathic pulmonary fibrosis: Secondary | ICD-10-CM | POA: Diagnosis present

## 2021-06-19 DIAGNOSIS — J9601 Acute respiratory failure with hypoxia: Secondary | ICD-10-CM | POA: Diagnosis present

## 2021-06-19 DIAGNOSIS — Z9049 Acquired absence of other specified parts of digestive tract: Secondary | ICD-10-CM

## 2021-06-19 DIAGNOSIS — J9621 Acute and chronic respiratory failure with hypoxia: Principal | ICD-10-CM | POA: Diagnosis present

## 2021-06-19 DIAGNOSIS — R0902 Hypoxemia: Secondary | ICD-10-CM

## 2021-06-19 DIAGNOSIS — Z888 Allergy status to other drugs, medicaments and biological substances status: Secondary | ICD-10-CM

## 2021-06-19 DIAGNOSIS — E785 Hyperlipidemia, unspecified: Secondary | ICD-10-CM | POA: Diagnosis present

## 2021-06-19 DIAGNOSIS — Z85828 Personal history of other malignant neoplasm of skin: Secondary | ICD-10-CM

## 2021-06-19 DIAGNOSIS — Z87891 Personal history of nicotine dependence: Secondary | ICD-10-CM

## 2021-06-19 DIAGNOSIS — J849 Interstitial pulmonary disease, unspecified: Secondary | ICD-10-CM

## 2021-06-19 DIAGNOSIS — Z85048 Personal history of other malignant neoplasm of rectum, rectosigmoid junction, and anus: Secondary | ICD-10-CM

## 2021-06-19 DIAGNOSIS — I2510 Atherosclerotic heart disease of native coronary artery without angina pectoris: Secondary | ICD-10-CM | POA: Diagnosis present

## 2021-06-19 DIAGNOSIS — E669 Obesity, unspecified: Secondary | ICD-10-CM | POA: Diagnosis present

## 2021-06-19 DIAGNOSIS — Z66 Do not resuscitate: Secondary | ICD-10-CM | POA: Diagnosis present

## 2021-06-19 DIAGNOSIS — Z20822 Contact with and (suspected) exposure to covid-19: Secondary | ICD-10-CM | POA: Diagnosis present

## 2021-06-19 DIAGNOSIS — I110 Hypertensive heart disease with heart failure: Secondary | ICD-10-CM | POA: Diagnosis present

## 2021-06-19 DIAGNOSIS — Z79899 Other long term (current) drug therapy: Secondary | ICD-10-CM

## 2021-06-19 DIAGNOSIS — J189 Pneumonia, unspecified organism: Secondary | ICD-10-CM | POA: Diagnosis present

## 2021-06-19 DIAGNOSIS — I5032 Chronic diastolic (congestive) heart failure: Secondary | ICD-10-CM | POA: Diagnosis present

## 2021-06-19 DIAGNOSIS — Z951 Presence of aortocoronary bypass graft: Secondary | ICD-10-CM

## 2021-06-19 DIAGNOSIS — Z9981 Dependence on supplemental oxygen: Secondary | ICD-10-CM

## 2021-06-19 DIAGNOSIS — Z7982 Long term (current) use of aspirin: Secondary | ICD-10-CM

## 2021-06-19 DIAGNOSIS — I272 Pulmonary hypertension, unspecified: Secondary | ICD-10-CM | POA: Diagnosis present

## 2021-06-19 DIAGNOSIS — Z9221 Personal history of antineoplastic chemotherapy: Secondary | ICD-10-CM

## 2021-06-19 LAB — RESPIRATORY PANEL BY PCR

## 2021-06-19 LAB — CBC
HCT: 38.2 % — ABNORMAL LOW (ref 39.0–52.0)
HCT: 40.4 % (ref 39.0–52.0)
Hemoglobin: 12.9 g/dL — ABNORMAL LOW (ref 13.0–17.0)
Hemoglobin: 13.5 g/dL (ref 13.0–17.0)
MCH: 33.4 pg (ref 26.0–34.0)
MCH: 33.2 pg (ref 26.0–34.0)
MCHC: 33.8 g/dL (ref 30.0–36.0)
MCHC: 33.4 g/dL (ref 30.0–36.0)
MCV: 99 fL (ref 80.0–100.0)
MCV: 99.3 fL (ref 80.0–100.0)
Platelets: 177 10*3/uL (ref 150–400)
Platelets: 187 10*3/uL (ref 150–400)
RBC: 3.86 MIL/uL — ABNORMAL LOW (ref 4.22–5.81)
RBC: 4.07 MIL/uL — ABNORMAL LOW (ref 4.22–5.81)
RDW: 12.6 % (ref 11.5–15.5)
RDW: 12.6 % (ref 11.5–15.5)
WBC: 11 10*3/uL — ABNORMAL HIGH (ref 4.0–10.5)
WBC: 9.7 10*3/uL (ref 4.0–10.5)
nRBC: 0 % (ref 0.0–0.2)
nRBC: 0 % (ref 0.0–0.2)

## 2021-06-19 LAB — CREATININE, SERUM
Creatinine, Ser: 0.83 mg/dL (ref 0.61–1.24)
GFR, Estimated: >60 mL/min (ref >60)

## 2021-06-19 LAB — BRAIN NATRIURETIC PEPTIDE: B Natriuretic Peptide: 130.7 pg/mL — ABNORMAL HIGH (ref 0.0–100.0)

## 2021-06-19 LAB — BLOOD GAS, VENOUS
Acid-Base Excess: 7.4 mmol/L — ABNORMAL HIGH (ref 0.0–2.0)
Bicarbonate: 34.4 mmol/L — ABNORMAL HIGH (ref 20.0–28.0)
O2 Saturation: 73.8 %
Patient temperature: 98.6
pCO2, Ven: 61.4 mmHg — ABNORMAL HIGH (ref 44.0–60.0)
pH, Ven: 7.367 (ref 7.250–7.430)
pO2, Ven: 42.4 mmHg (ref 32.0–45.0)

## 2021-06-19 LAB — COMPREHENSIVE METABOLIC PANEL
ALT: 25 U/L (ref 0–44)
AST: 26 U/L (ref 15–41)
Albumin: 3.7 g/dL (ref 3.5–5.0)
Alkaline Phosphatase: 126 U/L (ref 38–126)
Anion gap: 7 (ref 5–15)
BUN: 10 mg/dL (ref 8–23)
CO2: 35 mmol/L — ABNORMAL HIGH (ref 22–32)
Calcium: 8.7 mg/dL — ABNORMAL LOW (ref 8.9–10.3)
Chloride: 97 mmol/L — ABNORMAL LOW (ref 98–111)
Creatinine, Ser: 0.77 mg/dL (ref 0.61–1.24)
GFR, Estimated: >60 mL/min (ref >60)
Glucose, Bld: 141 mg/dL — ABNORMAL HIGH (ref 70–99)
Potassium: 3.5 mmol/L (ref 3.5–5.1)
Sodium: 139 mmol/L (ref 135–145)
Total Bilirubin: 0.5 mg/dL (ref 0.3–1.2)
Total Protein: 7.5 g/dL (ref 6.5–8.1)

## 2021-06-19 LAB — RESP PANEL BY RT-PCR (FLU A&B, COVID) ARPGX2
Influenza A by PCR: NEGATIVE
Influenza B by PCR: NEGATIVE
SARS Coronavirus 2 by RT PCR: NEGATIVE

## 2021-06-19 LAB — LACTATE DEHYDROGENASE: LDH: 229 U/L — ABNORMAL HIGH (ref 98–192)

## 2021-06-19 LAB — PROCALCITONIN: Procalcitonin: <0.1 ng/mL

## 2021-06-19 LAB — LACTIC ACID, PLASMA: Lactic Acid, Venous: 1.9 mmol/L (ref 0.5–1.9)

## 2021-06-19 LAB — MRSA NEXT GEN BY PCR, NASAL: MRSA by PCR Next Gen: NOT DETECTED

## 2021-06-19 LAB — C-REACTIVE PROTEIN: CRP: 1.6 mg/dL — ABNORMAL HIGH (ref <1.0)

## 2021-06-19 LAB — D-DIMER, QUANTITATIVE: D-Dimer, Quant: 0.89 ug/mL-FEU — ABNORMAL HIGH (ref 0.00–0.50)

## 2021-06-19 LAB — CK: Total CK: 36 U/L — ABNORMAL LOW (ref 49–397)

## 2021-06-19 MED ORDER — ONDANSETRON HCL 4 MG PO TABS: 4.0000 mg | ORAL_TABLET | Freq: Four times a day (QID) | ORAL | Status: DC | PRN

## 2021-06-19 MED ORDER — ONDANSETRON HCL 4 MG/2ML IJ SOLN: 4.0000 mg | Freq: Four times a day (QID) | INTRAMUSCULAR | Status: DC | PRN

## 2021-06-19 MED ORDER — ACETAMINOPHEN 325 MG PO TABS
650.0000 mg | ORAL_TABLET | Freq: Four times a day (QID) | ORAL | Status: DC | PRN
  Filled 2021-06-19: qty 2

## 2021-06-19 MED ORDER — SODIUM CHLORIDE 0.9 % IV SOLN: 250.0000 mL | INTRAVENOUS | Status: DC | PRN

## 2021-06-19 MED ORDER — SODIUM CHLORIDE 0.9% FLUSH: 3.0000 mL | INTRAVENOUS | Status: DC | PRN

## 2021-06-19 MED ORDER — FUROSEMIDE 40 MG PO TABS
40.0000 mg | ORAL_TABLET | Freq: Every day | ORAL | Status: DC
  Administered 2021-06-19 – 2021-06-20 (×2): 40 mg via ORAL
  Filled 2021-06-19 (×2): qty 1

## 2021-06-19 MED ORDER — ATORVASTATIN CALCIUM 10 MG PO TABS
20.0000 mg | ORAL_TABLET | Freq: Every day | ORAL | Status: DC
  Administered 2021-06-19 – 2021-06-21 (×3): 20 mg via ORAL
  Filled 2021-06-19 (×3): qty 2

## 2021-06-19 MED ORDER — IPRATROPIUM-ALBUTEROL 0.5-2.5 (3) MG/3ML IN SOLN
3.0000 mL | Freq: Four times a day (QID) | RESPIRATORY_TRACT | Status: DC
  Administered 2021-06-19 – 2021-06-20 (×4): 3 mL via RESPIRATORY_TRACT
  Filled 2021-06-19 (×4): qty 3

## 2021-06-19 MED ORDER — METHYLPREDNISOLONE SODIUM SUCC 40 MG IJ SOLR
40.0000 mg | Freq: Two times a day (BID) | INTRAMUSCULAR | Status: DC
  Administered 2021-06-20 – 2021-06-21 (×4): 40 mg via INTRAVENOUS
  Filled 2021-06-19 (×4): qty 1

## 2021-06-19 MED ORDER — ENOXAPARIN SODIUM 40 MG/0.4ML IJ SOSY
40.0000 mg | PREFILLED_SYRINGE | INTRAMUSCULAR | Status: DC
  Administered 2021-06-19 – 2021-06-20 (×2): 40 mg via SUBCUTANEOUS
  Filled 2021-06-19 (×2): qty 0.4

## 2021-06-19 MED ORDER — METHYLPREDNISOLONE SODIUM SUCC 125 MG IJ SOLR
125.0000 mg | INTRAMUSCULAR | Status: AC
  Administered 2021-06-19: 125 mg via INTRAVENOUS
  Filled 2021-06-19: qty 2

## 2021-06-19 MED ORDER — ACETAMINOPHEN 650 MG RE SUPP: 650.0000 mg | Freq: Four times a day (QID) | RECTAL | Status: DC | PRN

## 2021-06-19 MED ORDER — METOPROLOL TARTRATE 25 MG PO TABS
25.0000 mg | ORAL_TABLET | Freq: Two times a day (BID) | ORAL | Status: DC
  Administered 2021-06-19 – 2021-06-21 (×4): 25 mg via ORAL
  Filled 2021-06-19 (×4): qty 1

## 2021-06-19 MED ORDER — SODIUM CHLORIDE 0.9 % IV SOLN
1.0000 g | INTRAVENOUS | Status: DC
  Administered 2021-06-19 – 2021-06-20 (×2): 1 g via INTRAVENOUS
  Filled 2021-06-19 (×4): qty 10

## 2021-06-19 MED ORDER — SODIUM CHLORIDE 0.9 % IV SOLN
500.0000 mg | INTRAVENOUS | Status: DC
  Administered 2021-06-19 – 2021-06-20 (×2): 500 mg via INTRAVENOUS
  Filled 2021-06-19 (×3): qty 5

## 2021-06-19 MED ORDER — ASPIRIN EC 81 MG PO TBEC
81.0000 mg | DELAYED_RELEASE_TABLET | Freq: Every day | ORAL | Status: DC
  Administered 2021-06-19 – 2021-06-21 (×3): 81 mg via ORAL
  Filled 2021-06-19 (×3): qty 1

## 2021-06-19 MED ORDER — PIRFENIDONE 267 MG PO TABS
534.0000 mg | ORAL_TABLET | Freq: Three times a day (TID) | ORAL | Status: DC
  Administered 2021-06-20 – 2021-06-21 (×2): 534 mg via ORAL
  Filled 2021-06-19: qty 2

## 2021-06-19 MED ORDER — SODIUM CHLORIDE 0.9% FLUSH
3.0000 mL | Freq: Two times a day (BID) | INTRAVENOUS | Status: DC
  Administered 2021-06-19 – 2021-06-21 (×2): 3 mL via INTRAVENOUS

## 2021-06-19 MED ORDER — TEMAZEPAM 15 MG PO CAPS
30.0000 mg | ORAL_CAPSULE | Freq: Once | ORAL | Status: AC
  Administered 2021-06-20: 30 mg via ORAL
  Filled 2021-06-19: qty 2

## 2021-06-19 NOTE — H&P (Signed)
TRH H&P    Patient Demographics:    Garrett Peterson, is a 76 y.o. male  MRN: [id number]  DOB - 09-24-1945  Admit Date - 06/19/2021  Referring MD/NP/PA: Dr Jeanell Sparrow  Outpatient Primary MD for the patient is Albina Billet, MD  Patient coming from: Home  Chief complaint- Shortness of breath   HPI:    Garrett Peterson  is a 76 y.o. male, with history of interstitial lung disease on 8 L oxygen via nasal cannula at home, CAD, HFpEF, hypertension, and rectal cancer status post sigmoid colectomy came to hospital with worsening shortness of breath.  As per patient since last Wednesday he was becoming hypoxemic on ambulation.  His O2 sats would drop from 98 on sitting to 70s on ambulation.  He resisted coming to the hospital, but today he felt weak and called his pulmonologist who told him to go to the hospital. He denies coughing up any phlegm.  Denies fever or chills.  Denies nausea vomiting or diarrhea.  Denies abdominal pain. In the ED chest x-ray showed increased interstitial markings in the mid and lower lung fields suggesting pulmonary fibrosis.  Possibility of superimposed interstitial anemia not excluded.  PCCM was consulted, patient started on ceftriaxone and Zithromax for pneumonia.  He also received Solu-Medrol 125 mg IV in the ED.    Review of systems:    In addition to the HPI above,  All other systems reviewed and are negative.    Past History of the following :    Past Medical History:  Diagnosis Date   (HFpEF) heart failure with preserved ejection fraction ([place])    a. 05/2020 Echo: EF 55-60%, no rwma, Gr1 DD, mildly red RV fxn; b. 09/2020 RHC: PCWP 56mHg. CO/CI 4.7/2.1.   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 09/2020 Cath: LM 70d, LAD 70ost, 80/747mLCX  100ost/p, 99p/m, RCA 100ost/p, VG->OM2 4033mIMA->LAD nl, VG->RPDA nl-->Med Rx.   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   right scalp/neck postauricular - [facility]      Past Surgical History:  Procedure Laterality Date   APPENDECTOMY     BRONCHIAL WASHINGS  05/18/2020   Procedure: BRONCHIAL WASHINGS;  Surgeon: EllMargaretha SeedsD;  [facility];;   CARDIAC CATHETERIZATION     PT DENIES   COLONOSCOPY     2011,2015,2021   COLONOSCOPY WITH PROPOFOL N/A 07/10/2020   Procedure: COLONOSCOPY WITH PROPOFOL;  Surgeon: Toledo, TeoBenay PikeD;  Location: [facility];  Service: Gastroenterology;  Laterality: N/A;   CORONARY ARTERY BYPASS GRAFT     FLEXIBLE BRONCHOSCOPY N/A 04/24/2020  Procedure: FLEXIBLE BRONCHOSCOPY;  Surgeon: Ottie Glazier, MD;  Location: [facility];  Service: Thoracic;  Laterality: N/A;   IR SINUS/FIST TUBE CHK-NON GI  10/27/2019   open heart surgery     PORTACATH PLACEMENT Right 10/13/2019   Procedure: INSERTION PORT-A-CATH;  Surgeon: Herbert Pun, MD;  Location: [facility];  Service: General;  Laterality: Right;   RIGHT/LEFT HEART CATH AND CORONARY ANGIOGRAPHY N/A 10/03/2020   Procedure: RIGHT/LEFT HEART CATH AND CORONARY ANGIOGRAPHY;  Surgeon: Nelva Bush, MD;  Location: [facility];  Service: Cardiovascular;  Laterality: N/A;   ROBOT ASSISTED LAPAROSCOPIC PARTIAL COLECTOMY  09/2019   VIDEO BRONCHOSCOPY N/A 05/18/2020   Procedure: VIDEO BRONCHOSCOPY WITHOUT FLUORO;  Surgeon: Margaretha Seeds, MD;  Location: [facility];  Service: Pulmonary;  Laterality: N/A;      Social History:      Social History   Tobacco Use   Smoking status: Never   Smokeless tobacco: Former    Types: Chew  Substance Use Topics   Alcohol use: Yes    Alcohol/week: 0.0  standard drinks    Comment: OCCAS-not within the last year as of 09/27/2020       Family History :     Family History  Problem Relation Age of Onset   Lung disease Father       Home Medications:   Prior to Admission medications   Medication Sig Start Date End Date Taking? Authorizing Provider  aspirin EC 81 MG tablet Take 1 tablet (81 mg total) by mouth daily. Swallow whole. 09/27/20   End, Harrell Gave, MD  atorvastatin (LIPITOR) 20 MG tablet Take 20 mg by mouth daily.    [provider]  furosemide (LASIX) 40 MG tablet TAKE 1 TABLET BY MOUTH ONCE DAILY 03/19/21   End, Harrell Gave, MD  metoprolol tartrate (LOPRESSOR) 25 MG tablet Take 25 mg by mouth 2 (two) times daily. 11/03/19   [provider]  Pirfenidone (ESBRIET) 267 MG TABS Take 2 tablets (534 mg total) by mouth in the morning, at noon, and at bedtime. 05/15/21   Brand Males, MD  predniSONE (DELTASONE) 10 MG tablet Take 1 tablet (10 mg total) by mouth daily with breakfast. 04/27/21   Brand Males, MD  predniSONE (DELTASONE) 5 MG tablet Take 1 tablet (5 mg total) by mouth daily with breakfast. 04/27/21   Brand Males, MD  temazepam (RESTORIL) 30 MG capsule Take 30 mg by mouth at bedtime.  06/09/14   [provider]  Treprostinil (TYVASO) 0.6 MG/ML SOLN Inhale into the lungs 4 (four) times daily.    [provider]     Allergies:     Allergies  Allergen Reactions   Ofev [Nintedanib] Diarrhea and Nausea Only     Physical Exam:   Vitals  Blood pressure (!) 155/77, pulse (!) 59, temperature 98.5 F (36.9 C), temperature source Oral, resp. rate (!) 23, height _0  (1.753 m), weight 98.9 kg, SpO2 100 %.  1.  General: Appears in no acute distress  2. Psychiatric: Alert, oriented x3, intact insight and judgment  3. Neurologic: Cranial nerves II through XII grossly intact, sensations intact bilaterally  4. HEENMT:  Atraumatic normocephalic, extraocular muscles are  intact  5. Respiratory : Bilateral inspiratory crackles in all lung fields  6. Cardiovascular : S1-S2, regular, no murmur auscultated  7. Gastrointestinal:  Abdomen is soft, nontender, no organomegaly  8. Skin:  No rashes noted  :     Data Review:    CBC Recent Labs  Lab  06/19/21 1156  WBC 11.0*  HGB 12.9*  HCT 38.2*  PLT 177  MCV 99.0  MCH 33.4  MCHC 33.8  RDW 12.6   ------------------------------------------------------------------------------------------------------------------  Results for orders placed or performed during the hospital encounter of 06/19/21 (from the past 48 hour(s))  CBC     Status: Abnormal   Collection Time: 06/19/21 11:56 AM  Result Value Ref Range   WBC 11.0 (H) 4.0 - 10.5 K/uL   RBC 3.86 (L) 4.22 - 5.81 MIL/uL   Hemoglobin 12.9 (L) 13.0 - 17.0 g/dL   HCT 38.2 (L) 39.0 - 52.0 %   MCV 99.0 80.0 - 100.0 fL   MCH 33.4 26.0 - 34.0 pg   MCHC 33.8 30.0 - 36.0 g/dL   RDW 12.6 11.5 - 15.5 %   Platelets 177 150 - 400 K/uL   nRBC 0.0 0.0 - 0.2 %    Comment: Performed at [facility], [place] [address] [place], [place] [address]  Comprehensive metabolic panel     Status: Abnormal   Collection Time: 06/19/21 11:56 AM  Result Value Ref Range   Sodium 139 135 - 145 mmol/L   Potassium 3.5 3.5 - 5.1 mmol/L   Chloride 97 (L) 98 - 111 mmol/L   CO2 35 (H) 22 - 32 mmol/L   Glucose, Bld 141 (H) 70 - 99 mg/dL    Comment: Glucose reference range applies only to samples taken after fasting for at least 8 hours.   BUN 10 8 - 23 mg/dL   Creatinine, Ser 0.77 0.61 - 1.24 mg/dL   Calcium 8.7 (L) 8.9 - 10.3 mg/dL   Total Protein 7.5 6.5 - 8.1 g/dL   Albumin 3.7 3.5 - 5.0 g/dL   AST 26 15 - 41 U/L   ALT 25 0 - 44 U/L   Alkaline Phosphatase 126 38 - 126 U/L   Total Bilirubin 0.5 0.3 - 1.2 mg/dL   GFR, Estimated >60 >60 mL/min    Comment: (NOTE) Calculated using the CKD-EPI Creatinine Equation (2021)    Anion gap 7 5 - 15    Comment:  Performed at [facility], [place] [address] [place], [place] [address]  Blood gas, venous (at [facility] and [facility], not at [facility])     Status: Abnormal   Collection Time: 06/19/21 11:56 AM  Result Value Ref Range   pH, Ven 7.367 7.250 - 7.430   pCO2, Ven 61.4 (H) 44.0 - 60.0 mmHg   pO2, Ven 42.4 32.0 - 45.0 mmHg   Bicarbonate 34.4 (H) 20.0 - 28.0 mmol/L   Acid-Base Excess 7.4 (H) 0.0 - 2.0 mmol/L   O2 Saturation 73.8 %   Patient temperature 98.6     Comment: Performed at [facility], [place] [address], [place], [place] [address]  Resp Panel by RT-PCR (Flu A&B, Covid) Nasopharyngeal Swab     Status: None   Collection Time: 06/19/21 11:56 AM   Specimen: Nasopharyngeal Swab; Nasopharyngeal(NP) swabs in vial transport medium  Result Value Ref Range   SARS Coronavirus 2 by RT PCR NEGATIVE NEGATIVE    Comment: (NOTE) SARS-CoV-2 target nucleic acids are NOT DETECTED.  The SARS-CoV-2 RNA is generally detectable in upper respiratory specimens during the acute phase of infection. The lowest concentration of SARS-CoV-2 viral copies this assay can detect is 138 copies/mL. A negative result does not preclude SARS-Cov-2 infection and should not be used as the sole basis for treatment or other patient management decisions. A negative result may  occur with  improper specimen collection/handling, submission of specimen other than nasopharyngeal swab, presence of viral mutation(s) within the areas targeted by this assay, and inadequate number of viral copies(<138 copies/mL). A negative result must be combined with clinical observations, patient history, and epidemiological information. The expected result is Negative.  Fact Sheet for Patients:  EntrepreneurPulse.com.au  Fact Sheet for Healthcare Providers:  IncredibleEmployment.be  This test is no t yet approved or cleared by the [place] FDA and  has been authorized for detection  and/or diagnosis of SARS-CoV-2 by FDA under an Emergency Use Authorization (EUA). This EUA will remain  in effect (meaning this test can be used) for the duration of the COVID-19 declaration under Section 564(b)(1) of the Act, 21 U.S.C.section 360bbb-3(b)(1), unless the authorization is terminated  or revoked sooner.       Influenza A by PCR NEGATIVE NEGATIVE   Influenza B by PCR NEGATIVE NEGATIVE    Comment: (NOTE) The Xpert Xpress SARS-CoV-2/FLU/RSV plus assay is intended as an aid in the diagnosis of influenza from Nasopharyngeal swab specimens and should not be used as a sole basis for treatment. Nasal washings and aspirates are unacceptable for Xpert Xpress SARS-CoV-2/FLU/RSV testing.  Fact Sheet for Patients: EntrepreneurPulse.com.au  Fact Sheet for Healthcare Providers: IncredibleEmployment.be  This test is not yet approved or cleared by the [place] FDA and has been authorized for detection and/or diagnosis of SARS-CoV-2 by FDA under an Emergency Use Authorization (EUA). This EUA will remain in effect (meaning this test can be used) for the duration of the COVID-19 declaration under Section 564(b)(1) of the Act, 21 U.S.C. section 360bbb-3(b)(1), unless the authorization is terminated or revoked.  Performed at [facility], [place] [address] [place], [place] [address]   Brain natriuretic peptide     Status: Abnormal   Collection Time: 06/19/21 11:56 AM  Result Value Ref Range   B Natriuretic Peptide 130.7 (H) 0.0 - 100.0 pg/mL    Comment: Performed at [facility], [place] [address], [place], [place] [address]  Lactate dehydrogenase     Status: Abnormal   Collection Time: 06/19/21  2:50 PM  Result Value Ref Range   LDH 229 (H) 98 - 192 U/L    Comment: Performed at [facility], [place] [address], [place], [place] [address]  Procalcitonin - Baseline     Status: None    Collection Time: 06/19/21  2:50 PM  Result Value Ref Range   Procalcitonin <0.10 ng/mL    Comment:        Interpretation: PCT (Procalcitonin) <= 0.5 ng/mL: Systemic infection (sepsis) is not likely. Local bacterial infection is possible. (NOTE)       Sepsis PCT Algorithm           Lower Respiratory Tract                                      Infection PCT Algorithm    ----------------------------     ----------------------------         PCT < 0.25 ng/mL                PCT < 0.10 ng/mL          Strongly encourage             Strongly discourage   discontinuation of antibiotics    initiation of antibiotics    ----------------------------     -----------------------------  PCT 0.25 - 0.50 ng/mL            PCT 0.10 - 0.25 ng/mL               OR       >80% decrease in PCT            Discourage initiation of                                            antibiotics      Encourage discontinuation           of antibiotics    ----------------------------     -----------------------------         PCT >= 0.50 ng/mL              PCT 0.26 - 0.50 ng/mL               AND        <80% decrease in PCT             Encourage initiation of                                             antibiotics       Encourage continuation           of antibiotics    ----------------------------     -----------------------------        PCT >= 0.50 ng/mL                  PCT > 0.50 ng/mL               AND         increase in PCT                  Strongly encourage                                      initiation of antibiotics    Strongly encourage escalation           of antibiotics                                     -----------------------------                                           PCT <= 0.25 ng/mL                                                 OR                                        > 80% decrease in PCT  Discontinue / Do not initiate                                              antibiotics  Performed at [place] [address], [place],  [address]     Chemistries  Recent Labs  Lab 06/19/21 1156  NA 139  K 3.5  CL 97*  CO2 35*  GLUCOSE 141*  BUN 10  CREATININE 0.77  CALCIUM 8.7*  AST 26  ALT 25  ALKPHOS 126  BILITOT 0.5   ------------------------------------------------------------------------------------------------------------------  ------------------------------------------------------------------------------------------------------------------ GFR: Estimated Creatinine Clearance: 89.7 mL/min (by C-G formula based on SCr of 0.77 mg/dL). Liver Function Tests: Recent Labs  Lab 06/19/21 1156  AST 26  ALT 25  ALKPHOS 126  BILITOT 0.5  PROT 7.5  ALBUMIN 3.7   No results for input(s): LIPASE, AMYLASE in the last 168 hours. No results for input(s): AMMONIA in the last 168 hours. Coagulation Profile: No results for input(s): INR, PROTIME in the last 168 hours. Cardiac Enzymes: No results for input(s): CKTOTAL, CKMB, CKMBINDEX, TROPONINI in the last 168 hours. BNP (last 3 results) Recent Labs    09/21/20 1005 03/21/21 1130  PROBNP 116.0* 141.0*   HbA1C: No results for input(s): HGBA1C in the last 72 hours. CBG: No results for input(s): GLUCAP in the last 168 hours. Lipid Profile: No results for input(s): CHOL, HDL, LDLCALC, TRIG, CHOLHDL, LDLDIRECT in the last 72 hours. Thyroid Function Tests: No results for input(s): TSH, T4TOTAL, FREET4, T3FREE, THYROIDAB in the last 72 hours. Anemia Panel: No results for input(s): VITAMINB12, FOLATE, FERRITIN, TIBC, IRON, RETICCTPCT in the last 72 hours.  --------------------------------------------------------------------------------------------------------------- Urine analysis: No results found for: COLORURINE, APPEARANCEUR, LABSPEC, PHURINE, GLUCOSEU, HGBUR, BILIRUBINUR, KETONESUR, PROTEINUR, UROBILINOGEN, NITRITE, LEUKOCYTESUR    Imaging  Results:    DG Chest Port 1 View  Result Date: 06/19/2021 CLINICAL DATA:  Shortness of breath, hypoxia EXAM: PORTABLE CHEST 1 VIEW COMPARISON:  Previous studies including the radiographs done on 05/16/2020 and CT done on 03/22/2021 FINDINGS: Transverse diameter of heart is increased. Increased interstitial markings are seen in the mid and lower lung fields. Interstitial fibrosis was noted in the CT chest done on 03/22/2021. Upper lung fields are unremarkable. There is poor inspiration. There is no significant pleural effusion or pneumothorax. Tip of central venous catheter is seen in the superior vena cava. 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 IMPRESSION: Increased interstitial markings are seen in the mid and lower lung fields suggesting pulmonary fibrosis. Possibility of superimposed interstitial pneumonia is not excluded. Electronically Signed   By: Elmer Picker M.D.   On: 06/19/2021 11:55    My personal review of EKG: Rhythm NSR, no ST changes   Assessment & Plan:    Principal Problem:   Acute hypoxemic respiratory failure (HCC)   Acute hypoxemic respiratory failure-secondary to exacerbation of interstitial lung disease.  Patient given Solu-Medrol 125 mg IV x1 in the ED.  We will start Solu-Medrol 40 mg IV every 12 hours from tomorrow morning.  DuoNeb nebulizers every 6 hours scheduled.  PCCM has been consulted ?  Community-acquired pneumonia-chest x-ray shows interstitial pneumonia, patient started on ceftriaxone and Zithromax.  Follow strep and urinary antigen, Legionella antigen. Interstitial lung disease-continue Esbriet, started on Solu-Medrol as above HFpEF-not in exacerbation, continue Lasix, Lopressor Hyperlipidemia-continue Lipitor   DVT Prophylaxis-   Lovenox   AM Labs  Ordered, also please review Full Orders  Family Communication: Admission, patients condition and plan of care including tests being ordered have been discussed with the patient and who indicate  understanding and agree with the plan and Code Status.  Code Status: DNR  Admission status: Inpatient :The appropriate admission status for this patient is INPATIENT. Inpatient status is judged to be reasonable and necessary in order to provide the required intensity of service to ensure the patient's safety. The patient's presenting symptoms, physical exam findings, and initial radiographic and laboratory data in the context of their chronic comorbidities is felt to place them at high risk for further clinical deterioration. Furthermore, it is not anticipated that the patient will be medically stable for discharge from the hospital within 2 midnights of admission. The following factors support the admission status of inpatient.         * I certify that at the point of admission it is my clinical judgment that the patient will require inpatient hospital care spanning beyond 2 midnights from the point of admission due to high intensity of service, high risk for further deterioration and high frequency of surveillance required.*  Time spent in minutes : 60 minutes   Rand Etchison S Kaliann Coryell M.D

## 2021-06-19 NOTE — Progress Notes (Signed)
Pt refused bipap tonight.  Pt stated he has claustrophobia and can't tolerate a mask over his face.  RN aware.

## 2021-06-19 NOTE — ED Triage Notes (Signed)
"  Have chronic lung disease and use home oxygen at average of 6 L, I have been having trouble with getting increased shortness of breath with exertion going to the bathroom and have to increase oxygen between 8-12 liters when I get up" per pt.

## 2021-06-19 NOTE — Consult Note (Addendum)
NAME:  Garrett Peterson, MRN:  [id number], DOB:  04/27/1946, LOS: 0 ADMISSION DATE:  06/19/2021, CONSULTATION DATE:  06/19/21 REFERRING MD:  EDP, CHIEF COMPLAINT:  Shortness of breath   History of Present Illness:  Garrett Peterson is a 76 y.o. M with PMH significant for CAD, HFpEF, HTN, Anorectal Ca s/p sigmoid colectomy and ILD on  Tyvaso and Esbriet and follows with Dr. Chase Caller who presents to the ED with 6 days of acutely worsening dyspnea and hypoxia with exertion.   He is on 6-8L O2 at baseline, noted acutely worsening O2 saturations down to 70% and did not feel "quite right", though denies any significant coughing, fever, runny nose, chest pain, LE edema, diarrhea or known ill contacts.   In the ED, pt required 6-7L O2, CXR shows increased interstitial markings with interstitial PNA not exclueded.   Labs were significant for WBC 11k, BNP 130, pH 7.36 with normal renal function.  Covid-19 and influenza negative.      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   Significant Hospital Events: Including procedures, antibiotic start and stop dates in addition to other pertinent events   2/7 presented to ED with worsening hypoxia, PCCM consult, admit to [facility]  Interim History / Subjective:  Pt comfortable in the ED, no current complaints   Objective   Blood pressure (!) 145/71, pulse (!) 58, temperature 98.5 F (36.9 C), temperature source Oral, resp. rate 16, height _0  (1.753 m), weight 98.9 kg, SpO2 99 %.       No intake or output data in the 24 hours ending 06/19/21 1513 Filed Weights   06/19/21 1016  06/19/21 1027  Weight: 99.8 kg 98.9 kg   General:  well-nourished elderly M, resting in bed in no acute distress HEENT: MM pink/moist, sclera anicteric, pupils equal  Neuro: awake, alert, oriented x3 without focal deficits CV: s1s2 rrr, no m/r/g PULM:  mildly decreased air entry in bilateral bases without wheezing or rhonchi, no accessory muscle use GI: soft, bsx4 active  Extremities: warm/dry, no edema  Skin: no rashes or lesions   Resolved Hospital Problem list     Assessment & Plan:   Acute on Chronic Hypoxic Respiratory Failure in the setting of ILD Consider acute viral infection vs CAP vs ILD flare Consider PE or acute CHF exacerbation though less likely  -admit, start Ceftriaxone and Azithromycin for CAP coverage -extended viral panel  -check urine strep and legionella -LDH and D-dimer, spoke with Dr. Chase Caller and  consider CTA chest and HRCT -check troponin and lactic acid -start Solumedrol 74m bid -elevated pCO2 on VBG, likely secondary to worsening ILD, trial Bipap overnight  -thank you for this consult, PCCM will continue to follow with you      Best Practice (right click and "Reselect all SmartList Selections" daily)   Per primary  Labs   CBC: Recent Labs  Lab 06/19/21 1156  WBC 11.0*  HGB 12.9*  HCT 38.2*  MCV 99.0  PLT 1431   Basic Metabolic Panel: Recent Labs  Lab 06/19/21 1156  NA 139  K 3.5  CL 97*  CO2 35*  GLUCOSE 141*  BUN 10  CREATININE 0.77  CALCIUM 8.7*  GFR: Estimated Creatinine Clearance: 89.7 mL/min (by C-G formula based on SCr of 0.77 mg/dL). Recent Labs  Lab 06/19/21 1156  WBC 11.0*    Liver Function Tests: Recent Labs  Lab 06/19/21 1156  AST 26  ALT 25  ALKPHOS 126  BILITOT 0.5  PROT 7.5  ALBUMIN 3.7   No results for input(s): LIPASE, AMYLASE in the last 168 hours. No results for input(s): AMMONIA in the last 168 hours.  ABG    Component Value Date/Time   HCO3 34.4 (H) 06/19/2021 1156   O2SAT 73.8  06/19/2021 1156     Coagulation Profile: No results for input(s): INR, PROTIME in the last 168 hours.  Cardiac Enzymes: No results for input(s): CKTOTAL, CKMB, CKMBINDEX, TROPONINI in the last 168 hours.  HbA1C: No results found for: HGBA1C  CBG: No results for input(s): GLUCAP in the last 168 hours.  Review of Systems:   Please see the history of present illness. 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   Surgical History:   Past Surgical History:  Procedure Laterality Date   APPENDECTOMY     BRONCHIAL WASHINGS  05/18/2020   Procedure: BRONCHIAL WASHINGS;  Surgeon: Margaretha Seeds, MD;  Location: [facility];  Service: Pulmonary;;   CARDIAC CATHETERIZATION     PT DENIES   COLONOSCOPY     2011,2015,2021   COLONOSCOPY WITH PROPOFOL N/A 07/10/2020   Procedure: COLONOSCOPY WITH PROPOFOL;  Surgeon: Toledo, Benay Pike, MD;  Location: [facility];  Service: Gastroenterology;  Laterality: N/A;   CORONARY ARTERY BYPASS GRAFT     FLEXIBLE BRONCHOSCOPY N/A 04/24/2020   Procedure: FLEXIBLE BRONCHOSCOPY;  Surgeon: Ottie Glazier, MD;  Location: [facility];  Service: Thoracic;  Laterality: N/A;   IR SINUS/FIST TUBE CHK-NON GI  10/27/2019   open heart surgery     PORTACATH PLACEMENT Right 10/13/2019   Procedure: INSERTION PORT-A-CATH;  Surgeon: Herbert Pun, MD;  Location: [facility];  Service: General;  Laterality: Right;   RIGHT/LEFT HEART CATH AND CORONARY ANGIOGRAPHY N/A 10/03/2020   Procedure: RIGHT/LEFT HEART CATH AND CORONARY ANGIOGRAPHY;  Surgeon:  Nelva Bush, MD;  Location: [facility];  Service: Cardiovascular;  Laterality: N/A;   ROBOT ASSISTED LAPAROSCOPIC PARTIAL COLECTOMY  09/2019   VIDEO BRONCHOSCOPY N/A 05/18/2020   Procedure: VIDEO BRONCHOSCOPY WITHOUT FLUORO;  Surgeon: Margaretha Seeds, MD;  Location: [facility];  Service: Pulmonary;  Laterality: N/A;     Social History:   reports that he has never smoked. He has quit using smokeless tobacco.  His smokeless tobacco use included chew. He reports current alcohol use. He reports that he does not use drugs.   Family History:  His family history includes Lung disease in his father.   Allergies Allergies  Allergen Reactions   Ofev [Nintedanib] Diarrhea and Nausea Only     Home Medications  Prior to Admission medications   Medication Sig Start Date End Date Taking? Authorizing Provider  aspirin EC 81 MG tablet Take 1 tablet (81 mg total) by mouth daily. Swallow whole. 09/27/20   End, Harrell Gave, MD  atorvastatin (LIPITOR) 20 MG tablet Take 20 mg by mouth daily.  [provider]  furosemide (LASIX) 40 MG tablet TAKE 1 TABLET BY MOUTH ONCE DAILY 03/19/21   End, Harrell Gave, MD  metoprolol tartrate (LOPRESSOR) 25 MG tablet Take 25 mg by mouth 2 (two) times daily. 11/03/19   [provider]  Pirfenidone (ESBRIET) 267 MG TABS Take 2 tablets (534 mg total) by mouth in the morning, at noon, and at bedtime. 05/15/21   Brand Males, MD  predniSONE (DELTASONE) 10 MG tablet Take 1 tablet (10 mg total) by mouth daily with breakfast. 04/27/21   Brand Males, MD  predniSONE (DELTASONE) 5 MG tablet Take 1 tablet (5 mg total) by mouth daily with breakfast. 04/27/21   Brand Males, MD  temazepam (RESTORIL) 30 MG capsule Take 30 mg by mouth at bedtime.  06/09/14   [provider]  Treprostinil (TYVASO) 0.6 MG/ML SOLN Inhale into the lungs 4 (four) times daily.    [provider]     Critical care time: n/a     Otilio Carpen Red Mandt,  PA-C [facility] See Amion for pager If no response to pager , please call [phone] until 7pm After 7:00 pm call Elink  [phone]?[place]

## 2021-06-19 NOTE — ED Provider Notes (Signed)
[facility] DEPT Provider Note   CSN: [id number] Arrival date & time: 06/19/21  2706     History  Chief Complaint  Patient presents with   Shortness of Breath    Garrett Peterson is a 76 y.o. male.  HPI 76 year old male history of interstitial lung disease, idiopathic pulmonary fibrosis, managed by Dr. Chase Caller who presents today with increased dyspnea.  He reports that he is chronically on 6 to 8 L of oxygen via nasal cannula.  He has required 8 L recently.  Normally when he gets up and exerts himself he gets dyspneic and sats drop to the 80s.  He reports over the past week sats have been dropping into the 70s.  He reports no change in his cough or production of sputum.  He has not noted any fever or chills.  Symptoms are mainly only with his exertion.  He denies any chest pain.  Patient reports no recent changes in his medications.  He reports that he is on baseline prednisone 5 mg.  He has been up to 10 mg in the past month for a short burst.  He denies peripheral edema, DVT, or history of PE.  He reports having all of his COVID stations including booster last fall.  He has not had any known exposures to any illnesses    Home Medications Prior to Admission medications   Medication Sig Start Date End Date Taking? Authorizing Provider  aspirin EC 81 MG tablet Take 1 tablet (81 mg total) by mouth daily. Swallow whole. 09/27/20  Yes End, Harrell Gave, MD  atorvastatin (LIPITOR) 20 MG tablet Take 20 mg by mouth daily.   Yes [provider]  furosemide (LASIX) 40 MG tablet TAKE 1 TABLET BY MOUTH ONCE DAILY 03/19/21  Yes End, Harrell Gave, MD  metoprolol tartrate (LOPRESSOR) 25 MG tablet Take 25 mg by mouth 2 (two) times daily. 11/03/19  Yes [provider]  Pirfenidone (ESBRIET) 267 MG TABS Take 2 tablets (534 mg total) by mouth in the morning, at noon, and at bedtime. 05/15/21  Yes Brand Males, MD  predniSONE (DELTASONE) 5 MG tablet Take 1  tablet (5 mg total) by mouth daily with breakfast. 04/27/21  Yes Brand Males, MD  temazepam (RESTORIL) 30 MG capsule Take 30 mg by mouth at bedtime.  06/09/14  Yes [provider]  Treprostinil (TYVASO) 0.6 MG/ML SOLN Inhale into the lungs 4 (four) times daily.   Yes [provider]  predniSONE (DELTASONE) 10 MG tablet Take 1 tablet (10 mg total) by mouth daily with breakfast. Patient not taking: Reported on 06/19/2021 04/27/21   Brand Males, MD      Allergies    Ofev [nintedanib]    Review of Systems   Review of Systems  All other systems reviewed and are negative.  Physical Exam Updated Vital Signs BP 139/66    Pulse 81    Temp 97.7 F (36.5 C)    Resp (!) 24    Ht 1.753 m (_0 )    Wt 98.9 kg    SpO2 98%    BMI 32.19 kg/m  Physical Exam Vitals and nursing note reviewed.  Constitutional:      General: He is not in acute distress.    Appearance: He is well-developed. He is obese. He is not ill-appearing.  HENT:     Head: Normocephalic.     Mouth/Throat:     Mouth: Mucous membranes are moist.  Eyes:     Pupils: Pupils  are equal, round, and reactive to light.  Cardiovascular:     Rate and Rhythm: Normal rate and regular rhythm.  Pulmonary:     Effort: Pulmonary effort is normal.     Breath sounds: Examination of the left-upper field reveals rhonchi. Rhonchi present. No decreased breath sounds.  Abdominal:     General: Bowel sounds are normal.     Palpations: Abdomen is soft.  Musculoskeletal:     Cervical back: Normal range of motion.     Comments: Trace edema bilaterally No cords or erythema noted  Skin:    General: Skin is warm and dry.     Capillary Refill: Capillary refill takes less than 2 seconds.  Neurological:     General: No focal deficit present.     Mental Status: He is alert.  Psychiatric:        Mood and Affect: Mood normal.    ED Results / Procedures / Treatments   Labs (all labs ordered are listed, but only abnormal  results are displayed) Labs Reviewed  CBC - Abnormal; Notable for the following components:      Result Value   WBC 11.0 (*)    RBC 3.86 (*)    Hemoglobin 12.9 (*)    HCT 38.2 (*)    All other components within normal limits  COMPREHENSIVE METABOLIC PANEL - Abnormal; Notable for the following components:   Chloride 97 (*)    CO2 35 (*)    Glucose, Bld 141 (*)    Calcium 8.7 (*)    All other components within normal limits  BLOOD GAS, VENOUS - Abnormal; Notable for the following components:   pCO2, Ven 61.4 (*)    Bicarbonate 34.4 (*)    Acid-Base Excess 7.4 (*)    All other components within normal limits  BRAIN NATRIURETIC PEPTIDE - Abnormal; Notable for the following components:   B Natriuretic Peptide 130.7 (*)    All other components within normal limits  LACTATE DEHYDROGENASE - Abnormal; Notable for the following components:   LDH 229 (*)    All other components within normal limits  C-REACTIVE PROTEIN - Abnormal; Notable for the following components:   CRP 1.6 (*)    All other components within normal limits  D-DIMER, QUANTITATIVE - Abnormal; Notable for the following components:   D-Dimer, Quant 0.89 (*)    All other components within normal limits  CK - Abnormal; Notable for the following components:   Total CK 36 (*)    All other components within normal limits  CBC - Abnormal; Notable for the following components:   RBC 4.07 (*)    All other components within normal limits  CBC - Abnormal; Notable for the following components:   WBC 11.4 (*)    RBC 3.97 (*)    HCT 38.4 (*)    All other components within normal limits  COMPREHENSIVE METABOLIC PANEL - Abnormal; Notable for the following components:   Chloride 92 (*)    Glucose, Bld 171 (*)    Anion gap 16 (*)    All other components within normal limits  RESP PANEL BY RT-PCR (FLU A&B, COVID) ARPGX2  RESPIRATORY PANEL BY PCR  MRSA NEXT GEN BY PCR, NASAL  PROCALCITONIN  STREP PNEUMONIAE URINARY ANTIGEN   LACTIC ACID, PLASMA  CREATININE, SERUM  LEGIONELLA PNEUMOPHILA SEROGP 1 UR AG  ALDOLASE    EKG EKG Interpretation  Date/Time:  Tuesday June 19 2021 10:25:06 EST Ventricular Rate:  55 PR Interval:  166 QRS  Duration: 76 QT Interval:  452 QTC Calculation: 432 R Axis:   24 Text Interpretation: Sinus bradycardia Cannot rule out Anterior infarct , age undetermined Abnormal ECG When compared with ECG of 03-Oct-2020 09:14, PREVIOUS ECG IS PRESENT Confirmed by Pattricia Boss [phone]) on 06/19/2021 1:16:11 PM  Radiology CT Angio Chest Pulmonary Embolism (PE) W or WO Contrast  Result Date: 06/20/2021 CLINICAL DATA:  Shortness of breath and left chest soreness. Elevated D-dimer. EXAM: CT ANGIOGRAPHY CHEST WITH CONTRAST TECHNIQUE: Multidetector CT imaging of the chest was performed using the standard protocol during bolus administration of intravenous contrast. Multiplanar CT image reconstructions and MIPs were obtained to evaluate the vascular anatomy. RADIATION DOSE REDUCTION: This exam was performed according to the departmental dose-optimization program which includes automated exposure control, adjustment of the mA and/or kV according to patient size and/or use of iterative reconstruction technique. CONTRAST:  45m OMNIPAQUE IOHEXOL 350 MG/ML SOLN COMPARISON:  Portable chest obtained earlier today. Chest CT dated 03/23/2021. FINDINGS: Cardiovascular: Stable enlarged heart. Stable post CABG changes. Atheromatous calcifications, including the coronary arteries and aorta. Normally opacified pulmonary arteries with no pulmonary arterial filling defects seen. Mediastinum/Nodes: No enlarged mediastinal, hilar, or axillary lymph nodes. Thyroid gland, trachea, and esophagus demonstrate no significant findings. Lungs/Pleura: No significant change in previously demonstrated severe chronic interstitial lung disease and bronchiectasis. Mild posterior right upper lobe paraseptal emphysematous changes. No  superimposed acute process. No pleural fluid. Upper Abdomen: Unremarkable. Musculoskeletal: Thoracic and lower cervical spine degenerative changes. Review of the MIP images confirms the above findings. IMPRESSION: 1. No pulmonary emboli or other acute abnormality. 2. Stable cardiomegaly and post CABG changes. 3. Stable severe chronic interstitial lung disease, bronchiectasis and paraseptal emphysema. 4. Dense calcific coronary artery and aortic atherosclerosis. Aortic Atherosclerosis (ICD10-I70.0) and Emphysema (ICD10-J43.9). Electronically Signed   By: SClaudie ReveringM.D.   On: 06/20/2021 10:53   DG Chest Port 1 View  Result Date: 06/19/2021 CLINICAL DATA:  Shortness of breath, hypoxia EXAM: PORTABLE CHEST 1 VIEW COMPARISON:  Previous studies including the radiographs done on 05/16/2020 and CT done on 03/22/2021 FINDINGS: Transverse diameter of heart is increased. Increased interstitial markings are seen in the mid and lower lung fields. Interstitial fibrosis was noted in the CT chest done on 03/22/2021. Upper lung fields are unremarkable. There is poor inspiration. There is no significant pleural effusion or pneumothorax. Tip of central venous catheter is seen in the superior vena cava. 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 IMPRESSION: Increased interstitial markings are seen in the mid and lower lung fields suggesting pulmonary fibrosis. Possibility of superimposed interstitial pneumonia is not excluded. Electronically Signed   By: PElmer PickerM.D.   On: 06/19/2021 11:55    Procedures Procedures    Medications Ordered in ED Medications  cefTRIAXone (ROCEPHIN) 1 g in sodium chloride 0.9 % 100 mL IVPB (0 g Intravenous Stopped 06/19/21 1759)  azithromycin (ZITHROMAX) 500 mg in sodium chloride 0.9 % 250 mL IVPB (0 mg Intravenous Stopped 06/19/21 1851)  methylPREDNISolone sodium succinate (SOLU-MEDROL) 40 mg/mL injection 40 mg (40 mg Intravenous Given 06/20/21 1143)  aspirin EC tablet 81 mg (81 mg  Oral Given 06/20/21 1052)  atorvastatin (LIPITOR) tablet 20 mg (20 mg Oral Given 06/20/21 1053)  furosemide (LASIX) tablet 40 mg (40 mg Oral Given 06/20/21 1054)  metoprolol tartrate (LOPRESSOR) tablet 25 mg (25 mg Oral Given 06/20/21 1054)  Pirfenidone TABS 534 mg (0 mg Oral Hold 06/20/21 1102)  enoxaparin (LOVENOX) injection 40 mg (40 mg Subcutaneous Given 06/19/21 2131)  sodium chloride flush (NS) 0.9 % injection 3 mL ( Intravenous Not Given 06/20/21 1102)  sodium chloride flush (NS) 0.9 % injection 3 mL (has no administration in time range)  0.9 %  sodium chloride infusion (has no administration in time range)  acetaminophen (TYLENOL) tablet 650 mg (has no administration in time range)    Or  acetaminophen (TYLENOL) suppository 650 mg (has no administration in time range)  ondansetron (ZOFRAN) tablet 4 mg (has no administration in time range)    Or  ondansetron (ZOFRAN) injection 4 mg (has no administration in time range)  ipratropium-albuterol (DUONEB) 0.5-2.5 (3) MG/3ML nebulizer solution 3 mL (3 mLs Nebulization Given 06/20/21 1055)  methylPREDNISolone sodium succinate (SOLU-MEDROL) 125 mg/2 mL injection 125 mg (125 mg Intravenous Given 06/19/21 1449)  temazepam (RESTORIL) capsule 30 mg (30 mg Oral Given 06/20/21 0052)  iohexol (OMNIPAQUE) 350 MG/ML injection 100 mL (80 mLs Intravenous Contrast Given 06/20/21 1023)    ED Course/ Medical Decision Making/ A&P Clinical Course as of 06/20/21 1216  Tue Jun 19, 2021  1314 COVID test, influenza test, reviewed, interpreted, negative CBC reviewed with mild leukocytosis at 11,000 Hemoglobin normal at 12.9 VBG reviewed and interpreted with pH of 7.36 PCO2 of 61, PO2 of 42 BNP slightly elevated at 130 C-Met reviewed with CO2 35, which appears stable from first prior of 36 [DR]  1410 Chest x-Duy Lemming reviewed with increased interstitial markings bilateral mid and lower lung fields  [DR]  1412 Care discussed with Marni Griffon, NP, critical care will consult. [DR]   1614 Reviewed critical care note Ceftriaxone and Zithromax started for CAP Critical care has ordered additional labs and viral panels  [DR]    Clinical Course User Index [DR] Pattricia Boss, MD                           Medical Decision Making Medically ill man with severe lung disease and oxygen dependence presents today complaining of increased dyspnea on exertion.  Here in the ED he has some increased markings on his chest x-Careem Yasui.  Mild leukocytosis.  He is given a dose of Solu-Medrol.  Plan discussion with pulmonary as patient is followed by Dr. Chase Caller for input  Critical care saw and evaluated patient.  They advised Rocephin and Zithromax to start although the doubt infection. I reviewed their documentation and Discussed via chat with Dr. Erin Fulling Reviewed pulmonary input Discussed with Dr. Olevia Bowens, on call for hospitalist. He will see for admission  Amount and/or Complexity of Data Reviewed Independent Historian:     Details: Grandson, at bedside, gives additional details regarding history and recent changes. External Data Reviewed: radiology and notes.    Details: Reviewed outpatient notes from Dr. Chase Caller Labs: ordered. Decision-making details documented in ED Course. Radiology: ordered and independent interpretation performed. Decision-making details documented in ED Course. ECG/medicine tests: ordered and independent interpretation performed. Decision-making details documented in ED Course. Discussion of management or test interpretation with external provider(s): Discussed care with the alcoholic NP, on-call for critical care Discussed care with Dr. Erin Fulling, on-call for critical care  Risk Prescription drug management. Decision regarding hospitalization.  Critical Care Total time providing critical care: < 30 minutes          Final Clinical Impression(s) / ED Diagnoses Final diagnoses:  Shortness of breath  Interstitial lung disease ([place])  Hypoxia    Rx /  DC Orders ED Discharge Orders     None  Pattricia Boss, MD 06/20/21 [phone]

## 2021-06-19 NOTE — ED Notes (Signed)
MRSA swab completed and sent to lab

## 2021-06-19 NOTE — ED Notes (Signed)
EKG given to Dr Ashok Cordia

## 2021-06-20 ENCOUNTER — Encounter (HOSPITAL_COMMUNITY): Payer: Self-pay | Admitting: Internal Medicine

## 2021-06-20 ENCOUNTER — Inpatient Hospital Stay (HOSPITAL_COMMUNITY): Payer: Medicare Other

## 2021-06-20 DIAGNOSIS — Z66 Do not resuscitate: Secondary | ICD-10-CM

## 2021-06-20 DIAGNOSIS — I5032 Chronic diastolic (congestive) heart failure: Secondary | ICD-10-CM | POA: Diagnosis present

## 2021-06-20 DIAGNOSIS — I10 Essential (primary) hypertension: Secondary | ICD-10-CM

## 2021-06-20 DIAGNOSIS — Z87891 Personal history of nicotine dependence: Secondary | ICD-10-CM | POA: Diagnosis not present

## 2021-06-20 DIAGNOSIS — J9601 Acute respiratory failure with hypoxia: Secondary | ICD-10-CM | POA: Diagnosis present

## 2021-06-20 DIAGNOSIS — Z515 Encounter for palliative care: Secondary | ICD-10-CM

## 2021-06-20 DIAGNOSIS — Z7189 Other specified counseling: Secondary | ICD-10-CM

## 2021-06-20 DIAGNOSIS — Z79899 Other long term (current) drug therapy: Secondary | ICD-10-CM | POA: Diagnosis not present

## 2021-06-20 DIAGNOSIS — Z9049 Acquired absence of other specified parts of digestive tract: Secondary | ICD-10-CM | POA: Diagnosis not present

## 2021-06-20 DIAGNOSIS — J849 Interstitial pulmonary disease, unspecified: Secondary | ICD-10-CM | POA: Diagnosis not present

## 2021-06-20 DIAGNOSIS — I272 Pulmonary hypertension, unspecified: Secondary | ICD-10-CM | POA: Diagnosis present

## 2021-06-20 DIAGNOSIS — M1990 Unspecified osteoarthritis, unspecified site: Secondary | ICD-10-CM | POA: Diagnosis present

## 2021-06-20 DIAGNOSIS — I2510 Atherosclerotic heart disease of native coronary artery without angina pectoris: Secondary | ICD-10-CM | POA: Diagnosis present

## 2021-06-20 DIAGNOSIS — Z6832 Body mass index (BMI) 32.0-32.9, adult: Secondary | ICD-10-CM | POA: Diagnosis not present

## 2021-06-20 DIAGNOSIS — J84112 Idiopathic pulmonary fibrosis: Secondary | ICD-10-CM | POA: Diagnosis present

## 2021-06-20 DIAGNOSIS — Z9981 Dependence on supplemental oxygen: Secondary | ICD-10-CM | POA: Diagnosis not present

## 2021-06-20 DIAGNOSIS — E669 Obesity, unspecified: Secondary | ICD-10-CM | POA: Diagnosis present

## 2021-06-20 DIAGNOSIS — Z9221 Personal history of antineoplastic chemotherapy: Secondary | ICD-10-CM | POA: Diagnosis not present

## 2021-06-20 DIAGNOSIS — Z85048 Personal history of other malignant neoplasm of rectum, rectosigmoid junction, and anus: Secondary | ICD-10-CM | POA: Diagnosis not present

## 2021-06-20 DIAGNOSIS — J189 Pneumonia, unspecified organism: Secondary | ICD-10-CM | POA: Diagnosis present

## 2021-06-20 DIAGNOSIS — Z951 Presence of aortocoronary bypass graft: Secondary | ICD-10-CM | POA: Diagnosis not present

## 2021-06-20 DIAGNOSIS — Z85828 Personal history of other malignant neoplasm of skin: Secondary | ICD-10-CM | POA: Diagnosis not present

## 2021-06-20 DIAGNOSIS — Z20822 Contact with and (suspected) exposure to covid-19: Secondary | ICD-10-CM | POA: Diagnosis present

## 2021-06-20 DIAGNOSIS — I110 Hypertensive heart disease with heart failure: Secondary | ICD-10-CM | POA: Diagnosis present

## 2021-06-20 DIAGNOSIS — Z888 Allergy status to other drugs, medicaments and biological substances status: Secondary | ICD-10-CM | POA: Diagnosis not present

## 2021-06-20 DIAGNOSIS — J9621 Acute and chronic respiratory failure with hypoxia: Secondary | ICD-10-CM | POA: Diagnosis present

## 2021-06-20 DIAGNOSIS — E785 Hyperlipidemia, unspecified: Secondary | ICD-10-CM | POA: Diagnosis present

## 2021-06-20 DIAGNOSIS — Z7982 Long term (current) use of aspirin: Secondary | ICD-10-CM | POA: Diagnosis not present

## 2021-06-20 DIAGNOSIS — Z7952 Long term (current) use of systemic steroids: Secondary | ICD-10-CM | POA: Diagnosis not present

## 2021-06-20 DIAGNOSIS — R0602 Shortness of breath: Secondary | ICD-10-CM | POA: Diagnosis present

## 2021-06-20 LAB — COMPREHENSIVE METABOLIC PANEL
ALT: 25 U/L (ref 0–44)
AST: 24 U/L (ref 15–41)
Albumin: 3.6 g/dL (ref 3.5–5.0)
Alkaline Phosphatase: 126 U/L (ref 38–126)
Anion gap: 16 — ABNORMAL HIGH (ref 5–15)
BUN: 13 mg/dL (ref 8–23)
CO2: 31 mmol/L (ref 22–32)
Calcium: 9.8 mg/dL (ref 8.9–10.3)
Chloride: 92 mmol/L — ABNORMAL LOW (ref 98–111)
Creatinine, Ser: 0.75 mg/dL (ref 0.61–1.24)
GFR, Estimated: >60 mL/min (ref >60)
Glucose, Bld: 171 mg/dL — ABNORMAL HIGH (ref 70–99)
Potassium: 3.9 mmol/L (ref 3.5–5.1)
Sodium: 139 mmol/L (ref 135–145)
Total Bilirubin: 0.4 mg/dL (ref 0.3–1.2)
Total Protein: 7.3 g/dL (ref 6.5–8.1)

## 2021-06-20 LAB — LEGIONELLA PNEUMOPHILA SEROGP 1 UR AG: L. pneumophila Serogp 1 Ur Ag: NEGATIVE

## 2021-06-20 LAB — CBC
HCT: 38.4 % — ABNORMAL LOW (ref 39.0–52.0)
Hemoglobin: 13.5 g/dL (ref 13.0–17.0)
MCH: 34 pg (ref 26.0–34.0)
MCHC: 35.2 g/dL (ref 30.0–36.0)
MCV: 96.7 fL (ref 80.0–100.0)
Platelets: 196 10*3/uL (ref 150–400)
RBC: 3.97 MIL/uL — ABNORMAL LOW (ref 4.22–5.81)
RDW: 12.3 % (ref 11.5–15.5)
WBC: 11.4 10*3/uL — ABNORMAL HIGH (ref 4.0–10.5)
nRBC: 0 % (ref 0.0–0.2)

## 2021-06-20 LAB — STREP PNEUMONIAE URINARY ANTIGEN: Strep Pneumo Urinary Antigen: NEGATIVE

## 2021-06-20 LAB — ALDOLASE: Aldolase: 5.3 U/L (ref 3.3–10.3)

## 2021-06-20 MED ORDER — IOHEXOL 350 MG/ML SOLN
100.0000 mL | Freq: Once | INTRAVENOUS | Status: AC | PRN
  Administered 2021-06-20: 80 mL via INTRAVENOUS

## 2021-06-20 MED ORDER — TEMAZEPAM 7.5 MG PO CAPS
7.5000 mg | ORAL_CAPSULE | Freq: Every evening | ORAL | Status: DC | PRN
  Administered 2021-06-20: 7.5 mg via ORAL
  Filled 2021-06-20 (×3): qty 1

## 2021-06-20 MED ORDER — CHLORHEXIDINE GLUCONATE CLOTH 2 % EX PADS
6.0000 | MEDICATED_PAD | Freq: Every day | CUTANEOUS | Status: DC
  Administered 2021-06-20 – 2021-06-21 (×2): 6 via TOPICAL

## 2021-06-20 MED ORDER — SODIUM CHLORIDE 0.9% FLUSH: 10.0000 mL | INTRAVENOUS | Status: DC | PRN

## 2021-06-20 MED ORDER — SODIUM CHLORIDE (PF) 0.9 % IJ SOLN
INTRAMUSCULAR | Status: AC
  Filled 2021-06-20: qty 50

## 2021-06-20 MED ORDER — IPRATROPIUM-ALBUTEROL 0.5-2.5 (3) MG/3ML IN SOLN
3.0000 mL | Freq: Three times a day (TID) | RESPIRATORY_TRACT | Status: DC
  Administered 2021-06-21: 3 mL via RESPIRATORY_TRACT
  Filled 2021-06-20: qty 3

## 2021-06-20 MED ORDER — FUROSEMIDE 10 MG/ML IJ SOLN
40.0000 mg | Freq: Once | INTRAMUSCULAR | Status: AC
  Administered 2021-06-20: 40 mg via INTRAVENOUS
  Filled 2021-06-20: qty 4

## 2021-06-20 NOTE — Progress Notes (Signed)
RN Notified

## 2021-06-20 NOTE — Progress Notes (Signed)
[facility] PROGRESS NOTE    Progress Note  Garrett Peterson  EGB:[id number] DOB: [birth year] DOA: 06/19/2021 PCP: Albina Billet, MD     Brief Narrative:   Garrett Peterson is an 76 y.o. male past medical history of interstitial lung disease on 8 L of oxygen via nasal cannula, preserved EF heart failure, essential hypertension rectal cancer status post sigmoid colectomy comes into the hospital for shortness of breath that is worsening, he started noticing last Wednesday was he was becoming hypoxic with ambulation.  Denies any cough or fevers, chest x-ray in the ED showed pulmonary fibrosis and possible superimposed interstitial edema.  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 Urine antigens are pending. Procalcitonin is low yield, CRP is 1.6  Chronic diastolic heart failure Stable continue Lasix and Lopressor.  Hyperlipidemia Statins.  DVT prophylaxis: LOVENOX Family Communication:NON Status is: Inpatient status The patient will require care spanning > 2 midnights and should be moved to inpatient because: Acute respiratory failure with hypoxia possibly due to infectious etiology versus UIP flare started on steroids and antibiotics will transition to inpatient      Code Status:     Code Status Orders  (From admission, onward)           Start     Ordered   06/19/21 1802  Do not attempt resuscitation (DNR)  Continuous       Question Answer Comment  In the event of cardiac or respiratory ARREST Do not call a code blue   In the event of cardiac or respiratory ARREST Do not perform Intubation, CPR, defibrillation or ACLS   In the event of cardiac or respiratory ARREST Use  medication by any route, position, wound care, and other measures to relive pain and suffering. May use oxygen, suction and manual treatment of airway obstruction as needed for comfort.      06/19/21 1801           Code Status History     Date Active Date Inactive Code Status Order ID Comments User Context   05/16/2020 2211 05/19/2020 2356 Full Code [id number]  Jean Rosenthal, MD ED   03/03/2020 1452 03/04/2020 1840 Full Code [id number]  Ivor Costa, MD ED   09/20/2019 2100 09/25/2019 0015 Full Code [id number]  Herbert Pun, MD Inpatient         IV Access:   Peripheral IV   Procedures and diagnostic studies:   DG Chest Port 1 View  Result Date: 06/19/2021 CLINICAL DATA:  Shortness of breath, hypoxia EXAM: PORTABLE CHEST 1 VIEW COMPARISON:  Previous studies including the radiographs done on 05/16/2020 and CT done on 03/22/2021 FINDINGS: Transverse diameter of heart is increased. Increased interstitial markings are seen in the mid and lower lung fields. Interstitial fibrosis was noted in the CT chest done on 03/22/2021. Upper lung fields are unremarkable. There is poor inspiration. There is no significant pleural effusion or pneumothorax. Tip of central venous catheter is seen in the superior vena cava. 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 IMPRESSION: Increased interstitial markings are seen in the mid and lower lung fields suggesting pulmonary fibrosis. Possibility of superimposed interstitial pneumonia is not excluded. Electronically Signed   By: Elmer Picker M.D.   On: 06/19/2021 11:55     Medical Consultants:  None.   Subjective:    Garrett Peterson relates his breathing is about the same still short of breath when he tries to get up.  Objective:    Vitals:   06/20/21 0200 06/20/21 0210 06/20/21 0400 06/20/21 0726  BP: 131/65  125/62 138/82  Pulse: 67 62 (!) 58 69  Resp: (!) _0 (!) 22  Temp:      TempSrc:      SpO2: 91% 92% 99% 100%  Weight:       Height:       SpO2: 100 % O2 Flow Rate (L/min): 8 L/min   Intake/Output Summary (Last 24 hours) at 06/20/2021 0737 Last data filed at 06/19/2021 1851 Gross per 24 hour  Intake 349 ml  Output --  Net 349 ml   Filed Weights   06/19/21 1016 06/19/21 1027  Weight: 99.8 kg 98.9 kg    Exam: General exam: In no acute distress. Respiratory system: Good air movement and clear to auscultation. Cardiovascular system: S1 & S2 heard, RRR. No JVD. Gastrointestinal system: Abdomen is nondistended, soft and nontender.  Extremities: No pedal edema. Skin: No rashes, lesions or ulcers Psychiatry: Judgement and insight appear normal. Mood & affect appropriate.    Data Reviewed:    Labs: Basic Metabolic Panel: Recent Labs  Lab 06/19/21 1156 06/19/21 1703 06/20/21 0543  NA 139  --  139  K 3.5  --  3.9  CL 97*  --  92*  CO2 35*  --  31  GLUCOSE 141*  --  171*  BUN 10  --  13  CREATININE 0.77 0.83 0.75  CALCIUM 8.7*  --  9.8   GFR Estimated Creatinine Clearance: 89.7 mL/min (by C-G formula based on SCr of 0.75 mg/dL). Liver Function Tests: Recent Labs  Lab 06/19/21 1156 06/20/21 0543  AST 26 24  ALT 25 25  ALKPHOS 126 126  BILITOT 0.5 0.4  PROT 7.5 7.3  ALBUMIN 3.7 3.6   No results for input(s): LIPASE, AMYLASE in the last 168 hours. No results for input(s): AMMONIA in the last 168 hours. Coagulation profile No results for input(s): INR, PROTIME in the last 168 hours. COVID-19 Labs  Recent Labs    06/19/21 1450 06/19/21 1703  DDIMER  --  0.89*  LDH 229*  --   CRP  --  1.6*    Lab Results  Component Value Date   SARSCOV2NAA NEGATIVE 06/19/2021   [place] NEGATIVE 07/05/2020   SARSCOV2NAA NEGATIVE 05/16/2020   [place] NEGATIVE 04/20/2020    CBC: Recent Labs  Lab 06/19/21 1156 06/19/21 1703 06/20/21 0543  WBC 11.0* 9.7 11.4*  HGB 12.9* 13.5 13.5  HCT 38.2* 40.4 38.4*  MCV 99.0 99.3 96.7  PLT 177 187 196   Cardiac Enzymes: Recent Labs  Lab  06/19/21 1703  CKTOTAL 36*   BNP (last 3 results) Recent Labs    09/21/20 1005 03/21/21 1130  PROBNP 116.0* 141.0*   CBG: No results for input(s): GLUCAP in the last 168 hours. D-Dimer: Recent Labs    06/19/21 1703  DDIMER 0.89*   Hgb A1c: No results for input(s): HGBA1C in the last 72 hours. Lipid Profile: No results for input(s): CHOL, HDL, LDLCALC, TRIG, CHOLHDL, LDLDIRECT in the last 72 hours. Thyroid function studies: No results for input(s): TSH, T4TOTAL, T3FREE, THYROIDAB in the last 72 hours.  Invalid input(s): FREET3 Anemia work up: No results for input(s): VITAMINB12, FOLATE, FERRITIN, TIBC, IRON, RETICCTPCT in the last 72 hours. Sepsis Labs:  Recent Labs  Lab 06/19/21 1156 06/19/21 1450 06/19/21 1703 06/20/21 0543  PROCALCITON  --  <0.10  --   --   WBC 11.0*  --  9.7 11.4*  LATICACIDVEN  --   --  1.9  --    Microbiology Recent Results (from the past 240 hour(s))  Resp Panel by RT-PCR (Flu A&B, Covid) Nasopharyngeal Swab     Status: None   Collection Time: 06/19/21 11:56 AM   Specimen: Nasopharyngeal Swab; Nasopharyngeal(NP) swabs in vial transport medium  Result Value Ref Range Status   SARS Coronavirus 2 by RT PCR NEGATIVE NEGATIVE Final    Comment: (NOTE) SARS-CoV-2 target nucleic acids are NOT DETECTED.  The SARS-CoV-2 RNA is generally detectable in upper respiratory specimens during the acute phase of infection. The lowest concentration of SARS-CoV-2 viral copies this assay can detect is 138 copies/mL. A negative result does not preclude SARS-Cov-2 infection and should not be used as the sole basis for treatment or other patient management decisions. A negative result may occur with  improper specimen collection/handling, submission of specimen other than nasopharyngeal swab, presence of viral mutation(s) within the areas targeted by this assay, and inadequate number of viral copies(<138 copies/mL). A negative result must be combined  with clinical observations, patient history, and epidemiological information. The expected result is Negative.  Fact Sheet for Patients:  EntrepreneurPulse.com.au  Fact Sheet for Healthcare Providers:  IncredibleEmployment.be  This test is no t yet approved or cleared by the [place] FDA and  has been authorized for detection and/or diagnosis of SARS-CoV-2 by FDA under an Emergency Use Authorization (EUA). This EUA will remain  in effect (meaning this test can be used) for the duration of the COVID-19 declaration under Section 564(b)(1) of the Act, 21 U.S.C.section 360bbb-3(b)(1), unless the authorization is terminated  or revoked sooner.       Influenza A by PCR NEGATIVE NEGATIVE Final   Influenza B by PCR NEGATIVE NEGATIVE Final    Comment: (NOTE) The Xpert Xpress SARS-CoV-2/FLU/RSV plus assay is intended as an aid in the diagnosis of influenza from Nasopharyngeal swab specimens and should not be used as a sole basis for treatment. Nasal washings and aspirates are unacceptable for Xpert Xpress SARS-CoV-2/FLU/RSV testing.  Fact Sheet for Patients: EntrepreneurPulse.com.au  Fact Sheet for Healthcare Providers: IncredibleEmployment.be  This test is not yet approved or cleared by the [place] FDA and has been authorized for detection and/or diagnosis of SARS-CoV-2 by FDA under an Emergency Use Authorization (EUA). This EUA will remain in effect (meaning this test can be used) for the duration of the COVID-19 declaration under Section 564(b)(1) of the Act, 21 U.S.C. section 360bbb-3(b)(1), unless the authorization is terminated or revoked.  Performed at [facility], [place] [address], [place], [place] [address]   Respiratory (~20 pathogens) panel by PCR     Status: None   Collection Time: 06/19/21  2:50 PM   Specimen: Nasopharyngeal Swab; Respiratory  Result Value Ref Range  Status   Adenovirus NOT DETECTED NOT DETECTED Final   Coronavirus 229E NOT DETECTED NOT DETECTED Final    Comment: (NOTE) The Coronavirus on the Respiratory Panel, DOES NOT test for the novel  Coronavirus (2019 nCoV)    Coronavirus HKU1 NOT DETECTED NOT DETECTED Final   Coronavirus NL63 NOT DETECTED NOT DETECTED Final   Coronavirus OC43 NOT DETECTED NOT DETECTED Final   Metapneumovirus NOT DETECTED NOT DETECTED Final   Rhinovirus / Enterovirus NOT DETECTED NOT DETECTED Final   Influenza  A NOT DETECTED NOT DETECTED Final   Influenza B NOT DETECTED NOT DETECTED Final   Parainfluenza Virus 1 NOT DETECTED NOT DETECTED Final   Parainfluenza Virus 2 NOT DETECTED NOT DETECTED Final   Parainfluenza Virus 3 NOT DETECTED NOT DETECTED Final   Parainfluenza Virus 4 NOT DETECTED NOT DETECTED Final   Respiratory Syncytial Virus NOT DETECTED NOT DETECTED Final   Bordetella pertussis NOT DETECTED NOT DETECTED Final   Bordetella Parapertussis NOT DETECTED NOT DETECTED Final   Chlamydophila pneumoniae NOT DETECTED NOT DETECTED Final   Mycoplasma pneumoniae NOT DETECTED NOT DETECTED Final    Comment: Performed at [facility], [place] [address], [place], [place] [address]  MRSA Next Gen by PCR, Nasal     Status: None   Collection Time: 06/19/21  6:52 PM  Result Value Ref Range Status   MRSA by PCR Next Gen NOT DETECTED NOT DETECTED Final    Comment: (NOTE) The GeneXpert MRSA Assay (FDA approved for NASAL specimens only), is one component of a comprehensive MRSA colonization surveillance program. It is not intended to diagnose MRSA infection nor to guide or monitor treatment for MRSA infections. Test performance is not FDA approved in patients less than 39 years old. Performed at [facility], [place] [address] [place], [place] [address]      Medications:    aspirin EC  81 mg Oral Daily   atorvastatin  20 mg Oral Daily   enoxaparin (LOVENOX) injection  40 mg Subcutaneous  Q24H   furosemide  40 mg Oral Daily   ipratropium-albuterol  3 mL Nebulization Q6H   methylPREDNISolone (SOLU-MEDROL) injection  40 mg Intravenous Q12H   metoprolol tartrate  25 mg Oral BID   Pirfenidone  534 mg Oral TID   sodium chloride flush  3 mL Intravenous Q12H   Continuous Infusions:  sodium chloride     azithromycin Stopped (06/19/21 1851)   cefTRIAXone (ROCEPHIN)  IV Stopped (06/19/21 1759)      LOS: 0 days   Charlynne Cousins  [facility]  06/20/2021, 7:37 AM

## 2021-06-20 NOTE — Evaluation (Signed)
Physical Therapy Evaluation Patient Details Name: Garrett Peterson MRN: [id number] DOB: 1945-06-25 Today's Date: 06/20/2021  History of Present Illness  76 y.o. male past medical history of interstitial lung disease on 8 L of oxygen via nasal cannula, preserved EF heart failure, essential hypertension rectal cancer status post sigmoid colectomy comes into the hospital for shortness of breath that is worsening  Clinical Impression  Pt admitted with above diagnosis. Pt ambulated 85' without an assistive device, SaO2 72% on 6L O2 while walking, verbal cues for pursed lip breathing as pt tend to inhale through mouth. 93% on 6L O2 at rest. Pt notes he's not able to walk as far as he can at baseline. He stated he is on 6L O2 at home normally but increased to 8L recently.  Pt currently with functional limitations due to the deficits listed below (see PT Problem List). Pt will benefit from skilled PT to increase their independence and safety with mobility to allow discharge to the venue listed below.          Recommendations for follow up therapy are one component of a multi-disciplinary discharge planning process, led by the attending physician.  Recommendations may be updated based on patient status, additional functional criteria and insurance authorization.  Follow Up Recommendations Home health PT    Assistance Recommended at Discharge Set up Supervision/Assistance  Patient can return home with the following  A little help with bathing/dressing/bathroom;Assist for transportation;Help with stairs or ramp for entrance    Equipment Recommendations Rollator (4 wheels)  Recommendations for Other Services       Functional Status Assessment Patient has had a recent decline in their functional status and demonstrates the ability to make significant improvements in function in a reasonable and predictable amount of time.     Precautions / Restrictions Precautions Precautions: Other  (comment) Precaution Comments: monitor O2; denies falls in past 6 months Restrictions Weight Bearing Restrictions: No      Mobility  Bed Mobility               General bed mobility comments: up in recliner    Transfers Overall transfer level: Needs assistance Equipment used: None Transfers: Sit to/from Stand Sit to Stand: Supervision           General transfer comment: supervision for safety    Ambulation/Gait Ambulation/Gait assistance: Supervision Gait Distance (Feet): 45 Feet Assistive device: None Gait Pattern/deviations: Decreased stride length, Step-through pattern Gait velocity: WFL     General Gait Details: SaO2 72% on 6L O2 while walking, 93% on 6L at rest, mild unsteadiness x 1 due to sock sticking on floor, pt able to self correct balance, VCs for pursed lip breathing  Stairs            Wheelchair Mobility    Modified Rankin (Stroke Patients Only)       Balance Overall balance assessment: Mild deficits observed, not formally tested                                           Pertinent Vitals/Pain Pain Assessment Pain Assessment: No/denies pain    Home Living Family/patient expects to be discharged to:: Private residence Living Arrangements: Spouse/significant other Available Help at Discharge: Family;Available PRN/intermittently   Home Access: Stairs to enter   Entrance Stairs-Number of Steps: 2   Home Layout: One level Home Equipment: Conservation officer, nature (2 wheels);Shower  seat      Prior Function               Mobility Comments: on 6L O2 at home at baseline, recently went up to 8L, walks short household distances without an AD ADLs Comments: independent ADLs     Hand Dominance        Extremity/Trunk Assessment   Upper Extremity Assessment Upper Extremity Assessment: Overall WFL for tasks assessed    Lower Extremity Assessment Lower Extremity Assessment: Overall WFL for tasks assessed     Cervical / Trunk Assessment Cervical / Trunk Assessment: Normal  Communication   Communication: No difficulties  Cognition Arousal/Alertness: Awake/alert Behavior During Therapy: WFL for tasks assessed/performed Overall Cognitive Status: Within Functional Limits for tasks assessed                                          General Comments      Exercises     Assessment/Plan    PT Assessment Patient needs continued PT services  PT Problem List Cardiopulmonary status limiting activity;Decreased mobility;Decreased activity tolerance       PT Treatment Interventions Gait training;Therapeutic exercise;Therapeutic activities;Functional mobility training;Balance training    PT Goals (Current goals can be found in the Care Plan section)  Acute Rehab PT Goals Patient Stated Goal: likes to be outside PT Goal Formulation: With patient/family Time For Goal Achievement: 07/04/21 Potential to Achieve Goals: Good    Frequency Min 3X/week     Co-evaluation               AM-PAC PT "6 Clicks" Mobility  Outcome Measure Help needed turning from your back to your side while in a flat bed without using bedrails?: A Little Help needed moving from lying on your back to sitting on the side of a flat bed without using bedrails?: A Little Help needed moving to and from a bed to a chair (including a wheelchair)?: A Little Help needed standing up from a chair using your arms (e.g., wheelchair or bedside chair)?: A Little Help needed to walk in hospital room?: A Little Help needed climbing 3-5 steps with a railing? : A Little 6 Click Score: 18    End of Session Equipment Utilized During Treatment: Gait belt Activity Tolerance: Patient limited by fatigue Patient left: in chair;with call bell/phone within reach;with family/visitor present Nurse Communication: Mobility status PT Visit Diagnosis: Difficulty in walking, not elsewhere classified (R26.2)    Time:  6967-8938 PT Time Calculation (min) (ACUTE ONLY): 20 min   Charges:   PT Evaluation $PT Eval Moderate Complexity: 1 Mod         Philomena Doheny PT 06/20/2021  Acute Rehabilitation Services Pager [phone] Office [phone]

## 2021-06-20 NOTE — Consult Note (Signed)
Consultation Note Date: 06/20/2021   Patient Name: Garrett Peterson  DOB: 1946-03-28  MRN: [id number]  Age / Sex: 76 y.o., male  PCP: Albina Billet, MD Referring Physician: Charlynne Cousins, MD  Reason for Consultation: Establishing goals of care  HPI/Patient Profile: 76 y.o. male  with past medical history of CAD, HFpEF, pulmonary htn, HTN, Anorectal Ca s/p sigmoid colectomy and ILD on  Tyvaso and Esbriet  admitted on 06/19/2021 with worsening dyspnea and hypoxia. CXR showed increased interstitial markings with interstitial PNA not exclueded. Patient started on antibiotics and also on steroids for possible ILD flare. Patient has had decline in lung function over past year and now with concern of continued decline. PMT consulted to discuss [place].   Clinical Assessment and Goals of Care: I have reviewed medical records including EPIC notes, labs and imaging, assessed the patient and then met with patient to discuss diagnosis prognosis, GOC, EOL wishes, disposition and options.  I introduced Palliative Medicine as specialized medical care for people living with serious illness. It focuses on providing relief from the symptoms and stress of a serious illness. The goal is to improve quality of life for both the patient and the family.  We discussed a brief life review of the patient. He tells me he lives with his wife. Tells me he has been generally healthy most of his life so his condition over the past 2 years has been difficult to accept. He tells me about his cancer diagnosis and treatment many years ago.   As far as functional and nutritional status, he tells me his lung function limits him but he is still able to are for himself independently. He tells me of poor appetite for the past 4-5 months.    We discussed patient's current illness and what it means in the larger context of patient's on-going co-morbidities.  Natural disease trajectory and  expectations at EOL were discussed. We discussed worsening lung function. He shares with me conversations with his pulmonologist and he understands treatment options are limited.   I attempted to elicit values and goals of care important to the patient.  He tells me his focus on making arrangements so that his family does not have difficulty after he passes away. He tells his desire to focus on quality of life and make "whatever time is left" good time.   The difference between aggressive medical intervention and comfort care was considered in light of the patient's goals of care. Patient is not interested in any sort of aggressive medical intervention such as intubation.  Discussed with patient the importance of continued conversation with family and the medical providers regarding overall plan of care and treatment options, ensuring decisions are within the context of the patients values and GOCs.    Hospice and Palliative Care services outpatient were explained and offered. Patient is already connected to outpatient palliative. We discussed considering when transition to hospice services would be appropriate.   Questions and concerns were addressed. The family was encouraged to call with questions or concerns.   Primary Decision Maker PATIENT    SUMMARY OF RECOMMENDATIONS   - patient DNR - patient understands condition and limited treatment options - connected to outpatient palliative, timing of transition to hospice discussed  Code Status/Advance Care Planning: DNR/DNI     Primary Diagnoses: Present on Admission:  Acute hypoxemic respiratory failure ([place])  Essential hypertension  Chronic heart failure with preserved ejection fraction (HFpEF) ([place])  Acute respiratory failure with hypoxia ([place])  I have reviewed the medical record, interviewed the patient and family, and examined the patient. The following aspects are pertinent.  Past Medical History:  Diagnosis Date    (HFpEF) heart failure with preserved ejection fraction ([place])    a. 05/2020 Echo: EF 55-60%, no rwma, Gr1 DD, mildly red RV fxn; b. 09/2020 RHC: PCWP 44mHg. CO/CI 4.7/2.1.   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 09/2020 Cath: LM 70d, LAD 70ost, 80/740mLCX 100ost/p, 99p/m, RCA 100ost/p, VG->OM2 4022mIMA->LAD nl, VG->RPDA nl-->Med Rx.   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   right scalp/neck postauricular - EDC   Social History   Socioeconomic History   Marital status: Married    Spouse name: Not on file   Number of children: Not on file   Years of education: Not on file   Highest education level: Not on file  Occupational History   Not on file  Tobacco Use   Smoking status: Never   Smokeless tobacco: Former    Types: CheNurse, children'se: Never used  Substance and Sexual Activity   Alcohol use: Yes    Alcohol/week: 0.0 standard drinks    Comment: OCCAS-not within the last year as of 09/27/2020   Drug use: Never   Sexual activity: Not Currently  Other Topics Concern   Not on file  Social History Narrative   Not on file   Social Determinants of Health   Financial Resource Strain: Not on file  Food Insecurity: Not on file  Transportation Needs: Not on file  Physical Activity: Not on file  Stress: Not on file  Social Connections: Not on file   Family History  Problem Relation Age of Onset   Lung disease Father    Scheduled Meds:  aspirin EC  81 mg Oral Daily   atorvastatin  20 mg Oral Daily   Chlorhexidine Gluconate Cloth  6 each Topical Daily   enoxaparin (LOVENOX) injection  40 mg Subcutaneous Q24H   furosemide  40 mg Intravenous  Once   ipratropium-albuterol  3 mL Nebulization Q6H   methylPREDNISolone (SOLU-MEDROL) injection  40 mg Intravenous Q12H   metoprolol tartrate  25 mg Oral BID   Pirfenidone  534 mg Oral TID   sodium chloride flush  3 mL Intravenous Q12H   Continuous Infusions:  sodium chloride     azithromycin Stopped (06/19/21 1851)   cefTRIAXone (ROCEPHIN)  IV Stopped (06/19/21 1759)   PRN Meds:.sodium chloride, acetaminophen **OR** acetaminophen, ondansetron **OR** ondansetron (ZOFRAN) IV, sodium chloride flush Allergies  Allergen Reactions   Ofev [Nintedanib] Diarrhea and Nausea Only   Review of Systems  Constitutional:  Positive for activity change, appetite change and fatigue.  Respiratory:  Positive for shortness of breath.    Physical Exam Constitutional:      General: He is not in acute distress. Pulmonary:     Effort: Pulmonary effort is normal.  Skin:    General: Skin is warm and dry.  Neurological:     Mental Status: He is alert and oriented to person,  place, and time.    Vital Signs: BP (!) 146/65 (BP Location: Right Arm)    Pulse 67    Temp 97.7 F (36.5 C) (Oral)    Resp 19    Ht _0  (1.753 m)    Wt 98.9 kg    SpO2 99%    BMI 32.19 kg/m  Pain Scale: 0-10   Pain Score: 0-No pain   SpO2: SpO2: 99 % O2 Device:SpO2: 99 % O2 Flow Rate: .O2 Flow Rate (L/min): 6 L/min  IO: Intake/output summary:  Intake/Output Summary (Last 24 hours) at 06/20/2021 1611 Last data filed at 06/19/2021 1851 Gross per 24 hour  Intake 349 ml  Output --  Net 349 ml    LBM:   Baseline Weight: Weight: 99.8 kg Most recent weight: Weight: 98.9 kg     Palliative Assessment/Data: PPS 50%     Juel Burrow, DNP, [facility] Palliative Medicine Team [phone] Pager: [phone]

## 2021-06-20 NOTE — Progress Notes (Signed)
Pt declined bipap for tonight

## 2021-06-20 NOTE — ED Notes (Addendum)
This nurse verified with Dr. Aileen Fass and Dr. Chase Caller, as well as Pharmacy, pt's Grandson to bring pt's Pirfenidone from home, as this medication is not available in the Hospital at this time, per Pharmacy. Verified with Pharmacy, home med supply form to be filled out and brought to Pharmacy store it in the Rx and dispense to pt while in the [place] expresses understanding.

## 2021-06-20 NOTE — ED Notes (Signed)
Pt in CT at this time.

## 2021-06-20 NOTE — Progress Notes (Signed)
NAME:  Garrett Peterson, MRN:  [id number], DOB:  [birth year], LOS: 0 ADMISSION DATE:  06/19/2021, CONSULTATION DATE:  06/20/21 REFERRING MD:  EDP, CHIEF COMPLAINT:  Shortness of breath   History of Present Illness:  Garrett Peterson is a 76 y.o. M with PMH significant for CAD, HFpEF, HTN, Anorectal Ca s/p sigmoid colectomy and ILD on  Tyvaso and Esbriet and follows with Dr. Chase Caller who presents to the ED with 6 days of acutely worsening dyspnea and hypoxia with exertion.   He is on 6-8L O2 at baseline, noted acutely worsening O2 saturations down to 70% and did not feel "quite right", though denies any significant coughing, fever, runny nose, chest pain, LE edema, diarrhea or known ill contacts.   In the ED, pt required 6-7L O2, CXR shows increased interstitial markings with interstitial PNA not exclueded.   Labs were significant for WBC 11k, BNP 130, pH 7.36 with normal renal function.  Covid-19 and influenza negative.      Pertinent  Medical History   has a past medical history of (HFpEF) heart failure with preserved ejection fraction ([place]), Actinic keratosis, Adenomatous polyp of colon, Arthritis, Basal cell carcinoma, CAD (coronary artery disease), Cancer ([place]) (2021), Hyperlipidemia, Hypertension, Interstitial lung disease ([place]), Pneumonia, Pre-diabetes, Respiratory failure with hypoxia ([place]), and 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   Significant Hospital Events: Including procedures, antibiotic start and stop dates in addition to other pertinent events   2/7 presented to ED with worsening hypoxia, PCCM consult, admit to [facility]  Interim History / Subjective:   Patient desaturated to 73% when walking with 6L supplemental O2. Noted by PT.   He feels same as yesterday. CT PE study is negative for PE.  Objective   Blood pressure (!) 144/69, pulse 77, temperature 97.7 F (36.5 C), resp. rate (!) 21, height _0  (1.753 m), weight 98.9 kg, SpO2 100 %.        Intake/Output Summary  (Last 24 hours) at 06/20/2021 1313 Last data filed at 06/19/2021 1851 Gross per 24 hour  Intake 349 ml  Output --  Net 349 ml   Filed Weights   06/19/21 1016 06/19/21 1027  Weight: 99.8 kg 98.9 kg   General: elderly male, no acute distress, resting in bed HEENT: [place]/AT, moist mucous membranes, sclera anicteric Neuro: A&O x 3, moving all extremities CV: rrr, s1s2, no murmurs PULM: bibasilar rales. No wheezing. Diminished air movement. GI: soft, non-tender, non-distended, BS+ Extremities: warm, no edema Skin: no rashes   Resolved Hospital Problem list     Assessment & Plan:   Acute on Chronic Hypoxic Respiratory Failure in the setting of ILD and pulmonary hypertension Differential includes ILD flare vs infection vs heart failure and pulmonary hypertension - Extended respiratory viral panel is negative. Procalcitonin is not elevated. Urine legionella and strep pneumo ag are negative. - CRP mildly elevated at 1.6. Aldolase and CK not elevated - CT PE study is negative for pulmonary emboli in setting of elevated D-dimer  Plan: - complete course of antibiotics for CAP, currently on ceftriaxone + azithromycin - Continue solumedrol for possible ILD flare, will taper dose tomorrow - Will add 42m IV lasix to the oral dose of 460mlasix he received today for diuresis - I had discussion with patient that I am concerned about the decline in his lung function over the past year and the downward trend he appears to be on. He continues to look for possible treatment solutions.  - Continue pirfenidone for pulmonary fibrosis  PCCM will continue to follow  Best Practice (right click and "Reselect all SmartList Selections" daily)   Per primary  Labs   CBC: Recent Labs  Lab 06/19/21 1156 06/19/21 1703 06/20/21 0543  WBC 11.0* 9.7 11.4*  HGB 12.9* 13.5 13.5  HCT 38.2* 40.4 38.4*  MCV 99.0 99.3 96.7  PLT 177 187 536    Basic Metabolic Panel: Recent Labs  Lab 06/19/21 1156  06/19/21 1703 06/20/21 0543  NA 139  --  139  K 3.5  --  3.9  CL 97*  --  92*  CO2 35*  --  31  GLUCOSE 141*  --  171*  BUN 10  --  13  CREATININE 0.77 0.83 0.75  CALCIUM 8.7*  --  9.8   GFR: Estimated Creatinine Clearance: 89.7 mL/min (by C-G formula based on SCr of 0.75 mg/dL). Recent Labs  Lab 06/19/21 1156 06/19/21 1450 06/19/21 1703 06/20/21 0543  PROCALCITON  --  <0.10  --   --   WBC 11.0*  --  9.7 11.4*  LATICACIDVEN  --   --  1.9  --     Liver Function Tests: Recent Labs  Lab 06/19/21 1156 06/20/21 0543  AST 26 24  ALT 25 25  ALKPHOS 126 126  BILITOT 0.5 0.4  PROT 7.5 7.3  ALBUMIN 3.7 3.6   No results for input(s): LIPASE, AMYLASE in the last 168 hours. No results for input(s): AMMONIA in the last 168 hours.  ABG    Component Value Date/Time   HCO3 34.4 (H) 06/19/2021 1156   O2SAT 73.8 06/19/2021 1156     Coagulation Profile: No results for input(s): INR, PROTIME in the last 168 hours.  Cardiac Enzymes: Recent Labs  Lab 06/19/21 1703  CKTOTAL 36*    HbA1C: No results found for: HGBA1C  CBG: No results for input(s): GLUCAP in the last 168 hours.   Critical care time: n/a    Freda Jackson, MD [facility] Office: [phone]   See Amion for personal pager PCCM on call pager [phone] until 7pm. Please call [place] 7p-7a. [phone]

## 2021-06-20 NOTE — Progress Notes (Signed)
Pt refused bipap tonight.  Pt states anything on his face will make him feel "trapped" as he is claustrophobic.  Pt was advised that RT is available all night should he change his mind.

## 2021-06-21 LAB — CBC
HCT: 39.7 % (ref 39.0–52.0)
Hemoglobin: 13.6 g/dL (ref 13.0–17.0)
MCH: 33.7 pg (ref 26.0–34.0)
MCHC: 34.3 g/dL (ref 30.0–36.0)
MCV: 98.3 fL (ref 80.0–100.0)
Platelets: 220 10*3/uL (ref 150–400)
RBC: 4.04 MIL/uL — ABNORMAL LOW (ref 4.22–5.81)
RDW: 12.7 % (ref 11.5–15.5)
WBC: 16.6 10*3/uL — ABNORMAL HIGH (ref 4.0–10.5)
nRBC: 0 % (ref 0.0–0.2)

## 2021-06-21 LAB — BASIC METABOLIC PANEL
Anion gap: 11 (ref 5–15)
BUN: 18 mg/dL (ref 8–23)
CO2: 33 mmol/L — ABNORMAL HIGH (ref 22–32)
Calcium: 9.1 mg/dL (ref 8.9–10.3)
Chloride: 95 mmol/L — ABNORMAL LOW (ref 98–111)
Creatinine, Ser: 0.94 mg/dL (ref 0.61–1.24)
GFR, Estimated: >60 mL/min (ref >60)
Glucose, Bld: 231 mg/dL — ABNORMAL HIGH (ref 70–99)
Potassium: 3.8 mmol/L (ref 3.5–5.1)
Sodium: 139 mmol/L (ref 135–145)

## 2021-06-21 MED ORDER — HEPARIN SOD (PORK) LOCK FLUSH 100 UNIT/ML IV SOLN
500.0000 [IU] | INTRAVENOUS | Status: AC | PRN
  Administered 2021-06-21: 500 [IU]
  Filled 2021-06-21: qty 5

## 2021-06-21 MED ORDER — AZITHROMYCIN 500 MG PO TABS: 500.0000 mg | ORAL_TABLET | Freq: Every day | ORAL | 0 refills | Status: AC

## 2021-06-21 MED ORDER — AZITHROMYCIN 500 MG PO TABS: 500.0000 mg | ORAL_TABLET | Freq: Every day | ORAL | 0 refills | Status: DC

## 2021-06-21 MED ORDER — PREDNISONE 10 MG PO TABS: ORAL_TABLET | ORAL | 0 refills | Status: DC

## 2021-06-21 MED ORDER — CEFDINIR 300 MG PO CAPS: 300.0000 mg | ORAL_CAPSULE | Freq: Two times a day (BID) | ORAL | 0 refills | Status: AC

## 2021-06-21 MED ORDER — CEFDINIR 300 MG PO CAPS: 300.0000 mg | ORAL_CAPSULE | Freq: Two times a day (BID) | ORAL | 0 refills | Status: DC

## 2021-06-21 MED ORDER — FUROSEMIDE 40 MG PO TABS: 40.0000 mg | ORAL_TABLET | Freq: Two times a day (BID) | ORAL | 5 refills | Status: DC

## 2021-06-21 MED ORDER — FUROSEMIDE 10 MG/ML IJ SOLN
40.0000 mg | Freq: Once | INTRAMUSCULAR | Status: AC
  Administered 2021-06-21: 40 mg via INTRAVENOUS
  Filled 2021-06-21: qty 4

## 2021-06-21 MED ORDER — PREDNISONE 5 MG PO TABS: 5.0000 mg | ORAL_TABLET | Freq: Every day | ORAL | 2 refills | Status: DC

## 2021-06-21 MED ORDER — PREDNISONE 10 MG PO TABS: 10.0000 mg | ORAL_TABLET | Freq: Every day | ORAL | 0 refills | Status: DC

## 2021-06-21 NOTE — Progress Notes (Signed)
NAME:  Garrett Peterson, MRN:  [id number], DOB:  Jul 03, 1945, LOS: 1 ADMISSION DATE:  06/19/2021, CONSULTATION DATE:  06/21/21 REFERRING MD:  EDP, CHIEF COMPLAINT:  Shortness of breath   History of Present Illness:  Garrett Peterson is a 76 y.o. M with PMH significant for CAD, HFpEF, HTN, Anorectal Ca s/p sigmoid colectomy and ILD on  Tyvaso and Esbriet and follows with Dr. Chase Caller who presents to the ED with 6 days of acutely worsening dyspnea and hypoxia with exertion.   He is on 6-8L O2 at baseline, noted acutely worsening O2 saturations down to 70% and did not feel "quite right", though denies any significant coughing, fever, runny nose, chest pain, LE edema, diarrhea or known ill contacts.   In the ED, pt required 6-7L O2, CXR shows increased interstitial markings with interstitial PNA not exclueded.   Labs were significant for WBC 11k, BNP 130, pH 7.36 with normal renal function.  Covid-19 and influenza negative.     Pertinent  Medical History   has a past medical history of (HFpEF) heart failure with preserved ejection fraction ([place]), Actinic keratosis, Adenomatous polyp of colon, Arthritis, Basal cell carcinoma, CAD (coronary artery disease), Cancer ([place]) (2021), Hyperlipidemia, Hypertension, Interstitial lung disease ([place]), Pneumonia, Pre-diabetes, Respiratory failure with hypoxia ([place]), and 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   Significant Hospital Events: Including procedures, antibiotic start and stop dates in addition to other pertinent events   2/7 presented to ED with worsening hypoxia, PCCM consult, admit to [facility] 2/8 Desaturated to 73% on 6L, CT negative for PE 2/9 improved, ambulated on 8L~ 50 ft with sats >92%, then additional 1f with the desaturation briefly to 80, then 8% and stayed, recovered after few minutes to 97% on 6L  Interim History / Subjective:  Pt reports feeling better than yesterday  Remains on 6L  Objective   Blood pressure (!) 158/90, pulse 60,  temperature 97.9 F (36.6 C), temperature source Oral, resp. rate 20, height 5' 9" (1.753 m), weight 98.9 kg, SpO2 100 %.        Intake/Output Summary (Last 24 hours) at 06/21/2021 1240 Last data filed at 06/21/2021 04098Gross per 24 hour  Intake 220 ml  Output --  Net 220 ml   Filed Weights   06/19/21 1016 06/19/21 1027  Weight: 99.8 kg 98.9 kg   General: adult male lying in bed in NAD, grandson at bedside HEENT: MM pink/moist, good dentition, anicteric, [place] O2 Neuro: AAOx4, speech clear, MAE  CV: s1s2 RRR, no m/r/g PULM: non-labored at rest, posterior bibasilar crackles  GI: soft, bsx4 active  Extremities: warm/dry, no edema  Skin: no rashes or lesions   Resolved Hospital Problem list     Assessment & Plan:   Acute on Chronic Hypoxic Respiratory Failure in the setting of ILD and pulmonary hypertension Differential includes ILD flare vs infection vs heart failure and pulmonary hypertension Extended respiratory viral panel is negative. Procalcitonin is not elevated. Urine legionella and strep pneumo ag are negative. CT PE study negative for PE in setting of elevated D-Dimer. CRP mildly elevated at 1.6. Aldolase and CK not elevated.  Plan: -plan for discharge per primary  -O2 6L at rest, 8L with exertion  -increase home lasix at discharge to 40 mg BID, with follow up BMP with Dr. RChase Calleron 2/16 -slow taper of steroids back to baseline 15 mg > pt encouraged to call office if he begins to feel worse with taper (may need to stay on higher dose  until seen in clinic) -continue pirfenidone, tyvaso at discharge  -follow up arranged with Dr. Chase Caller on 2/16 -he will follow with home Palliative Care > feels as though he is not ready for hospice -DNR  -appreciate TRH care of patient during admission  Best Practice (right click and "Reselect all SmartList Selections" daily)  Per primary       Garrett Gens, MSN, APRN, NP-C, AGACNP-BC [facility] 06/21/2021, 12:40 PM   Please see [email or web address] for pager details.   From 7A-7P if no response, please call [phone] After hours, please call ELink [phone]

## 2021-06-21 NOTE — Progress Notes (Signed)
[employer] engineer [facility]) Hospital Liaison note:  This patient is currently enrolled in [facility] outpatient-based Palliative Care. Will continue to follow for disposition.  Please call with any outpatient palliative questions or concerns.  Thank you, Lorelee Market, LPN [facility] Liaison [phone]

## 2021-06-21 NOTE — Progress Notes (Signed)
Ambulated pt in hallway w/8L of O2... CCM NP at side... Pt walked about about 50 ft w/O2 above 92%.... walked another 50 ft and O2 dropped to 84%.... Pt was asymptomatic at all times and denied SOB, dyspnea.... We turned back and O2 dropped to 80%... Once we got into the room, it took about 1 min before O2 started to climb back up on 6 L while resting.... pt walked for a total of about 200 ft.   Dr. Erin Fulling and NP at bedside talking to pt.... Notified primary nurse.

## 2021-06-21 NOTE — Progress Notes (Signed)
Daily Progress Note   Patient Name: Garrett Peterson       Date: 06/21/2021 DOB: 04/20/1946  Age: 76 y.o. MRN#: [id number] Attending Physician: Aileen Fass, Tammi Klippel, MD Primary Care Physician: Albina Billet, MD Admit Date: 06/19/2021  Reason for Consultation/Follow-up: Establishing goals of care  Subjective: Feels better today, less chest tightness, wondering about going home  Length of Stay: 1  Current Medications: Scheduled Meds:   aspirin EC  81 mg Oral Daily   atorvastatin  20 mg Oral Daily   Chlorhexidine Gluconate Cloth  6 each Topical Daily   enoxaparin (LOVENOX) injection  40 mg Subcutaneous Q24H   ipratropium-albuterol  3 mL Nebulization TID   methylPREDNISolone (SOLU-MEDROL) injection  40 mg Intravenous Q12H   metoprolol tartrate  25 mg Oral BID   Pirfenidone  534 mg Oral TID   sodium chloride flush  3 mL Intravenous Q12H    Continuous Infusions:  sodium chloride     azithromycin 500 mg (06/20/21 1622)   cefTRIAXone (ROCEPHIN)  IV 1 g (06/20/21 1847)    PRN Meds: sodium chloride, acetaminophen **OR** acetaminophen, ondansetron **OR** ondansetron (ZOFRAN) IV, sodium chloride flush, sodium chloride flush, temazepam  Physical Exam Constitutional:      General: He is not in acute distress. Pulmonary:     Comments: Remains on HFNC Skin:    General: Skin is warm and dry.  Neurological:     Mental Status: He is alert.            Vital Signs: BP (!) 158/90 (BP Location: Right Arm)    Pulse 60    Temp 97.9 F (36.6 C) (Oral)    Resp 20    Ht _0  (1.753 m)    Wt 98.9 kg    SpO2 100%    BMI 32.19 kg/m  SpO2: SpO2: 100 % O2 Device: O2 Device: Nasal Cannula O2 Flow Rate: O2 Flow Rate (L/min): 6 L/min  Intake/output summary:  Intake/Output Summary (Last 24 hours) at 06/21/2021  1204 Last data filed at 06/21/2021 6808 Gross per 24 hour  Intake 220 ml  Output --  Net 220 ml   LBM: Last BM Date: 06/20/21 Baseline Weight: Weight: 99.8 kg Most recent weight: Weight: 98.9 kg       Palliative Assessment/Data: PPS 50%      Patient Active Problem List   Diagnosis Date Noted   Acute respiratory failure with hypoxia ([place]) 06/20/2021   Acute hypoxemic respiratory failure (HCC) 06/19/2021   Chronic heart failure with preserved ejection fraction (HFpEF) ([place]) 02/28/2021   Pulmonary hypertension, unspecified ([place]) 02/28/2021   Shortness of breath 09/28/2020   Lower extremity edema 09/21/2020   Idiopathic interstitial fibrosis ([place]) 08/08/2020   Chronic respiratory failure with hypoxia ([place]) 05/16/2020   Essential hypertension 03/03/2020   Generalized weakness    Thrombocytopenia ([place]) 12/29/2019   Port-A-Cath in place 11/02/2019   Encounter for antineoplastic chemotherapy 11/02/2019   Rectal cancer ([place]) 09/28/2019   Goals of care, counseling/discussion 09/12/2019   Coronary artery disease involving native coronary artery of native heart without angina pectoris 07/15/2018   Hx of adenomatous colonic polyps 07/15/2018   Hyperlipidemia LDL goal <70 07/15/2018   Personal history  of other malignant neoplasm of skin 06/27/2014    Palliative Care Assessment & Plan   HPI:  76 y.o. male  with past medical history of CAD, HFpEF, pulmonary htn, HTN, Anorectal Ca s/p sigmoid colectomy and ILD on  Tyvaso and Esbriet  admitted on 06/19/2021 with worsening dyspnea and hypoxia. CXR showed increased interstitial markings with interstitial PNA not exclueded. Patient started on antibiotics and also on steroids for possible ILD flare. Patient has had decline in lung function over past year and now with concern of continued decline. PMT consulted to discuss [place].   Assessment: Follow up today with grandson in room. We review past 24 hours - slept well, feels better. Hopeful to go  home today - going to try to walk soon and see how he does.  Tells me he feels he will do better at home.  Discussed ongoing support at home - plans to follow up with palliative care outpatient shortly after discharge. We discussed extra support through hospice care whenever he is interested. He shares he is not interested in hospice at this time.We discussed symptom management needs.  He reviews his frustrations with his disease.  Emotional support provided. Some concern about needing extra support at home - will discuss potential home health with TOC.  Recommendations/Plan: - some concern about extra support at home, will discuss with TOC - follow up with outpatient palliative - hospice introduced, not ready - maintain DNR  Code Status: DNR  Discharge Planning: Home with [place] was discussed with patient, grandson, TOC  Thank you for allowing the Palliative Medicine Team to assist in the care of this patient.   Juel Burrow, DNP, [facility] Palliative Medicine Team Team Phone # [phone]  Pager [phone]

## 2021-06-21 NOTE — Discharge Summary (Addendum)
Physician Discharge Summary  Garrett Peterson IRS:[id number] DOB: 1946/02/06 DOA: 06/19/2021  PCP: Albina Billet, MD  Admit date: 06/19/2021 Discharge date: 06/21/2021  Admitted From: Home isposition:  Home  Recommendations for Outpatient Follow-up:  Follow up with Pulmonary in 1-2 weeks Please obtain BMP/CBC in one week  Home Health:No Equipment/Devices:Home oxygen  Discharge Condition:Stable CODE STATUS:Full Diet recommendation: Heart Healthy  Brief/Interim Summary:  76 y.o. male past medical history of interstitial lung disease on 8 L of oxygen via nasal cannula, preserved EF heart failure, essential hypertension rectal cancer status post sigmoid colectomy comes into the hospital for shortness of breath that is worsening, he started noticing last Wednesday was he was becoming hypoxic with ambulation.  Denies any cough or fevers, chest x-ray in the ED showed pulmonary fibrosis and possible superimposed interstitial edema.  PCCM recommended Rocephin and azithromycin and steroids  Discharge Diagnoses:  Principal Problem:   Acute hypoxemic respiratory failure ([place]) Active Problems:   Essential hypertension   Idiopathic interstitial fibrosis (HCC)   Chronic heart failure with preserved ejection fraction (HFpEF) (HCC)   Acute respiratory failure with hypoxia (HCC)  Acute hypoxemic respiratory failure possibly due to community-acquired pneumonia in the setting of idiopathic pulmonary fibrosis: He was started on steroids and antibiotics pulmonary was consulted which agree with care. He will go home on a steroid taper and course of antibiotics orally which she will finish as an outpatient.  Chronic diastolic heart failure: Stable no changes made to his medication.  Hyperlipidemia: Continue statins.    Discharge Instructions  Discharge Instructions     Diet - low sodium heart healthy   Complete by: As directed    For home use only DME oxygen   Complete by: As directed    Length of  Need: Lifetime   Mode or (Route): Nasal cannula   Liters per Minute: 6   Oxygen conserving device: Yes   Oxygen delivery system: Gas   Increase activity slowly   Complete by: As directed       Allergies as of 06/21/2021       Reactions   Ofev [nintedanib] Diarrhea, Nausea Only        Medication List     TAKE these medications    aspirin EC 81 MG tablet Take 1 tablet (81 mg total) by mouth daily. Swallow whole.   atorvastatin 20 MG tablet Commonly known as: LIPITOR Take 20 mg by mouth daily.   azithromycin 500 MG tablet Commonly known as: Zithromax Take 1 tablet (500 mg total) by mouth daily for 3 days. Take 1 tablet daily for 3 days.   cefdinir 300 MG capsule Commonly known as: OMNICEF Take 1 capsule (300 mg total) by mouth 2 (two) times daily for 5 days.   furosemide 40 MG tablet Commonly known as: LASIX Take 1 tablet (40 mg total) by mouth 2 (two) times daily for 7 days. What changed: when to take this   metoprolol tartrate 25 MG tablet Commonly known as: LOPRESSOR Take 25 mg by mouth 2 (two) times daily.   Pirfenidone 267 MG Tabs Commonly known as: Esbriet Take 2 tablets (534 mg total) by mouth in the morning, at noon, and at bedtime.   predniSONE 10 MG tablet Commonly known as: DELTASONE Takes 6 tablets for 1 days, then 5 tablets for 1 days, then 4 tablets for 1 days, then 3 tablets for 1 days, then 2 tabs for 1 days, then 1 tab for 1 days, and then stop. What changed:  You were already taking a medication with the same name, and this prescription was added. Make sure you understand how and when to take each.   predniSONE 10 MG tablet Commonly known as: DELTASONE Take 1 tablet (10 mg total) by mouth daily with breakfast. Start taking on: June 27, 2021 What changed: These instructions start on June 27, 2021. If you are unsure what to do until then, ask your doctor or other care provider.   predniSONE 5 MG tablet Commonly known as: DELTASONE Take  1 tablet (5 mg total) by mouth daily with breakfast. Start taking on: June 27, 2021 What changed: These instructions start on June 27, 2021. If you are unsure what to do until then, ask your doctor or other care provider.   temazepam 30 MG capsule Commonly known as: RESTORIL Take 30 mg by mouth at bedtime.   Tyvaso 0.6 MG/ML Soln Generic drug: Treprostinil Inhale into the lungs 4 (four) times daily.               Durable Medical Equipment  (From admission, onward)           Start     Ordered   06/21/21 1230  For home use only DME 4 wheeled rolling walker with seat  Once       Question:  Patient needs a walker to treat with the following condition  Answer:  IPF (idiopathic pulmonary fibrosis) ([place])   06/21/21 1230   06/21/21 0000  For home use only DME oxygen       Question Answer Comment  Length of Need Lifetime   Mode or (Route) Nasal cannula   Liters per Minute 6   Oxygen conserving device Yes   Oxygen delivery system Gas      06/21/21 1051            Follow-up Information     Brand Males, MD Follow up on 06/28/2021.   Specialty: Pulmonary Disease Why: Appt at 10:15 (changed to in person).  Please arrive at 10:00 for check in Contact information: [place] [address] [place] [address] [phone]                Allergies  Allergen Reactions   Ofev [Nintedanib] Diarrhea and Nausea Only    Consultations: Pulmonary and critical care  Procedures/Studies: CT Angio Chest Pulmonary Embolism (PE) W or WO Contrast  Result Date: 06/20/2021 CLINICAL DATA:  Shortness of breath and left chest soreness. Elevated D-dimer. EXAM: CT ANGIOGRAPHY CHEST WITH CONTRAST TECHNIQUE: Multidetector CT imaging of the chest was performed using the standard protocol during bolus administration of intravenous contrast. Multiplanar CT image reconstructions and MIPs were obtained to evaluate the vascular anatomy. RADIATION DOSE REDUCTION: This exam was  performed according to the departmental dose-optimization program which includes automated exposure control, adjustment of the mA and/or kV according to patient size and/or use of iterative reconstruction technique. CONTRAST:  55m OMNIPAQUE IOHEXOL 350 MG/ML SOLN COMPARISON:  Portable chest obtained earlier today. Chest CT dated 03/23/2021. FINDINGS: Cardiovascular: Stable enlarged heart. Stable post CABG changes. Atheromatous calcifications, including the coronary arteries and aorta. Normally opacified pulmonary arteries with no pulmonary arterial filling defects seen. Mediastinum/Nodes: No enlarged mediastinal, hilar, or axillary lymph nodes. Thyroid gland, trachea, and esophagus demonstrate no significant findings. Lungs/Pleura: No significant change in previously demonstrated severe chronic interstitial lung disease and bronchiectasis. Mild posterior right upper lobe paraseptal emphysematous changes. No superimposed acute process. No pleural fluid. Upper Abdomen: Unremarkable. Musculoskeletal: Thoracic and lower  cervical spine degenerative changes. Review of the MIP images confirms the above findings. IMPRESSION: 1. No pulmonary emboli or other acute abnormality. 2. Stable cardiomegaly and post CABG changes. 3. Stable severe chronic interstitial lung disease, bronchiectasis and paraseptal emphysema. 4. Dense calcific coronary artery and aortic atherosclerosis. Aortic Atherosclerosis (ICD10-I70.0) and Emphysema (ICD10-J43.9). Electronically Signed   By: Claudie Revering M.D.   On: 06/20/2021 10:53   DG Chest Port 1 View  Result Date: 06/19/2021 CLINICAL DATA:  Shortness of breath, hypoxia EXAM: PORTABLE CHEST 1 VIEW COMPARISON:  Previous studies including the radiographs done on 05/16/2020 and CT done on 03/22/2021 FINDINGS: Transverse diameter of heart is increased. Increased interstitial markings are seen in the mid and lower lung fields. Interstitial fibrosis was noted in the CT chest done on 03/22/2021. Upper  lung fields are unremarkable. There is poor inspiration. There is no significant pleural effusion or pneumothorax. Tip of central venous catheter is seen in the superior vena cava. 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 IMPRESSION: Increased interstitial markings are seen in the mid and lower lung fields suggesting pulmonary fibrosis. Possibility of superimposed interstitial pneumonia is not excluded. Electronically Signed   By: Elmer Picker M.D.   On: 06/19/2021 11:55   (Echo, Carotid, EGD, Colonoscopy, ERCP)    Subjective: Relates his breathing is better  Discharge Exam: Vitals:   06/20/21 2049 06/21/21 0430  BP: 126/66 (!) 158/90  Pulse: 69 60  Resp: 18 20  Temp: 98 F (36.7 C) 97.9 F (36.6 C)  SpO2: 99% 100%   Vitals:   06/20/21 1816 06/20/21 1857 06/20/21 2049 06/21/21 0430  BP:  133/65 126/66 (!) 158/90  Pulse:  69 69 60  Resp:  _0 Temp:  98 F (36.7 C) 98 F (36.7 C) 97.9 F (36.6 C)  TempSrc:  Oral Oral Oral  SpO2: 98% 100% 99% 100%  Weight:      Height:        General: Pt is alert, awake, not in acute distress Cardiovascular: RRR, S1/S2 +, no rubs, no gallops Respiratory: CTA bilaterally, no wheezing, no rhonchi Abdominal: Soft, NT, ND, bowel sounds + Extremities: no edema, no cyanosis    The results of significant diagnostics from this hospitalization (including imaging, microbiology, ancillary and laboratory) are listed below for reference.     Microbiology: Recent Results (from the past 240 hour(s))  Resp Panel by RT-PCR (Flu A&B, Covid) Nasopharyngeal Swab     Status: None   Collection Time: 06/19/21 11:56 AM   Specimen: Nasopharyngeal Swab; Nasopharyngeal(NP) swabs in vial transport medium  Result Value Ref Range Status   SARS Coronavirus 2 by RT PCR NEGATIVE NEGATIVE Final    Comment: (NOTE) SARS-CoV-2 target nucleic acids are NOT DETECTED.  The SARS-CoV-2 RNA is generally detectable in upper respiratory specimens during the  acute phase of infection. The lowest concentration of SARS-CoV-2 viral copies this assay can detect is 138 copies/mL. A negative result does not preclude SARS-Cov-2 infection and should not be used as the sole basis for treatment or other patient management decisions. A negative result may occur with  improper specimen collection/handling, submission of specimen other than nasopharyngeal swab, presence of viral mutation(s) within the areas targeted by this assay, and inadequate number of viral copies(<138 copies/mL). A negative result must be combined with clinical observations, patient history, and epidemiological information. The expected result is Negative.  Fact Sheet for Patients:  EntrepreneurPulse.com.au  Fact Sheet for Healthcare Providers:  IncredibleEmployment.be  This test is no  t yet approved or cleared by the [place] and  has been authorized for detection and/or diagnosis of SARS-CoV-2 by FDA under an Emergency Use Authorization (EUA). This EUA will remain  in effect (meaning this test can be used) for the duration of the COVID-19 declaration under Section 564(b)(1) of the Act, 21 U.S.C.section 360bbb-3(b)(1), unless the authorization is terminated  or revoked sooner.       Influenza A by PCR NEGATIVE NEGATIVE Final   Influenza B by PCR NEGATIVE NEGATIVE Final    Comment: (NOTE) The Xpert Xpress SARS-CoV-2/FLU/RSV plus assay is intended as an aid in the diagnosis of influenza from Nasopharyngeal swab specimens and should not be used as a sole basis for treatment. Nasal washings and aspirates are unacceptable for Xpert Xpress SARS-CoV-2/FLU/RSV testing.  Fact Sheet for Patients: EntrepreneurPulse.com.au  Fact Sheet for Healthcare Providers: IncredibleEmployment.be  This test is not yet approved or cleared by the [place] FDA and has been authorized for detection and/or  diagnosis of SARS-CoV-2 by FDA under an Emergency Use Authorization (EUA). This EUA will remain in effect (meaning this test can be used) for the duration of the COVID-19 declaration under Section 564(b)(1) of the Act, 21 U.S.C. section 360bbb-3(b)(1), unless the authorization is terminated or revoked.  Performed at [facility], [place] [address] [place], [place] [address]   Respiratory (~20 pathogens) panel by PCR     Status: None   Collection Time: 06/19/21  2:50 PM   Specimen: Nasopharyngeal Swab; Respiratory  Result Value Ref Range Status   Adenovirus NOT DETECTED NOT DETECTED Final   Coronavirus 229E NOT DETECTED NOT DETECTED Final    Comment: (NOTE) The Coronavirus on the Respiratory Panel, DOES NOT test for the novel  Coronavirus (2019 nCoV)    Coronavirus HKU1 NOT DETECTED NOT DETECTED Final   Coronavirus NL63 NOT DETECTED NOT DETECTED Final   Coronavirus OC43 NOT DETECTED NOT DETECTED Final   Metapneumovirus NOT DETECTED NOT DETECTED Final   Rhinovirus / Enterovirus NOT DETECTED NOT DETECTED Final   Influenza A NOT DETECTED NOT DETECTED Final   Influenza B NOT DETECTED NOT DETECTED Final   Parainfluenza Virus 1 NOT DETECTED NOT DETECTED Final   Parainfluenza Virus 2 NOT DETECTED NOT DETECTED Final   Parainfluenza Virus 3 NOT DETECTED NOT DETECTED Final   Parainfluenza Virus 4 NOT DETECTED NOT DETECTED Final   Respiratory Syncytial Virus NOT DETECTED NOT DETECTED Final   Bordetella pertussis NOT DETECTED NOT DETECTED Final   Bordetella Parapertussis NOT DETECTED NOT DETECTED Final   Chlamydophila pneumoniae NOT DETECTED NOT DETECTED Final   Mycoplasma pneumoniae NOT DETECTED NOT DETECTED Final    Comment: Performed at [facility], [place]. [address] [place], [place] [address]  MRSA Next Gen by PCR, Nasal     Status: None   Collection Time: 06/19/21  6:52 PM  Result Value Ref Range Status   MRSA by PCR Next Gen NOT DETECTED NOT DETECTED Final     Comment: (NOTE) The GeneXpert MRSA Assay (FDA approved for NASAL specimens only), is one component of a comprehensive MRSA colonization surveillance program. It is not intended to diagnose MRSA infection nor to guide or monitor treatment for MRSA infections. Test performance is not FDA approved in patients less than 37 years old. Performed at [facility], [place] [address], [place], [place] [address]      Labs: BNP (last 3 results) Recent Labs    06/19/21 1156  BNP 130.7*  Basic Metabolic Panel: Recent Labs  Lab 06/19/21 1156 06/19/21 1703 06/20/21 0543 06/21/21 0819  NA 139  --  139 139  K 3.5  --  3.9 3.8  CL 97*  --  92* 95*  CO2 35*  --  31 33*  GLUCOSE 141*  --  171* 231*  BUN 10  --  13 18  CREATININE 0.77 0.83 0.75 0.94  CALCIUM 8.7*  --  9.8 9.1   Liver Function Tests: Recent Labs  Lab 06/19/21 1156 06/20/21 0543  AST 26 24  ALT 25 25  ALKPHOS 126 126  BILITOT 0.5 0.4  PROT 7.5 7.3  ALBUMIN 3.7 3.6   No results for input(s): LIPASE, AMYLASE in the last 168 hours. No results for input(s): AMMONIA in the last 168 hours. CBC: Recent Labs  Lab 06/19/21 1156 06/19/21 1703 06/20/21 0543 06/21/21 0819  WBC 11.0* 9.7 11.4* 16.6*  HGB 12.9* 13.5 13.5 13.6  HCT 38.2* 40.4 38.4* 39.7  MCV 99.0 99.3 96.7 98.3  PLT 177 187 196 220   Cardiac Enzymes: Recent Labs  Lab 06/19/21 1703  CKTOTAL 36*   BNP: Invalid input(s): POCBNP CBG: No results for input(s): GLUCAP in the last 168 hours. D-Dimer Recent Labs    06/19/21 1703  DDIMER 0.89*   Hgb A1c No results for input(s): HGBA1C in the last 72 hours. Lipid Profile No results for input(s): CHOL, HDL, LDLCALC, TRIG, CHOLHDL, LDLDIRECT in the last 72 hours. Thyroid function studies No results for input(s): TSH, T4TOTAL, T3FREE, THYROIDAB in the last 72 hours.  Invalid input(s): FREET3 Anemia work up No results for input(s): VITAMINB12, FOLATE, FERRITIN, TIBC, IRON, RETICCTPCT  in the last 72 hours. Urinalysis No results found for: COLORURINE, APPEARANCEUR, [place], [place], GLUCOSEU, [place], [place], [place], PROTEINUR, UROBILINOGEN, NITRITE, LEUKOCYTESUR Sepsis Labs Invalid input(s): PROCALCITONIN,  WBC,  LACTICIDVEN Microbiology Recent Results (from the past 240 hour(s))  Resp Panel by RT-PCR (Flu A&B, Covid) Nasopharyngeal Swab     Status: None   Collection Time: 06/19/21 11:56 AM   Specimen: Nasopharyngeal Swab; Nasopharyngeal(NP) swabs in vial transport medium  Result Value Ref Range Status   SARS Coronavirus 2 by RT PCR NEGATIVE NEGATIVE Final    Comment: (NOTE) SARS-CoV-2 target nucleic acids are NOT DETECTED.  The SARS-CoV-2 RNA is generally detectable in upper respiratory specimens during the acute phase of infection. The lowest concentration of SARS-CoV-2 viral copies this assay can detect is 138 copies/mL. A negative result does not preclude SARS-Cov-2 infection and should not be used as the sole basis for treatment or other patient management decisions. A negative result may occur with  improper specimen collection/handling, submission of specimen other than nasopharyngeal swab, presence of viral mutation(s) within the areas targeted by this assay, and inadequate number of viral copies(<138 copies/mL). A negative result must be combined with clinical observations, patient history, and epidemiological information. The expected result is Negative.  Fact Sheet for Patients:  EntrepreneurPulse.com.au  Fact Sheet for Healthcare Providers:  IncredibleEmployment.be  This test is no t yet approved or cleared by the [place] FDA and  has been authorized for detection and/or diagnosis of SARS-CoV-2 by FDA under an Emergency Use Authorization (EUA). This EUA will remain  in effect (meaning this test can be used) for the duration of the COVID-19 declaration under Section 564(b)(1) of the Act,  21 U.S.C.section 360bbb-3(b)(1), unless the authorization is terminated  or revoked sooner.       Influenza A by PCR NEGATIVE NEGATIVE Final  Influenza B by PCR NEGATIVE NEGATIVE Final    Comment: (NOTE) The Xpert Xpress SARS-CoV-2/FLU/RSV plus assay is intended as an aid in the diagnosis of influenza from Nasopharyngeal swab specimens and should not be used as a sole basis for treatment. Nasal washings and aspirates are unacceptable for Xpert Xpress SARS-CoV-2/FLU/RSV testing.  Fact Sheet for Patients: EntrepreneurPulse.com.au  Fact Sheet for Healthcare Providers: IncredibleEmployment.be  This test is not yet approved or cleared by the [place] FDA and has been authorized for detection and/or diagnosis of SARS-CoV-2 by FDA under an Emergency Use Authorization (EUA). This EUA will remain in effect (meaning this test can be used) for the duration of the COVID-19 declaration under Section 564(b)(1) of the Act, 21 U.S.C. section 360bbb-3(b)(1), unless the authorization is terminated or revoked.  Performed at [facility], [place] [address] [place], [place] [address]   Respiratory (~20 pathogens) panel by PCR     Status: None   Collection Time: 06/19/21  2:50 PM   Specimen: Nasopharyngeal Swab; Respiratory  Result Value Ref Range Status   Adenovirus NOT DETECTED NOT DETECTED Final   Coronavirus 229E NOT DETECTED NOT DETECTED Final    Comment: (NOTE) The Coronavirus on the Respiratory Panel, DOES NOT test for the novel  Coronavirus (2019 nCoV)    Coronavirus HKU1 NOT DETECTED NOT DETECTED Final   Coronavirus NL63 NOT DETECTED NOT DETECTED Final   Coronavirus OC43 NOT DETECTED NOT DETECTED Final   Metapneumovirus NOT DETECTED NOT DETECTED Final   Rhinovirus / Enterovirus NOT DETECTED NOT DETECTED Final   Influenza A NOT DETECTED NOT DETECTED Final   Influenza B NOT DETECTED NOT DETECTED Final   Parainfluenza Virus 1  NOT DETECTED NOT DETECTED Final   Parainfluenza Virus 2 NOT DETECTED NOT DETECTED Final   Parainfluenza Virus 3 NOT DETECTED NOT DETECTED Final   Parainfluenza Virus 4 NOT DETECTED NOT DETECTED Final   Respiratory Syncytial Virus NOT DETECTED NOT DETECTED Final   Bordetella pertussis NOT DETECTED NOT DETECTED Final   Bordetella Parapertussis NOT DETECTED NOT DETECTED Final   Chlamydophila pneumoniae NOT DETECTED NOT DETECTED Final   Mycoplasma pneumoniae NOT DETECTED NOT DETECTED Final    Comment: Performed at [facility], [place]. [address], [place], [place] [address]  MRSA Next Gen by PCR, Nasal     Status: None   Collection Time: 06/19/21  6:52 PM  Result Value Ref Range Status   MRSA by PCR Next Gen NOT DETECTED NOT DETECTED Final    Comment: (NOTE) The GeneXpert MRSA Assay (FDA approved for NASAL specimens only), is one component of a comprehensive MRSA colonization surveillance program. It is not intended to diagnose MRSA infection nor to guide or monitor treatment for MRSA infections. Test performance is not FDA approved in patients less than 31 years old. Performed at [facility], [place] [address] [place],  [address]      SIGNED:   Charlynne Cousins, MD  [facility] 06/21/2021, 12:48 PM Pager   If 7PM-7AM, please contact night-coverage [email or web address] Password [email or web address]

## 2021-06-21 NOTE — Telephone Encounter (Signed)
Clearance form and message from Dr. Chase Caller has been faxed back to [facility]. Nothing further needed at this time.

## 2021-06-21 NOTE — TOC Transition Note (Signed)
Transition of Care [facility]) - CM/SW Discharge Note   Patient Details  Name: LEONIDAS BOATENG MRN: [id number] Date of Birth: 1946-04-14  [facility]) CM/SW Contact:  Trish Mage, LCSW Phone Number: 06/21/2021, 12:23 PM   Clinical Narrative:   Patient seen in follow up to PT recommendation of [facility].  Mr Stjames lives in [place] with his wife, grandson [name] checks on them regularly.  He is interested in working with [facility] PT and getting a rollator as recommended.  Contacted Cindie with [facility] who agrees to provide [facility] services.  Contacted Danielle with [facility] who will arrange for delivery of rollator to patient room prior to d/c. Patient has home O2.  No further needs identified.  TOC sign off.    Final next level of care: [place] Barriers to Discharge: No Barriers Identified   Patient Goals and CMS Choice        Discharge Placement                       Discharge Plan and Services                                     Social Determinants of Health (SDOH) Interventions     Readmission Risk Interventions No flowsheet data found.

## 2021-06-25 ENCOUNTER — Other Ambulatory Visit: Payer: Medicare Other | Admitting: Primary Care

## 2021-06-25 ENCOUNTER — Other Ambulatory Visit: Payer: Self-pay

## 2021-06-25 VITALS — BP 124/60 | HR 64 | Temp 98.4°F | Resp 24 | Wt 218.0 lb

## 2021-06-25 DIAGNOSIS — Z515 Encounter for palliative care: Secondary | ICD-10-CM

## 2021-06-25 DIAGNOSIS — J84112 Idiopathic pulmonary fibrosis: Secondary | ICD-10-CM

## 2021-06-25 DIAGNOSIS — R0602 Shortness of breath: Secondary | ICD-10-CM

## 2021-06-25 DIAGNOSIS — I272 Pulmonary hypertension, unspecified: Secondary | ICD-10-CM

## 2021-06-25 NOTE — Progress Notes (Addendum)
° ° °[facility] °Community Palliative Care Consult Note °Telephone: [phone]  °Fax: [phone]  ° ° °Date of encounter: 06/25/21 °1:04 PM °PATIENT NAME: Garrett Peterson °[address] °[place] [address]   °[phone] (home)  °DOB: 05/15/1945 °MRN: [id number] °PRIMARY CARE PROVIDER:    °Tate, Denny C, MD,  °[address]   °[place] [address] °[phone] ° °REFERRING PROVIDER:   °Tate, Denny C, MD °[address]   °[place],  [place] [address] °[phone] ° °RESPONSIBLE PARTY:    °Contact Information   ° ° Name Relation Home Work Mobile  ° SAUL,RANDALL Grandson [phone]  [phone]  ° Bonser,VIRGINIA Spouse [phone]  ° °  ° ° °Due to the COVID-19 crisis, this visit was done via telemedicine from my office and it was initiated and consent by this patient and or family. ° °I connected with  Garrett Peterson OR PROXY on 06/25/21 by a video enabled telemedicine application and verified that I am speaking with the correct person using two identifiers. °  °I discussed the limitations of evaluation and management by telemedicine. The patient expressed understanding and agreed to proceed.  ° °I met face to face with patient and family in home connecting virtually with Julie Brown, RN.  Palliative Care was asked to follow this patient by consultation request of  Tate, Denny C, MD to address advance care planning and complex medical decision making. This is a follow up visit. ° °                                 ASSESSMENT AND PLAN / RECOMMENDATIONS:  ° °Advance Care Planning/Goals of Care: Goals include to maximize quality of life and symptom management. Patient/health care surrogate gave his/her permission to discuss. °Our advance care planning conversation included a discussion about:    °The value and importance of advance care planning  °Exploration of goals of care in the event of a sudden injury or illness  °Identification of a healthcare  agent- wife °Review of an  advance directive document . °Decision not to resuscitate-Form in the home.  °CODE STATUS:  DNR.  ° °Symptom Management/Plan: ° °Dyspnea; F/u s/p hospital stay for SOB worsening over several days. He was seen in ed and hospitalized x 2 days. Antibiotics and steroid course where initiated.  ° °Today he states he feels good. He is not dyspneic on visualization. . CT with contrast also reviewed of 06/20/21 which did not show PE, and also confirmed stable advanced ILD.  ° °He has oxygen in place and states this helps with his breathing. ° °Nutrition: He states he is eating at his baseline of 3 meals a day although he does endorse smaller portions and early satiety. His Albumin was normal at 3.6 and other labs were normal as well on 06/20/21 ° °Follow up Palliative Care Visit: Palliative care will continue to follow for complex medical decision making, advance care planning, and clarification of goals.  ° °This visit was coded based on medical decision making (MDM). ° °PPS: 50% ° °HOSPICE ELIGIBILITY/DIAGNOSIS: TBD ° °Chief Complaint: f/u from dyspnea, palliative goals ° °HISTORY OF PRESENT ILLNESS:  Garrett Peterson is a 76 y.o. year old male  with complex medical problems including ILD, chronic hypoxic respiratory failure, O2 dependent, h/o anorectal cancer, severe CAD, moderate pulmonary HTN,  ° °History obtained from review of EMR, discussion with primary   team, and interview with family, facility staff/caregiver and/or Garrett Peterson.  °I reviewed available labs, medications, imaging, studies and related documents from the EMR.  Records reviewed and summarized above.  ° °ROS ° °General: NAD °EYES: denies vision changes °ENMT: denies dysphagia °Cardiovascular: denies chest pain, + DOE °Pulmonary: denies cough, denies increased SOB °Abdomen: endorses fair to good appetite, denies constipation, endorses continence of bowel °GU: denies dysuria, endorses continence of urine °MSK:  denies increased  weakness,  no falls reported °Skin: denies rashes or wounds °Neurological: denies pain, + insomnia °Psych: Endorses positive mood °Heme/lymph/immuno: denies bruises, abnormal bleeding ° °Physical Exam: °Current and past weights: 218 lbs per epic °Constitutional: NAD °General: WNWD °EYES: anicteric sclera, lids intact, no discharge  °ENMT: intact hearing, oral mucous membranes moist, dentition intact °CV: S1S2, RRR, no LE edema °Pulmonary: LCTA, no increased work of breathing, no cough, + supplemental oxygen  °Abdomen: intake 75-100%,  no ascites °GU: deferred °MSK: no sarcopenia, moves all extremities, ambulatory °Skin: warm and dry, no rashes or wounds on visible skin °Neuro:  + generalized weakness,  no cognitive impairment °Psych: non-anxious affect, A and O x 3 °Hem/lymph/immuno: no widespread bruising ° ° °Thank you for the opportunity to participate in the care of Garrett Peterson.  The palliative care team will continue to follow. Please call our office at [phone] if we can be of additional assistance.  ° °Julie A Brown, RN  °Kathryn M Smith DNP, MPH, AGPCNP- BC, ACHPN ° °COVID-19 PATIENT SCREENING TOOL °Asked and negative response unless otherwise noted:  ° °Have you had symptoms of covid, tested positive or been in contact with someone with symptoms/positive test in the past 5-10 days? No °

## 2021-06-28 ENCOUNTER — Telehealth: Payer: Self-pay | Admitting: Internal Medicine

## 2021-06-28 ENCOUNTER — Other Ambulatory Visit: Payer: Self-pay

## 2021-06-28 ENCOUNTER — Ambulatory Visit (INDEPENDENT_AMBULATORY_CARE_PROVIDER_SITE_OTHER): Payer: Medicare Other | Admitting: Internal Medicine

## 2021-06-28 ENCOUNTER — Telehealth: Payer: Medicare Other | Admitting: Internal Medicine

## 2021-06-28 ENCOUNTER — Encounter: Payer: Self-pay | Admitting: Internal Medicine

## 2021-06-28 VITALS — BP 124/70 | HR 67 | Temp 97.8°F | Ht 69.0 in | Wt 215.8 lb

## 2021-06-28 DIAGNOSIS — J9611 Chronic respiratory failure with hypoxia: Secondary | ICD-10-CM

## 2021-06-28 DIAGNOSIS — Z01811 Encounter for preprocedural respiratory examination: Secondary | ICD-10-CM

## 2021-06-28 DIAGNOSIS — J84112 Idiopathic pulmonary fibrosis: Secondary | ICD-10-CM | POA: Diagnosis not present

## 2021-06-28 DIAGNOSIS — I2723 Pulmonary hypertension due to lung diseases and hypoxia: Secondary | ICD-10-CM | POA: Diagnosis not present

## 2021-06-28 MED ORDER — PREDNISONE 10 MG PO TABS: ORAL_TABLET | ORAL | 0 refills | Status: AC

## 2021-06-28 NOTE — Telephone Encounter (Signed)
Dr Alice Reichert  Please secure chat me or call me regarding colonoscopy for Garrett Peterson     SIGNATURE    Dr. Brand Males, M.D., F.C.C.P,  Pulmonary and Critical Care Medicine Staff Physician, [facility] Director - Interstitial Lung Disease  Program  P[facility] [place] [facility] [place], [place], [address]  NPI Number:  NPI #[id number] [facility] Number: [id number]  Pager: [phone], If no answer  -> Check AMION or Try [phone] Telephone (clinical office): [phone] Telephone (research): [phone]  12:52 PM 06/28/2021

## 2021-06-28 NOTE — Progress Notes (Signed)
Previous [facility] pulmonary encounter: 07/04/20- Dr. Chase Caller  Referred to the ILD center for comprehensive evaluation by Dr. Rodman Pickle primary pulmonologist 76 year old male, never smoked.  Significant for chronic hypoxic respiratory failure, coronary artery disease, hypertension.  Patient of Dr. Chase Caller. Started OFEV in April 2022.   Garrett Peterson 76 y.o. -very complicated story.  Details of the story is copied and pasted above.  Meeting him for the first time with his wife.  It appears that he was previously healthy as of April 2021.  Although CT scan of the chest at the time of my personal visualization showed presence of early ILD [CT abdomen 10 years prior in 2011 had no ILD].  He was diagnosed with anorectal cancer.  He was undergoing treatment at [facility].  Chemotherapy as above.  Then in the fall/winter 2020 when he started falling ill with respiratory illnesses.  This is all documented above.  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  CT scan of the chest by October 2021 started showing significant amount of groundglass opacities.  He only had ANA test, rheumatoid factor and angiotensin-converting enzyme.  This is all negative.  For serology.  Otherwise has not had any other serology.  He had hypersensitive pneumonitis panel this was negative.  This was at outside facility [facility] clinic pulmonology program.  He has had 2 hospitalizations the most recent one being in January.  It appears that he was treated with prednisone each time.  It is unclear to me as this to him and his wife if he has been on chronic prednisone but reading the notes it appears so.  Currently is just finished a taper starting at 60 mg/day in January and finished at 20 mg and stop.  He says since stopping yesterday or today he is beginning to feel a little bit worse.  Room air oxygen at rest was fine but when he desaturated after he walked more than 2 laps.  However he needed 5 L to correct.  Most recent  echocardiogram was normal.  Most recent lavage showed mostly neutrophils.  Again culture negative.  His most recent CT scan of the chest in February 2022 without contrast shows improvement in groundglass opacities but no fibrosis.  His serologies ANA negative and rheumatoid factor negative and angiotensin converting enzyme negative  Definitely earlier on in the year it was not consistent with UIP and suggested alternate diagnosis.  Unclear to me if he has probable UIP pattern at this point in time in February 2022.  We did not have time to do an extensive detailed ILD questionnaire.  It appears that his goals are to get better and also feel better at the same time control his cancer.  They are also wondering about prognosis.  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  He is currently looking into getting radiation locally.  Oncologist wants to do a colonoscopy.  She feels the recurrence is because all his chemo has been on hold because of respiratory issues.   SErology Dec 2021 - [name]  ANA Direct - [facility] Negative Negative   RA Latex Turbid. - [facility] 0.0 - 13.9 IU/mL <10.0   A.Fumigatus #1 Abs - [facility] Negative Negative  Micropolyspora faeni, IgG - [facility] Negative Negative  Thermoactinomyces vulgaris, IgG - [facility] Negative Negative  A. Pullulans Abs -  [facility] Negative Negative  Thermoact. Saccharii - [facility] Negative Negative  Pigeon Serum Abs - [facility] Negative Negative   Angio Convert Enzyme - [facility] 14 - 82 U/L 26   Walk tst 07/04/2020    94% - ra at rest -> walked 2 laps without desat and dropped to 86% in middle of 3rd lap. Then need 5L [place] to correct to walk 3 laps in office   CT chest 06/23/20   IMPRESSION: 1. 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 Fibrosis has progressed since 03/02/2020 chest CT with resolved consolidative opacities. 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 UIP is difficult to exclude but is less favored. Follow-up high-resolution chest CT suggested in 6-12 months. Findings are indeterminate for UIP per consensus guidelines: Diagnosis of Idiopathic Pulmonary Fibrosis: An Official ATS/ERS/JRS/ALAT Clinical Practice Guideline. [place], Iss 5, [phone], Jan 11 2017. 2. Stable mild cardiomegaly. 3. Aortic Atherosclerosis (ICD10-I70.0).     Electronically Signed   By: [name] M.D.   On: 06/24/2020 15:15      08/02/2020 Patient presents today for 1 month follow-up with spirometry/DLCO. Patient was discussed at ILD conference in March 2022, diagnosed with IPF. Recent exacerbation likely chemo related. Started on Ofev. Patient was referred for right heart cath and referred to pulmonary rehab.  He is currently on an extended prednisone taper. He started 35m prednisone dose today x 2 weeks. He is still taking Bactrim. Breathing has improved. He has not started antifibrotic medication yet, his wifes states that their insurance has improved OFEV medication.   PFT 08/02/2020 - FVC 1.61 (41%), FEV1 1.55 (55%), ratio 96, DLCOcor 12.27 (52%) 09/21/2020 Patient presents today for 1 month follow-up. Accompanied by his son. He stop pulmonary for a week or two since having bronchitis. Cough is better since taking mucinex. He is on 4L nasal cannula. He is having 6-7 loose stools a day. Diarrhea started when he went up to 2 tabs of Ofev. He has up coming hearth cath, date to be determined. Son reports some new leg swelling. Due for LFTs today.   HEart cth  10/03/20  Conclusions: Severe native coronary artery disease including 70% distal LMCA and mid LAD disease, sequential 100% ostial and 99% proximal LCx lesions, and chronic total occlusion of ostial RCA. Widely patent LIMA-LAD and SVG-rPDA. Patent SVG-OM2 with 40% stenosis in proximal/mid portion of SVG. Mildly elevated left heart filling pressure (LVEDP 20 mmHg, PCWP 23 mmHg). Moderately elevated right heart filling pressure (mean RAP 14 mmHg, LVEDP 20 mmHg). Moderate pulmonary hypertension (mean PAP 35 mmHg, PVR 2.6 WU). Mildly reduced cardiac output/index.   Recommendations: Aggressive secondary prevention of coronary artery disease. Initiate furosemide 40 mg PO daily for HFpEF.  BMP to be drawn when patient is seen for follow-up 10/13/2020. Ongoing management of pulmonary fibrosis and pulmonary hypertension per Dr. RChase Caller   CNelva Bush MD [facility]      OV 10/18/2020  Subjective:  Patient ID: PJuliette Peterson male , DOB: 1May 18, 1947, age 76y.o. , MRN: [id number], [address] [id number] TAlbina Billet MD Patient Care Team: TAlbina Billet MD as PCP - General (Internal Medicine) End, CHarrell Gave MD as PCP - Cardiology (Cardiology) SClent Jacks RN as  Oncology Nurse Navigator Earlie Server, MD as Consulting Physician (Oncology)  This Provider for this visit: Treatment Team:  Attending Provider: Brand Males, MD  Type of visit: Telephone/Video Circumstance: COVID-19 national emergency Identification of patient LEDGER HEINDL with 1945-11-27 and MRN [id number] - 2 person identifier Risks: Risks, benefits, limitations of telephone visit explained. Patient understood and verbalized agreement to proceed Anyone else on call: just patient Patient location: [phone] This provider location: Provider home because provider is isolating due to covid   10/18/2020 -  followup IPF.   HPI Garrett Peterson 76 y.o. - Off ofev since  09/21/20 due to diarrrhea. He called 09/28/20 and we told him to stop ofev.  But now he tells me 10/18/2020 that for 3 weeks he is taking 127m bid. No diarrhea other than going to BR frequently but is tolerabl and small amounts only. Respiration wise he is stable. Starts getting worse after lunch. Still on 4 LNC o2. No change Not on active cancer Rx. Has been cleared. Had [facility]- see ablve. Srated on new lasix and helped breathing somewhat. Is attending pulm rehab  Discussed Include in patients with ILD   - Right heart cath :  PVR > 3, PCWP </= 15, Pmap >/=  25 -  Patient needed to be able to walk 1012m 300 feet on a 6m22mlk test  - he does have exclusionary criteria - his PVR is < 3 and PCWP is  15 but he is interested in applying for tyvasos. Discussed side effect profile briefly       OV 12/19/2020  Subjective:  Patient ID: PhiJuliette Alcideale , DOB: 1/2Jun 27, 1947age [age]o. , MRN: [id number]: [id number] TatAlbina BilletD Patient Care Team: TatAlbina BilletD as PCP - General (Internal Medicine) End, ChrHarrell GaveD as PCP - Cardiology (Cardiology) StaClent JacksN as Oncology Nurse Navigator Yu,Earlie ServerD as Consulting Physician (Oncology)  This Provider for this visit: Treatment Team:  Attending Provider: RamBrand MalesD    12/19/2020 -   Chief Complaint  Patient presents with   Follow-up    PFT performed today.  Pt states that he has had a lot of postnasal drainage in the mornings. Pt states his breathing is about the same since last visit. Pt is on Tyvaso and states that has been working well.   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 -presents for follow-up with his family member.  Overall he stable.  Symptom scores are stable.  He had pulmonary function test.?  Quality of respiratory maneuver but his FVC shows 4% decline compared to 6 months ago of 5 months ago.  DLCO is stable.  He himself feels stable.  He is on inhaled treprostinil.  He feels inhaled treprostinil since early July 2022 is improving his shortness of breath.  He is using it 4 times daily and each time 7 times.  Plan is to escalate to close to 12 times each time.  He is not doing nintedanib.  He does not want to do nintedanib.  He rechallenged himself again for the second time and had side effects.  He feels is  because his GI motility is different after his anorectal cancer.  He wants to know if he can try any natural treatment.  He is particularly interested in pine pollen.  I told him that I do not know much about this medication but would be open to him trying it.  He also wanted to know what to do about sinus drainage which is the early in the morning for a few hours and is having to sneeze a lot.  His family member asked if he should turn down the humidifier which I said was okay.  Can also try saline nasal spray to clear it.  They wanted know about oxygen use.  He is using 4 L.  I advised him to use it continuously.  Also advised to adjusted for pulse ox goal greater than 88% with exertion.     OV 02/02/2021  Subjective:  Patient ID: Garrett Peterson, male , DOB: 12/15/1945 , age 40 y.o. , MRN: [id number] , ADDRESS: [place]  [id number] PCP Albina Billet, MD Patient Care Team: Albina Billet, MD as PCP - General (Internal Medicine) End, Harrell Gave, MD as PCP - Cardiology (Cardiology) Clent Jacks, RN as Oncology Nurse Navigator Earlie Server, MD as Consulting Physician (Oncology)  This Provider for this visit: Treatment Team:  Attending Provider: Brand Males, MD   Follow-up IPF [exacerbated by radiation therapy  for anal rectal cancer )  -Intolerant to nintedanib spring 2022  -Diagnosis on multidisciplinary case conference 2022  - start esbriet Aug 2022 WHO group 3 pulmonary hypertension -started Tyvaso early November 11, 2020 Chronic hypoxemic respiratory failure  -4 L due to the above   02/02/2021 -   Chief Complaint  Patient presents with   Follow-up    Pt states he is about the same since last visit. Still becomes SOB with exertion.     HPI Garrett Peterson 76 y.o. -presents for follow-up.  He presents with [name] his grandson.  He has completed pulmonary rehabilitation.  They are reporting still 4 L of oxygen use at rest and 5 L with exertion.  He is now on pirfenidone.  He tried to escalate to 3 pills 3 times daily but this caused GI issues and low appetite.  So he is just taking 2 pills 3 times daily.  His grandson states that at pulmonary rehab and 3 L he did desaturate to 83%.  At home on 4 L he will desaturate into the 80s.  But at 5 L he seems okay.  They want a portable oxygen system.  He says that the restricted lifestyle from pulmonary fibrosis is making him more anxious and depressed.  In fact his anxiety and depression scores have gone up.  He feels portable oxygen will help him.  He wants portable oxygen.  I offered mental health counseling but he wants to try portable oxygen first.  He is willing to talk to the support group leader.  He has questions about life expectancy which I told him and the natural history would be few to several years.  The hope is that these antifibrotic's and keep him stable to new medications, long.  Also expressed uncertainty with the course.  He processed all this.  He is frustrated by his disease.  He again had questions about the goal of the inhaled treprostinil which I explained.  At this point in time he is accepted using the oxygen but wants portable.  He will use inhaled  treprostinil and also the pirfenidone.  He will check liver function test today.  Were  not sure of the rehabilitation helped him.  His main purpose right now is to be able to get out of the house and have improvements in his anxiety and depression.  We discussed clinical trials is not eligible because of his cancer history.       OV 03/21/2021  Subjective:  Patient ID: Garrett Peterson, male , DOB: 09/02/1945 , age 59 y.o. , MRN: [id number] , ADDRESS: [place] [id number] PCP Albina Billet, MD Patient Care Team: Albina Billet, MD as PCP - General (Internal Medicine) End, Harrell Gave, MD as PCP - Cardiology (Cardiology) Clent Jacks, RN as Oncology Nurse Navigator Earlie Server, MD as Consulting Physician (Oncology)  This Provider for this visit: Treatment Team:  Attending Provider: Brand Males, MD    03/21/2021 -   Chief Complaint  Patient presents with   Follow-up    Pt states his breathing has become worse since last visit.    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 -returns for follow-up with his grandson [name].  He tells me for the last 10 days or so starting around 03/09/2021 he started noticing increasing shortness of breath with exertion.  He uses 6 L and sometimes evenw with that with ex he drops to 70s  He does ADLs.  He says it takes around 25 minutes for him to take a shower and change his clothes.  4 to 6 weeks ago it used to take 18 minutes or 20 minutes.  Definitely takes longer.  He finds it slower to go to the mailbox and come back but nevertheless he is able to do all his  ADLs.He is tolerating time also quite well taking at all times each time 4 times daily.  No side effects.  He is tolerating pirfenidone at 2 pills 3 times daily.  His latest CT scan of the chest was in February 2022.  His last pulmonary function test was in August 2022.  Noted below that his symptoms were significantly worse.  We did a walking desaturation test and shows even on 8L 150 feet x 3 laps he desaturates   He did have a right-sided neck sub auricular area -skin biopsy on 03/05/2021.  I reviewed the result.  It shows squamous cell carcinoma.  He is waiting a call back from the dermatologist/surgeon.  But he and his grandson seem aware of the result.  He does have energy back up athome    Last CT FEb 2022 with progression of ILd/IPF since Oct 2021 Last PFT aug 2022 with progresion since march 2022 Cardiac cath May 2022 shows slightly reduced EF [January 2022 echocardiogram EF 66%] Not on anticoagulation No worsening edema Last creatinine 0.9 mg percent June 2022 Normal liver function test September 2022 Normal hemoglobin May 2022 last checked on     PFT  OV 04/25/2021  Subjective:  Patient ID: Garrett Peterson, male , DOB: Nov 26, 1945 ,  age 69 y.o. , MRN: [id number] , ADDRESS: [place] [id number] PCP Albina Billet, MD Patient Care Team: Albina Billet, MD as PCP - General (Internal Medicine) End, Harrell Gave, MD as PCP - Cardiology (Cardiology) Clent Jacks, RN as Oncology Nurse Navigator Earlie Server, MD as Consulting Physician (Oncology)  This Provider for this visit: Treatment Team:  Attending Provider: Brand Males, MD    04/25/2021 -   Chief Complaint  Patient presents with   Follow-up    PFT performed today.  Pt still has complaints of worsening SOB.     HPI Garrett Peterson 76 y.o. -presents with his grandson Donato Schultz to discuss test results. PFT since earlier in the year : 36% decline in DLCO, 6% declne In FVC - avg 21%  decline in early 2022 to current. BUt CT chest deemed stable through 2022 though new onset and definitely worse since 2021. Blood work ok. ECHO - ok. No new issues since last visit. No worsening of symptoms. Last visit adised hm to use 4L [place] rest but increaes to avoid dessaturaion with exertion but he and grandson tell me he is not titrating up. ADvised this could be a safety issue  Conversation then followed on goals of care and expectations     PFT  PFT Results Latest Ref Rng & Units 04/25/2021 12/19/2020 08/02/2020  FVC-Pre L 1.51 1.54 1.61  FVC-Predicted Pre % 39 39 41  Pre FEV1/FVC % % 90 85 96  FEV1-Pre L 1.36 1.31 1.55  FEV1-Predicted Pre % 49 47 55  DLCO uncorrected ml/min/mmHg 7.61 13.12 12.41  DLCO UNC% % 32 55 52  DLCO corrected ml/min/mmHg 7.79 13.12 12.27  DLCO COR %Predicted % 33 55 52  DLVA Predicted % 91 91 103     HRCT NOV 2022 CLINICAL DATA:  76 year old male with history of interstitial lung disease. Chronic hypoxemic respiratory failure.   EXAM: CT CHEST WITHOUT CONTRAST   TECHNIQUE: Multidetector CT imaging of the chest was performed following the standard protocol without intravenous contrast. High resolution imaging of the lungs, as well as inspiratory and expiratory imaging, was performed.   COMPARISON:  Multiple priors, most recently chest CT 06/23/2020.   FINDINGS: Cardiovascular: Heart size is mildly enlarged. There is no significant pericardial fluid, thickening or pericardial calcification. There is aortic atherosclerosis, as well as atherosclerosis of the great vessels of the mediastinum and the coronary arteries, including calcified atherosclerotic plaque in the left main, left anterior descending, left circumflex and right coronary arteries. Status post median sternotomy for CABG including LIMA to the LAD. Mild calcifications of the aortic valve. Right internal jugular single-lumen porta cath with tip terminating in the mid superior vena  cava.   Mediastinum/Nodes: No pathologically enlarged mediastinal or hilar lymph nodes. Please note that accurate exclusion of hilar adenopathy is limited on noncontrast CT scans. Esophagus is unremarkable in appearance. No axillary lymphadenopathy.   Lungs/Pleura: High-resolution images again demonstrate widespread areas of ground-glass attenuation, septal thickening, cylindrical and varicose bronchiectasis, severe thickening of the peribronchovascular interstitium, regional architectural distortion and some parenchymal banding. No frank honeycombing is confidently identified. There is once again a definitive craniocaudal gradient. Inspiratory and expiratory is unremarkable. The findings appear essentially stable compared to prior study from 06/23/2020.   Upper Abdomen: Aortic atherosclerosis. Status post cholecystectomy. Incompletely imaged exophytic low-attenuation lesion extending off the anterior aspect of the upper pole of the left kidney measuring at least 8.7 cm in diameter, incompletely characterized on today's non-contrast  CT examination, but statistically likely to represent a large cyst.   Musculoskeletal: Median sternotomy wires. There are no aggressive appearing lytic or blastic lesions noted in the visualized portions of the skeleton.   IMPRESSION: 1. Severe chronic interstitial lung disease redemonstrated, as above. 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 However, given the stability of the findings compared to the prior study in the lack of frank honeycombing, an alternative etiology is difficult to exclude, particularly in light of the rapid development (very little disease was present on prior study from 09/06/2019). Repeat high-resolution chest CT is recommended in 12 months to assess for temporal changes in the appearance of the lung  parenchyma. 2. Mild cardiomegaly. 3. Aortic atherosclerosis, in addition to left main and 3 vessel coronary artery disease. Status post median sternotomy for CABG including LIMA to the LAD. 4. There are calcifications of the aortic valve. Echocardiographic correlation for evaluation of potential valvular dysfunction may be warranted if clinically indicated.   Aortic Atherosclerosis (ICD10-I70.0).     Electronically Signed   By: Vinnie Langton M.D.   On: 03/23/2021 05:22    Xxxx  ECHO 03/28/21  IMPRESSIONS     1. Left ventricular ejection fraction, by estimation, is 50 to 55%. The  left ventricle has low normal function. The left ventricle has no regional  wall motion abnormalities. Left ventricular diastolic parameters were  normal. The average left ventricular   global longitudinal strain is -18.2 %. The global longitudinal strain is  normal.   2. Right ventricular systolic function is low normal. The right  ventricular size is normal.   3. The mitral valve is normal in structure. No evidence of mitral valve  regurgitation.   4. The aortic valve was not well visualized. Aortic valve regurgitation  is not visualized.   5. The inferior vena cava is normal in size with greater than 50%  respiratory variability, suggesting right atrial pressure of 3 mmHg.    Xxxx  BLood  Latest Reference Range & Units 09/20/11 11:04 09/06/19 12:00 09/21/19 06:26 10/27/19 08:21 11/02/19 08:00 11/09/19 10:09 11/17/19 08:12 12/01/19 08:14 12/08/19 08:32 12/15/19 08:12 12/29/19 08:10 01/05/20 08:09 01/19/20 08:15 02/02/20 08:17 02/23/20 08:13 03/03/20 09:30 03/04/20 06:11 03/20/20 08:45 05/15/20 12:02 05/16/20 10:18 05/17/20 00:25 05/18/20 05:00 06/14/20 11:46 08/15/20 08:58 09/27/20 12:59 10/13/20 10:43 03/21/21 11:30  Creatinine 0.40 - 1.50 mg/dL 0.99 1.10 1.17 0.90 0.83 0.96 0.92 0.93 0.91 0.89 0.87 0.88 0.78 0.94 0.84 0.89 0.94 0.84 1.10 1.12 1.11 1.08 1.20 0.97 0.77 0.93 1.08    Latest  Reference Range & Units 09/06/19 12:00 09/21/19 06:26 10/18/19 11:00 11/02/19 08:00 11/09/19 10:09 11/17/19 08:12 11/24/19 11:03 12/01/19 08:14 12/08/19 08:32 12/15/19 08:12 12/29/19 08:10 01/05/20 08:09 01/19/20 08:15 02/02/20 08:17 02/09/20 10:52 02/23/20 08:13 03/03/20 09:30 03/04/20 06:11 03/20/20 08:45 04/20/20 09:33 05/16/20 10:18 05/17/20 00:25 05/18/20 05:00 06/14/20 11:46 09/21/20 10:05 09/27/20 12:59 03/21/21 11:30  Hemoglobin 13.0 - 17.0 g/dL 14.9 12.6 (L) 13.5 12.5 (L) 12.8 (L) 13.2 12.6 (L) 13.1 12.8 (L) 13.5 12.7 (L) 13.1 13.9 14.4 14.1 14.6 15.6 14.1 14.3 15.3 15.0 15.1 15.0 15.0 13.5 14.2 13.8    OV 06/28/2021  Subjective:  Patient ID: Garrett Peterson, male , DOB: [birth year] , age 2 y.o. , MRN: [id number] , ADDRESS: [place] [id number] PCP Albina Billet, MD Patient Care Team: Albina Billet, MD as PCP - General (Internal Medicine) End, Harrell Gave,  MD as PCP - Cardiology (Cardiology) Clent Jacks, RN as Oncology Nurse Navigator Earlie Server, MD as Consulting Physician (Oncology)  This Provider for this visit: Treatment Team:  Attending Provider: Brand Males, MD    06/28/2021 -   Chief Complaint  Patient presents with   Follow-up    Pt was recently in the hospital due to his breathing. States he is feeling better since he has been out.     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 -returns for follow-up after his last visit earlier this month  [facility].  According to him the high-dose prednisone helped.  I did discuss with Dr. DErin Fullingthe pulmonologist who took care of him.  According to Dr. DRaeanne Barrydoubling up the Lasix helped.  Patient wants to consider higher dose maintenance prednisone [today I realized that he is on daily 5 mg prednisone] even before the admission].  At this point in time he feels he is down to 6 L nasal cannula.  His grandson RTommie Raymondis concerned that patient oxygen device is far away and 6 L is not adequate.  Last time when we walked him on 8 L he was able to walk all 3 laps before he desaturated.  Today we walked him on 6 L and he desaturated 1 lap.  I indicated to him and the grandson that this is a safety guardrail as to how much she could do with 6 L.  But did indicate to them that have to monitor his pulse ox level at all times and to keep it over 88% is the best safety guardrail.  They are to adjust oxygen accordingly.  GYolanda Bonineis concerned that patient's trying to ration oxygen and trying to reduce the oxygen use and this is unsafe.  I did express to the patient that if the oxygen levels dropped precipitously he could fall and have a syncopal episode and passed away or have injuries.  He is tolerating pirfenidone fine He is tolerating inhaled treprostinil fine.  He is on ultrasonic nebulizer.  Taking it over 9 times each time 4 times a day.  He is interested in DPI   In April 2023 his GI doctor is trying to a surveillance colonoscopy and they wanted to know the risk.  I did express concern given his high level oxygen that he will need general anesthesia and it has to be a short procedure.  Did let him know that 76year old patient on 10 L oxygen did undergo colonoscopy for prelung transplant reasons and did well.  But I did tell him that I would need to talk to Dr. TAlice Reichertdirectly     [place]- ILD  07/04/2020  08/02/2020  12/19/2020 Tyvaso since early July 2022 Off ofev 02/02/2021 Tyvasos Esbiret 2 pills tid 03/21/2021 Tryvaso 12 times qid, and esbriet 2  pills tid 04/25/2021 Tyvaso, esbriet  and o2  O2 use 4L  4L 4L 4L rest, 5L ex 4L rest, 6L ex but is needed 8-10 when tested   Shortness of Breath 0 -> 5 scale with 5 being worst (score 6 If unable to do)       At rest 1 0 5 2._0 Simple tasks - showers, clothes change, eating, shaving 4 3 3._1 Household (dishes, doing bed, laundry) x x x x 4 6  Shopping x x x x 4 6  Walking level at own pace 3 3 x x (3.5) 3.5  Walking up Stairs _2 Total (30-36) Dyspnea Score 12 10 13.5 11.5 19 21.5  How bad is your cough? 0 0 2  0 0  How bad is your fatigue _3 How bad is nausea 0 0 0 0 0 0  How bad is vomiting?  0 0 0 0 0 0  How bad is diarrhea? 0 0 1 0 0 0  How bad is anxiety? _4 How bad is depression 5 3 2._5 Simple office walk 185 feet x  3 laps goal with forehead probe 03/21/2021  03/21/2021  06/28/2021   O2 used 4L 8L 6L C  Number laps completed 1 lap only 3 laps   Comments about pace avvg pace    Resting Pulse Ox/HR 100% and 59/min  100% on 6L  Final Pulse Ox/HR 86% and 81/min 84% at end of 3rd lap on 8L 84% at end of 1 laps  Desaturated </= 88% yes    Desaturated <= 3% points yes    Got Tachycardic >/= 90/min no    Symptoms at end of test dyspnea    Miscellaneous comments worse       PFT  PFT Results Latest Ref Rng & Units 04/25/2021 12/19/2020 08/02/2020  FVC-Pre L 1.51 1.54 1.61  FVC-Predicted Pre % 39 39 41  Pre FEV1/FVC % % 90 85 96  FEV1-Pre L 1.36 1.31 1.55  FEV1-Predicted Pre % 49 47 55  DLCO uncorrected ml/min/mmHg 7.61 13.12 12.41  DLCO UNC% % 32 55 52  DLCO corrected ml/min/mmHg 7.79 13.12 12.27  DLCO COR %Predicted % 33 55 52  DLVA Predicted % 91 91 103       has a past medical history of (HFpEF) heart failure with preserved ejection fraction ([place]), Actinic  keratosis, Adenomatous polyp of colon, Arthritis, Basal cell carcinoma, CAD (coronary artery disease), Cancer ([place]) (2021), Hyperlipidemia, Hypertension, Interstitial lung disease ([place]), Pneumonia, Pre-diabetes, Respiratory failure with hypoxia ([place]), and 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   reports that he has never smoked. He has quit using smokeless tobacco.  His smokeless tobacco use included chew.  Past Surgical History:  Procedure Laterality Date   APPENDECTOMY     BRONCHIAL WASHINGS  05/18/2020   Procedure: BRONCHIAL WASHINGS;  Surgeon: Margaretha Seeds, MD;  Location: [facility];  Service: Pulmonary;;   CARDIAC CATHETERIZATION     PT DENIES   COLONOSCOPY     2011,2015,2021   COLONOSCOPY WITH PROPOFOL N/A 07/10/2020   Procedure: COLONOSCOPY WITH PROPOFOL;  Surgeon: Toledo, Benay Pike, MD;  Location: [facility];  Service: Gastroenterology;  Laterality: N/A;   CORONARY ARTERY BYPASS GRAFT     FLEXIBLE BRONCHOSCOPY N/A 04/24/2020   Procedure: FLEXIBLE BRONCHOSCOPY;  Surgeon: Ottie Glazier, MD;  Location: [facility];  Service: Thoracic;  Laterality: N/A;   IR SINUS/FIST TUBE CHK-NON GI  10/27/2019   open heart surgery     PORTACATH PLACEMENT Right 10/13/2019   Procedure: INSERTION PORT-A-CATH;  Surgeon: Herbert Pun, MD;  Location: [facility];  Service: General;  Laterality: Right;   RIGHT/LEFT HEART CATH AND CORONARY ANGIOGRAPHY N/A 10/03/2020   Procedure: RIGHT/LEFT HEART CATH AND CORONARY ANGIOGRAPHY;  Surgeon: Nelva Bush, MD;  Location: [facility];  Service: Cardiovascular;  Laterality: N/A;   ROBOT ASSISTED LAPAROSCOPIC PARTIAL COLECTOMY  09/2019   VIDEO BRONCHOSCOPY N/A 05/18/2020   Procedure: VIDEO BRONCHOSCOPY WITHOUT FLUORO;  Surgeon: Margaretha Seeds, MD;  Location: [facility];  Service: Pulmonary;  Laterality: N/A;    Allergies  Allergen Reactions   Ofev [Nintedanib] Diarrhea and Nausea Only    Immunization History  Administered  Date(s) Administered   Influenza, High Dose Seasonal PF 03/19/2021   PFIZER(Purple Top)SARS-COV-2 Vaccination 06/09/2019, 06/30/2019, 01/18/2020   Zoster Recombinat (Shingrix) 01/12/2018, 03/21/2018    Family History  Problem Relation Age of Onset   Lung disease Father      Current Outpatient Medications:    aspirin EC 81 MG tablet, Take 1 tablet (81 mg total) by mouth daily. Swallow whole., Disp: , Rfl:    atorvastatin (LIPITOR) 20 MG tablet, Take 20 mg by mouth daily., Disp: , Rfl:    furosemide (LASIX) 40 MG tablet, Take 1 tablet (40 mg total) by mouth 2 (two) times daily for 7 days., Disp: 30 tablet, Rfl: 5   metoprolol tartrate (LOPRESSOR) 25 MG tablet, Take 25 mg by mouth 2 (two) times daily., Disp: , Rfl:    Pirfenidone (ESBRIET) 267 MG TABS, Take 2 tablets (534 mg total) by mouth in the morning, at noon, and at bedtime., Disp: 540 tablet, Rfl: 3   predniSONE (DELTASONE) 10 MG tablet, Take 1 tablet (10 mg total) by mouth daily with breakfast., Disp: 14 tablet, Rfl: 0   predniSONE (DELTASONE) 5 MG tablet, Take 1 tablet (5 mg total) by mouth daily with breakfast., Disp: 30 tablet, Rfl: 2   temazepam (RESTORIL) 30 MG capsule, Take 30 mg by mouth at bedtime. , Disp: , Rfl:    Treprostinil (TYVASO) 0.6 MG/ML SOLN, Inhale into the lungs 4 (four) times daily., Disp: , Rfl:       Objective:   Vitals:   06/28/21 1014  BP: 124/70  Pulse: 67  Temp: 97.8 F (36.6 C)  TempSrc: Oral  SpO2: 100%  Weight: 215 lb 12.8 oz (97.9 kg)  Height: _0  (1.753 m)    Estimated body mass index is 31.87 kg/m as calculated from the following:   Height as of this encounter: _1  (1.753 m).   Weight as of this encounter: 215 lb 12.8 oz (97.9 kg).  _2 @  Filed Weights   06/28/21 1014  Weight: 215 lb 12.8 oz (97.9 kg)     Physical Exam    General: No distress.  Sitting in the wheelchair.  Looking like before. Neuro: Alert and Oriented x 3. GCS 15. Speech normal Psych:  Pleasant Resp:  Barrel Chest - no.  Wheeze - no, Crackles - yes, No overt respiratory distress CVS: Normal heart sounds. Murmurs - no Ext: Stigmata of Connective Tissue Disease - no HEENT: Normal upper airway. PEERL +. No post nasal drip. EDema +        Assessment:       ICD-10-CM   1. IPF (idiopathic pulmonary fibrosis) ([place])  Q72.257  2. Chronic hypoxemic respiratory failure (HCC)  J96.11     3. WHO group 3 pulmonary arterial hypertension (HCC)  I27.23     4. Preop respiratory exam  Z01.811          Plan:     Patient Instructions  IPF (idiopathic pulmonary fibrosis) ([place])   - clinically beter after recent flare up - understand you want to try higher dose prednisone again.  Plan - Continue pirfenidone as before - Prednisone burst as follows Please take Take prednisone 34m once daily x 3 days, then 35monce daily x 3 days, then 201mnce daily x 3 days, then prednisone 1m65mce daily  x 3 days and return to 5 mg baseline dose  -We discussed the long-term harmful implications of chronic prednisone in IPF patients   WHO group 3 pulmonary arterial hypertension (HCC)  -Stable  Plan - Continue inhaled Tyvaso but will send a message to pharmacist to change you to the DPI formulation instead of nebulizer  Chronic hypoxemic respiratory failure (HCC)[place] to needing only 6 L nasal cannula at rest  Plan - Always monitor your pulse ox with exertion and keep it over 88% - We will walk you on 6 L nasal cannula today to see distance to desaturation to give you some guardrail safeguards   Preop respiratory exam -requiring colonoscopy in April 2023 for cancer surveillance  Plan - I will reach out to Dr. ToleAlice Reichertut the risk versus benefit of doing this.  Follow-up - 6-8 weeks with Dr. RamaChase Callere-to-face or video visit 30 minutes   ( Level 05 visit: Estb 40-54 min   in  visit type: on-site physical face to visit  in total care time and counseling or/and  coordination of care by this undersigned MD - Dr MuraBrand Malesis includes one or more of the following on this same day 06/28/2021: pre-charting, chart review, note writing, documentation discussion of test results, diagnostic or treatment recommendations, prognosis, risks and benefits of management options, instructions, education, compliance or risk-factor reduction. It excludes time spent by the C[facility] [place]office staff in the care of the patient. Actual time 45 m77)    SIGNATURE    Dr. MuraBrand MalesD., F.C.C.P,  Pulmonary and Critical Care Medicine Staff Physician, [facility] - Interstitial Lung Disease  Program  [place] [place], [id number]: [phone] no answer or between  15:00h - 7:00h: call [phone] Telephone: [phone]  10:47 AM 06/28/2021

## 2021-06-28 NOTE — Patient Instructions (Addendum)
IPF (idiopathic pulmonary fibrosis) ([place])   - clinically beter after recent flare up - understand you want to try higher dose prednisone again.  Plan - Continue pirfenidone as before - Prednisone burst as follows Please take Take prednisone 35m once daily x 3 days, then 34monce daily x 3 days, then 2043mnce daily x 3 days, then prednisone 38m38mce daily  x 3 days and return to 5 mg baseline dose  -We discussed the long-term harmful implications of chronic prednisone in IPF patients   WHO group 3 pulmonary arterial hypertension (HCC)  -Stable  Plan - Continue inhaled Tyvaso but will send a message to pharmacist to change you to the DPI formulation instead of nebulizer - continue lasix 40mg57mce daily  Chronic hypoxemic respiratory failure (HCC)  -Seems to needing only 6 L nasal cannula at rest  Plan - Always monitor your pulse ox with exertion and keep it over 88% - We will walk you on 6 L nasal cannula today to see distance to desaturation to give you some guardrail safeguards   Preop respiratory exam -requiring colonoscopy in April 2023 for cancer surveillance  Plan - I will reach out to Dr. ToledAlice Reichertt the risk versus benefit of doing this.  Follow-up - 6-8 weeks with Dr. RamasChase Caller-to-face or video visit 30 minutes

## 2021-06-28 NOTE — Telephone Encounter (Signed)
Devki  Reg "[name]  1) could we please change the inhaled treprostinil to DPI from ultrasonic nebulizer -he is very interested in this  2) also I just realized today that he is on chronic prednisone even before recent flareup in February 2023.  Could you please find out how long he has been on chronic prednisone and the indication and the dosage  Thanks    SIGNATURE    Dr. Brand Males, M.D., F.C.C.P,  Pulmonary and Critical Care Medicine Staff Physician, Early Director - Interstitial Lung Disease  Program  [facility] [place] [facility] [place], [place], [address]  NPI Number:  NPI #[id number] [place] Number: [id number]  Pager: [phone], If no answer  -> Check AMION or Try [phone] Telephone (clinical office): [phone] Telephone (research): [phone]  10:47 AM 06/28/2021

## 2021-07-03 ENCOUNTER — Other Ambulatory Visit: Payer: Self-pay

## 2021-07-03 ENCOUNTER — Inpatient Hospital Stay: Payer: Medicare Other | Attending: Oncology

## 2021-07-03 DIAGNOSIS — C20 Malignant neoplasm of rectum: Secondary | ICD-10-CM | POA: Diagnosis present

## 2021-07-03 LAB — COMPREHENSIVE METABOLIC PANEL
ALT: 190 U/L — ABNORMAL HIGH (ref 0–44)
AST: 118 U/L — ABNORMAL HIGH (ref 15–41)
Albumin: 3.7 g/dL (ref 3.5–5.0)
Alkaline Phosphatase: 187 U/L — ABNORMAL HIGH (ref 38–126)
Anion gap: 7 (ref 5–15)
BUN: 17 mg/dL (ref 8–23)
CO2: 35 mmol/L — ABNORMAL HIGH (ref 22–32)
Calcium: 8.7 mg/dL — ABNORMAL LOW (ref 8.9–10.3)
Chloride: 93 mmol/L — ABNORMAL LOW (ref 98–111)
Creatinine, Ser: 1.12 mg/dL (ref 0.61–1.24)
GFR, Estimated: >60 mL/min (ref >60)
Glucose, Bld: 418 mg/dL — ABNORMAL HIGH (ref 70–99)
Potassium: 4.1 mmol/L (ref 3.5–5.1)
Sodium: 135 mmol/L (ref 135–145)
Total Bilirubin: 0.5 mg/dL (ref 0.3–1.2)
Total Protein: 7.1 g/dL (ref 6.5–8.1)

## 2021-07-03 LAB — CBC WITH DIFFERENTIAL/PLATELET
Abs Immature Granulocytes: 0.08 10*3/uL — ABNORMAL HIGH (ref 0.00–0.07)
Basophils Absolute: 0 10*3/uL (ref 0.0–0.1)
Basophils Relative: 0 %
Eosinophils Absolute: 0 10*3/uL (ref 0.0–0.5)
Eosinophils Relative: 0 %
HCT: 42 % (ref 39.0–52.0)
Hemoglobin: 14.1 g/dL (ref 13.0–17.0)
Immature Granulocytes: 1 %
Lymphocytes Relative: 6 %
Lymphs Abs: 1 10*3/uL (ref 0.7–4.0)
MCH: 33.1 pg (ref 26.0–34.0)
MCHC: 33.6 g/dL (ref 30.0–36.0)
MCV: 98.6 fL (ref 80.0–100.0)
Monocytes Absolute: 0.6 10*3/uL (ref 0.1–1.0)
Monocytes Relative: 4 %
Neutro Abs: 14.3 10*3/uL — ABNORMAL HIGH (ref 1.7–7.7)
Neutrophils Relative %: 89 %
Platelets: 165 10*3/uL (ref 150–400)
RBC: 4.26 MIL/uL (ref 4.22–5.81)
RDW: 12.6 % (ref 11.5–15.5)
WBC: 16.1 10*3/uL — ABNORMAL HIGH (ref 4.0–10.5)
nRBC: 0 % (ref 0.0–0.2)

## 2021-07-04 LAB — CEA: CEA: 600 ng/mL — ABNORMAL HIGH (ref 0.0–4.7)

## 2021-07-05 ENCOUNTER — Inpatient Hospital Stay (HOSPITAL_BASED_OUTPATIENT_CLINIC_OR_DEPARTMENT_OTHER): Payer: Medicare Other | Admitting: Oncology

## 2021-07-05 ENCOUNTER — Encounter: Payer: Self-pay | Admitting: Oncology

## 2021-07-05 ENCOUNTER — Other Ambulatory Visit: Payer: Self-pay

## 2021-07-05 VITALS — BP 135/87 | HR 60 | Temp 98.3°F | Wt 218.0 lb

## 2021-07-05 DIAGNOSIS — J9611 Chronic respiratory failure with hypoxia: Secondary | ICD-10-CM

## 2021-07-05 DIAGNOSIS — R748 Abnormal levels of other serum enzymes: Secondary | ICD-10-CM | POA: Diagnosis not present

## 2021-07-05 DIAGNOSIS — C20 Malignant neoplasm of rectum: Secondary | ICD-10-CM | POA: Diagnosis not present

## 2021-07-05 DIAGNOSIS — Z7189 Other specified counseling: Secondary | ICD-10-CM

## 2021-07-05 DIAGNOSIS — Z95828 Presence of other vascular implants and grafts: Secondary | ICD-10-CM | POA: Diagnosis not present

## 2021-07-05 NOTE — Progress Notes (Signed)
.  Hematology/Oncology Progress note [phone] [phone]      Patient Care Team: Albina Billet, MD as PCP - General (Internal Medicine) End, Harrell Gave, MD as PCP - Cardiology (Cardiology) Clent Jacks, RN as Oncology Nurse Navigator Earlie Server, MD as Consulting Physician (Oncology)  REFERRING PROVIDER: Albina Billet, MD  CHIEF COMPLAINTS/REASON FOR VISIT:  Follow-up for colorectal cancer  HISTORY OF PRESENTING ILLNESS:   Garrett Peterson is a  76 y.o.  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 Alice Reichert for follow-up on history of colon polyps.  Patient also has reported recent history of change of bowel habits.  He has felt more constipated. No blood in the stool, abdominal pain, unintentional weight loss or fever or chills. Colonoscopy records is at [facility] clinic and is not available to me. 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  MMR is deferred to resection specimen.  # 09/20/2019, patient underwent robotic assisted laparoscopic low anterior resection. Pathology report was reviewed by me and discussed with patient and his wife. I also called pathology and discussed with Dr. Reuel Derby. The tumor is located 1.5 cm distal to the rectosigmoid junction.  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  I explained to patient the different final diagnosis of cancer of rectum rather than endoscopic impression of distal sigmoid colon cancer.  Tumor was not visualized on preop CT scan.  -pT3 pN1 M0, grade 2 adenocarcinoma. MMR status is intact  All margins were uninvolved by invasive carcinoma, high-grade dysplasia/intramucosal adenocarcinoma and low-grade dysplastic.  1 out of 8 regional lymph nodes were positive for malignancy. Patient  has lymphovascular invasion as well as perineural invasion. I discussed with patient that ideally his rectal cancer would be better treated with total neoadjuvant treatment prior to the surgery if we would know his final diagnosis will be an upper rectal cancer.  Preoperative treatments is associated with a more favorable long-term toxicity profile and fever local recurrence then postoperative therapy.  However overall survival appears to be similar.In this case, I would recommend chemotherapy and adjuvant chemo- radiation  # 10/13/2019 patient had CT abdomen pelvis with contrast for evaluation of concern of peritonitis and bowel perforation.  Pneumoperitoneum was seen on earlier radiograph.  There was moderate pneumoperitoneum, extensive colonic diverticulosis.  5.2 x 4.1 cm loculated fluid collection in the left lower abdomen concerning for an infected collection or abscess. Patient had a drain placed and finished a course of antibiotics.  Draining catheter was pulled.  #11/02/2019, patient started adjuvant FOLFOX treatments. Finished 7 cycles of adjuvant FOLFOX.  Cycle 8 treatment was held as patient was admitted due to pneumonia, respiratory failure due to his recent Covid 19 infection.  Due to refractory respiratory symptoms, he did not finish planned chemotherapy.  He proceeded with radiation treatments.  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  Discussed with the patient and encourage him to further discuss with primary care provider for the need of see pulmonology  INTERVAL HISTORY Garrett Peterson is a 76 y.o. male who has above history reviewed by me today presents for follow up visit for management of rectal cancer. he was last seen via virtual visit 1 year ago.  Patient declined additional appointment of oncology follow-up and a CT scan in  June 2022.  Patient presents to reestablish care.  Patient's health continues to deteriorate due to  interstitial lung disease, chronic respiratory failure, currently on 8 L of oxygen via nasal cannula, preserved EF heart failure, frequent hospitalization. Reports feeling shortness of breath with minimal exertion.  He follows up with pulmonology Dr. Patrecia Pour.  Patient is on tapering course of steroids. Denies any unintentional weight loss, abdominal pain, change of bowel habits, blood in the stool.  Review of Systems  Constitutional:  Positive for fatigue. Negative for appetite change, chills, fever and unexpected weight change.  HENT:   Negative for hearing loss and voice change.   Eyes:  Negative for eye problems and icterus.  Respiratory:  Negative for chest tightness, cough and shortness of breath.   Cardiovascular:  Negative for chest pain and leg swelling.  Gastrointestinal:  Negative for abdominal distention and abdominal pain.  Endocrine: Negative for hot flashes.  Genitourinary:  Negative for difficulty urinating, dysuria and frequency.   Musculoskeletal:  Negative for arthralgias.  Skin:  Negative for itching and rash.  Neurological:  Negative for light-headedness and numbness.  Hematological:  Negative for adenopathy. Does not bruise/bleed easily.  Psychiatric/Behavioral:  Negative for confusion.    MEDICAL HISTORY:  Past Medical History:  Diagnosis Date   (HFpEF) heart failure with preserved ejection fraction ([place])    a. 05/2020 Echo: EF 55-60%, no rwma, Gr1 DD, mildly red RV fxn; b. 09/2020 RHC: PCWP 38mHg. CO/CI 4.7/2.1.   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 09/2020 Cath: LM 70d, LAD 70ost, 80/766mLCX 100ost/p, 99p/m, RCA 100ost/p, VG->OM2 4030mIMA->LAD nl, VG->RPDA nl-->Med Rx.   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   right scalp/neck postauricular - [facility]    SURGICAL HISTORY: Past Surgical History:  Procedure Laterality Date   APPENDECTOMY     BRONCHIAL WASHINGS  05/18/2020   Procedure: BRONCHIAL WASHINGS;  Surgeon: EllMargaretha SeedsD;  [facility];;   CARDIAC CATHETERIZATION     PT DENIES   COLONOSCOPY     2011,2015,2021   COLONOSCOPY WITH PROPOFOL N/A 07/10/2020   Procedure: COLONOSCOPY WITH PROPOFOL;  Surgeon: Toledo, TeoBenay PikeD;  Location: [facility];  Service: Gastroenterology;  Laterality: N/A;   CORONARY ARTERY BYPASS GRAFT     FLEXIBLE BRONCHOSCOPY N/A 04/24/2020   Procedure: FLEXIBLE BRONCHOSCOPY;  Surgeon: AleOttie GlazierD;  Location: [facility];  Service: Thoracic;  Laterality: N/A;   IR SINUS/FIST TUBE CHK-NON GI  10/27/2019   open heart surgery     PORTACATH PLACEMENT Right 10/13/2019   Procedure: INSERTION PORT-A-CATH;  Surgeon: CinHerbert PunD;  Location: [facility];  Service: General;  Laterality: Right;   RIGHT/LEFT HEART CATH AND CORONARY ANGIOGRAPHY N/A 10/03/2020   Procedure: RIGHT/LEFT HEART CATH AND CORONARY ANGIOGRAPHY;  Surgeon: EndNelva BushD;  [facility];  Service: Cardiovascular;  Laterality: N/A;   ROBOT ASSISTED LAPAROSCOPIC PARTIAL COLECTOMY  09/2019   VIDEO BRONCHOSCOPY N/A 05/18/2020   Procedure: VIDEO BRONCHOSCOPY WITHOUT FLUORO;  Surgeon: EllMargaretha SeedsD;  [facility];  Service: Pulmonary;  Laterality: N/A;    SOCIAL HISTORY: Social History   Socioeconomic History   Marital status: Married    Spouse name: Not on file   Number of children: Not on file   Years of education: Not on file   Highest education level: Not on file  Occupational History   Not on file  Tobacco Use   Smoking status: Never   Smokeless tobacco: Former    Types: Chew  Vaping Use   Vaping Use: Never used   Substance and Sexual Activity   Alcohol use: Yes    Alcohol/week: 0.0 standard drinks    Comment: OCCAS-not within the last year as of 09/27/2020   Drug use: Never   Sexual activity: Not Currently  Other Topics Concern   Not on file  Social History Narrative   Not on file   Social Determinants of Health   Financial Resource Strain: Not on file  Food Insecurity: Not on file  Transportation Needs: Not on file  Physical Activity: Not on file  Stress: Not on file  Social Connections: Not on file  Intimate Partner Violence: Not on file    FAMILY HISTORY: Family History  Problem Relation Age of Onset   Lung disease Father     ALLERGIES:  is allergic to ofev [nintedanib].  MEDICATIONS:  Current Outpatient Medications  Medication Sig Dispense Refill   aspirin EC 81 MG tablet Take 1 tablet (81 mg total) by mouth daily. Swallow whole.     atorvastatin (LIPITOR) 20 MG tablet Take 20 mg by mouth daily.     metoprolol tartrate (LOPRESSOR) 25 MG tablet Take 25 mg by mouth 2 (two) times daily.     Pirfenidone (ESBRIET) 267 MG TABS Take 2 tablets (534 mg total) by mouth in the morning, at noon, and at bedtime. 540 tablet 3   predniSONE (DELTASONE) 10 MG tablet Take 4 tablets (40 mg total) by mouth daily with breakfast for 3 days, THEN 3 tablets (30 mg total) daily with breakfast for 3 days, THEN 2 tablets (20 mg total) daily with breakfast for 3 days, THEN 1 tablet (10 mg total) daily with breakfast for 3 days. Then continue on 73m. 30 tablet 0   predniSONE (DELTASONE) 5 MG tablet Take 1 tablet (5 mg total) by mouth daily with breakfast. 30 tablet 2   temazepam (RESTORIL) 30 MG capsule Take 30 mg by mouth at bedtime.      Treprostinil (TYVASO) 0.6 MG/ML SOLN Inhale into the lungs 4 (four) times daily.     furosemide (LASIX) 40 MG tablet Take 1 tablet (40 mg total) by mouth 2 (two) times daily for 7 days. 30 tablet 5   No current facility-administered medications for this visit.      PHYSICAL EXAMINATION: ECOG PERFORMANCE STATUS: 2 - Symptomatic, <50% confined to bed Vitals:   07/05/21 1134  BP: 135/87  Pulse: 60  Temp: 98.3 F (36.8 C)  SpO2: 100%   Filed Weights   07/05/21 1134  Weight: 218 lb (98.9 kg)    Physical Exam Constitutional:      General: He is not in acute distress. HENT:     Head: Normocephalic and atraumatic.  Eyes:     General: No scleral icterus. Cardiovascular:     Rate and Rhythm: Normal rate and regular rhythm.     Heart sounds: Normal heart sounds.  Pulmonary:     Effort: Pulmonary effort is normal. No respiratory distress.  Breath sounds: No wheezing.     Comments: Bibasilar crackles.  Mild wheezing Abdominal:     General: Bowel sounds are normal. There is no distension.     Palpations: Abdomen is soft.  Musculoskeletal:        General: No deformity. Normal range of motion.     Cervical back: Normal range of motion and neck supple.  Skin:    General: Skin is warm and dry.     Findings: No erythema or rash.     Comments: Right anterior chest wall + Mediport  Neurological:     Mental Status: He is alert and oriented to person, place, and time. Mental status is at baseline.     Cranial Nerves: No cranial nerve deficit.     Coordination: Coordination normal.  Psychiatric:        Mood and Affect: Mood normal.    LABORATORY DATA:  I have reviewed the data as listed Lab Results  Component Value Date   WBC 16.1 (H) 07/03/2021   HGB 14.1 07/03/2021   HCT 42.0 07/03/2021   MCV 98.6 07/03/2021   PLT 165 07/03/2021   Recent Labs    09/21/20 1005 09/27/20 1259 02/02/21 1053 02/02/21 1053 03/21/21 1130 06/19/21 1156 06/19/21 1703 06/20/21 0543 06/21/21 0819 07/03/21 1308  NA  --    < >  --    < > 141 139  --  139 139 135  K  --    < >  --   --  3.9 3.5  --  3.9 3.8 4.1  CL  --    < >  --   --  98 97*  --  92* 95* 93*  CO2  --    < >  --   --  36* 35*  --  31 33* 35*  GLUCOSE  --    < >  --    < > 136*  141*  --  171* 231* 418*  BUN  --    < >  --    < > 10 10  --  _0 CREATININE  --    < >  --   --  1.08 0.77   < > 0.75 0.94 1.12  CALCIUM  --    < >  --   --  9.1 8.7*  --  9.8 9.1 8.7*  GFRNONAA  --   --   --   --   --  >60   < > >60 >60 >60  PROT 6.5  --  7.3  --  7.4 7.5  --  7.3  --  7.1  ALBUMIN 3.8  --  4.0  --  4.1 3.7  --  3.6  --  3.7  AST 34  --  24  --  27 26  --  24  --  118*  ALT 39  --  31  --  27 25  --  25  --  190*  ALKPHOS 97  --  122*  --  116 126  --  126  --  187*  BILITOT 0.5  --  0.4  --  0.5 0.5  --  0.4  --  0.5  BILIDIR 0.1  --  0.1  --  0.0  --   --   --   --   --    < > = values in this interval not displayed.    Iron/TIBC/Ferritin/ %Sat  Component Value Date/Time   IRON 100 09/06/2019 1200   TIBC 368 09/06/2019 1200   FERRITIN 198 09/06/2019 1200   IRONPCTSAT 27 09/06/2019 1200      RADIOGRAPHIC STUDIES: I have personally reviewed the radiological images as listed and agreed with the findings in the report. CT Angio Chest Pulmonary Embolism (PE) W or WO Contrast  Result Date: 06/20/2021 CLINICAL DATA:  Shortness of breath and left chest soreness. Elevated D-dimer. EXAM: CT ANGIOGRAPHY CHEST WITH CONTRAST TECHNIQUE: Multidetector CT imaging of the chest was performed using the standard protocol during bolus administration of intravenous contrast. Multiplanar CT image reconstructions and MIPs were obtained to evaluate the vascular anatomy. RADIATION DOSE REDUCTION: This exam was performed according to the departmental dose-optimization program which includes automated exposure control, adjustment of the mA and/or kV according to patient size and/or use of iterative reconstruction technique. CONTRAST:  69m OMNIPAQUE IOHEXOL 350 MG/ML SOLN COMPARISON:  Portable chest obtained earlier today. Chest CT dated 03/23/2021. FINDINGS: Cardiovascular: Stable enlarged heart. Stable post CABG changes. Atheromatous calcifications, including the coronary arteries and  aorta. Normally opacified pulmonary arteries with no pulmonary arterial filling defects seen. Mediastinum/Nodes: No enlarged mediastinal, hilar, or axillary lymph nodes. Thyroid gland, trachea, and esophagus demonstrate no significant findings. Lungs/Pleura: No significant change in previously demonstrated severe chronic interstitial lung disease and bronchiectasis. Mild posterior right upper lobe paraseptal emphysematous changes. No superimposed acute process. No pleural fluid. Upper Abdomen: Unremarkable. Musculoskeletal: Thoracic and lower cervical spine degenerative changes. Review of the MIP images confirms the above findings. IMPRESSION: 1. No pulmonary emboli or other acute abnormality. 2. Stable cardiomegaly and post CABG changes. 3. Stable severe chronic interstitial lung disease, bronchiectasis and paraseptal emphysema. 4. Dense calcific coronary artery and aortic atherosclerosis. Aortic Atherosclerosis (ICD10-I70.0) and Emphysema (ICD10-J43.9). Electronically Signed   By: SClaudie ReveringM.D.   On: 06/20/2021 10:53   DG Chest Port 1 View  Result Date: 06/19/2021 CLINICAL DATA:  Shortness of breath, hypoxia EXAM: PORTABLE CHEST 1 VIEW COMPARISON:  Previous studies including the radiographs done on 05/16/2020 and CT done on 03/22/2021 FINDINGS: Transverse diameter of heart is increased. Increased interstitial markings are seen in the mid and lower lung fields. Interstitial fibrosis was noted in the CT chest done on 03/22/2021. Upper lung fields are unremarkable. There is poor inspiration. There is no significant pleural effusion or pneumothorax. Tip of central venous catheter is seen in the superior vena cava. 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 IMPRESSION: Increased interstitial markings are seen in the mid and lower lung fields suggesting pulmonary fibrosis. Possibility of superimposed interstitial pneumonia is not excluded. Electronically Signed   By: PElmer PickerM.D.   On: 06/19/2021  11:55       ASSESSMENT & PLAN:  1. Rectal cancer ([place]   2. Port-A-Cath in place   3. Chronic respiratory failure with hypoxia (HCC)   4. Elevated liver enzymes   5. Goals of care, counseling/discussion    #Rectal cancer stage III Labs reviewed and discussed with patient.  CEA has significantly increased now in 600 ranges.  Suspect recurrence. Recommend CT abdomen pelvis with contrast.  He agrees with the plan. He has poor performance status, less likely candidate for aggressive treatments if recurrence is confirmed. We will arrange patient to see palliative care service for further discussion.  #Port-A-Cath in place, not compliant with port flush. #Chronic respiratory failure due to interstitial lung disease. On tapering course of steroid.  Continue follow-up with pulmonology. #Elevated liver enzymes,  Pending CT  scan.  Return of visit: A few days after CT scan.  Earlie Server, MD, PhD Hematology Oncology  07/05/2021

## 2021-07-06 ENCOUNTER — Ambulatory Visit
Admission: RE | Admit: 2021-07-06 | Discharge: 2021-07-06 | Disposition: A | Discharge status: Home / Self Care | Payer: Medicare Other | Source: Ambulatory Visit | Attending: Oncology | Admitting: Oncology

## 2021-07-06 DIAGNOSIS — C20 Malignant neoplasm of rectum: Secondary | ICD-10-CM | POA: Diagnosis present

## 2021-07-06 MED ORDER — IOHEXOL 300 MG/ML  SOLN
100.0000 mL | Freq: Once | INTRAMUSCULAR | Status: AC | PRN
  Administered 2021-07-06: 100 mL via INTRAVENOUS

## 2021-07-09 ENCOUNTER — Telehealth: Payer: Self-pay | Admitting: *Deleted

## 2021-07-09 NOTE — Telephone Encounter (Signed)
Called report  IMPRESSION: New hepatic metastatic disease since prior exam.   No other sites of metastatic disease identified within the abdomen or pelvis.   Colonic diverticulosis. No radiographic evidence of diverticulitis.   Chronic bibasilar interstitial lung disease and bronchiectasis.   Aortic Atherosclerosis (ICD10-I70.0).   These results will be called to the ordering clinician or representative by the Radiologist Assistant, and communication documented in the PACS or [employer].     Electronically Signed   By: Marlaine Hind M.D.   On: 07/08/2021 18:57

## 2021-07-10 ENCOUNTER — Inpatient Hospital Stay (HOSPITAL_BASED_OUTPATIENT_CLINIC_OR_DEPARTMENT_OTHER): Payer: Medicare Other | Admitting: Oncology

## 2021-07-10 ENCOUNTER — Encounter: Payer: Self-pay | Admitting: Oncology

## 2021-07-10 ENCOUNTER — Inpatient Hospital Stay (HOSPITAL_BASED_OUTPATIENT_CLINIC_OR_DEPARTMENT_OTHER): Payer: Medicare Other | Admitting: Hospice and Palliative Medicine

## 2021-07-10 ENCOUNTER — Other Ambulatory Visit: Payer: Self-pay

## 2021-07-10 VITALS — BP 145/70 | HR 79 | Temp 97.5°F | Wt 217.8 lb

## 2021-07-10 DIAGNOSIS — Z7189 Other specified counseling: Secondary | ICD-10-CM

## 2021-07-10 DIAGNOSIS — C20 Malignant neoplasm of rectum: Secondary | ICD-10-CM | POA: Diagnosis not present

## 2021-07-10 NOTE — Progress Notes (Signed)
[place] at [facility] [phone] [phone]   Name: Garrett Peterson Date: 07/10/2021 MRN: [id number]  DOB: Jul 01, 1945  Patient Care Team: Albina Billet, MD as PCP - General (Internal Medicine) End, Harrell Gave, MD as PCP - Cardiology (Cardiology) Clent Jacks, RN as Oncology Nurse Navigator Earlie Server, MD as Consulting Physician (Oncology)    REASON FOR CONSULTATION: SHELLIE GOETTL is a 76 y.o. male with multiple medical problems including interstitial lung disease, chronic respiratory failure on 8 L O2 at baseline, diastolic dysfunction with history of CHF, hypertension, and now recurrent stage IV rectal cancer status post colectomy.  Palliative care was consulted to help address goals.  SOCIAL HISTORY:     reports that he has never smoked. He has quit using smokeless tobacco.  His smokeless tobacco use included chew. He reports current alcohol use. He reports that he does not use drugs.  Patient is married and lives at home with his wife.  He has no biological children but has several stepchildren and grandchildren who are involved  ADVANCE DIRECTIVES:  On file  CODE STATUS: DNR  PAST MEDICAL HISTORY: Past Medical History:  Diagnosis Date   (HFpEF) heart failure with preserved ejection fraction ([place])    a. 05/2020 Echo: EF 55-60%, no rwma, Gr1 DD, mildly red RV fxn; b. 09/2020 RHC: PCWP 36mHg. CO/CI 4.7/2.1.   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 09/2020 Cath: LM 70d, LAD 70ost, 80/752mLCX 100ost/p, 99p/m, RCA 100ost/p, VG->OM2 4052mIMA->LAD nl, VG->RPDA nl-->Med Rx.   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   right scalp/neck postauricular - [facility]    PAST SURGICAL HISTORY:  Past Surgical History:  Procedure Laterality Date   APPENDECTOMY     BRONCHIAL WASHINGS  05/18/2020   Procedure: BRONCHIAL WASHINGS;  Surgeon: EllMargaretha SeedsD;  [facility];;   CARDIAC CATHETERIZATION     PT DENIES   COLONOSCOPY     2011,2015,2021   COLONOSCOPY WITH PROPOFOL N/A 07/10/2020   Procedure: COLONOSCOPY WITH PROPOFOL;  Surgeon: Toledo, TeoBenay PikeD;  Location: [facility] ENDOSCOPY;  Service: Gastroenterology;  Laterality: N/A;   CORONARY ARTERY BYPASS GRAFT     FLEXIBLE BRONCHOSCOPY N/A 04/24/2020   Procedure: FLEXIBLE BRONCHOSCOPY;  Surgeon: AleOttie GlazierD;  Location: [facility] ORS;  Service: Thoracic;  Laterality: N/A;   IR SINUS/FIST TUBE CHK-NON GI  10/27/2019   open heart surgery     PORTACATH PLACEMENT Right 10/13/2019   Procedure: INSERTION PORT-A-CATH;  Surgeon: CinHerbert PunD;  Location: [facility] ORS;  Service: General;  Laterality: Right;   RIGHT/LEFT HEART CATH AND CORONARY ANGIOGRAPHY N/A 10/03/2020   Procedure: RIGHT/LEFT HEART CATH AND CORONARY ANGIOGRAPHY;  Surgeon: EndNelva BushD;  [place] [facility];  Service: Cardiovascular;  Laterality: N/A;   ROBOT ASSISTED LAPAROSCOPIC PARTIAL COLECTOMY  09/2019   VIDEO BRONCHOSCOPY N/A 05/18/2020   Procedure: VIDEO BRONCHOSCOPY WITHOUT FLUORO;  Surgeon: EllMargaretha SeedsD;  [facility] [place] [place];  Laterality: N/A;  HEMATOLOGY/ONCOLOGY HISTORY:  Oncology History  Rectal cancer ([place])  09/20/2019 Cancer Staging   Staging form: Colon and Rectum, AJCC 8th Edition - Pathologic stage from 09/20/2019: Stage IIIB (pT3, pN1a, cM0) - Signed by Earlie Server, MD on 06/16/2020 Total positive nodes: 1 Histologic grading system: 4 grade system Histologic grade (G): G2    09/28/2019 Initial Diagnosis   Rectal cancer ([place])   11/02/2019 - 02/04/2020 Chemotherapy    The patient had palonosetron (ALOXI) injection 0.25 mg, 0.25 mg, Intravenous,  Once, 7 of 7 cycles Administration: 0.25 mg (11/02/2019), 0.25 mg (11/17/2019), 0.25 mg (12/01/2019), 0.25 mg (12/15/2019), 0.25 mg (01/05/2020), 0.25 mg (01/19/2020), 0.25 mg (02/02/2020) pegfilgrastim (NEULASTA ONPRO KIT) injection 6 mg, 6 mg, Subcutaneous, Once, 3 of 3 cycles Administration: 6 mg (12/17/2019), 6 mg (01/07/2020), 6 mg (01/21/2020) leucovorin 900 mg in dextrose 5 % 250 mL infusion, 892 mg, Intravenous,  Once, 7 of 7 cycles Administration: 900 mg (11/02/2019), 900 mg (11/17/2019), 900 mg (12/01/2019), 900 mg (12/15/2019), 900 mg (01/05/2020), 900 mg (01/19/2020), 900 mg (02/02/2020) oxaliplatin (ELOXATIN) 185 mg in dextrose 5 % 500 mL chemo infusion, 83 mg/m2 = 190 mg, Intravenous,  Once, 7 of 7 cycles Dose modification: 75 mg/m2 (original dose 85 mg/m2, Cycle 5, Reason: Dose not tolerated) Administration: 185 mg (11/02/2019), 185 mg (11/17/2019), 185 mg (12/01/2019), 185 mg (12/15/2019), 165 mg (01/05/2020), 165 mg (01/19/2020), 165 mg (02/02/2020) fluorouracil (ADRUCIL) chemo injection 900 mg, 400 mg/m2 = 900 mg, Intravenous,  Once, 2 of 2 cycles Administration: 900 mg (11/02/2019), 900 mg (11/17/2019) fluorouracil (ADRUCIL) 5,350 mg in sodium chloride 0.9 % 143 mL chemo infusion, 2,400 mg/m2 = 5,350 mg, Intravenous, 1 Day/Dose, 7 of 7 cycles Administration: 5,350 mg (11/02/2019), 5,350 mg (11/17/2019), 5,350 mg (12/01/2019), 5,350 mg (12/15/2019), 5,350 mg (01/05/2020), 5,350 mg (01/19/2020), 5,350 mg (02/02/2020)   for chemotherapy treatment.     03/27/2020 -  Chemotherapy   The patient had capecitabine (XELODA) 500 MG tablet, 1,500 mg (100 % of original dose 1,500 mg), Oral, 2 times daily after meals, 0 of 1 cycle, Start date: 03/21/2020, End date: -- Dose modification: 1,500 mg (original dose 1,500 mg, Cycle 1)   for chemotherapy treatment.       ALLERGIES:  is allergic to ofev [nintedanib].  MEDICATIONS:  Current Outpatient  Medications  Medication Sig Dispense Refill   aspirin EC 81 MG tablet Take 1 tablet (81 mg total) by mouth daily. Swallow whole.     atorvastatin (LIPITOR) 20 MG tablet Take 20 mg by mouth daily.     furosemide (LASIX) 40 MG tablet Take 1 tablet (40 mg total) by mouth 2 (two) times daily for 7 days. 30 tablet 5   metoprolol tartrate (LOPRESSOR) 25 MG tablet Take 25 mg by mouth 2 (two) times daily.     Pirfenidone (ESBRIET) 267 MG TABS Take 2 tablets (534 mg total) by mouth in the morning, at noon, and at bedtime. 540 tablet 3   predniSONE (DELTASONE) 10 MG tablet Take 4 tablets (40 mg total) by mouth daily with breakfast for 3 days, THEN 3 tablets (30 mg total) daily with breakfast for 3 days, THEN 2 tablets (20 mg total) daily with breakfast for 3 days, THEN 1 tablet (10 mg total) daily with breakfast for 3 days. Then continue on 47m. 30 tablet 0   predniSONE (DELTASONE) 5 MG tablet Take 1 tablet (5 mg total) by mouth daily with breakfast. 30 tablet 2   temazepam (RESTORIL) 30 MG capsule Take 30 mg  by mouth at bedtime.      Treprostinil (TYVASO) 0.6 MG/ML SOLN Inhale into the lungs 4 (four) times daily.     No current facility-administered medications for this visit.    VITAL SIGNS: There were no vitals taken for this visit. There were no vitals filed for this visit.  Estimated body mass index is 32.16 kg/m as calculated from the following:   Height as of 06/28/21: _0  (1.753 m).   Weight as of an earlier encounter on 07/10/21: 217 lb 12.8 oz (98.8 kg).  LABS: CBC:    Component Value Date/Time   WBC 16.1 (H) 07/03/2021 1308   HGB 14.1 07/03/2021 1308   HGB 14.2 09/27/2020 1259   HCT 42.0 07/03/2021 1308   HCT 41.6 09/27/2020 1259   PLT 165 07/03/2021 1308   PLT 171 09/27/2020 1259   MCV 98.6 07/03/2021 1308   MCV 102 (H) 09/27/2020 1259   MCV 99 09/20/2011 1104   NEUTROABS 14.3 (H) 07/03/2021 1308   NEUTROABS 2.6 09/20/2011 1104   LYMPHSABS 1.0 07/03/2021 1308   LYMPHSABS 1.9  09/20/2011 1104   MONOABS 0.6 07/03/2021 1308   MONOABS 0.4 09/20/2011 1104   EOSABS 0.0 07/03/2021 1308   EOSABS 0.2 09/20/2011 1104   BASOSABS 0.0 07/03/2021 1308   BASOSABS 0.1 09/20/2011 1104   Comprehensive Metabolic Panel:    Component Value Date/Time   NA 135 07/03/2021 1308   NA 143 10/13/2020 1043   NA 142 09/20/2011 1104   K 4.1 07/03/2021 1308   K 3.8 09/20/2011 1104   CL 93 (L) 07/03/2021 1308   CL 107 09/20/2011 1104   CO2 35 (H) 07/03/2021 1308   CO2 28 09/20/2011 1104   BUN 17 07/03/2021 1308   BUN 8 10/13/2020 1043   BUN 12 09/20/2011 1104   CREATININE 1.12 07/03/2021 1308   CREATININE 0.99 09/20/2011 1104   GLUCOSE 418 (H) 07/03/2021 1308   GLUCOSE 119 (H) 09/20/2011 1104   CALCIUM 8.7 (L) 07/03/2021 1308   CALCIUM 8.8 09/20/2011 1104   AST 118 (H) 07/03/2021 1308   ALT 190 (H) 07/03/2021 1308   ALKPHOS 187 (H) 07/03/2021 1308   BILITOT 0.5 07/03/2021 1308   BILITOT 0.6 08/15/2020 0858   PROT 7.1 07/03/2021 1308   PROT 6.4 08/15/2020 0858   ALBUMIN 3.7 07/03/2021 1308   ALBUMIN 4.0 08/15/2020 0858    RADIOGRAPHIC STUDIES: CT Angio Chest Pulmonary Embolism (PE) W or WO Contrast  Result Date: 06/20/2021 CLINICAL DATA:  Shortness of breath and left chest soreness. Elevated D-dimer. EXAM: CT ANGIOGRAPHY CHEST WITH CONTRAST TECHNIQUE: Multidetector CT imaging of the chest was performed using the standard protocol during bolus administration of intravenous contrast. Multiplanar CT image reconstructions and MIPs were obtained to evaluate the vascular anatomy. RADIATION DOSE REDUCTION: This exam was performed according to the departmental dose-optimization program which includes automated exposure control, adjustment of the mA and/or kV according to patient size and/or use of iterative reconstruction technique. CONTRAST:  85m OMNIPAQUE IOHEXOL 350 MG/ML SOLN COMPARISON:  Portable chest obtained earlier today. Chest CT dated 03/23/2021. FINDINGS: Cardiovascular:  Stable enlarged heart. Stable post CABG changes. Atheromatous calcifications, including the coronary arteries and aorta. Normally opacified pulmonary arteries with no pulmonary arterial filling defects seen. Mediastinum/Nodes: No enlarged mediastinal, hilar, or axillary lymph nodes. Thyroid gland, trachea, and esophagus demonstrate no significant findings. Lungs/Pleura: No significant change in previously demonstrated severe chronic interstitial lung disease and bronchiectasis. Mild posterior right upper lobe paraseptal emphysematous changes. No superimposed  acute process. No pleural fluid. Upper Abdomen: Unremarkable. Musculoskeletal: Thoracic and lower cervical spine degenerative changes. Review of the MIP images confirms the above findings. IMPRESSION: 1. No pulmonary emboli or other acute abnormality. 2. Stable cardiomegaly and post CABG changes. 3. Stable severe chronic interstitial lung disease, bronchiectasis and paraseptal emphysema. 4. Dense calcific coronary artery and aortic atherosclerosis. Aortic Atherosclerosis (ICD10-I70.0) and Emphysema (ICD10-J43.9). Electronically Signed   By: Claudie Revering M.D.   On: 06/20/2021 10:53   CT Abdomen Pelvis W Contrast  Result Date: 07/08/2021 CLINICAL DATA:  Follow-up rectal carcinoma.  Abnormal blood work. EXAM: CT ABDOMEN AND PELVIS WITH CONTRAST TECHNIQUE: Multidetector CT imaging of the abdomen and pelvis was performed using the standard protocol following bolus administration of intravenous contrast. RADIATION DOSE REDUCTION: This exam was performed according to the departmental dose-optimization program which includes automated exposure control, adjustment of the mA and/or kV according to patient size and/or use of iterative reconstruction technique. CONTRAST:  121m OMNIPAQUE IOHEXOL 300 MG/ML  SOLN COMPARISON:  06/30/2020 FINDINGS: Lower Chest: Chronic bibasilar interstitial lung disease and associated bronchiectasis. No acute findings. Hepatobiliary:  Multiple hypovascular masses are seen in the right and left hepatic lobes which are new since previous study and consistent with liver metastases. Index lesion in the posterior right hepatic lobe measures 3.8 x 3.2 cm on image 32/2. Prior cholecystectomy. No evidence of biliary obstruction. Pancreas:  No mass or inflammatory changes. Spleen: Within normal limits in size and appearance. Adrenals/Urinary Tract: Stable 9 cm benign left renal cysts. No masses identified. No evidence of ureteral calculi or hydronephrosis. Stomach/Bowel: Surgical anastomosis again seen at the rectosigmoid junction. No mass identified. Diffuse colonic diverticulosis is again demonstrated. Normal appendix visualized. No evidence of obstruction, inflammatory process or abnormal fluid collections. Vascular/Lymphatic: No pathologically enlarged lymph nodes. No acute vascular findings. Aortic atherosclerotic calcification noted. Reproductive:  No mass or other significant abnormality. Other:  None. Musculoskeletal:  No suspicious bone lesions identified. IMPRESSION: New hepatic metastatic disease since prior exam. No other sites of metastatic disease identified within the abdomen or pelvis. Colonic diverticulosis. No radiographic evidence of diverticulitis. Chronic bibasilar interstitial lung disease and bronchiectasis. Aortic Atherosclerosis (ICD10-I70.0). These results will be called to the ordering clinician or representative by the Radiologist Assistant, and communication documented in the PACS or [employer] Electronically Signed   By: JMarlaine HindM.D.   On: 07/08/2021 18:57   DG Chest Port 1 View  Result Date: 06/19/2021 CLINICAL DATA:  Shortness of breath, hypoxia EXAM: PORTABLE CHEST 1 VIEW COMPARISON:  Previous studies including the radiographs done on 05/16/2020 and CT done on 03/22/2021 FINDINGS: Transverse diameter of heart is increased. Increased interstitial markings are seen in the mid and lower lung fields.  Interstitial fibrosis was noted in the CT chest done on 03/22/2021. Upper lung fields are unremarkable. There is poor inspiration. There is no significant pleural effusion or pneumothorax. Tip of central venous catheter is seen in the superior vena cava. 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 IMPRESSION: Increased interstitial markings are seen in the mid and lower lung fields suggesting pulmonary fibrosis. Possibility of superimposed interstitial pneumonia is not excluded. Electronically Signed   By: PElmer PickerM.D.   On: 06/19/2021 11:55    PERFORMANCE STATUS (ECOG) : 3 - Symptomatic, >50% confined to bed  Review of Systems Unless otherwise noted, a complete review of systems is negative.  Physical Exam General: NAD Pulmonary: Unlabored, on O2 Extremities: no edema, no joint deformities Skin: no rashes Neurological: Weakness but  otherwise nonfocal  IMPRESSION: I met with patient following his visit with Dr. Tasia Catchings CT of abdomen/pelvis on 07/02/2021 revealed new hepatic metastatic disease, which likely reflects recurrent/now stage IV rectal cancer.  Patient is tenuous at baseline with recurrent hospitalizations/respiratory failure on 6 to 8 L O2 at baseline.  Patient is not felt to be a viable treatment candidate for cancer.  Patient verbalized understanding that he is frail at baseline and that chemotherapy might negatively impact his quality of life.  Patient stated that he would prefer to forego chemotherapy and just focus on comfort/quality of life at home.  He understands that his prognosis is likely measured in months.  We discussed hospice in detail and patient stated that he would like to pursue hospice services.  Of note, patient is currently followed by [facility] palliative care.  Patient states that he has a DNR order at home.  He would not want to be resuscitated or have his life prolonged artificially machines.  He states that he is ready to die when it is his  time.  PLAN: -Best supportive care -Referral to hospice -DNR -RTC as needed  Case and plan discussed with Dr. Tasia Catchings   Patient expressed understanding and was in agreement with this plan. He also understands that He can call the clinic at any time with any questions, concerns, or complaints.     Time Total: 20 minutes  Visit consisted of counseling and education dealing with the complex and emotionally intense issues of symptom management and palliative care in the setting of serious and potentially life-threatening illness.Greater than 50%  of this time was spent counseling and coordinating care related to the above assessment and plan.  Signed by: Altha Harm, PhD, NP-C

## 2021-07-10 NOTE — Progress Notes (Signed)
.  Hematology/Oncology Progress note [phone] [phone]      Patient Care Team: Albina Billet, MD as PCP - General (Internal Medicine) End, Harrell Gave, MD as PCP - Cardiology (Cardiology) Clent Jacks, RN as Oncology Nurse Navigator Earlie Server, MD as Consulting Physician (Oncology)  REFERRING PROVIDER: Albina Billet, MD  CHIEF COMPLAINTS/REASON FOR VISIT:  Follow-up for colorectal cancer  HISTORY OF PRESENTING ILLNESS:   Garrett Peterson is a  76 y.o.  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 Alice Reichert for follow-up on history of colon polyps.  Patient also has reported recent history of change of bowel habits.  He has felt more constipated. No blood in the stool, abdominal pain, unintentional weight loss or fever or chills. Colonoscopy records is at [facility] clinic and is not available to me. 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  MMR is deferred to resection specimen.  # 09/20/2019, patient underwent robotic assisted laparoscopic low anterior resection. Pathology report was reviewed by me and discussed with patient and his wife. I also called pathology and discussed with Dr. Reuel Derby. The tumor is located 1.5 cm distal to the rectosigmoid junction.  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  I explained to patient the different final diagnosis of cancer of rectum rather than endoscopic impression of distal sigmoid colon cancer.  Tumor was not visualized on preop CT scan.  -pT3 pN1 M0, grade 2 adenocarcinoma. MMR status is intact  All margins were uninvolved by invasive carcinoma, high-grade dysplasia/intramucosal adenocarcinoma and low-grade dysplastic.  1 out of 8 regional lymph nodes were positive for malignancy. Patient  has lymphovascular invasion as well as perineural invasion. I discussed with patient that ideally his rectal cancer would be better treated with total neoadjuvant treatment prior to the surgery if we would know his final diagnosis will be an upper rectal cancer.  Preoperative treatments is associated with a more favorable long-term toxicity profile and fever local recurrence then postoperative therapy.  However overall survival appears to be similar.In this case, I would recommend chemotherapy and adjuvant chemo- radiation  # 10/13/2019 patient had CT abdomen pelvis with contrast for evaluation of concern of peritonitis and bowel perforation.  Pneumoperitoneum was seen on earlier radiograph.  There was moderate pneumoperitoneum, extensive colonic diverticulosis.  5.2 x 4.1 cm loculated fluid collection in the left lower abdomen concerning for an infected collection or abscess. Patient had a drain placed and finished a course of antibiotics.  Draining catheter was pulled.  #11/02/2019, patient started adjuvant FOLFOX treatments. Finished 7 cycles of adjuvant FOLFOX.  Cycle 8 treatment was held as patient was admitted due to pneumonia, respiratory failure due to his recent Covid 19 infection.  Due to refractory respiratory symptoms, he did not finish planned chemotherapy.  He proceeded with radiation treatments.  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  Discussed with the patient and encourage him to further discuss with primary care provider for the need of see pulmonology  Patient declined additional appointment of oncology follow-up and a CT scan in June 2022.  Patient presents to reestablish care.  Patient's health continues to deteriorate due to interstitial lung disease, chronic respiratory failure, currently on 8 L of oxygen via nasal cannula, preserved EF heart failure, frequent hospitalization. Reports feeling  shortness of breath with minimal exertion.   He follows up with pulmonology Dr. Patrecia Pour.  Patient is on tapering course of steroids.  INTERVAL HISTORY Garrett Peterson is a 76 y.o. male who has above history reviewed by me today presents for follow up visit for management of rectal cancer. During the interval, patient had CT abdomen pelvis for evaluation of cancer recurrence.  He presents to review results.  He was accompanied by his grandson.  Denies abdominal pain.  Review of Systems  Constitutional:  Positive for fatigue. Negative for appetite change, chills, fever and unexpected weight change.  HENT:   Negative for hearing loss and voice change.   Eyes:  Negative for eye problems and icterus.  Respiratory:  Positive for shortness of breath. Negative for chest tightness and cough.   Cardiovascular:  Negative for chest pain and leg swelling.  Gastrointestinal:  Negative for abdominal distention and abdominal pain.  Endocrine: Negative for hot flashes.  Genitourinary:  Negative for difficulty urinating, dysuria and frequency.   Musculoskeletal:  Negative for arthralgias.  Skin:  Negative for itching and rash.  Neurological:  Negative for light-headedness and numbness.  Hematological:  Negative for adenopathy. Does not bruise/bleed easily.  Psychiatric/Behavioral:  Negative for confusion.    MEDICAL HISTORY:  Past Medical History:  Diagnosis Date   (HFpEF) heart failure with preserved ejection fraction ([place])    a. 05/2020 Echo: EF 55-60%, no rwma, Gr1 DD, mildly red RV fxn; b. 09/2020 RHC: PCWP 57mHg. CO/CI 4.7/2.1.   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 09/2020 Cath: LM 70d, LAD 70ost, 80/766mLCX 100ost/p, 99p/m, RCA 100ost/p, VG->OM2 4053mIMA->LAD nl, VG->RPDA nl-->Med Rx.   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   right scalp/neck postauricular - [facility]    SURGICAL HISTORY: Past Surgical History:  Procedure Laterality Date   APPENDECTOMY     BRONCHIAL WASHINGS  05/18/2020   Procedure: BRONCHIAL WASHINGS;  Surgeon: EllMargaretha SeedsD;  [facility] [place] [place];;   CARDIAC CATHETERIZATION     PT DENIES   COLONOSCOPY     2011,2015,2021   COLONOSCOPY WITH PROPOFOL N/A 07/10/2020   Procedure: COLONOSCOPY WITH PROPOFOL;  Surgeon: Toledo, TeoBenay PikeD;  Location: [facility];  Service: Gastroenterology;  Laterality: N/A;   CORONARY ARTERY BYPASS GRAFT     FLEXIBLE BRONCHOSCOPY N/A 04/24/2020   Procedure: FLEXIBLE BRONCHOSCOPY;  Surgeon: AleOttie GlazierD;  Location: [facility];  Service: Thoracic;  Laterality: N/A;   IR SINUS/FIST TUBE CHK-NON GI  10/27/2019   open heart surgery     PORTACATH PLACEMENT Right 10/13/2019   Procedure: INSERTION PORT-A-CATH;  Surgeon: CinHerbert PunD;  Location: [facility];  Service: General;  Laterality: Right;   RIGHT/LEFT HEART CATH AND CORONARY ANGIOGRAPHY N/A 10/03/2020   Procedure: RIGHT/LEFT HEART CATH AND CORONARY ANGIOGRAPHY;  Surgeon: EndNelva BushD;  [facility];  Service: Cardiovascular;  Laterality: N/A;   ROBOT ASSISTED LAPAROSCOPIC PARTIAL COLECTOMY  09/2019   VIDEO BRONCHOSCOPY N/A 05/18/2020   Procedure: VIDEO BRONCHOSCOPY WITHOUT FLUORO;  Surgeon: Margaretha Seeds, MD;  Location: [place];  Service: Pulmonary;  Laterality: N/A;    SOCIAL HISTORY: Social History   Socioeconomic History   Marital status: Married    Spouse name: Not on file   Number of children: Not on file   Years of education: Not on file   Highest education level: Not on file  Occupational History   Not on file  Tobacco Use   Smoking status: Never   Smokeless tobacco: Former    Types: Chew  Vaping Use    Vaping Use: Never used  Substance and Sexual Activity   Alcohol use: Yes    Alcohol/week: 0.0 standard drinks    Comment: OCCAS-not within the last year as of 09/27/2020   Drug use: Never   Sexual activity: Not Currently  Other Topics Concern   Not on file  Social History Narrative   Not on file   Social Determinants of Health   Financial Resource Strain: Not on file  Food Insecurity: Not on file  Transportation Needs: Not on file  Physical Activity: Not on file  Stress: Not on file  Social Connections: Not on file  Intimate Partner Violence: Not on file    FAMILY HISTORY: Family History  Problem Relation Age of Onset   Lung disease Father     ALLERGIES:  is allergic to ofev [nintedanib].  MEDICATIONS:  Current Outpatient Medications  Medication Sig Dispense Refill   aspirin EC 81 MG tablet Take 1 tablet (81 mg total) by mouth daily. Swallow whole.     atorvastatin (LIPITOR) 20 MG tablet Take 20 mg by mouth daily.     metoprolol tartrate (LOPRESSOR) 25 MG tablet Take 25 mg by mouth 2 (two) times daily.     Pirfenidone (ESBRIET) 267 MG TABS Take 2 tablets (534 mg total) by mouth in the morning, at noon, and at bedtime. 540 tablet 3   predniSONE (DELTASONE) 10 MG tablet Take 4 tablets (40 mg total) by mouth daily with breakfast for 3 days, THEN 3 tablets (30 mg total) daily with breakfast for 3 days, THEN 2 tablets (20 mg total) daily with breakfast for 3 days, THEN 1 tablet (10 mg total) daily with breakfast for 3 days. Then continue on 13m. 30 tablet 0   predniSONE (DELTASONE) 5 MG tablet Take 1 tablet (5 mg total) by mouth daily with breakfast. 30 tablet 2   temazepam (RESTORIL) 30 MG capsule Take 30 mg by mouth at bedtime.      Treprostinil (TYVASO) 0.6 MG/ML SOLN Inhale into the lungs 4 (four) times daily.     furosemide (LASIX) 40 MG tablet Take 1 tablet (40 mg total) by mouth 2 (two) times daily for 7 days. 30 tablet 5   No current facility-administered medications for  this visit.     PHYSICAL EXAMINATION: ECOG PERFORMANCE STATUS: 2 - Symptomatic, <50% confined to bed Vitals:   07/10/21 1355  BP: (!) 145/70  Pulse: 79  Temp: (!) 97.5 F (36.4 C)   Filed Weights   07/10/21 1355  Weight: 217 lb 12.8 oz (98.8 kg)    Physical Exam Constitutional:      General: He is not in acute distress. HENT:     Head: Normocephalic and atraumatic.  Eyes:     General: No scleral icterus. Cardiovascular:     Rate and Rhythm: Normal rate and regular rhythm.     Heart sounds: Normal heart sounds.  Pulmonary:     Effort: Pulmonary effort  is normal. No respiratory distress.     Breath sounds: No wheezing.     Comments: Bibasilar crackles.  Mild wheezing Abdominal:     General: Bowel sounds are normal. There is no distension.     Palpations: Abdomen is soft.  Musculoskeletal:        General: No deformity. Normal range of motion.     Cervical back: Normal range of motion and neck supple.  Skin:    General: Skin is warm and dry.     Findings: No erythema or rash.  Neurological:     Mental Status: He is alert and oriented to person, place, and time. Mental status is at baseline.     Cranial Nerves: No cranial nerve deficit.     Coordination: Coordination normal.  Psychiatric:        Mood and Affect: Mood normal.    LABORATORY DATA:  I have reviewed the data as listed Lab Results  Component Value Date   WBC 16.1 (H) 07/03/2021   HGB 14.1 07/03/2021   HCT 42.0 07/03/2021   MCV 98.6 07/03/2021   PLT 165 07/03/2021   Recent Labs    09/21/20 1005 09/27/20 1259 02/02/21 1053 02/02/21 1053 03/21/21 1130 06/19/21 1156 06/19/21 1703 06/20/21 0543 06/21/21 0819 07/03/21 1308  NA  --    < >  --    < > 141 139  --  139 139 135  K  --    < >  --   --  3.9 3.5  --  3.9 3.8 4.1  CL  --    < >  --   --  98 97*  --  92* 95* 93*  CO2  --    < >  --   --  36* 35*  --  31 33* 35*  GLUCOSE  --    < >  --    < > 136* 141*  --  171* 231* 418*  BUN  --    <  >  --    < > 10 10  --  _0 CREATININE  --    < >  --   --  1.08 0.77   < > 0.75 0.94 1.12  CALCIUM  --    < >  --   --  9.1 8.7*  --  9.8 9.1 8.7*  GFRNONAA  --   --   --   --   --  >60   < > >60 >60 >60  PROT 6.5  --  7.3  --  7.4 7.5  --  7.3  --  7.1  ALBUMIN 3.8  --  4.0  --  4.1 3.7  --  3.6  --  3.7  AST 34  --  24  --  27 26  --  24  --  118*  ALT 39  --  31  --  27 25  --  25  --  190*  ALKPHOS 97  --  122*  --  116 126  --  126  --  187*  BILITOT 0.5  --  0.4  --  0.5 0.5  --  0.4  --  0.5  BILIDIR 0.1  --  0.1  --  0.0  --   --   --   --   --    < > = values in this interval not displayed.    Iron/TIBC/Ferritin/ %Sat    Component  Value Date/Time   IRON 100 09/06/2019 1200   TIBC 368 09/06/2019 1200   FERRITIN 198 09/06/2019 1200   IRONPCTSAT 27 09/06/2019 1200      RADIOGRAPHIC STUDIES: I have personally reviewed the radiological images as listed and agreed with the findings in the report. CT Angio Chest Pulmonary Embolism (PE) W or WO Contrast  Result Date: 06/20/2021 CLINICAL DATA:  Shortness of breath and left chest soreness. Elevated D-dimer. EXAM: CT ANGIOGRAPHY CHEST WITH CONTRAST TECHNIQUE: Multidetector CT imaging of the chest was performed using the standard protocol during bolus administration of intravenous contrast. Multiplanar CT image reconstructions and MIPs were obtained to evaluate the vascular anatomy. RADIATION DOSE REDUCTION: This exam was performed according to the departmental dose-optimization program which includes automated exposure control, adjustment of the mA and/or kV according to patient size and/or use of iterative reconstruction technique. CONTRAST:  33m OMNIPAQUE IOHEXOL 350 MG/ML SOLN COMPARISON:  Portable chest obtained earlier today. Chest CT dated 03/23/2021. FINDINGS: Cardiovascular: Stable enlarged heart. Stable post CABG changes. Atheromatous calcifications, including the coronary arteries and aorta. Normally opacified pulmonary  arteries with no pulmonary arterial filling defects seen. Mediastinum/Nodes: No enlarged mediastinal, hilar, or axillary lymph nodes. Thyroid gland, trachea, and esophagus demonstrate no significant findings. Lungs/Pleura: No significant change in previously demonstrated severe chronic interstitial lung disease and bronchiectasis. Mild posterior right upper lobe paraseptal emphysematous changes. No superimposed acute process. No pleural fluid. Upper Abdomen: Unremarkable. Musculoskeletal: Thoracic and lower cervical spine degenerative changes. Review of the MIP images confirms the above findings. IMPRESSION: 1. No pulmonary emboli or other acute abnormality. 2. Stable cardiomegaly and post CABG changes. 3. Stable severe chronic interstitial lung disease, bronchiectasis and paraseptal emphysema. 4. Dense calcific coronary artery and aortic atherosclerosis. Aortic Atherosclerosis (ICD10-I70.0) and Emphysema (ICD10-J43.9). Electronically Signed   By: SClaudie ReveringM.D.   On: 06/20/2021 10:53   CT Abdomen Pelvis W Contrast  Result Date: 07/08/2021 CLINICAL DATA:  Follow-up rectal carcinoma.  Abnormal blood work. EXAM: CT ABDOMEN AND PELVIS WITH CONTRAST TECHNIQUE: Multidetector CT imaging of the abdomen and pelvis was performed using the standard protocol following bolus administration of intravenous contrast. RADIATION DOSE REDUCTION: This exam was performed according to the departmental dose-optimization program which includes automated exposure control, adjustment of the mA and/or kV according to patient size and/or use of iterative reconstruction technique. CONTRAST:  10105mOMNIPAQUE IOHEXOL 300 MG/ML  SOLN COMPARISON:  06/30/2020 FINDINGS: Lower Chest: Chronic bibasilar interstitial lung disease and associated bronchiectasis. No acute findings. Hepatobiliary: Multiple hypovascular masses are seen in the right and left hepatic lobes which are new since previous study and consistent with liver metastases. Index  lesion in the posterior right hepatic lobe measures 3.8 x 3.2 cm on image 32/2. Prior cholecystectomy. No evidence of biliary obstruction. Pancreas:  No mass or inflammatory changes. Spleen: Within normal limits in size and appearance. Adrenals/Urinary Tract: Stable 9 cm benign left renal cysts. No masses identified. No evidence of ureteral calculi or hydronephrosis. Stomach/Bowel: Surgical anastomosis again seen at the rectosigmoid junction. No mass identified. Diffuse colonic diverticulosis is again demonstrated. Normal appendix visualized. No evidence of obstruction, inflammatory process or abnormal fluid collections. Vascular/Lymphatic: No pathologically enlarged lymph nodes. No acute vascular findings. Aortic atherosclerotic calcification noted. Reproductive:  No mass or other significant abnormality. Other:  None. Musculoskeletal:  No suspicious bone lesions identified. IMPRESSION: New hepatic metastatic disease since prior exam. No other sites of metastatic disease identified within the abdomen or pelvis. Colonic diverticulosis. No radiographic evidence of diverticulitis. Chronic  bibasilar interstitial lung disease and bronchiectasis. Aortic Atherosclerosis (ICD10-I70.0). These results will be called to the ordering clinician or representative by the Radiologist Assistant, and communication documented in the PACS or [employer]. Electronically Signed   By: Marlaine Hind M.D.   On: 07/08/2021 18:57   DG Chest Port 1 View  Result Date: 06/19/2021 CLINICAL DATA:  Shortness of breath, hypoxia EXAM: PORTABLE CHEST 1 VIEW COMPARISON:  Previous studies including the radiographs done on 05/16/2020 and CT done on 03/22/2021 FINDINGS: Transverse diameter of heart is increased. Increased interstitial markings are seen in the mid and lower lung fields. Interstitial fibrosis was noted in the CT chest done on 03/22/2021. Upper lung fields are unremarkable. There is poor inspiration. There is no significant pleural  effusion or pneumothorax. Tip of central venous catheter is seen in the superior vena cava. 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 IMPRESSION: Increased interstitial markings are seen in the mid and lower lung fields suggesting pulmonary fibrosis. Possibility of superimposed interstitial pneumonia is not excluded. Electronically Signed   By: Elmer Picker M.D.   On: 06/19/2021 11:55       ASSESSMENT & PLAN:  1. Rectal cancer ([place])   2. Goals of care, counseling/discussion    #Rectal cancer stage III CT abdomen pelvis was reviewed and discussed with patient.  Reviewed images with him. Unfortunately CT showed new multiple liver lesions, consistent with liver metastasis. Ideally if he would want to have standard therapy, I would recommend a liver biopsy. Patient has poor performance status secondary to chronic respiratory failure/interstitial lung disease on 6 to 8 L of oxygen. I am concerned that he is not a good candidate for aggressive chemotherapy treatments. Prognosis is poor, he understand that this is not curable condition.  Natural course of the disease was discussed with patient, life expectancy is about 3 to 6 months.  I think hospice/comfort care is a reasonable choice for him. Patient will have a discussion with palliative care service today.   Earlie Server, MD, PhD Hematology Oncology  07/10/2021

## 2021-07-11 ENCOUNTER — Encounter: Payer: Self-pay | Admitting: Oncology

## 2021-07-11 NOTE — Telephone Encounter (Signed)
Patient confirmed he is using 12 puffs four times daily of Tyvaso inhalation device -> equivalent to 39mg four times daily of DPI device ? ?[facility]  via CoverMyMeds. Will update once we receive a response. ? ?Key: [id number] ?PA Case ID: [id number]? ?Tyvaso referral form submission to [facility] pending Tyvaso DPI PA approval. ? ?DKnox Saliva PharmD, MPH, BCPS ?Clinical Pharmacist (Rheumatology and Pulmonology) ?

## 2021-07-12 NOTE — Telephone Encounter (Signed)
Received notification from [facility] regarding a prior authorization for TYVASO DPI. Authorization has been APPROVED from 07/11/21 to 05/12/22.  ?Authorization # [id number] ?[employer] Phone # [phone] ? ?Faxed Tyvaso DPI referral to [facility] for patient to transition from inhalation device to DPI. Attached chart notes and prior authorization approval letter ? ?Fax: [phone] ?Phone: [phone] ? ?[name], PharmD, MPH, BCPS ?Clinical Pharmacist (Rheumatology and Pulmonology) ?

## 2021-07-27 ENCOUNTER — Telehealth: Payer: Self-pay | Admitting: Internal Medicine

## 2021-07-27 NOTE — Telephone Encounter (Signed)
Home nurse calling states Patient taking Lasix and asking if Potassium also needs to be ordered to take. ? ?

## 2021-07-27 NOTE — Telephone Encounter (Signed)
Tyvaso DPI shipped to patient on 07/27/21. Dose = 56mg inhaled 4 times daily ? ?DKnox Saliva PharmD, MPH, BCPS ?Clinical Pharmacist (Rheumatology and Pulmonology) ?

## 2021-07-27 NOTE — Addendum Note (Signed)
Addended by: Cassandria Anger on: 07/27/2021 03:11 PM ? ? Modules accepted: Orders ? ?

## 2021-07-27 NOTE — Telephone Encounter (Signed)
Called Amy back with Hospice. Notified we last saw pt October 2022 and at that time pt was to have lab work with PCP the following week. Pt also followed by oncology.  ?Reviewing pt's chart last lab work that I see is from 07/03/21 with potassium 4.1.  ?Notified Amy to check with PCP to verify if any more recent labs have been done as I cannot see these in EMR. ? ?Amy stated prior to my calling back, she verified PCP has not drawn any labs since 07/03/21.  ?She also just received orders from oncology to draw BMET next week with home health visit.  ?Need for potassium will be addressed after these lab results return per Amy.  ? ?Amy appreciative and has no further needs at this time.  ?

## 2021-07-31 ENCOUNTER — Encounter: Payer: Self-pay | Admitting: Oncology

## 2021-08-09 ENCOUNTER — Ambulatory Visit: Payer: Medicare Other | Admitting: Internal Medicine

## 2021-08-15 ENCOUNTER — Telehealth: Payer: Self-pay | Admitting: Internal Medicine

## 2021-08-15 ENCOUNTER — Telehealth (INDEPENDENT_AMBULATORY_CARE_PROVIDER_SITE_OTHER): Admitting: Internal Medicine

## 2021-08-15 ENCOUNTER — Encounter: Payer: Self-pay | Admitting: Internal Medicine

## 2021-08-15 DIAGNOSIS — J9611 Chronic respiratory failure with hypoxia: Secondary | ICD-10-CM | POA: Diagnosis not present

## 2021-08-15 DIAGNOSIS — J84112 Idiopathic pulmonary fibrosis: Secondary | ICD-10-CM

## 2021-08-15 DIAGNOSIS — Z66 Do not resuscitate: Secondary | ICD-10-CM

## 2021-08-15 DIAGNOSIS — R0609 Other forms of dyspnea: Secondary | ICD-10-CM | POA: Diagnosis not present

## 2021-08-15 DIAGNOSIS — I2723 Pulmonary hypertension due to lung diseases and hypoxia: Secondary | ICD-10-CM

## 2021-08-15 DIAGNOSIS — Z515 Encounter for palliative care: Secondary | ICD-10-CM

## 2021-08-15 NOTE — Progress Notes (Signed)
? ? ? ?Previous [facility] pulmonary encounter: ?07/04/20- Dr. Chase Caller  ?Referred to the ILD center for comprehensive evaluation by Dr. Rodman Pickle primary pulmonologist ?76 year old male, never smoked.  Significant for chronic hypoxic respiratory failure, coronary artery disease, hypertension.  Patient of Dr. Chase Caller. Started OFEV in April 2022.  ? ?[name] 76 y.o. -very complicated story.  Details of the story is copied and pasted above.  Meeting him for the first time with his wife.  It appears that he was previously healthy as of April 2021.  Although CT scan of the chest at the time of my personal visualization showed presence of early ILD [CT abdomen 10 years prior in 2011 had no ILD].  He was diagnosed with anorectal cancer.  He was undergoing treatment at [facility].  Chemotherapy as above.  Then in the fall/winter 2020 when he started falling ill with respiratory illnesses.  This is all documented above.  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  CT scan of the chest by October 2021 started showing significant amount of groundglass opacities.  He only had ANA test, rheumatoid factor and angiotensin-converting enzyme.  This is all negative.  For serology.  Otherwise has not had any other serology.  He had hypersensitive pneumonitis panel this was negative.  This was at outside facility [facility] clinic pulmonology program.  He has had 2 hospitalizations the most recent one being in January.  It appears that he was treated with prednisone each time.  It is unclear to me as this to him and his wife if he has been on chronic prednisone but reading the notes it appears so.  Currently is just finished a taper starting at 60 mg/day in January and finished at 20 mg and stop.  He says since stopping yesterday or today he is beginning to feel a little bit worse.  Room air oxygen at rest was fine but when he desaturated after he walked more than 2 laps.  However he needed 5 L to correct.  Most recent  echocardiogram was normal.  Most recent lavage showed mostly neutrophils.  Again culture negative.  His most recent CT scan of the chest in February 2022 without contrast shows improvement in groundglass opacities but no fibrosis.  His serologies ANA negative and rheumatoid factor negative and angiotensin converting enzyme negative ? ?Definitely earlier on in the year it was not consistent with UIP and suggested alternate diagnosis.  Unclear to me if he has probable UIP pattern at this point in time in February 2022. ? ?We did not have time to do an extensive detailed ILD questionnaire. ? ?It appears that his goals are to get better and also feel better at the same time control his cancer.  They are also wondering about prognosis. ? 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  He is currently looking into getting radiation locally.  Oncologist wants to do a colonoscopy.  She feels the recurrence is because all his chemo has been on hold because of respiratory issues. ? ? ?SErology Dec 2021 - [place] ? ?ANA Direct - [facility] Negative Negative  ? ?RA Latex Turbid. - [facility] 0.0 - 13.9 IU/mL <10.0  ? ?A.Fumigatus #1 Abs - [facility] Negative Negative  ?Micropolyspora faeni, IgG - [facility] Negative Negative  ?Thermoactinomyces vulgaris, IgG - [facility] Negative Negative  ?A. Pullulans Abs -  [facility] Negative Negative  ?Thermoact. Saccharii - [facility] Negative Negative  ?Pigeon Serum Abs - [facility] Negative Negative  ? ?Angio Convert Enzyme - [facility] 14 - 82 U/L 26  ? ?Walk tst 07/04/2020 ? ?  94% - ra at rest -> walked 2 laps without desat and dropped to 86% in middle of 3rd lap. Then need 5L [place] to correct to walk 3 laps in office ? ? ?CT chest 06/23/20 ? ? ?IMPRESSION: ?1. 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 Fibrosis has progressed since 03/02/2020 chest CT with ?resolved consolidative opacities. 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 UIP is difficult to exclude but is less ?favored. Follow-up high-resolution chest CT suggested in 6-12 ?months. Findings are indeterminate for UIP per consensus guidelines: ?Diagnosis of Idiopathic Pulmonary Fibrosis: An Official ?ATS/ERS/JRS/ALAT Clinical Practice Guideline. [place] ?Med Vol 198, Iss 5, [id number], Jan 11 2017. ?2. Stable mild cardiomegaly. ?3. Aortic Atherosclerosis (ICD10-I70.0). ?  ?  ?Electronically Signed ?  By: [name] M.D. ?  On: 06/24/2020 15:15 ?   ? ? ?08/02/2020 ?Patient presents today for 1 month follow-up with spirometry/DLCO. Patient was discussed at ILD conference in March 2022, diagnosed with IPF. Recent exacerbation likely chemo related. Started on Ofev. Patient was referred for right heart cath and referred to pulmonary rehab. ? ?He is currently on an extended prednisone taper. He started 45m prednisone dose today x 2 weeks. He is still taking Bactrim. Breathing has improved. He has not started antifibrotic medication yet, his wifes states that their insurance has improved OFEV medication.  ? ?PFT 08/02/2020 - FVC 1.61 (41%), FEV1 1.55 (55%), ratio 96, DLCOcor 12.27 (52%) ?09/21/2020 ?Patient presents today for 1 month follow-up. Accompanied by his son. He stop pulmonary for a week or two since having bronchitis. Cough is better since taking mucinex. He is on 4L nasal cannula. He is having 6-7 loose stools a day. Diarrhea started when he went up to 2 tabs of Ofev. He has up coming hearth cath, date to be determined. Son reports some new leg swelling. Due for LFTs today.  ? ?HEart cth  10/03/20 ? ?Conclusions: ?Severe native coronary artery disease including 70% distal LMCA and mid LAD disease, sequential 100% ostial and 99% proximal LCx lesions, and chronic total occlusion of ostial RCA. ?Widely patent LIMA-LAD and SVG-rPDA. ?Patent SVG-OM2 with 40% stenosis in proximal/mid portion of SVG. ?Mildly elevated left heart filling pressure (LVEDP 20 mmHg, PCWP 23 mmHg). ?Moderately elevated right heart filling pressure (mean RAP 14 mmHg, LVEDP 20 mmHg). ?Moderate pulmonary hypertension (mean PAP 35 mmHg, PVR 2.6 WU). ?Mildly reduced cardiac output/index. ?  ?Recommendations: ?Aggressive secondary prevention of coronary artery disease. ?Initiate furosemide 40 mg PO daily for HFpEF.  BMP to be drawn when patient is seen for follow-up 10/13/2020. ?Ongoing management of pulmonary fibrosis and pulmonary hypertension per Dr. RChase Caller ?  ?CNelva Bush MD ?CPerryman?  ? ? ? ?OV 10/18/2020 ? ?Subjective:  ?Patient ID: PDAISHAWN LAUF male , DOB: 102-03-1946, age 76y.o. , MRN: [id number], [place] [id number]?PCP TAlbina Billet MD ?Patient Care Team: ?TAlbina Billet MD as PCP - General (Internal Medicine) ?ENelva Bush MD as PCP - Cardiology (Cardiology) ?SClent Jacks RN as  Oncology Nurse Navigator ?Earlie Server, MD as Consulting Physician (Oncology) ? ?This Provider for this visit: Treatment Team:  ?Attending Provider: Brand Males, MD ? ?Type of visit: Telephone/Video ?Circumstance: COVID-19 national emergency ?Identification of patient DEMARKUS REMMEL with 06-26-1945 and MRN [id number] - 2 person identifier ?Risks: Risks, benefits, limitations of telephone visit explained. Patient understood and verbalized agreement to proceed ?Anyone else on call: just patient ?Patient location: [phone] ?This provider location: Provider home because provider is isolating due to covid ? ? ?10/18/2020 -  followup IPF. ? ? ?HPI ?[name] 76 y.o. - Off ofev since  09/21/20 due to diarrrhea. He called 09/28/20 and we told him to stop ofev.  But now he tells me 10/18/2020 that for 3 weeks he is taking 160m bid. No diarrhea other than going to BR frequently but is tolerabl and small amounts on

## 2021-08-15 NOTE — Patient Instructions (Addendum)
IPF (idiopathic pulmonary fibrosis) ([place]) ? ? ?- progressive over time - now 6-8L [place] ?- esbriet suppresing appetite v cancer ? ?Plan ?- Stop esbriet for 10 days and then restart  1 pill three times daily x 1 week, 2 pills thre time sdaily x 1 weeks and then 3 pills three times daily ?Continue prednisone as before 38m per day for now ? ? ? ?WHO group 3 pulmonary arterial hypertension (HIva ? ?-Stable and tyvaso helping ? ?Plan ?- Continue inhaled Tyvaso dpi ?- continue lasix 441mtwice daily ? ?Chronic hypoxemic respiratory failure (HCC) ? ?-Seems to needing only 6 L nasal cannula at rest to 8L [place] ? ?Plan ?- Always monitor your pulse ox with exertion and keep it over 88% ?- do not recommend stopping ? ? ?Hospice/DNR ? ?- due to progression in cancer ? ?Plan ? - per hos[pice ?- would contnue IPF drugs as long as $ was not an issue for hospice ? ?FOllowup ? - video vist in 3-5  weeks with Dr RaChase Callerbut can be aPP) ?

## 2021-08-15 NOTE — Telephone Encounter (Signed)
Pt had a video visit with MR today 4/5 and plan per MR is for pt to have another video visit with either himself or APP in 3-5 weeks. ? ? ?Attempted to call pt to see if we could get the video visit scheduled but unable to reach pt. Left message for him to return call. ? ?When pt returns call, please schedule pt a 76mn video visit with MR.  ?

## 2021-08-20 NOTE — Telephone Encounter (Signed)
Routing to front desk pool to help getting pt scheduled for a video visit. ?

## 2021-08-20 NOTE — Telephone Encounter (Signed)
Call and scheduled pt for 10/11/2021 at 10:30am ?

## 2021-08-29 ENCOUNTER — Telehealth: Payer: Self-pay | Admitting: *Deleted

## 2021-08-29 MED ORDER — MORPHINE SULFATE (CONCENTRATE) 10 MG /0.5 ML PO SOLN: 2.5000 mg | ORAL | 0 refills | Status: DC | PRN

## 2021-08-29 NOTE — Telephone Encounter (Signed)
Vivien Rota called reporting that patient has has a strange nondescript substernal chest pain for past 3 days and patient who never asks for pain medicine is asking for some medicine for pain. Please advise ?

## 2021-08-29 NOTE — Telephone Encounter (Signed)
I spoke with hospice nurse. Patient is not interested in workup and would like pain meds/comfort. Will start low dose morphine elixir.  ?

## 2021-09-03 ENCOUNTER — Telehealth: Payer: Self-pay | Admitting: *Deleted

## 2021-09-03 NOTE — Telephone Encounter (Signed)
Incoming fax from pharmacy requesting refill for Potassium, but I do not see that patient is on or has been on potassium ? ? ?Component Ref Range & Units 2 mo ago ?(07/03/21) 2 mo ago ?(06/21/21) 2 mo ago ?(06/20/21) 2 mo ago ?(06/19/21) 2 mo ago ?(06/19/21) 5 mo ago ?(03/21/21) 5 mo ago ?(03/21/21)  ?Potassium 3.5 - 5.1 mmol/L 4.1  3.8  3.9   3.5  3.9 R    ? ?

## 2021-09-07 ENCOUNTER — Encounter: Payer: Self-pay | Admitting: Oncology

## 2021-09-17 ENCOUNTER — Other Ambulatory Visit: Payer: Self-pay | Admitting: *Deleted

## 2021-09-17 MED ORDER — POTASSIUM CHLORIDE CRYS ER 20 MEQ PO TBCR: 20.0000 meq | EXTENDED_RELEASE_TABLET | Freq: Every day | ORAL | 0 refills | Status: DC

## 2021-09-17 NOTE — Telephone Encounter (Signed)
?

## 2021-10-10 ENCOUNTER — Encounter: Payer: Medicare Other | Admitting: Dermatology

## 2021-10-11 ENCOUNTER — Telehealth (INDEPENDENT_AMBULATORY_CARE_PROVIDER_SITE_OTHER): Admitting: Internal Medicine

## 2021-10-11 DIAGNOSIS — I2723 Pulmonary hypertension due to lung diseases and hypoxia: Secondary | ICD-10-CM

## 2021-10-11 DIAGNOSIS — Z515 Encounter for palliative care: Secondary | ICD-10-CM

## 2021-10-11 DIAGNOSIS — J9611 Chronic respiratory failure with hypoxia: Secondary | ICD-10-CM | POA: Diagnosis not present

## 2021-10-11 DIAGNOSIS — R0609 Other forms of dyspnea: Secondary | ICD-10-CM

## 2021-10-11 DIAGNOSIS — Z66 Do not resuscitate: Secondary | ICD-10-CM

## 2021-10-11 DIAGNOSIS — Z5181 Encounter for therapeutic drug level monitoring: Secondary | ICD-10-CM

## 2021-10-11 DIAGNOSIS — J84112 Idiopathic pulmonary fibrosis: Secondary | ICD-10-CM | POA: Diagnosis not present

## 2021-10-11 NOTE — Addendum Note (Signed)
Addended by: Lorretta Harp on: 10/11/2021 11:08 AM   Modules accepted: Orders

## 2021-10-11 NOTE — Patient Instructions (Addendum)
IPF (idiopathic pulmonary fibrosis) ([place]) WHO group 3 pulmonary arterial hypertension (HCC) Chronic hypoxemic respiratory failure ([place])  - progressive over time - now 6-8L [place] but recently ? Improved /stable due to tyvaso - noted off esbriet due to GI issues  Plan -spirometry/dlco in 6 weeks - in BRL - Continue prednisone as before 74m per day for now - Continue inhaled Tyvaso dpi - continue lasix 464mtwice daily -Seems to needing only 6 L nasal cannula at rest to 8L [place] - monitor off esbriet - can assess need at followup  Hospice/DNR  - due to progression in cancer  Plan  - per hos[pice   FOllowup - - visit with APP In 6 weeeks in BRL clinic - onsite /face to face  - video vist in 12-14  weeks with Dr RaChase Caller

## 2021-10-11 NOTE — Progress Notes (Signed)
OV 10/11/2021  Subjective:  Patient ID: Garrett Peterson, male , DOB: February 13, 1946 , age 76 y.o. , MRN: [id number] , ADDRESS: [place] [id number] PCP Albina Billet, MD Patient Care Team: Albina Billet, MD as PCP - General (Internal Medicine) End, Harrell Gave, MD as PCP - Cardiology (Cardiology) Clent Jacks, RN as Oncology Nurse Navigator Earlie Server, MD as Consulting Physician (Oncology)  This Provider for this visit: Treatment Team:  Attending Provider: Brand Males, MD  Type of visit: Telephone/Video Circumstance: COVID-19 national emergency Identification of patient Garrett Peterson with 09-02-1945 and MRN [id number] - 2 person identifier Risks: Risks, benefits, limitations of telephone visit explained. Patient understood and verbalized agreement to proceed Anyone else on call: grandson Garrett Peterson Patient location: his home [place] This provider location: [employer] , [address]; [place]; [place] [address]. [address] Pulmonary Office. [phone]    IPF Chronic hypoixemic resp failure Hospice due to cancer  10/11/2021 -     HPI Garrett Peterson 76 y.o. -feels better. Feels he can do more int he house. Less desatruations now . Using 6L at rest.  STopped esbriet due to gi issues. On pred 23m +. On Tyvaso dpi 68m dpi QID. Tolerating it fine. Re his cancer - hospice checking on him qweek. DEnies pain. No idea about progression because he is not going to cancer clinic. Oncology is his primary doc. MAking good urine. Appetite better. Eating well. Modestly active in hous. Doing ADLs.    PFT     Latest Ref Rng & Units 04/25/2021    9:21 AM 12/19/2020    9:58 AM 08/02/2020   10:34 AM  PFT Results  FVC-Pre L 1.51   1.54   1.61    FVC-Predicted Pre % 39   39   41    Pre FEV1/FVC % % 90   85   96    FEV1-Pre L 1.36   1.31   1.55    FEV1-Predicted Pre % 49   47   55    DLCO uncorrected ml/min/mmHg 7.61   13.12   12.41    DLCO UNC% % 32    55   52    DLCO corrected ml/min/mmHg 7.79   13.12   12.27    DLCO COR %Predicted % 33   55   52    DLVA Predicted % 91   91   103         has a past medical history of (HFpEF) heart failure with preserved ejection fraction (HCHartford Actinic keratosis, Adenomatous polyp of colon, Arthritis, Basal cell carcinoma, CAD (coronary artery disease), Cancer (HCM[place](2021), Hyperlipidemia, Hypertension, Interstitial lung disease (HCLevittown Pneumonia, Pre-diabetes, Respiratory failure with hypoxia (HCJoliet and 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   reports that he has never smoked. He has quit using smokeless tobacco.  His smokeless tobacco use included chew.  Past Surgical History:  Procedure Laterality Date   APPENDECTOMY     BRONCHIAL WASHINGS  05/18/2020   Procedure: BRONCHIAL WASHINGS;  Surgeon: ElMargaretha SeedsMD;  [facility] Service: Pulmonary;;   CARDIAC CATHETERIZATION     PT DENIES   COLONOSCOPY     2011,2015,2021   COLONOSCOPY WITH PROPOFOL N/A 07/10/2020   Procedure: COLONOSCOPY WITH PROPOFOL;  Surgeon: Toledo, TeBenay PikeMD;  Location: [facility] ENDOSCOPY;  Service: Gastroenterology;  Laterality: N/A;   CORONARY ARTERY BYPASS GRAFT  FLEXIBLE BRONCHOSCOPY N/A 04/24/2020   Procedure: FLEXIBLE BRONCHOSCOPY;  Surgeon: Ottie Glazier, MD;  Location: [facility] ORS;  Service: Thoracic;  Laterality: N/A;   IR SINUS/FIST TUBE CHK-NON GI  10/27/2019   open heart surgery     PORTACATH PLACEMENT Right 10/13/2019   Procedure: INSERTION PORT-A-CATH;  Surgeon: Herbert Pun, MD;  Location: [facility] ORS;  Service: General;  Laterality: Right;   RIGHT/LEFT HEART CATH AND CORONARY ANGIOGRAPHY N/A 10/03/2020   Procedure: RIGHT/LEFT HEART CATH AND CORONARY ANGIOGRAPHY;  Surgeon: Nelva Bush, MD;  Location: [facility];  Service: Cardiovascular;  Laterality: N/A;   ROBOT ASSISTED LAPAROSCOPIC PARTIAL COLECTOMY  09/2019   VIDEO BRONCHOSCOPY N/A 05/18/2020   Procedure: VIDEO  BRONCHOSCOPY WITHOUT FLUORO;  Surgeon: Margaretha Seeds, MD;  Location: [facility];  Service: Pulmonary;  Laterality: N/A;    Allergies  Allergen Reactions   Ofev [Nintedanib] Diarrhea and Nausea Only    Immunization History  Administered Date(s) Administered   Influenza, High Dose Seasonal PF 03/19/2021   PFIZER(Purple Top)SARS-COV-2 Vaccination 06/09/2019, 06/30/2019, 01/18/2020   Zoster Recombinat (Shingrix) 01/12/2018, 03/21/2018    Family History  Problem Relation Age of Onset   Lung disease Father      Current Outpatient Medications:    aspirin EC 81 MG tablet, Take 1 tablet (81 mg total) by mouth daily. Swallow whole., Disp: , Rfl:    atorvastatin (LIPITOR) 20 MG tablet, Take 20 mg by mouth daily., Disp: , Rfl:    furosemide (LASIX) 40 MG tablet, Take 1 tablet (40 mg total) by mouth 2 (two) times daily for 7 days., Disp: 30 tablet, Rfl: 5   metoprolol tartrate (LOPRESSOR) 25 MG tablet, Take 25 mg by mouth 2 (two) times daily., Disp: , Rfl:    Morphine Sulfate (MORPHINE CONCENTRATE) 10 mg / 0.5 ml concentrated solution, Take 0.13-0.25 mLs (2.6-5 mg total) by mouth every 2 (two) hours as needed for severe pain or shortness of breath., Disp: 30 mL, Rfl: 0   Pirfenidone (ESBRIET) 267 MG TABS, Take 2 tablets (534 mg total) by mouth in the morning, at noon, and at bedtime., Disp: 540 tablet, Rfl: 3   potassium chloride SA (KLOR-CON M) 20 MEQ tablet, Take 1 tablet (20 mEq total) by mouth daily., Disp: 15 tablet, Rfl: 0   predniSONE (DELTASONE) 5 MG tablet, Take 1 tablet (5 mg total) by mouth daily with breakfast., Disp: 30 tablet, Rfl: 2   temazepam (RESTORIL) 30 MG capsule, Take 30 mg by mouth at bedtime. , Disp: , Rfl:    Treprostinil (TYVASO DPI MAINTENANCE KIT) 64 MCG POWD, Inhale 64 mcg into the lungs in the morning, at noon, in the evening, and at bedtime., Disp: , Rfl:       Objective:   There were no vitals filed for this visit.  Estimated body mass index is 32.16  kg/m as calculated from the following:   Height as of 06/28/21: _0  (1.753 m).   Weight as of 07/10/21: 217 lb 12.8 oz (98.8 kg).  _1 @  There were no vitals filed for this visit.   Physical Exam  General: No distress. Looks well better. O2 on Neuro: Alert and Oriented x 3. GCS 15. Speech normal Psych: Pleasant         Assessment:       ICD-10-CM   1. IPF (idiopathic pulmonary fibrosis) ([place])  J84.112     2. DOE (dyspnea on exertion)  R06.09     3. Chronic hypoxemic respiratory failure ([place])  J96.11     4. WHO group 3 pulmonary arterial hypertension (HCC)  I27.23     5. Hospice care patient  Z51.5     6. DNR (do not resuscitate)  Z66     7. Medication monitoring encounter  Z51.81          Plan:     Patient Instructions  IPF (idiopathic pulmonary fibrosis) ([place]) WHO group 3 pulmonary arterial hypertension ([place]) Chronic hypoxemic respiratory failure ([place])  - progressive over time - now 6-8L [place] but recently ? Improved /stable due to tyvaso - noted off esbriet due to GI issues  Plan -spirometry/dlco in 6 weeks - in BRL - Continue prednisone as before 60m per day for now - Continue inhaled Tyvaso dpi - continue lasix 445mtwice daily -Seems to needing only 6 L nasal cannula at rest to 8L [place] - monitor off esbriet - can assess need at followup  Hospice/DNR  - due to progression in cancer  Plan  - per hos[pice   FOllowup - - visit with APP In 6 weeeks in BRL clinic - onsite /face to face  - video vist in 12-14  weeks with Dr RaChase Caller Complex medical conditio requiring intensive therapeutic monitoring - needs PFTlabs  SIGNATURE    Dr. MuBrand MalesM.D., F.C.C.P,  Pulmonary and Critical Care Medicine Staff Physician, CoLittle Browningirector - Interstitial Lung Disease  Program  Pulmonary [id number]: [phone] no answer or between   15:00h - 7:00h: call [phone] Telephone: [phone]  11:01 AM 10/11/2021

## 2021-11-27 ENCOUNTER — Other Ambulatory Visit: Payer: Self-pay | Admitting: *Deleted

## 2021-11-27 MED ORDER — TRAMADOL HCL 50 MG PO TABS
50.0000 mg | ORAL_TABLET | Freq: Four times a day (QID) | ORAL | 0 refills | Status: DC | PRN
Start: 2021-11-27 — End: 2022-07-16

## 2021-11-27 NOTE — Telephone Encounter (Signed)
Patient refusing to use Roxanol, but has agreed to something weaker such as Tramadol. Please send prescription to [facility]

## 2021-12-03 ENCOUNTER — Other Ambulatory Visit: Payer: Self-pay | Admitting: Oncology

## 2021-12-17 ENCOUNTER — Other Ambulatory Visit: Payer: Self-pay | Admitting: Oncology

## 2021-12-17 NOTE — Telephone Encounter (Signed)
Component Ref Range & Units 5 mo ago (07/03/21) 5 mo ago (06/21/21) 6 mo ago (06/20/21) 6 mo ago (06/19/21) 6 mo ago (06/19/21) 9 mo ago (03/21/21) 9 mo ago (03/21/21)  Potassium 3.5 - 5.1 mmol/L 4.1  3.8  3.9   3.5  3.9 R

## 2021-12-18 ENCOUNTER — Encounter: Payer: Self-pay | Admitting: Oncology

## 2021-12-25 ENCOUNTER — Telehealth: Payer: Self-pay | Admitting: *Deleted

## 2021-12-25 NOTE — Telephone Encounter (Signed)
Verbal order called to Vivien Rota, left message on her voice mail

## 2021-12-25 NOTE — Telephone Encounter (Signed)
Garrett Peterson with hospice called reporting that patient is having problems with mucous and is asking if she can get an order for Mucinex 1200 mg twice a day. Please advise

## 2022-01-10 ENCOUNTER — Ambulatory Visit: Attending: Internal Medicine

## 2022-01-10 DIAGNOSIS — J84112 Idiopathic pulmonary fibrosis: Secondary | ICD-10-CM | POA: Insufficient documentation

## 2022-01-10 LAB — PULMONARY FUNCTION TEST ARMC ONLY
DL/VA % pred: 85 %
DL/VA: 3.39 ml/min/mmHg/L
DLCO unc % pred: 32 %
DLCO unc: 7.61 ml/min/mmHg
FEF 25-75 Pre: 1.84 L/sec
FEF2575-%Pred-Pre: 94 %
FEV1-%Pred-Pre: 44 %
FEV1-Pre: 1.21 L
FEV1FVC-%Pred-Pre: 122 %
FEV6-%Pred-Pre: 38 %
FEV6-Pre: 1.37 L
FEV6FVC-%Pred-Pre: 107 %
FVC-%Pred-Pre: 35 %
FVC-Pre: 1.37 L
Pre FEV1/FVC ratio: 88 %
Pre FEV6/FVC Ratio: 100 %

## 2022-01-11 ENCOUNTER — Encounter: Payer: Self-pay | Admitting: Oncology

## 2022-01-22 ENCOUNTER — Encounter: Payer: Self-pay | Admitting: Internal Medicine

## 2022-01-22 ENCOUNTER — Telehealth (INDEPENDENT_AMBULATORY_CARE_PROVIDER_SITE_OTHER): Admitting: Internal Medicine

## 2022-01-22 DIAGNOSIS — J84112 Idiopathic pulmonary fibrosis: Secondary | ICD-10-CM | POA: Diagnosis not present

## 2022-01-22 DIAGNOSIS — I2723 Pulmonary hypertension due to lung diseases and hypoxia: Secondary | ICD-10-CM

## 2022-01-22 DIAGNOSIS — J9611 Chronic respiratory failure with hypoxia: Secondary | ICD-10-CM | POA: Diagnosis not present

## 2022-01-22 DIAGNOSIS — Z515 Encounter for palliative care: Secondary | ICD-10-CM

## 2022-01-22 NOTE — Progress Notes (Signed)
OV 01/22/2022  Subjective:  Patient ID: Garrett Peterson, male , DOB: Feb 06, 1946 , age 76 y.o. , MRN: [id number] , ADDRESS: [place] [id number] PCP Albina Billet, MD Patient Care Team: Albina Billet, MD as PCP - General (Internal Medicine) End, Harrell Gave, MD as PCP - Cardiology (Cardiology) Clent Jacks, RN as Oncology Nurse Navigator Earlie Server, MD as Consulting Physician (Oncology)  This Provider for this visit: Treatment Team:  Attending Provider: Brand Males, MD  Type of visit: Video Circumstance: x Identification of patient Garrett Peterson with 1945/08/13 and MRN [id number] - 2 person identifier Risks: Risks, benefits, limitations of telephone visit explained. Patient understood and verbalized agreement to proceed Anyone else on call: grandson Donato Schultz Patient location: his home This provider location: [address]; [place]; [place] [address]. [facility]. [phone]    01/22/2022 -  No chief complaint on file.    HPI [name] 76 y.o. -on tyvaso dPI full dose. Using 8L [place]. Dyspnea stable. Tolerting tyvaso well. No issue WEight stable. Mucus probles is the main quality of life issue but not on all days. Some days days 7/10 severity. White color. Nose and throat. Not taking mucinex or nasal saline. Does take xyzal. Hospcice nurse has given him nebulizer  - has given him duoneb but he has only used it 3x. Did feel "rough" after dupneb -> jittery. Advised to try to get ipratropium nebulizer alone . Currently still not intereted in esbriet rechallenge due to side effect profile and hospice diagnosis  PFT shows decline esp in past 1 year but last 8 months DLCO stable though FVC decline  Cancer: on hospice at home. Hospice nurse visiting him. Oncologist is hospice attending. Not seen in onc office    CT Chest data  No results found.    PFT     Latest Ref Rng & Units 01/10/2022    1:27 PM  04/25/2021    9:21 AM 12/19/2020    9:58 AM 08/02/2020   10:34 AM  PFT Results  FVC-Pre L 1.37  1.51  1.54  1.61   FVC-Predicted Pre % 35  39  39  41   Pre FEV1/FVC % % 88  90  85  96   FEV1-Pre L 1.21  1.36  1.31  1.55   FEV1-Predicted Pre % 44  49  47  55   DLCO uncorrected ml/min/mmHg 7.61  7.61  13.12  12.41   DLCO UNC% % 32  32  55  52   DLCO corrected ml/min/mmHg  7.79  13.12  12.27   DLCO COR %Predicted %  33  55  52   DLVA Predicted % 85  91  91  103        has a past medical history of (HFpEF) heart failure with preserved ejection fraction ([place]), Actinic keratosis, Adenomatous polyp of colon, Arthritis, Basal cell carcinoma, CAD (coronary artery disease), Cancer ([place]) (2021), Hyperlipidemia, Hypertension, Interstitial lung disease ([place]), Pneumonia, Pre-diabetes, Respiratory failure with hypoxia ([place]), and 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   reports that he has never smoked. He has quit using smokeless tobacco.  His smokeless tobacco use included chew.  Past Surgical History:  Procedure Laterality Date   APPENDECTOMY     BRONCHIAL WASHINGS  05/18/2020   Procedure: BRONCHIAL WASHINGS;  Surgeon: Margaretha Seeds, MD;  Location: [facility];  Service: Pulmonary;;  CARDIAC CATHETERIZATION     PT DENIES   COLONOSCOPY     2011,2015,2021   COLONOSCOPY WITH PROPOFOL N/A 07/10/2020   Procedure: COLONOSCOPY WITH PROPOFOL;  Surgeon: Toledo, Teodoro K, MD;  Location: [facility];  Service: Gastroenterology;  Laterality: N/A;   CORONARY ARTERY BYPASS GRAFT     FLEXIBLE BRONCHOSCOPY N/A 04/24/2020   Procedure: FLEXIBLE BRONCHOSCOPY;  Surgeon: Aleskerov, Fuad, MD;  Location: [facility];  Service: Thoracic;  Laterality: N/A;   IR SINUS/FIST TUBE CHK-NON GI  10/27/2019   open heart surgery     PORTACATH PLACEMENT Right 10/13/2019   Procedure: INSERTION PORT-A-CATH;  Surgeon: Cintron-Diaz, Edgardo, MD;  Location: [facility];  Service: General;  Laterality: Right;   RIGHT/LEFT  HEART CATH AND CORONARY ANGIOGRAPHY N/A 10/03/2020   Procedure: RIGHT/LEFT HEART CATH AND CORONARY ANGIOGRAPHY;  Surgeon: End, Christopher, MD;  Location: [facility];  Service: Cardiovascular;  Laterality: N/A;   ROBOT ASSISTED LAPAROSCOPIC PARTIAL COLECTOMY  09/2019   VIDEO BRONCHOSCOPY N/A 05/18/2020   Procedure: VIDEO BRONCHOSCOPY WITHOUT FLUORO;  Surgeon: Ellison, Chi Jane, MD;  [facility];  Service: Pulmonary;  Laterality: N/A;    Allergies  Allergen Reactions   Ofev [Nintedanib] Diarrhea and Nausea Only    Immunization History  Administered Date(s) Administered   Influenza, High Dose Seasonal PF 03/19/2021   PFIZER(Purple Top)SARS-COV-2 Vaccination 06/09/2019, 06/30/2019, 01/18/2020   Zoster Recombinat (Shingrix) 01/12/2018, 03/21/2018    Family History  Problem Relation Age of Onset   Lung disease Father      Current Outpatient Medications:    aspirin EC 81 MG tablet, Take 1 tablet (81 mg total) by mouth daily. Swallow whole., Disp: , Rfl:    atorvastatin (LIPITOR) 20 MG tablet, Take 20 mg by mouth daily., Disp: , Rfl:    furosemide (LASIX) 40 MG tablet, Take 1 tablet (40 mg total) by mouth 2 (two) times daily for 7 days., Disp: 30 tablet, Rfl: 5   metoprolol tartrate (LOPRESSOR) 25 MG tablet, Take 25 mg by mouth 2 (two) times daily., Disp: , Rfl:    Morphine Sulfate (MORPHINE CONCENTRATE) 10 mg / 0.5 ml concentrated solution, Take 0.13-0.25 mLs (2.6-5 mg total) by mouth every 2 (two) hours as needed for severe pain or shortness of breath., Disp: 30 mL, Rfl: 0   Pirfenidone (ESBRIET) 267 MG TABS, Take 2 tablets (534 mg total) by mouth in the morning, at noon, and at bedtime., Disp: 540 tablet, Rfl: 3   potassium chloride SA (KLOR-CON M) 20 MEQ tablet, Take 1 tablet (20 mEq total) by mouth daily., Disp: 15 tablet, Rfl: 0   predniSONE (DELTASONE) 5 MG tablet, Take 1 tablet (5 mg total) by mouth daily with breakfast., Disp: 30 tablet, Rfl: 2   temazepam  (RESTORIL) 30 MG capsule, Take 30 mg by mouth at bedtime. , Disp: , Rfl:    traMADol (ULTRAM) 50 MG tablet, Take 1 tablet (50 mg total) by mouth every 6 (six) hours as needed., Disp: 30 tablet, Rfl: 0   Treprostinil (TYVASO DPI MAINTENANCE KIT) 64 MCG POWD, Inhale 64 mcg into the lungs in the morning, at noon, in the evening, and at bedtime., Disp: , Rfl:       Objective:   There were no vitals filed for this visit.  Estimated body mass index is 32.16 kg/m as calculated from the following:   Height as of 06/28/21: 5' 9" (1.753 m).   Weight as of 07/10/21: 217 lb 12.8 oz (98.8 kg).  @WEIGHTCHANGE@    There were no vitals filed for this visit.   Physical Exam    General: No distress. Looks well Neuro: Alert and Oriented x 3. GCS 15. Speech normal Psych: Pleasant        Assessment:       ICD-10-CM   1. IPF (idiopathic pulmonary fibrosis) (HCC)  J84.112     2. Chronic hypoxemic respiratory failure (HCC)  J96.11     3. WHO group 3 pulmonary arterial hypertension (HCC)  I27.23     4. Hospice care patient  Z51.5          Plan:     Patient Instructions  IPF (idiopathic pulmonary fibrosis) (HCC) WHO group 3 pulmonary arterial hypertension (HCC) Chronic hypoxemic respiratory failure (HCC)  - progressive over time - now 6-8L [place] but stabile since last video visit -  off esbriet due to GI issues -on Tyvaso DPI and tolerting it well  Plan - Continue prednisone as before 5mg per day for now - Continue inhaled Tyvaso dpi full dose - continue lasix 40mg twice daily -Contine o2 as before - no more esbiret - for mucus: ask for ipratropium nebulizer and mucinex OTC  Hospice/DNR  - due to progression in cancer  Plan  - per hos[pice   FOllowup - - video vist in 8-12  weeks with Dr Ramaswamy   (Level 04: Estb 30-39 min  visit spent in total care time and counseling or/and coordination of care by this undersigned MD - Dr Murali Ramaswamy. This includes one or more of  the following on this same day 01/22/2022: pre-charting, chart review, note writing, documentation discussion of test results, diagnostic or treatment recommendations, prognosis, risks and benefits of management options, instructions, education, compliance or risk-factor reduction. It excludes time spent by the CMA or office staff in the care of the patient . Actual time is 30 min)   SIGNATURE    Dr. Murali Ramaswamy, M.D., F.C.C.P,  Pulmonary and Critical Care Medicine Staff Physician, [facility] Director - Interstitial Lung Disease  Program  [facility] [place], [place], [address]  Pager: [phone], If no answer or between  15:00h - 7:00h: call [phone] Telephone: [phone]  11:28 AM 01/22/2022  

## 2022-01-22 NOTE — Patient Instructions (Addendum)
IPF (idiopathic pulmonary fibrosis) (HCC) WHO group 3 pulmonary arterial hypertension (HCC) Chronic hypoxemic respiratory failure (HCC)  - progressive over time - now 6-8L [place] but stabile since last video visit -  off esbriet due to GI issues -on Tyvaso DPI and tolerting it well  Plan - Continue prednisone as before 86m per day for now - Continue inhaled Tyvaso dpi full dose - continue lasix 437mtwice daily -Contine o2 as before - no more esbiret - for mucus: ask for ipratropium nebulizer and mucinex OTC  Hospice/DNR  - due to progression in cancer  Plan  - per hos[pice   FOllowup - - video vist in 8-12  weeks with Dr RaChase Caller

## 2022-01-23 ENCOUNTER — Telehealth: Payer: Self-pay | Admitting: Internal Medicine

## 2022-01-23 NOTE — Telephone Encounter (Signed)
Nicole Kindred from [employer] called wanting a verbal order for patients nebulizer instructions since this changed at the last visit and a verbal order for the patient to get a flu shot.   Please advise- call back [phone].

## 2022-01-28 NOTE — Telephone Encounter (Signed)
[facility] hospice but was unalbe to reach anyone at that time. Will try again

## 2022-01-29 NOTE — Telephone Encounter (Signed)
Yes - high-dose flu shot

## 2022-01-29 NOTE — Telephone Encounter (Signed)
MR, are you ok with [place] giving a verbal for a flu shot for him? And if so, would you prefer the high dose?

## 2022-01-30 NOTE — Telephone Encounter (Signed)
Melinda from [facility] calling to get verbal for nebulizer instructions and flu vaccine.  Please call back at [phone].

## 2022-01-30 NOTE — Telephone Encounter (Signed)
Called and gave verbal orders for high dose flu shot. Nothing further needed

## 2022-02-08 ENCOUNTER — Encounter: Payer: Self-pay | Admitting: Oncology

## 2022-02-21 ENCOUNTER — Other Ambulatory Visit: Payer: Self-pay | Admitting: Internal Medicine

## 2022-02-21 IMAGING — CT CT IMAGE GUIDED DRAINAGE BY PERCUTANEOUS CATHETER
1 of 3 series · 12 of 32 positions shown, 18 images · non-contrast
Comparison: none

CLINICAL DATA: Status post low anterior resection to remove
rectosigmoid carcinoma. Contained anastomotic leak has been a
covered by imaging with development a left lower quadrant pelvic
fluid collection/abscess. The patient presents for drainage of the
fluid collection.

[Series 3: i-spiral 5.0 b30f · axial · 0.78mm/px · z∈[-384,-216]mm · 12 of 58 slices shown, 18 images]
[im 5/58  soft-tissue]
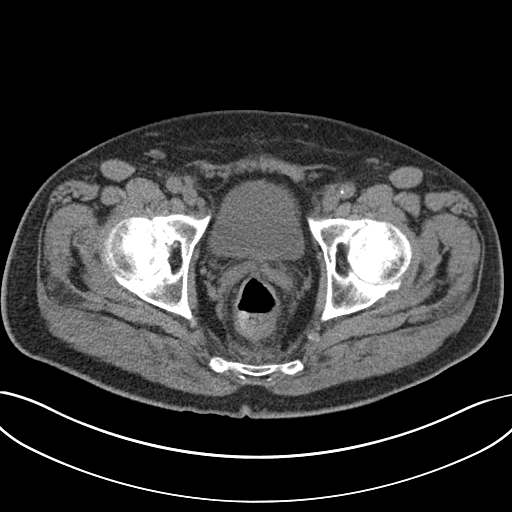
[im 5/58  bone]
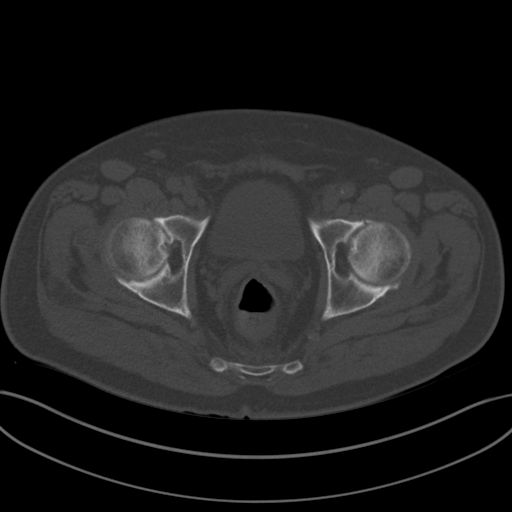
[im 9/58  soft-tissue]
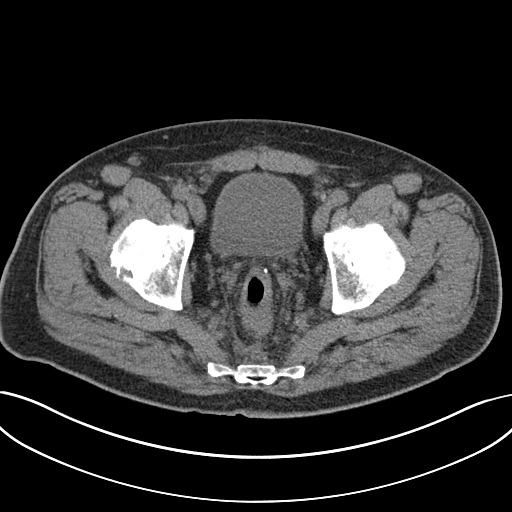
[im 14/58  soft-tissue]
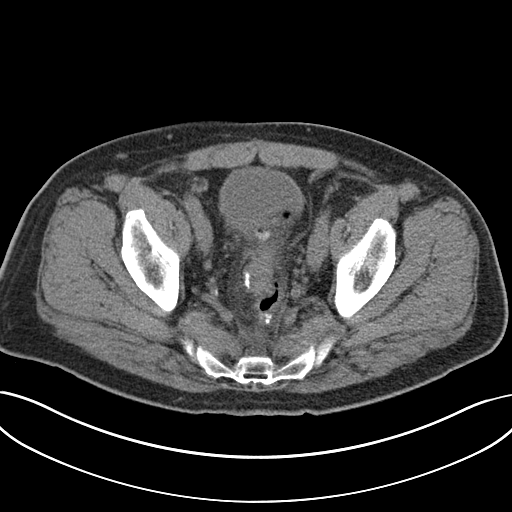
[im 18/58  soft-tissue]
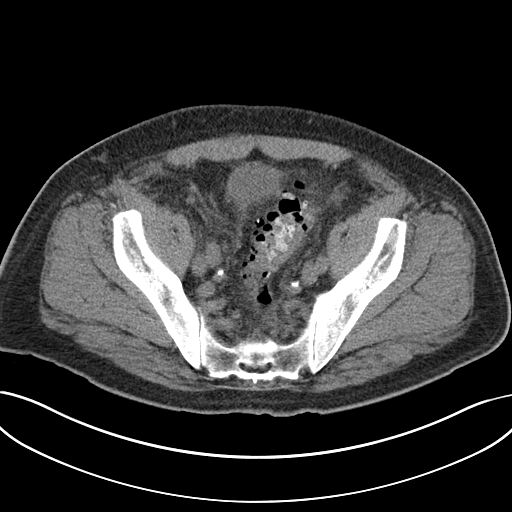
[im 22/58  soft-tissue]
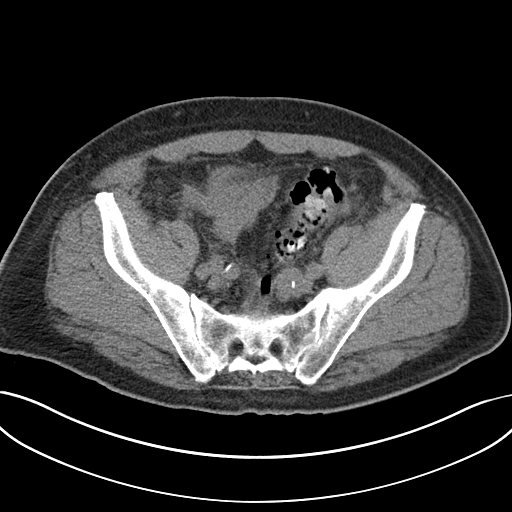
[im 27/58  soft-tissue]
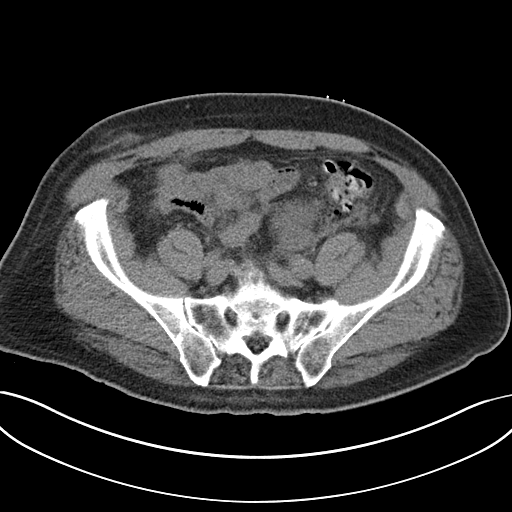
[im 31/58  soft-tissue]
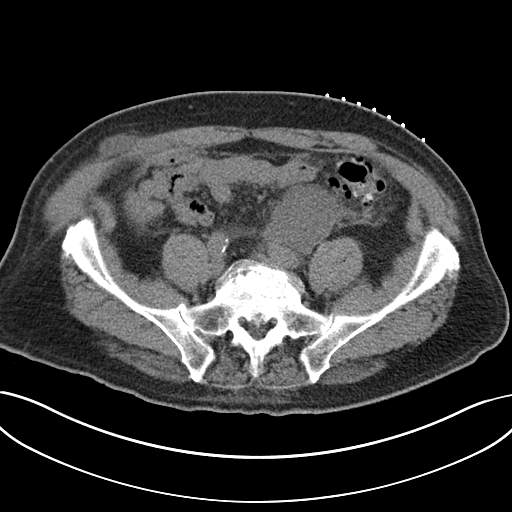
[im 36/58  soft-tissue]
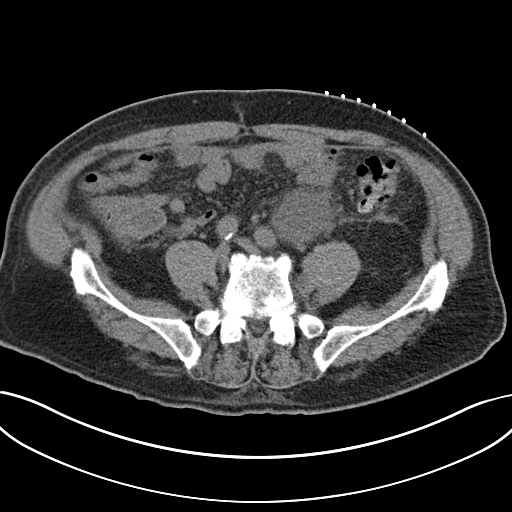
[im 40/58  soft-tissue]
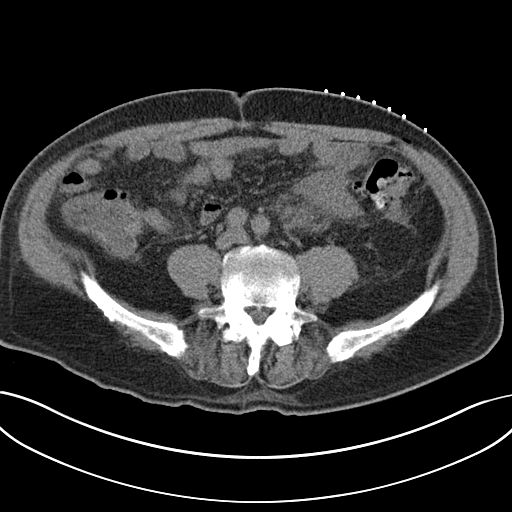
[im 40/58  lung]
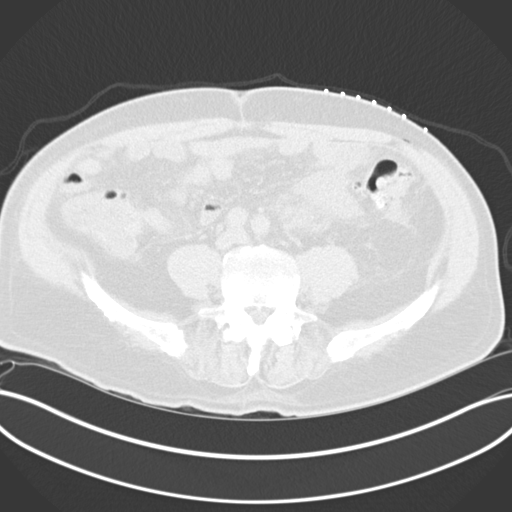
[im 40/58  bone]
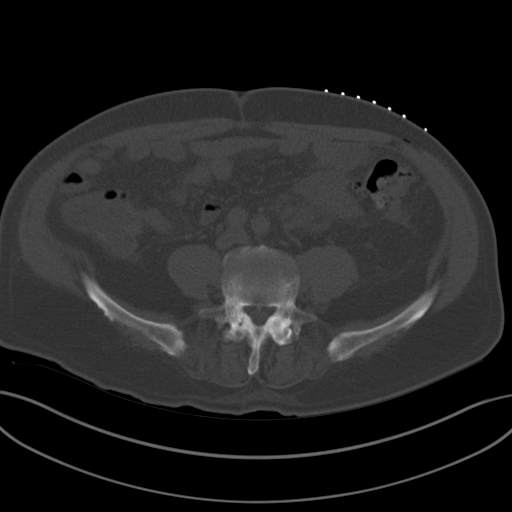
[im 44/58  soft-tissue]
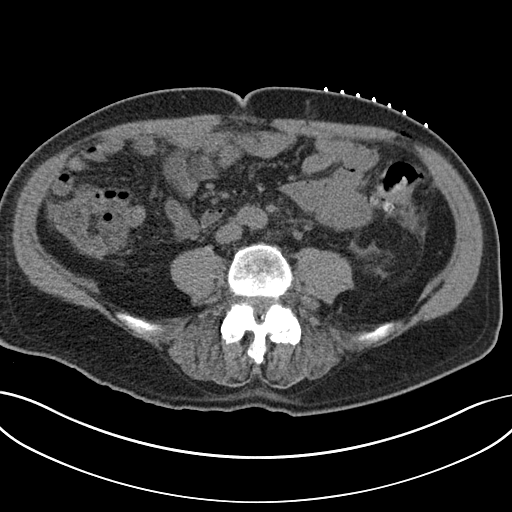
[im 44/58  lung]
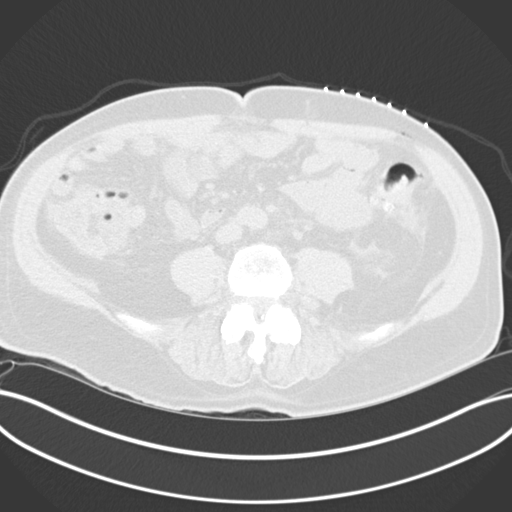
[im 49/58  soft-tissue]
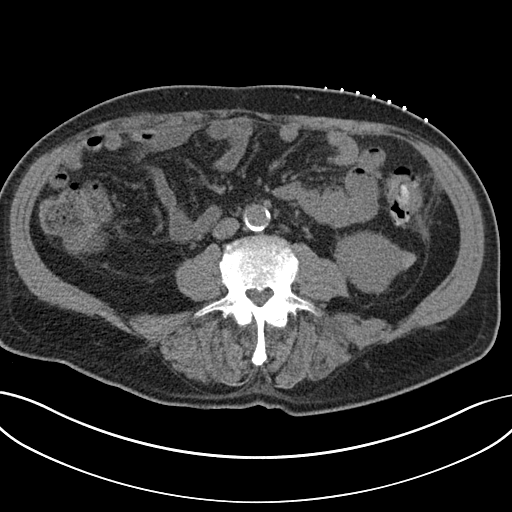
[im 49/58  lung]
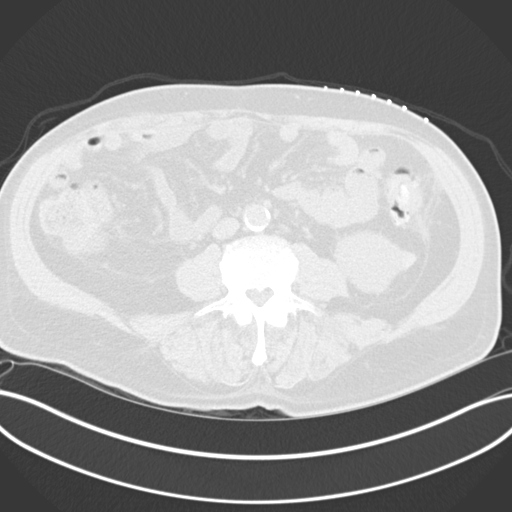
[im 53/58  soft-tissue]
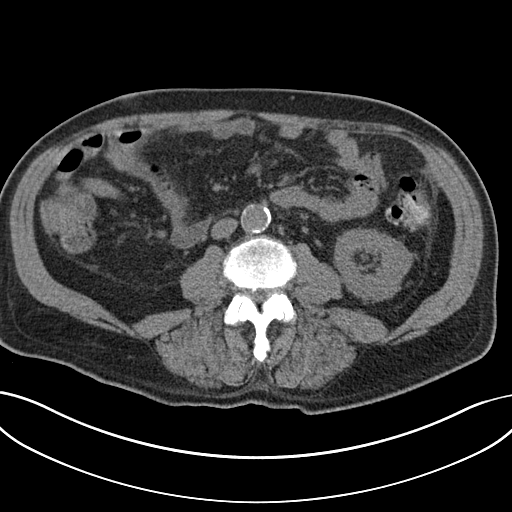
[im 53/58  lung]
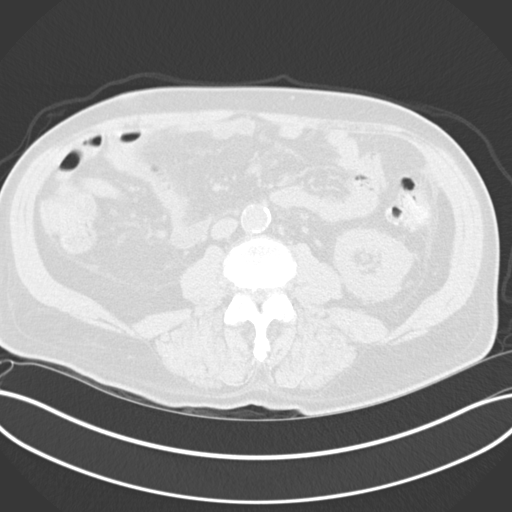

[12 of 32 positions shown; findings below may reference images not displayed]

EXAM:
CT GUIDED CATHETER DRAINAGE OF PERITONEAL ABSCESS

ANESTHESIA/SEDATION:
2.0 mg IV Versed 100 mcg IV Fentanyl

Total Moderate Sedation Time:  20 minutes

The patient's level of consciousness and physiologic status were
continuously monitored during the procedure by Radiology nursing.

PROCEDURE:
The procedure, risks, benefits, and alternatives were explained to
the patient. Questions regarding the procedure were encouraged and
answered. The patient understands and consents to the procedure. A
time out was performed prior to initiating the procedure.

The left lateral abdominal wall was prepped with chlorhexidine in a
sterile fashion, and a sterile drape was applied covering the
operative field. A sterile gown and sterile gloves were used for the
procedure. Local anesthesia was provided with 1% Lidocaine.

CT was performed initially in a supine position followed by a
slightly oblique position with the left side raised. From this
position, an 18 gauge trocar needle was advanced from an
anterolateral approach to the level of the left lower quadrant fluid
collection. After confirming needle tip position, fluid was
aspirated from the needle. A guidewire was advanced into the
collection and the needle removed. The percutaneous tract was
dilated and a 12 French percutaneous drainage catheter advanced over
the guidewire. Catheter position was confirmed by CT. Additional
fluid was aspirated from the catheter and a fluid sample sent for
culture analysis. The drainage catheter was flushed with sterile
saline and attached to a suction bulb. The drain was secured at the
skin with a Prolene retention suture and StatLock device.

COMPLICATIONS:
None
FINDINGS: Very little anterior window was present to the level of the
left-sided upper pelvic peritoneal fluid collection which by CT
today measures approximately 4.3 x 5.3 cm. In a slightly oblique
position with the left side raise, there was a much improved
anterolateral window allowing needle advancement followed by drain
placement. There was return of thick, tan colored fluid. A sample
was sent for culture analysis. After drain placement, there is good
return of fluid with suction bulb drainage.
IMPRESSION: CT-guided percutaneous catheter drainage of peritoneal abscess in
the left upper pelvis with return of thick, tan fluid. A sample was
sent for culture analysis. A 12 French drainage catheter was placed
and attached to suction bulb drainage.

## 2022-02-21 NOTE — Telephone Encounter (Signed)
Please contact pt for future appointment. Pt needing refills.

## 2022-02-21 NOTE — Telephone Encounter (Signed)
Pt's pharmacy is requesting a refill on furosemide 40 mg tablets. Pt was supposed to take this medication for 7 days. Would Dr. Saunders Revel like for the pt to still take this medication? Please address

## 2022-02-22 NOTE — Telephone Encounter (Signed)
[facility] pharmacy contacted to verify furosemide dose. Per Altha Harm, furosemide 40 mg #30 one by mouth daily which was dispensed yesterday. Per Altha Harm, patient has been on this dose monthly dating back to last year. Says patient sometimes fills sooner. Medication profile updated.

## 2022-02-26 ENCOUNTER — Other Ambulatory Visit: Payer: Self-pay | Admitting: Oncology

## 2022-03-05 ENCOUNTER — Other Ambulatory Visit: Payer: Self-pay | Admitting: Internal Medicine

## 2022-03-11 ENCOUNTER — Other Ambulatory Visit: Payer: Self-pay | Admitting: *Deleted

## 2022-03-11 MED ORDER — MORPHINE SULFATE (CONCENTRATE) 10 MG /0.5 ML PO SOLN: 2.5000 mg | ORAL | 0 refills | Status: DC | PRN

## 2022-03-12 ENCOUNTER — Telehealth: Payer: Self-pay | Admitting: *Deleted

## 2022-03-12 NOTE — Telephone Encounter (Signed)
Garrett Peterson with hospice called asking for prescription for steroids to be sent in for patient to stimulate appetite and help with his breathing stating that he is not eating and has a metallic taste in his mouth. Pleas advise

## 2022-03-13 ENCOUNTER — Encounter: Payer: Self-pay | Admitting: Oncology

## 2022-03-13 NOTE — Telephone Encounter (Signed)
VM left yesterday.

## 2022-03-15 ENCOUNTER — Other Ambulatory Visit: Payer: Self-pay | Admitting: Oncology

## 2022-03-18 ENCOUNTER — Telehealth: Payer: Self-pay | Admitting: *Deleted

## 2022-03-18 NOTE — Telephone Encounter (Signed)
Vivien Rota called and has question regarding dexamethasone prescription for appetite because patient is on Prednisone and asking what to do about the 2 Please return her call [phone]

## 2022-03-21 ENCOUNTER — Encounter: Payer: Self-pay | Admitting: Oncology

## 2022-03-22 ENCOUNTER — Other Ambulatory Visit: Payer: Self-pay | Admitting: Internal Medicine

## 2022-03-22 NOTE — Telephone Encounter (Signed)
Please schedule overdue F/U appt for refills. Thank you!

## 2022-03-26 ENCOUNTER — Other Ambulatory Visit: Payer: Self-pay | Admitting: Oncology

## 2022-03-26 ENCOUNTER — Telehealth: Admitting: Internal Medicine

## 2022-03-26 DIAGNOSIS — Z515 Encounter for palliative care: Secondary | ICD-10-CM | POA: Diagnosis not present

## 2022-03-26 DIAGNOSIS — J9611 Chronic respiratory failure with hypoxia: Secondary | ICD-10-CM

## 2022-03-26 DIAGNOSIS — J84112 Idiopathic pulmonary fibrosis: Secondary | ICD-10-CM

## 2022-03-26 DIAGNOSIS — I2723 Pulmonary hypertension due to lung diseases and hypoxia: Secondary | ICD-10-CM | POA: Diagnosis not present

## 2022-03-26 NOTE — Patient Instructions (Addendum)
IPF (idiopathic pulmonary fibrosis) (HCC) WHO group 3 pulmonary arterial hypertension (HCC) Chronic hypoxemic respiratory failure (HCC)  - progressive over time - now 10LL [place] and incrased from 6-8L [place] since last visit -  off esbriet due to GI issues -on Tyvaso DPI and tolerting it well - on morphine prn for dyspnea and pain - on dexamethasone now for appetite and cough/mucs  Plan - Continue dexamethasone via hospice - Continue inhaled Tyvaso dpi full dose - continue lasix 27m twice daily -Contine o2 as before - no more esbiret - for mucus: ask for ipratropium nebulizer and mucinex OTC  Hospice/DNR  - due to progression in cancer  Plan  - per hos[pice   FOllowup - - video vist in 8-12  weeks with Dr RChase Caller

## 2022-03-26 NOTE — Progress Notes (Signed)
OV 03/26/2022  Subjective:  Patient ID: Garrett Peterson, male , DOB: 05-28-1945 , age 76 y.o. , MRN: [id number] , ADDRESS: [place] [id number] PCP Albina Billet, MD Patient Care Team: Albina Billet, MD as PCP - General (Internal Medicine) End, Harrell Gave, MD as PCP - Cardiology (Cardiology) Clent Jacks, RN as Oncology Nurse Navigator Earlie Server, MD as Consulting Physician (Oncology)  This Provider for this visit: Treatment Team:  Attending Provider: Brand Males, MD    03/26/2022 - IPF , resp failure, cancer - hospice patient  Type of visit: Video Virtual Visit Identification of patient Garrett Peterson with Feb 16, 1946 and MRN [id number] - 2 person identifier Risks: Risks, benefits, limitations of telephone visit explained. Patient understood and verbalized agreement to proceed Anyone else on call: grandson [name] Patient location: his home This provider location: [address], [address]; [place]; [place] [address]. [name]. [name] 76 y.o. -since starting morphine constipated . For pain and dyspnea via hospice x 1 months. Now on stool softerner and better. Senna-S was started. PRednisone changed to dexamehtasone 30m for appetite improivement. This has helped cough and mucus. He has had his high dose flu shot. THey want to know -> covid mRNA booster and RSV - I recommended it. Currently on 10L O2 and increased x 2 months. STill on tyvaso dpi. Tolerating it well. 683m.   Last CT FEb 2023 Hospice nurse visits once a week NO in house onc visit No labs work in a while    CT Chest data  No results found.    PFT     Latest Ref Rng & Units 01/10/2022    1:27 PM 04/25/2021    9:21 AM 12/19/2020    9:58 AM 08/02/2020   10:34 AM  PFT Results  FVC-Pre L 1.37  1.51  1.54  1.61   FVC-Predicted Pre % 35  39  39  41   Pre FEV1/FVC % % 88  90  85  96   FEV1-Pre L 1.21  1.36  1.31  1.55    FEV1-Predicted Pre % 44  49  47  55   DLCO uncorrected ml/min/mmHg 7.61  7.61  13.12  12.41   DLCO UNC% % 32  32  55  52   DLCO corrected ml/min/mmHg  7.79  13.12  12.27   DLCO COR %Predicted %  33  55  52   DLVA Predicted % 85  91  91  103        has a past medical history of (HFpEF) heart failure with preserved ejection fraction ([place] Actinic keratosis, Adenomatous polyp of colon, Arthritis, Basal cell carcinoma, CAD (coronary artery disease), Cancer ([place](2021), Hyperlipidemia, Hypertension, Interstitial lung disease ([place] Pneumonia, Pre-diabetes, Respiratory failure with hypoxia ([place] and 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   reports that he has never smoked. He has quit using smokeless tobacco.  His smokeless tobacco use included chew.  Past Surgical History:  Procedure Laterality Date   APPENDECTOMY     BRONCHIAL WASHINGS  05/18/2020   Procedure: BRONCHIAL WASHINGS;  Surgeon: ElMargaretha SeedsMD;  [facility] Service: Pulmonary;;   CARDIAC CATHETERIZATION     PT DENIES   COLONOSCOPY     2011,2015,2021   COLONOSCOPY WITH PROPOFOL N/A 07/10/2020   Procedure: COLONOSCOPY WITH PROPOFOL;  Surgeon: Toledo, TeBenay PikeMD;  Location: [facility];  Service: Gastroenterology;  Laterality: N/A;   CORONARY ARTERY BYPASS GRAFT     FLEXIBLE BRONCHOSCOPY N/A 04/24/2020   Procedure: FLEXIBLE BRONCHOSCOPY;  Surgeon: Ottie Glazier, MD;  Location: [facility];  Service: Thoracic;  Laterality: N/A;   IR SINUS/FIST TUBE CHK-NON GI  10/27/2019   open heart surgery     PORTACATH PLACEMENT Right 10/13/2019   Procedure: INSERTION PORT-A-CATH;  Surgeon: Herbert Pun, MD;  Location: [facility];  Service: General;  Laterality: Right;   RIGHT/LEFT HEART CATH AND CORONARY ANGIOGRAPHY N/A 10/03/2020   Procedure: RIGHT/LEFT HEART CATH AND CORONARY ANGIOGRAPHY;  Surgeon: Nelva Bush, MD;  Location: [facility];  Service: Cardiovascular;  Laterality: N/A;   ROBOT  ASSISTED LAPAROSCOPIC PARTIAL COLECTOMY  09/2019   VIDEO BRONCHOSCOPY N/A 05/18/2020   Procedure: VIDEO BRONCHOSCOPY WITHOUT FLUORO;  Surgeon: Margaretha Seeds, MD;  Location: [facility];  Service: Pulmonary;  Laterality: N/A;    Allergies  Allergen Reactions   Ofev [Nintedanib] Diarrhea and Nausea Only    Immunization History  Administered Date(s) Administered   Influenza, High Dose Seasonal PF 03/19/2021   PFIZER(Purple Top)SARS-COV-2 Vaccination 06/09/2019, 06/30/2019, 01/18/2020   Zoster Recombinat (Shingrix) 01/12/2018, 03/21/2018    Family History  Problem Relation Age of Onset   Lung disease Father      Current Outpatient Medications:    aspirin EC 81 MG tablet, Take 1 tablet (81 mg total) by mouth daily. Swallow whole., Disp: , Rfl:    atorvastatin (LIPITOR) 20 MG tablet, Take 20 mg by mouth daily., Disp: , Rfl:    furosemide (LASIX) 40 MG tablet, Take 1 tablet (40 mg total) by mouth daily., Disp: 30 tablet, Rfl: 0   metoprolol tartrate (LOPRESSOR) 25 MG tablet, Take 25 mg by mouth 2 (two) times daily., Disp: , Rfl:    Morphine Sulfate (MORPHINE CONCENTRATE) 10 mg / 0.5 ml concentrated solution, Take 0.13-0.25 mLs (2.6-5 mg total) by mouth every 2 (two) hours as needed for severe pain or shortness of breath., Disp: 30 mL, Rfl: 0   Pirfenidone (ESBRIET) 267 MG TABS, Take 2 tablets (534 mg total) by mouth in the morning, at noon, and at bedtime., Disp: 540 tablet, Rfl: 3   potassium chloride SA (KLOR-CON M) 20 MEQ tablet, TAKE 1 TABLET BY MOUTH DAILY, Disp: 15 tablet, Rfl: 0   predniSONE (DELTASONE) 5 MG tablet, Take 1 tablet (5 mg total) by mouth daily with breakfast., Disp: 30 tablet, Rfl: 2   temazepam (RESTORIL) 30 MG capsule, Take 30 mg by mouth at bedtime. , Disp: , Rfl:    traMADol (ULTRAM) 50 MG tablet, Take 1 tablet (50 mg total) by mouth every 6 (six) hours as needed., Disp: 30 tablet, Rfl: 0   Treprostinil (TYVASO DPI MAINTENANCE KIT) 64 MCG POWD, Inhale 64 mcg  into the lungs in the morning, at noon, in the evening, and at bedtime., Disp: , Rfl:       Objective:   There were no vitals filed for this visit.  Estimated body mass index is 32.16 kg/m as calculated from the following:   Height as of 06/28/21: _0  (1.753 m).   Weight as of 07/10/21: 217 lb 12.8 oz (98.8 kg).  _1 @  There were no vitals filed for this visit.   Physical Exam look   General: No distress. Looks well. On o2 Neuro: Alert and Oriented x 3. GCS 15. Speech normal Psych: Pleasant        Assessment:       ICD-10-CM  1. IPF (idiopathic pulmonary fibrosis) ([place])  J84.112     2. Chronic hypoxemic respiratory failure (HCC)  J96.11     3. WHO group 3 pulmonary arterial hypertension (HCC)  I27.23     4. Hospice care patient  Z51.5          Plan:     Patient Instructions  IPF (idiopathic pulmonary fibrosis) ([place]) WHO group 3 pulmonary arterial hypertension (HCC) Chronic hypoxemic respiratory failure (HCC)  - progressive over time - now 10LL [place] and incrased from 6-8L [place] since last visit -  off esbriet due to GI issues -on Tyvaso DPI and tolerting it well - on morphine prn for dyspnea and pain - on dexamethasone now for appetite and cough/mucs  Plan - Continue dexamethasone via hospice - Continue inhaled Tyvaso dpi full dose - continue lasix 32m twice daily -Contine o2 as before - no more esbiret - for mucus: ask for ipratropium nebulizer and mucinex OTC  Hospice/DNR  - due to progression in cancer  Plan  - per hos[pice   FOllowup - - video vist in 8-12  weeks with Dr RChase Caller   ( Level 03: Esbt 20-29 min  tal care time and counseling or/and coordination of care by this undersigned MD - Dr MBrand Males This includes one or more of the following all delivered on this same day 03/26/2022: pre-charting, chart review, note writing, documentation discussion of test results, diagnostic or treatment recommendations, prognosis,  risks and benefits of management options, instructions, education, compliance or risk-factor reduction. It excludes time spent by the [place] office staff in the care of the patient. Actual time was 20 mn)  SIGNATURE    Dr. MBrand Males M.D., F.C.C.P,  Pulmonary and Critical Care Medicine Staff Physician, CP[place]gerDirector - Interstitial Lung Disease  Program  [facility] [place] [id number] Pager: [phone] If no answer or between  15:00h - 7:00h: call [phone] Telephone: [phone]  11:38 AM 03/26/2022

## 2022-04-12 ENCOUNTER — Other Ambulatory Visit: Payer: Self-pay

## 2022-04-12 DIAGNOSIS — J9611 Chronic respiratory failure with hypoxia: Secondary | ICD-10-CM

## 2022-04-12 DIAGNOSIS — C20 Malignant neoplasm of rectum: Secondary | ICD-10-CM

## 2022-04-12 MED ORDER — MORPHINE SULFATE (CONCENTRATE) 10 MG /0.5 ML PO SOLN: 2.5000 mg | ORAL | 0 refills | Status: DC | PRN

## 2022-04-12 NOTE — Telephone Encounter (Signed)
Nurse from [place] left message stating this patient needs a refill on morphine sent to [facility] (nurse callback # [phone])  she states that it is roxanol 80m/ml.

## 2022-04-20 ENCOUNTER — Emergency Department
Admission: EM | Admit: 2022-04-20 | Discharge: 2022-04-20 | Disposition: A | Discharge status: Home / Self Care | Attending: Emergency Medicine | Admitting: Emergency Medicine

## 2022-04-20 ENCOUNTER — Other Ambulatory Visit: Payer: Self-pay

## 2022-04-20 ENCOUNTER — Emergency Department

## 2022-04-20 DIAGNOSIS — Y92002 Bathroom of unspecified non-institutional (private) residence single-family (private) house as the place of occurrence of the external cause: Secondary | ICD-10-CM | POA: Diagnosis not present

## 2022-04-20 DIAGNOSIS — S0991XA Unspecified injury of ear, initial encounter: Secondary | ICD-10-CM | POA: Diagnosis present

## 2022-04-20 DIAGNOSIS — Y9389 Activity, other specified: Secondary | ICD-10-CM | POA: Insufficient documentation

## 2022-04-20 DIAGNOSIS — S01311A Laceration without foreign body of right ear, initial encounter: Secondary | ICD-10-CM | POA: Insufficient documentation

## 2022-04-20 DIAGNOSIS — W01198A Fall on same level from slipping, tripping and stumbling with subsequent striking against other object, initial encounter: Secondary | ICD-10-CM | POA: Diagnosis not present

## 2022-04-20 DIAGNOSIS — S0990XA Unspecified injury of head, initial encounter: Secondary | ICD-10-CM | POA: Diagnosis not present

## 2022-04-20 DIAGNOSIS — W19XXXA Unspecified fall, initial encounter: Secondary | ICD-10-CM

## 2022-04-20 MED ORDER — CIPROFLOXACIN HCL 500 MG PO TABS
500.0000 mg | ORAL_TABLET | ORAL | Status: AC
  Administered 2022-04-20: 500 mg via ORAL
  Filled 2022-04-20: qty 1

## 2022-04-20 MED ORDER — LIDOCAINE HCL (PF) 1 % IJ SOLN
10.0000 mL | Freq: Once | INTRAMUSCULAR | Status: AC
  Administered 2022-04-20: 10 mL
  Filled 2022-04-20: qty 10

## 2022-04-20 MED ORDER — CIPROFLOXACIN HCL 500 MG PO TABS: 500.0000 mg | ORAL_TABLET | Freq: Two times a day (BID) | ORAL | 0 refills | Status: AC

## 2022-04-20 NOTE — Consult Note (Signed)
..Garrett Peterson, Garrett Peterson [id number] [birth year] Lavonia Drafts, MD  Reason for Consult: Right ear laceration  HPI: 76 y.o. oxygen dependent Hospice patient fell this morning in his bathroom.  EMS was called and he presented to ER with complex right ear laceration with cartilage exposure and involvement.  He is oxygen dependent at 10L but reports he slipped and did not lose consciousness.  He denies significant pain.  He has chronic hearing issues but no acute change after the fall.  Allergies:  Allergies  Allergen Reactions   Ofev [Nintedanib] Diarrhea and Nausea Only    ROS: Review of systems normal other than 12 systems except per HPI.  PMH:  Past Medical History:  Diagnosis Date   (HFpEF) heart failure with preserved ejection fraction ([place])    a. 05/2020 Echo: EF 55-60%, no rwma, Gr1 DD, mildly red RV fxn; b. 09/2020 RHC: PCWP 69mHg. CO/CI 4.7/2.1.   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 09/2020 Cath: LM 70d, LAD 70ost, 80/710mLCX 100ost/p, 99p/m, RCA 100ost/p, VG->OM2 4042mIMA->LAD nl, VG->RPDA nl-->Med Rx.   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   right scalp/neck postauricular - [facility]    FH:  Family History  Problem Relation Age of Onset   Lung disease Father     SH:  Social History   Socioeconomic History   Marital status: Married    Spouse name: Not on file   Number of children: Not on file   Years of education: Not on file   Highest education level: Not on file  Occupational History   Not on file  Tobacco Use   Smoking status: Never   Smokeless tobacco: Former    Types: CheNurse, children'se: Never used  Substance and Sexual Activity    Alcohol use: Yes    Alcohol/week: 0.0 standard drinks of alcohol    Comment: OCCAS-not within the last year as of 09/27/2020   Drug use: Never   Sexual activity: Not Currently  Other Topics Concern   Not on file  Social History Narrative   Not on file   Social Determinants of Health   Financial Resource Strain: Not on file  Food Insecurity: Not on file  Transportation Needs: Not on file  Physical Activity: Not on file  Stress: Not on file  Social Connections: Not on file  Intimate Partner Violence: Not on file    PSH:  Past Surgical History:  Procedure Laterality Date   APPENDECTOMY     BRONCHIAL WASHINGS  05/18/2020   Procedure: BRONCHIAL WASHINGS;  Surgeon: EllMargaretha SeedsD;  [facility];;   CARDIAC CATHETERIZATION     PT DENIES   COLONOSCOPY     2011,2015,2021   COLONOSCOPY WITH PROPOFOL N/A 07/10/2020   Procedure: COLONOSCOPY WITH PROPOFOL;  Surgeon: Toledo, TeoBenay PikeD;  Location: [facility];  Service: Gastroenterology;  Laterality: N/A;   CORONARY ARTERY BYPASS GRAFT     FLEXIBLE BRONCHOSCOPY N/A 04/24/2020   Procedure: FLEXIBLE BRONCHOSCOPY;  Surgeon: AleOttie GlazierD;  Location: [facility] ORS;  Service: Thoracic;  Laterality: N/A;   IR SINUS/FIST TUBE CHK-NON GI  10/27/2019   open heart surgery     PORTACATH PLACEMENT Right 10/13/2019   Procedure: INSERTION PORT-A-CATH;  Surgeon: Herbert Pun, MD;  Location: [facility] ORS;  Service: General;  Laterality: Right;   RIGHT/LEFT HEART CATH AND CORONARY ANGIOGRAPHY N/A 10/03/2020   Procedure: RIGHT/LEFT HEART CATH AND CORONARY ANGIOGRAPHY;  Surgeon: Nelva Bush, MD;  Location: [facility];  Service: Cardiovascular;  Laterality: N/A;   ROBOT ASSISTED LAPAROSCOPIC PARTIAL COLECTOMY  09/2019   VIDEO BRONCHOSCOPY N/A 05/18/2020   Procedure: VIDEO BRONCHOSCOPY WITHOUT FLUORO;  Surgeon: Margaretha Seeds, MD;  Location: [place];  Service: Pulmonary;  Laterality: N/A;     Physical  Exam:  GEN-  NAD, supine in bed sitting upright NEURO-CN 2-12 grossly intact and symmetric. EARS-  Right external ear with superior laceration extending from tragus superiorly to posterior insertion.  Muscle and cartilage exposure with possible loss of tissue superior anteriorly.  Good color and no evidence of venous congestions Nose- bruising on right side but no mobility or significant tenderness OC/OP- no mass or lesions RESP- unlabored during my exam with patient supine and unmoving CARD-  RR  A/P: Complex laceration to right ear requiring intermediate closure  Plan:  Cipro if cleared for cartilage invovlement.  Instructed wife on wound care with Bacitracin ointment and gauze to protect from Nasal Canula.  Instructed on cleaning with H202 diluted and qtip.  Follow up [place] at [facility] for suture removal.   Carloyn Manner 04/20/2022 8:33 AM

## 2022-04-20 NOTE — Op Note (Signed)
..  04/20/2022  8:39 AM    Garrett Peterson  [id number]   Pre-Op Dx:  complex laceration of right ear  Post-op Dx: same  Proc: 1)  Repair of 5cm laceration of right ear with cartilage exposure with intermediate repair    Surg:  Jeannie Fend Jose Alleyne  Anes:  Local  EBL:  49m  Comp:  none  Findings:  laceration and partial avulsion of superior aspect of right ear from tragus to posterior insertion of conchal bowl.  Good blood flow and successful complete closure.  Procedure: After the patient was identified in ER and the history and physical was reviewed and updated and verbal consent was obtained.  The patient was placed in a supine position.  Local anesthesia of plain 1% Lidocaine was injected 344minto the patient's right ear and temporal skin region.  At this time the patient was prepped and draped in a normal fashion.  The patient's lacerations as described above were thoroughly washed with sterile saline and Chlorhexidine solution.  The wounds were meticulously evaluated and cleaned of any possible foreign material.  Wound edges were evaluated and any necrotic or crushed tissue was carefully debrided.  Once fresh wound edges were ensured, attention was directed to closure of the patient's right ear laceration.  At this time deep Vicryl were places in the fibrofatty subcutaneous tissue on the upper aspect of the ear with the exposed cartilage.  Fortunately no skin was missing and the deep suture was able to re approximate the skin for complete coverage of the cartilage.  This was repeated anterior just above the tragus and posteriorly where the conchal bowl approximates the lateral temporal bone.  The skin was then closed in a normal fashion using intermittent 5.0 Nylon suture for good strong closure.  The wound was copiously cleaned and hemostasis was continued.   Bacitracin ointment was place on the closed laceration sites.  At this time with all lacerations closed, [facility] the patient [facility]  [facility]  Follow up in 6 days for suture removal.  Dispo:   [facility] in stable condition  Plan:  To follow up in one week for suture removal.  Wound care and dressing changes.  CrCarloyn Manner12/01/2022 8:39 AM

## 2022-04-20 NOTE — Discharge Instructions (Addendum)
Please follow up with Dr. Pryor Ochoa in the ENT clinic as recommended.  Take the prescribed antibiotics to try and prevent infection due to your ear laceration.  Continue taking your regular medications as well.  Follow any other instructions given to you by Dr. Pryor Ochoa.  Return to the emergency department if you develop new or worsening symptoms that concern you.

## 2022-04-20 NOTE — ED Notes (Signed)
ENT at bedside

## 2022-04-20 NOTE — ED Triage Notes (Signed)
Pt BIB EMS from home following a mechanical fall, slip on water while walking to the bathroom. Pt presents to ed with laceration to right side of ear. Pt baseline on 10 L nasal cannula. No blood thinner. Denies LOC.

## 2022-04-20 NOTE — ED Notes (Signed)
Pt's laceration to right ear was irrigated. Bleeding controlled. Suture cart at bedside. Pt A&Ox4. Call bell within reach

## 2022-04-20 NOTE — ED Provider Notes (Signed)
[facility] Provider Note    Event Date/Time   First MD Initiated Contact with Patient 04/20/22 0533     (approximate)   History   Fall   HPI  Garrett Peterson is a 76 y.o. male who presents by EMS for evaluation after a fall.  He reports that he went to the bathroom during the night and slipped in some water that was still on the floor near the bathtub.  This caused him to fall and strike the right side of his head and the ear on something near the shower, possibly a shower rod.  He was bleeding profusely from the ear but the bleeding has lessened now with some direct pressure.  He denies losing consciousness.  He is on 10 L of oxygen at baseline but does not feel acutely short of breath.  He has no chest pain or abdominal pain.  He said it was simply a case of his feet slipping out from under him, not an issue of passing out or having any heart problems.  He reports that he does not take any blood thinners.  He uses the supplemental oxygen due to chronic lung disease.  His wife is at bedside and his DNR paperwork is also present.     Physical Exam   Triage Vital Signs: ED Triage Vitals  Enc Vitals Group     BP 04/20/22 0512 (!) 138/58     Pulse Rate 04/20/22 0512 64     Resp 04/20/22 0512 20     Temp 04/20/22 0509 98.1 F (36.7 C)     Temp Source 04/20/22 0509 Oral     SpO2 04/20/22 0512 100 %     Weight 04/20/22 0510 98.4 kg (217 lb)     Height 04/20/22 0510 1.753 m (_0 )     Head Circumference --      Peak Flow --      Pain Score 04/20/22 0510 0     Pain Loc --      Pain Edu? --      Excl. in [place]? --     Most recent vital signs: Vitals:   04/20/22 0632 04/20/22 0738  BP: 137/63 (!) 142/60  Pulse: 85 82  Resp: (!) 22 20  Temp:  98.1 F (36.7 C)  SpO2: 100% 100%     General: Awake, no distress.  Good spirits in spite of the situation, conversant, no evidence of dementia or altered mental status. CV:  Good peripheral perfusion.  Regular  rate and rhythm. Resp:  Normal effort.  Breathing seems to be at baseline.  He is on his chronic 10 L of oxygen by nasal cannula.  Good air movement, no audible wheezing. Abd:  No distention.  Other:  Patient has a complex laceration of the right ear at the superior margin where the earlobe meets the side of his head.  There is a missing piece of skin with exposed cartilage and the laceration extends over the entire superior margin of the earlobe.  See photo below.     ED Results / Procedures / Treatments   Labs (all labs ordered are listed, but only abnormal results are displayed) Labs Reviewed - No data to display    RADIOLOGY See hospital course for details, but I viewed and interpreted the CT head and CT cervical spine and there is no evidence of acute injury.    PROCEDURES:  Critical Care performed: No  Procedures   MEDICATIONS ORDERED  IN ED: Medications  lidocaine (PF) (XYLOCAINE) 1 % injection 10 mL (10 mLs Other Given 04/20/22 0720)  ciprofloxacin (CIPRO) tablet 500 mg (500 mg Oral Given 04/20/22 0719)     IMPRESSION / MDM / ASSESSMENT AND PLAN / ED COURSE  I reviewed the triage vital signs and the nursing notes.                              Differential diagnosis includes, but is not limited to, intracranial hemorrhage, cervical spine injury, skull fracture, complex ear laceration.  Patient's presentation is most consistent with acute presentation with potential threat to life or bodily function.  Labs/studies ordered: CT head, CT cervical spine.    Patient is comfortable in spite of the injury to his right ear.  Was documented below in the hospital course, CT scans are reassuring.  Given the complexity of the ear laceration, he would be best served by a specialist.  I consulted Dr. Pryor Ochoa with ENT and discussed the case.  He agreed to come into the emergency department and see and treat the patient at bedside.  He recommended a course of prophylactic  ciprofloxacin for pseudomonal coverage given the exposed cartilage.  Even though I typically would not prescribe fluoroquinolones particularly to an elderly population, it is appropriate in this case.  I ordered a dose of Cipro 500 mg p.o. and wrote a prescription for 5 days.  Pending ENT consultation and anticipate discharge after laceration repair.  Wife and patient are agreeable with the plan.   Clinical Course as of 04/20/22 0743  Sat Apr 20, 2022  0649 I viewed and interpreted the patient's head CT and cervical spine CT and I see no evidence of acute injury.  Radiology confirms no acute findings. [CF]    Clinical Course User Index [CF] Hinda Kehr, MD     FINAL CLINICAL IMPRESSION(S) / ED DIAGNOSES   Final diagnoses:  Fall, initial encounter  Complex laceration of right ear, initial encounter     Rx / DC Orders   ED Discharge Orders          Ordered    ciprofloxacin (CIPRO) 500 MG tablet  2 times daily        04/20/22 4128             Note:  This document was prepared using Dragon voice recognition software and may include unintentional dictation errors.   Hinda Kehr, MD 04/20/22 [phone]

## 2022-04-20 NOTE — ED Notes (Signed)
Patient transported to CT 

## 2022-04-22 ENCOUNTER — Telehealth: Payer: Self-pay | Admitting: *Deleted

## 2022-04-22 NOTE — Telephone Encounter (Signed)
VM left

## 2022-04-22 NOTE — Telephone Encounter (Signed)
Vivien Rota called reporting that patient fell requiring stitches in his ear. He needs medicine mgmt. His b/p is down, his HR is down to 50's. He has 3+edema in bilateral legs from klnees down. "They look like they are going to pop or start weeping". He is currently taking Lasix 40 mg twice a day and Potassium 20 meg every day. She is asking if you want to diurese him. She also reports that he is on antibiotics for his ear laceration. Please return her call [phone]

## 2022-04-22 NOTE — Telephone Encounter (Signed)
Discussed with Vivien Rota. Recommended obtaining labs: CBC, CMP, and BNP.

## 2022-04-24 ENCOUNTER — Ambulatory Visit: Payer: Medicare Other | Admitting: Nurse Practitioner

## 2022-04-25 ENCOUNTER — Encounter: Payer: Self-pay | Admitting: Hospice and Palliative Medicine

## 2022-04-25 ENCOUNTER — Telehealth: Payer: Self-pay | Admitting: Pharmacist

## 2022-04-25 NOTE — Telephone Encounter (Signed)
Submitted a Prior Authorization RENEWAL request to [facility] for TYVASO DPI via CoverMyMeds. Will update once we receive a response.  [name]   Patient is on hospice but to continue Tyvaso for now. Has discontinued Esbriet.  [name], PharmD, MPH, BCPS, CPP Clinical Pharmacist (Rheumatology and Pulmonology)

## 2022-04-26 NOTE — Telephone Encounter (Signed)
Received notification from [facility] regarding a prior authorization for TYVASO DPI. Authorization has been APPROVED from 04/25/2022 to 05/13/2023. Approval letter sent to scan center.  Patient must fill through [place] (pulmonary fibrosis team). [phone]  Authorization # [id number] Phone: [phone]  [name], PharmD, MPH, BCPS, CPP Clinical Pharmacist (Rheumatology and Pulmonology)

## 2022-04-29 ENCOUNTER — Encounter: Payer: Self-pay | Admitting: Hospice and Palliative Medicine

## 2022-05-14 ENCOUNTER — Other Ambulatory Visit: Payer: Self-pay | Admitting: *Deleted

## 2022-05-14 DIAGNOSIS — J9611 Chronic respiratory failure with hypoxia: Secondary | ICD-10-CM

## 2022-05-14 DIAGNOSIS — C20 Malignant neoplasm of rectum: Secondary | ICD-10-CM

## 2022-05-14 MED ORDER — MORPHINE SULFATE (CONCENTRATE) 10 MG /0.5 ML PO SOLN: 10.0000 mg | ORAL | 0 refills | Status: DC | PRN

## 2022-05-14 NOTE — Telephone Encounter (Signed)
Vivien Rota with hospice called reporting that patient needs refill of his Roxanol, but is requesting to increase his dose to 10 mg every 2 hours as needed as he is declining. Please advise

## 2022-05-21 ENCOUNTER — Telehealth (INDEPENDENT_AMBULATORY_CARE_PROVIDER_SITE_OTHER): Payer: Medicare Other | Admitting: Internal Medicine

## 2022-05-21 DIAGNOSIS — J9611 Chronic respiratory failure with hypoxia: Secondary | ICD-10-CM

## 2022-05-21 DIAGNOSIS — Z66 Do not resuscitate: Secondary | ICD-10-CM

## 2022-05-21 DIAGNOSIS — I2723 Pulmonary hypertension due to lung diseases and hypoxia: Secondary | ICD-10-CM

## 2022-05-21 DIAGNOSIS — J84112 Idiopathic pulmonary fibrosis: Secondary | ICD-10-CM

## 2022-05-21 NOTE — Patient Instructions (Addendum)
IPF (idiopathic pulmonary fibrosis) ([place]) WHO group 3 pulmonary arterial hypertension ([place]) Chronic hypoxemic respiratory failure ([place])  Died May 24, 2022

## 2022-05-21 NOTE — Progress Notes (Signed)
Previous [facility] pulmonary encounter: 07/04/20- Dr. Chase Caller  Referred to the ILD center for comprehensive evaluation by Dr. Rodman Pickle primary pulmonologist 77 year old male, never smoked.  Significant for chronic hypoxic respiratory failure, coronary artery disease, hypertension.  Patient of Dr. Chase Caller. Started OFEV in April 2022.   Garrett Peterson 77 y.o. -very complicated story.  Details of the story is copied and pasted above.  Meeting him for the first time with his wife.  It appears that he was previously healthy as of April 2021.  Although CT scan of the chest at the time of my personal visualization showed presence of early ILD [CT abdomen 10 years prior in 2011 had no ILD].  He was diagnosed with anorectal cancer.  He was undergoing treatment at [facility].  Chemotherapy as above.  Then in the fall/winter 2020 when he started falling ill with respiratory illnesses.  This is all documented above.  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  CT scan of the chest by October 2021 started showing significant amount of groundglass opacities.  He only had ANA test, rheumatoid factor and angiotensin-converting enzyme.  This is all negative.  For serology.  Otherwise has not had any other serology.  He had hypersensitive pneumonitis panel this was negative.  This was at outside facility [facility] clinic pulmonology program.  He has had 2 hospitalizations the most recent one being in January.  It appears that he was treated with prednisone each time.  It is unclear to me as this to him and his wife if he has been on chronic prednisone but reading the notes it appears so.  Currently is just finished a taper starting at 60 mg/day in January and finished at 20 mg and stop.  He says since stopping yesterday or today he is beginning to feel a little bit worse.  Room air oxygen at rest was fine but when he desaturated after he walked more than 2 laps.  However he needed 5 L to correct.  Most recent  echocardiogram was normal.  Most recent lavage showed mostly neutrophils.  Again culture negative.  His most recent CT scan of the chest in February 2022 without contrast shows improvement in groundglass opacities but no fibrosis.  His serologies ANA negative and rheumatoid factor negative and angiotensin converting enzyme negative  Definitely earlier on in the year it was not consistent with UIP and suggested alternate diagnosis.  Unclear to me if he has probable UIP pattern at this point in time in February 2022.  We did not have time to do an extensive detailed ILD questionnaire.  It appears that his goals are to get better and also feel better at the same time control his cancer.  They are also wondering about prognosis.  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  He is currently looking into getting radiation locally.  Oncologist wants to do a colonoscopy.  She feels the recurrence is because all his chemo has been on hold because of respiratory issues.   SErology Dec 2021 - [name]  ANA Direct - [facility] Negative Negative   RA Latex Turbid. - [facility] 0.0 - 13.9 IU/mL <10.0   A.Fumigatus #1 Abs - [facility] Negative Negative  Micropolyspora faeni, IgG - [facility] Negative Negative  Thermoactinomyces vulgaris, IgG - [facility] Negative Negative  A. Pullulans Abs -  [facility] Negative Negative  Thermoact. Saccharii - [facility] Negative Negative  Pigeon Serum Abs - [facility] Negative Negative   Angio Convert Enzyme - [facility] 14 - 82 U/L 26   Walk tst 07/04/2020    94% - ra at rest -> walked 2 laps without desat and dropped to 86% in middle of 3rd lap. Then need 5L [name] to correct to walk 3 laps in office   CT chest 06/23/20   IMPRESSION: 1. 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 Fibrosis has progressed since 03/02/2020 chest CT with resolved consolidative opacities. 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 UIP is difficult to exclude but is less favored. Follow-up high-resolution chest CT suggested in 6-12 months. Findings are indeterminate for UIP per consensus guidelines: Diagnosis of Idiopathic Pulmonary Fibrosis: An Official ATS/ERS/JRS/ALAT Clinical Practice Guideline. [place], Iss 5, [phone], Jan 11 2017. 2. Stable mild cardiomegaly. 3. Aortic Atherosclerosis (ICD10-I70.0).     Electronically Signed   By: [name] M.D.   On: 06/24/2020 15:15      08/02/2020 Patient presents today for 1 month follow-up with spirometry/DLCO. Patient was discussed at ILD conference in March 2022, diagnosed with IPF. Recent exacerbation likely chemo related. Started on Ofev. Patient was referred for right heart cath and referred to pulmonary rehab.  He is currently on an extended prednisone taper. He started 109m prednisone dose today x 2 weeks. He is still taking Bactrim. Breathing has improved. He has not started antifibrotic medication yet, his wifes states that their insurance has improved OFEV medication.   PFT 08/02/2020 - FVC 1.61 (41%), FEV1 1.55 (55%), ratio 96, DLCOcor 12.27 (52%) 09/21/2020 Patient presents today for 1 month follow-up. Accompanied by his son. He stop pulmonary for a week or two since having bronchitis. Cough is better since taking mucinex. He is on 4L nasal cannula. He is having 6-7 loose stools a day. Diarrhea started when he went up to 2 tabs of Ofev. He has up coming hearth cath, date to be determined. Son reports some new leg swelling. Due for LFTs today.   HEart cth  10/03/20  Conclusions: Severe native coronary artery disease including 70% distal LMCA and mid LAD disease, sequential 100% ostial and 99% proximal LCx lesions, and chronic total occlusion of ostial RCA. Widely patent LIMA-LAD and SVG-rPDA. Patent SVG-OM2 with 40% stenosis in proximal/mid portion of SVG. Mildly elevated left heart filling pressure (LVEDP 20 mmHg, PCWP 23 mmHg). Moderately elevated right heart filling pressure (mean RAP 14 mmHg, LVEDP 20 mmHg). Moderate pulmonary hypertension (mean PAP 35 mmHg, PVR 2.6 WU). Mildly reduced cardiac output/index.   Recommendations: Aggressive secondary prevention of coronary artery disease. Initiate furosemide 40 mg PO daily for HFpEF.  BMP to be drawn when patient is seen for follow-up 10/13/2020. Ongoing management of pulmonary fibrosis and pulmonary hypertension per Dr. RChase Caller   CNelva Bush MD [facility]      OV 10/18/2020  Subjective:  Patient ID: PJuliette Peterson male , DOB: 128-Apr-1947, age 77y.o. , MRN: [id number], [place] [id number] TAlbina Billet MD Patient Care Team: TAlbina Billet MD as PCP - General (Internal Medicine) End, CHarrell Gave MD as PCP - Cardiology (Cardiology) SClent Jacks RN as  Oncology Nurse Navigator Earlie Server, MD as Consulting Physician (Oncology)  This Provider for this visit: Treatment Team:  Attending Provider: Brand Males, MD  Type of visit: Telephone/Video Circumstance: COVID-19 national emergency Identification of patient Garrett Peterson with [birth year] and MRN [id number] - 2 person identifier Risks: Risks, benefits, limitations of telephone visit explained. Patient understood and verbalized agreement to proceed Anyone else on call: just patient Patient location: [phone] This provider location: Provider home because provider is isolating due to covid   10/18/2020 -  followup IPF.   HPI Garrett Peterson 78 y.o. - Off ofev since  09/21/20 due to diarrrhea. He called 09/28/20 and we told him to stop ofev.  But now he tells me 10/18/2020 that for 3 weeks he is taking 150m bid. No diarrhea other than going to BR frequently but is tolerabl and small amounts only. Respiration wise he is stable. Starts getting worse after lunch. Still on 4 LNC o2. No change Not on active cancer Rx. Has been cleared. Had [facility]- see ablve. Srated on new lasix and helped breathing somewhat. Is attending pulm rehab  Discussed Include in patients with ILD   - Right heart cath :  PVR > 3, PCWP </= 15, Pmap >/=  25 -  Patient needed to be able to walk 1023m 300 feet on a 43m84mlk test  - he does have exclusionary criteria - his PVR is < 3 and PCWP is  15 but he is interested in applying for tyvasos. Discussed side effect profile briefly       OV 12/19/2020  Subjective:  Patient ID: PhiJuliette Alcideale , DOB: [birth year]age [age]o. , MRN: [id number]: [id number] TatAlbina BilletD Patient Care Team: TatAlbina BilletD as PCP - General (Internal Medicine) End, ChrHarrell GaveD as PCP - Cardiology (Cardiology) StaClent JacksN as Oncology Nurse Navigator Yu,Earlie ServerD as Consulting Physician (Oncology)  This Provider for this visit: Treatment Team:  Attending Provider: RamBrand MalesD    12/19/2020 -   Chief Complaint  Patient presents with   Follow-up    PFT performed today.  Pt states that he has had a lot of postnasal drainage in the mornings. Pt states his breathing is about the same since last visit. Pt is on Tyvaso and states that has been working well.   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 -presents for follow-up with his family member.  Overall he stable.  Symptom scores are stable.  He had pulmonary function test.?  Quality of respiratory maneuver but his FVC shows 4% decline compared to 6 months ago of 5 months ago.  DLCO is stable.  He himself feels stable.  He is on inhaled treprostinil.  He feels inhaled treprostinil since early July 2022 is improving his shortness of breath.  He is using it 4 times daily and each time 7 times.  Plan is to escalate to close to 12 times each time.  He is not doing nintedanib.  He does not want to do nintedanib.  He rechallenged himself again for the second time and had side effects.  He feels is  because his GI motility is different after his anorectal cancer.  He wants to know if he can try any natural treatment.  He is particularly interested in pine pollen.  I told him that I do not know much about this medication but would be open to him trying it.  He also wanted to know what to do about sinus drainage which is the early in the morning for a few hours and is having to sneeze a lot.  His family member asked if he should turn down the humidifier which I said was okay.  Can also try saline nasal spray to clear it.  They wanted know about oxygen use.  He is using 4 L.  I advised him to use it continuously.  Also advised to adjusted for pulse ox goal greater than 88% with exertion.     OV 02/02/2021  Subjective:  Patient ID: Garrett Peterson, male , DOB: 12/29/1945 , age 24 y.o. , MRN: [id number] , ADDRESS: [place]  [id number] PCP Albina Billet, MD Patient Care Team: Albina Billet, MD as PCP - General (Internal Medicine) End, Harrell Gave, MD as PCP - Cardiology (Cardiology) Clent Jacks, RN as Oncology Nurse Navigator Earlie Server, MD as Consulting Physician (Oncology)  This Provider for this visit: Treatment Team:  Attending Provider: Brand Males, MD   Follow-up IPF [exacerbated by radiation therapy  for anal rectal cancer )  -Intolerant to nintedanib spring 2022  -Diagnosis on multidisciplinary case conference 2022  - start esbriet Aug 2022 WHO group 3 pulmonary hypertension -started Tyvaso early November 11, 2020 Chronic hypoxemic respiratory failure  -4 L due to the above   02/02/2021 -   Chief Complaint  Patient presents with   Follow-up    Pt states he is about the same since last visit. Still becomes SOB with exertion.     HPI Garrett Peterson 77 y.o. -presents for follow-up.  He presents with [name] his grandson.  He has completed pulmonary rehabilitation.  They are reporting still 4 L of oxygen use at rest and 5 L with exertion.  He is now on pirfenidone.  He tried to escalate to 3 pills 3 times daily but this caused GI issues and low appetite.  So he is just taking 2 pills 3 times daily.  His grandson states that at pulmonary rehab and 3 L he did desaturate to 83%.  At home on 4 L he will desaturate into the 80s.  But at 5 L he seems okay.  They want a portable oxygen system.  He says that the restricted lifestyle from pulmonary fibrosis is making him more anxious and depressed.  In fact his anxiety and depression scores have gone up.  He feels portable oxygen will help him.  He wants portable oxygen.  I offered mental health counseling but he wants to try portable oxygen first.  He is willing to talk to the support group leader.  He has questions about life expectancy which I told him and the natural history would be few to several years.  The hope is that these antifibrotic's and keep him stable to new medications, long.  Also expressed uncertainty with the course.  He processed all this.  He is frustrated by his disease.  He again had questions about the goal of the inhaled treprostinil which I explained.  At this point in time he is accepted using the oxygen but wants portable.  He will use inhaled  treprostinil and also the pirfenidone.  He will check liver function test today.  Were  not sure of the rehabilitation helped him.  His main purpose right now is to be able to get out of the house and have improvements in his anxiety and depression.  We discussed clinical trials is not eligible because of his cancer history.       OV 03/21/2021  Subjective:  Patient ID: Garrett Peterson, male , DOB: 1946/04/03 , age 1 y.o. , MRN: [id number] , ADDRESS: [place] [id number] PCP Albina Billet, MD Patient Care Team: Albina Billet, MD as PCP - General (Internal Medicine) End, Harrell Gave, MD as PCP - Cardiology (Cardiology) Clent Jacks, RN as Oncology Nurse Navigator Earlie Server, MD as Consulting Physician (Oncology)  This Provider for this visit: Treatment Team:  Attending Provider: Brand Males, MD    03/21/2021 -   Chief Complaint  Patient presents with   Follow-up    Pt states his breathing has become worse since last visit.    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 -returns for follow-up with his grandson [name].  He tells me for the last 10 days or so starting around 03/09/2021 he started noticing increasing shortness of breath with exertion.  He uses 6 L and sometimes evenw with that with ex he drops to 70s  He does ADLs.  He says it takes around 25 minutes for him to take a shower and change his clothes.  4 to 6 weeks ago it used to take 18 minutes or 20 minutes.  Definitely takes longer.  He finds it slower to go to the mailbox and come back but nevertheless he is able to do all his  ADLs.He is tolerating time also quite well taking at all times each time 4 times daily.  No side effects.  He is tolerating pirfenidone at 2 pills 3 times daily.  His latest CT scan of the chest was in February 2022.  His last pulmonary function test was in August 2022.  Noted below that his symptoms were significantly worse.  We did a walking desaturation test and shows even on 8L 150 feet x 3 laps he desaturates   He did have a right-sided neck sub auricular area -skin biopsy on 03/05/2021.  I reviewed the result.  It shows squamous cell carcinoma.  He is waiting a call back from the dermatologist/surgeon.  But he and his grandson seem aware of the result.  He does have energy back up athome    Last CT FEb 2022 with progression of ILd/IPF since Oct 2021 Last PFT aug 2022 with progresion since march 2022 Cardiac cath May 2022 shows slightly reduced EF [January 2022 echocardiogram EF 66%] Not on anticoagulation No worsening edema Last creatinine 0.9 mg percent June 2022 Normal liver function test September 2022 Normal hemoglobin May 2022 last checked on     PFT  OV 04/25/2021  Subjective:  Patient ID: Garrett Peterson, male , DOB: [birth year] ,  age 61 y.o. , MRN: [id number] , ADDRESS: [place] [phone] PCP Albina Billet, MD Patient Care Team: Albina Billet, MD as PCP - General (Internal Medicine) End, Harrell Gave, MD as PCP - Cardiology (Cardiology) Clent Jacks, RN as Oncology Nurse Navigator Earlie Server, MD as Consulting Physician (Oncology)  This Provider for this visit: Treatment Team:  Attending Provider: Brand Males, MD    04/25/2021 -   Chief Complaint  Patient presents with   Follow-up    PFT performed today.  Pt still has complaints of worsening SOB.     HPI Garrett Peterson 77 y.o. -presents with his grandson Donato Schultz to discuss test results. PFT since earlier in the year : 36% decline in DLCO, 6% declne In FVC - avg 21%  decline in early 2022 to current. BUt CT chest deemed stable through 2022 though new onset and definitely worse since 2021. Blood work ok. ECHO - ok. No new issues since last visit. No worsening of symptoms. Last visit adised hm to use 4L [place] rest but increaes to avoid dessaturaion with exertion but he and grandson tell me he is not titrating up. ADvised this could be a safety issue  Conversation then followed on goals of care and expectations      OV 08/15/2021  Subjective:  Patient ID: Garrett Peterson, male , DOB: 02/17/1946 , age 73 y.o. , MRN: [id number] , ADDRESS: [place] [phone] PCP Albina Billet, MD Patient Care Team: Albina Billet, MD as PCP - General (Internal Medicine) End, Harrell Gave, MD as PCP - Cardiology (Cardiology) Clent Jacks, RN as Oncology Nurse Navigator Earlie Server, MD as Consulting Physician (Oncology)  This Provider for this visit: Treatment Team:  Attending Provider: Brand Males, MD  Type of visit: Telephone/Video Circumstance: COVID-19 national emergency Identification of patient Garrett Peterson with 1945-12-04 and MRN [id number] - 2 person identifier Risks: Risks, benefits, limitations of telephone visit explained. Patient understood and verbalized agreement to proceed Anyone else on call: grandson Freddy Jaksch Patient location: his home This provider location: [address]; [place]; [place] [address]. [facility]. [phone]    08/15/2021 -    HPI Garrett Peterson 78 y.o. - no new issues. 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 Reviewed note - recommed liver biopsy but no viable Rx options . Prognosis I 3-6 months. Now x 1 month on hospice.  He says he feels fine/stable. No more weight loss other than 2#. Appetite poor. No pain. On 6L [place]. - 8L [place]. STill on esbriet and tyvaso. Hospice leaving him on esbriet and tyvaso because there is copay fro charity. On side effect - being forced to eat.  No appetite. Unsure if is due to cancer or esbriet or both. On DPI - tyvaso - he likes it. He is wanting to know what would happen to him if he tooks his o2 off. He is satying he is not contemplating removing it intentionally. Just wanted to know his sytmptoms. Explained to him death can happen in minutes to hours. He denies any suicidal intent  Patient very appreciative of care here and became tearful  CT Chest data  No results found.   OV 10/11/2021  Subjective:  Patient ID: Garrett Peterson, male , DOB: [birth year] , age 81 y.o. , MRN: [id number] , ADDRESS: [place] [id number] PCP Albina Billet, MD Patient Care  Team: Albina Billet, MD as PCP - General (Internal Medicine) End, Harrell Gave, MD as PCP - Cardiology (Cardiology) Clent Jacks, RN as Oncology Nurse Navigator Earlie Server, MD as Consulting Physician (Oncology)  This Provider for this visit: Treatment Team:  Attending Provider: Brand Males, MD  Type of visit: Telephone/Video Circumstance: COVID-19 national emergency Identification of patient Garrett Peterson with 1945/10/18 and MRN [id number] - 2 person identifier Risks: Risks, benefits, limitations of telephone visit explained. Patient understood and verbalized agreement to proceed Anyone else on call: grandson Donato Schultz Patient location: his home [place] This provider location: [employer] , [address] [place] [address]; [place]; [place] [address]. [place] [facility]. [phone]    IPF Chronic hypoixemic resp failure Hospice due to cancer  10/11/2021 -     HPI Garrett Peterson 77 y.o. -feels better. Feels he can do more int he house. Less desatruations now . Using 6L at rest.  STopped esbriet due to gi issues. On pred 98m +. On Tyvaso dpi 668m dpi QID. Tolerating it fine. Re his cancer - hospice checking on him qweek. DEnies pain. No idea about progression because he is not going to cancer clinic. Oncology is his primary doc. MAking good  urine. Appetite better. Eating well. Modestly active in hous. Doing ADLs.   OV 01/22/2022  Subjective:  Patient ID: PhJuliette Alcidemale , DOB: 05/1945/07/21 age 77.o. , MRN: [id number] [place]: [id number] TaAlbina BilletMD Patient Care Team: TaAlbina BilletMD as PCP - General (Internal Medicine) End, ChHarrell GaveMD as PCP - Cardiology (Cardiology) StClent JacksRN as Oncology Nurse Navigator YuEarlie ServerMD as Consulting Physician (Oncology)  This Provider for this visit: Treatment Team:  Attending Provider: RaBrand MalesMD  Type of visit: Video Circumstance: x Identification of patient PhSHANDON BURLINGAMEith 1/08-15-1947nd MRN [id number] 2 person identifier Risks: Risks, benefits, limitations of telephone visit explained. Patient understood and verbalized agreement to proceed Anyone else on call: grandson RaDonato Schultzatient location: his home This provider location[address]; [place] [id number]. [phone]    01/22/2022 -  No chief complaint on file.    HPI PhAARAV BURGETT89.o. -on tyvaso dPI full dose. Using 8L [place]. Dyspnea stable. Tolerting tyvaso well. No issue WEight stable. Mucus probles is the main quality of life issue but not on all days. Some days days 7/10 severity. White color. Nose and throat. Not taking mucinex or nasal saline. Does take xyzal. Hospcice nurse has given him nebulizer  - has given him duoneb but he has only used it 3x. Did feel "rough" after dupneb -> jittery. Advised to try to get ipratropium nebulizer alone . Currently still not intereted in esbriet rechallenge due to side effect profile and hospice diagnosis  PFT shows decline esp in past 1 year but last 8 months DLCO stable though FVC decline  Cancer: on hospice at home. Hospice nurse visiting him. Oncologist is hospice attending. Not seen in onc office   OV 03/26/2022  Subjective:  Patient  ID: PhJuliette Alcidemale , DOB: 1/Aug 22, 1945 age 77.o. , MRN: [id number] [place]: [id number] TaAlbina BilletMD Patient Care Team: TaAlbina BilletMD as PCP - General (Internal Medicine) End, ChHarrell GaveMD as PCP - Cardiology (Cardiology) StClent JacksRN as Oncology  Nurse Navigator Earlie Server, MD as Consulting Physician (Oncology)  This Provider for this visit: Treatment Team:  Attending Provider: Brand Males, MD    03/26/2022 - IPF , resp failure, cancer - hospice patient  Type of visit: Video Virtual Visit Identification of patient Garrett Peterson with Jan 08, 1946 and MRN [id number] - 2 person identifier Risks: Risks, benefits, limitations of telephone visit explained. Patient understood and verbalized agreement to proceed Anyone else on call: grandson [name] Patient location: his home This provider location: [address]; [place]; [place] [address]. [place] [place] [facility]. [place] 77 y.o. -since starting morphine constipated . For pain and dyspnea via hospice x 1 months. Now on stool softerner and better. Senna-S was started. PRednisone changed to dexamehtasone 70m for appetite improivement. This has helped cough and mucus. He has had his high dose flu shot. THey want to know -> covid mRNA booster and RSV - I recommended it. Currently on 10L O2 and increased x 2 months. STill on tyvaso dpi. Tolerating it well. 617m.   Last CT FEb 2023 Hospice nurse visits once a week NO in house onc visit No labs work in a while   OVSellers/01/2023  Subjective:  Patient ID: PhJuliette Alcidemale , DOB: 1/Jun 13, 1945 age 89.o. , MRN: [id number] ADDR[address]: [id number] TaAlbina BilletMD Patient Care Team: TaAlbina BilletMD as PCP - General (Internal Medicine) End, ChHarrell GaveMD as PCP - Cardiology (Cardiology) StClent JacksRN as Oncology Nurse Navigator YuEarlie ServerMD as Consulting Physician (Oncology)  This Provider for this visit: Treatment Team:  Attending Provider: RaBrand MalesMD Telephone call  05/21/2022 -  No chief complaint on file.    HPI PhROXIE GUEYE89.o. -called patient at home. His wife answered. SaMichela Pitchere died in hospice 1/January 10, 2024HE went last week into hospice and passed. Expreses my condolences to wife. Passed my condolences through her to grandson RaStanleytownWife and he married [id number]. Randal [phone] grandson -> called 11:30 AM and spoke to him: and passed away9.30pm. Said 2 weeks ago he started having moments of being in outers space -> got worse -> last 10d - completely 'out of it". Went to hospice wed -> died suFebruary 14, 2024    PFT     Latest Ref Rng & Units 01/10/2022    1:27 PM 04/25/2021    9:21 AM 12/19/2020    9:58 AM 08/02/2020   10:34 AM  PFT Results  FVC-Pre L 1.37  1.51  1.54  1.61   FVC-Predicted Pre % 35  39  39  41   Pre FEV1/FVC % % 88  90  85  96   FEV1-Pre L 1.21  1.36  1.31  1.55   FEV1-Predicted Pre % 44  49  47  55   DLCO uncorrected ml/min/mmHg 7.61  7.61  13.12  12.41   DLCO UNC% % 32  32  55  52   DLCO corrected ml/min/mmHg  7.79  13.12  12.27   DLCO COR %Predicted %  33  55  5274 DLVA Predicted % 85  91  91  103        has a past medical history of (HFpEF) heart failure with preserved ejection fraction ([facility] Actinic keratosis, Adenomatous polyp of colon, Arthritis, Basal cell carcinoma, CAD (coronary artery disease), Cancer ([facility]2021),  Hyperlipidemia, Hypertension, Interstitial lung disease ([place]), Pneumonia, Pre-diabetes, Respiratory failure with hypoxia ([place]), and 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   reports that he has never smoked. He has quit using smokeless tobacco.  His smokeless tobacco use included chew.  Past Surgical History:  Procedure Laterality Date   APPENDECTOMY     BRONCHIAL WASHINGS  05/18/2020   Procedure: BRONCHIAL WASHINGS;  Surgeon: Margaretha Seeds, MD;  Location: [facility];  Service: Pulmonary;;   CARDIAC CATHETERIZATION     PT DENIES   COLONOSCOPY     2011,2015,2021   COLONOSCOPY WITH PROPOFOL N/A 07/10/2020   Procedure: COLONOSCOPY WITH PROPOFOL;  Surgeon: Toledo, Benay Pike, MD;  Location: [facility];  Service: Gastroenterology;  Laterality: N/A;   CORONARY ARTERY BYPASS GRAFT     FLEXIBLE BRONCHOSCOPY N/A 04/24/2020   Procedure: FLEXIBLE BRONCHOSCOPY;  Surgeon: Ottie Glazier, MD;  Location: [facility];  Service: Thoracic;  Laterality: N/A;   IR SINUS/FIST TUBE CHK-NON GI  10/27/2019   open heart surgery     PORTACATH PLACEMENT Right 10/13/2019   Procedure: INSERTION PORT-A-CATH;  Surgeon: Herbert Pun, MD;  Location: [facility];  Service: General;  Laterality: Right;   RIGHT/LEFT HEART CATH AND CORONARY ANGIOGRAPHY N/A 10/03/2020   Procedure: RIGHT/LEFT HEART CATH AND CORONARY ANGIOGRAPHY;  Surgeon: Nelva Bush, MD;  Location: [facility];  Service: Cardiovascular;  Laterality: N/A;   ROBOT ASSISTED LAPAROSCOPIC PARTIAL COLECTOMY  09/2019   VIDEO BRONCHOSCOPY N/A 05/18/2020   Procedure: VIDEO BRONCHOSCOPY WITHOUT FLUORO;  Surgeon: Margaretha Seeds, MD;  Location: [facility];  Service: Pulmonary;  Laterality: N/A;    Allergies  Allergen Reactions   Ofev [Nintedanib] Diarrhea and Nausea Only    Immunization History  Administered DatePetersons) Administered   Influenza, High Dose Seasonal PF 03/19/2021   PFIZERPetersonPurple Top)SARS-COV-2 Vaccination 06/09/2019, 06/30/2019, 01/18/2020   Zoster Recombinat (Shingrix) 01/12/2018, 03/21/2018    Family History  Problem Relation Age of Onset   Lung disease Father      Current Outpatient Medications:    aspirin EC 81 MG tablet, Take 1 tablet (81 mg total) by mouth daily. Swallow whole., Disp: , Rfl:    atorvastatin (LIPITOR) 20 MG tablet, Take 20 mg by mouth daily., Disp: , Rfl:    furosemide (LASIX) 40 MG tablet, Take 1 tablet (40 mg total) by mouth daily.  No further refills until seen in office. Thank you!, Disp: 30 tablet, Rfl: 0   metoprolol tartrate (LOPRESSOR) 25 MG tablet, Take 25 mg by mouth 2 (two) times daily., Disp: , Rfl:    Morphine Sulfate (MORPHINE CONCENTRATE) 10 mg / 0.5 ml concentrated solution, Take 0.5 mLs (10 mg total) by mouth every 2 (two) hours as needed for severe pain or shortness of breath., Disp: 30 mL, Rfl: 0   potassium chloride SA (KLOR-CON M) 20 MEQ tablet, TAKE 1 TABLET BY MOUTH DAILY, Disp: 15 tablet, Rfl: 0   predniSONE (DELTASONE) 5 MG tablet, Take 1 tablet (5 mg total) by mouth daily with breakfast., Disp: 30 tablet, Rfl: 2   temazepam (RESTORIL) 30 MG capsule, Take 30 mg by mouth at bedtime. , Disp: , Rfl:    traMADol (ULTRAM) 50 MG tablet, Take 1 tablet (50 mg total) by mouth every 6 (six) hours as needed., Disp: 30 tablet, Rfl: 0   Treprostinil (TYVASO DPI MAINTENANCE KIT) 64 MCG POWD, Inhale 64 mcg into the lungs in the morning, at noon, in the evening, and at bedtime., Disp: , Rfl:  Objective:   There were no vitals filed for this visit.  Estimated body mass index is 32.05 kg/m as calculated from the following:   Height as of 04/20/22: _0  (1.753 m).   Weight as of 04/20/22: 217 lb (98.4 kg).  _1 @  There were no vitals filed for this visit.   Physical Exam      Assessment:       ICD-10-CM   1. IPF (idiopathic pulmonary fibrosis) ([place])  J84.112     2. Chronic hypoxemic respiratory failure (HCC)  J96.11     3. WHO group 3 pulmonary arterial hypertension (HCC)  I27.23     4. DNR (do not resuscitate)  Z66          Plan:     Patient Instructions  IPF (idiopathic pulmonary fibrosis) ([place]) WHO group 3 pulmonary arterial hypertension ([place]) Chronic hypoxemic respiratory failure (HCC)  Died June 16, 2022     SIGNATURE    Dr. Brand Males, M.D., F.C.C.P,  Pulmonary and Critical Care Medicine Staff Physician, [place] Director - Interstitial Lung  Disease  Program  P[facility] [place], [place], [address]  Pager: [phone], If no answer or between  15:00h - 7:00h: call [phone] Telephone: [phone]  11:28 AM 05/21/2022

## 2022-05-23 ENCOUNTER — Encounter: Payer: Self-pay | Admitting: Oncology

## 2022-06-13 DEATH — deceased
